# Patient Record
Sex: Female | Born: 1960 | Race: White | Hispanic: No | Marital: Single | Smoking: Current every day smoker
Health system: Southern US, Community
[De-identification: ages and names within clinical notes are randomized; demographics above are authoritative.]

## PROBLEM LIST (undated history)

## (undated) DIAGNOSIS — F319 Bipolar disorder, unspecified: Secondary | ICD-10-CM

## (undated) DIAGNOSIS — E119 Type 2 diabetes mellitus without complications: Secondary | ICD-10-CM

## (undated) DIAGNOSIS — F259 Schizoaffective disorder, unspecified: Secondary | ICD-10-CM

## (undated) DIAGNOSIS — F419 Anxiety disorder, unspecified: Secondary | ICD-10-CM

## (undated) DIAGNOSIS — J45909 Unspecified asthma, uncomplicated: Secondary | ICD-10-CM

## (undated) DIAGNOSIS — Z923 Personal history of irradiation: Secondary | ICD-10-CM

## (undated) DIAGNOSIS — N809 Endometriosis, unspecified: Secondary | ICD-10-CM

## (undated) DIAGNOSIS — E039 Hypothyroidism, unspecified: Secondary | ICD-10-CM

## (undated) DIAGNOSIS — K219 Gastro-esophageal reflux disease without esophagitis: Secondary | ICD-10-CM

## (undated) DIAGNOSIS — B159 Hepatitis A without hepatic coma: Secondary | ICD-10-CM

## (undated) DIAGNOSIS — J449 Chronic obstructive pulmonary disease, unspecified: Secondary | ICD-10-CM

## (undated) DIAGNOSIS — R7989 Other specified abnormal findings of blood chemistry: Secondary | ICD-10-CM

## (undated) DIAGNOSIS — F4310 Post-traumatic stress disorder, unspecified: Secondary | ICD-10-CM

## (undated) DIAGNOSIS — R7303 Prediabetes: Secondary | ICD-10-CM

## (undated) DIAGNOSIS — D649 Anemia, unspecified: Secondary | ICD-10-CM

## (undated) DIAGNOSIS — I10 Essential (primary) hypertension: Secondary | ICD-10-CM

## (undated) DIAGNOSIS — F909 Attention-deficit hyperactivity disorder, unspecified type: Secondary | ICD-10-CM

## (undated) HISTORY — DX: Type 2 diabetes mellitus without complications: E11.9

## (undated) HISTORY — DX: Anxiety disorder, unspecified: F41.9

## (undated) HISTORY — PX: ECTOPIC PREGNANCY SURGERY: SHX613

## (undated) HISTORY — PX: ABDOMINAL HYSTERECTOMY: SHX81

## (undated) HISTORY — DX: Unspecified asthma, uncomplicated: J45.909

## (undated) HISTORY — DX: Attention-deficit hyperactivity disorder, unspecified type: F90.9

## (undated) HISTORY — DX: Schizoaffective disorder, unspecified: F25.9

---

## 1988-04-06 HISTORY — PX: TOTAL ABDOMINAL HYSTERECTOMY W/ BILATERAL SALPINGOOPHORECTOMY: SHX83

## 1999-02-17 ENCOUNTER — Encounter: Admission: RE | Admit: 1999-02-17 | Discharge: 1999-02-17 | Payer: Self-pay | Admitting: Internal Medicine

## 1999-05-05 ENCOUNTER — Encounter: Admission: RE | Admit: 1999-05-05 | Discharge: 1999-05-05 | Payer: Self-pay | Admitting: Internal Medicine

## 1999-08-04 ENCOUNTER — Encounter: Admission: RE | Admit: 1999-08-04 | Discharge: 1999-08-04 | Payer: Self-pay | Admitting: Internal Medicine

## 1999-10-22 ENCOUNTER — Encounter: Admission: RE | Admit: 1999-10-22 | Discharge: 1999-10-22 | Payer: Self-pay | Admitting: Internal Medicine

## 1999-12-18 ENCOUNTER — Encounter: Admission: RE | Admit: 1999-12-18 | Discharge: 1999-12-18 | Payer: Self-pay | Admitting: Internal Medicine

## 1999-12-23 ENCOUNTER — Encounter: Admission: RE | Admit: 1999-12-23 | Discharge: 1999-12-23 | Payer: Self-pay | Admitting: Hematology and Oncology

## 2000-02-03 ENCOUNTER — Ambulatory Visit (HOSPITAL_COMMUNITY): Admission: RE | Admit: 2000-02-03 | Discharge: 2000-02-03 | Payer: Self-pay | Admitting: Hematology and Oncology

## 2000-03-03 ENCOUNTER — Encounter: Admission: RE | Admit: 2000-03-03 | Discharge: 2000-03-03 | Payer: Self-pay

## 2000-03-08 ENCOUNTER — Encounter: Admission: RE | Admit: 2000-03-08 | Discharge: 2000-03-08 | Payer: Self-pay | Admitting: Internal Medicine

## 2000-04-23 ENCOUNTER — Encounter: Admission: RE | Admit: 2000-04-23 | Discharge: 2000-04-23 | Payer: Self-pay

## 2000-05-12 ENCOUNTER — Encounter: Admission: RE | Admit: 2000-05-12 | Discharge: 2000-05-12 | Payer: Self-pay | Admitting: Hematology and Oncology

## 2000-12-07 ENCOUNTER — Encounter: Admission: RE | Admit: 2000-12-07 | Discharge: 2000-12-07 | Payer: Self-pay | Admitting: Internal Medicine

## 2001-01-20 ENCOUNTER — Encounter: Admission: RE | Admit: 2001-01-20 | Discharge: 2001-01-20 | Payer: Self-pay | Admitting: Internal Medicine

## 2001-10-15 ENCOUNTER — Encounter: Payer: Self-pay | Admitting: Emergency Medicine

## 2001-10-15 ENCOUNTER — Emergency Department (HOSPITAL_COMMUNITY): Admission: EM | Admit: 2001-10-15 | Discharge: 2001-10-15 | Payer: Self-pay | Admitting: Emergency Medicine

## 2001-11-25 ENCOUNTER — Encounter: Admission: RE | Admit: 2001-11-25 | Discharge: 2001-11-25 | Payer: Self-pay | Admitting: Internal Medicine

## 2001-12-02 ENCOUNTER — Encounter: Payer: Self-pay | Admitting: Internal Medicine

## 2001-12-02 ENCOUNTER — Encounter: Admission: RE | Admit: 2001-12-02 | Discharge: 2001-12-02 | Payer: Self-pay | Admitting: Internal Medicine

## 2002-01-03 ENCOUNTER — Encounter: Admission: RE | Admit: 2002-01-03 | Discharge: 2002-01-03 | Payer: Self-pay | Admitting: Internal Medicine

## 2002-03-13 ENCOUNTER — Encounter: Admission: RE | Admit: 2002-03-13 | Discharge: 2002-03-13 | Payer: Self-pay | Admitting: Internal Medicine

## 2002-04-27 ENCOUNTER — Encounter: Payer: Self-pay | Admitting: Internal Medicine

## 2002-04-27 ENCOUNTER — Encounter: Admission: RE | Admit: 2002-04-27 | Discharge: 2002-04-27 | Payer: Self-pay | Admitting: Internal Medicine

## 2002-04-27 ENCOUNTER — Ambulatory Visit (HOSPITAL_COMMUNITY): Admission: RE | Admit: 2002-04-27 | Discharge: 2002-04-27 | Payer: Self-pay | Admitting: Internal Medicine

## 2002-05-15 ENCOUNTER — Encounter: Admission: RE | Admit: 2002-05-15 | Discharge: 2002-05-15 | Payer: Self-pay | Admitting: Internal Medicine

## 2004-04-09 ENCOUNTER — Emergency Department: Payer: Self-pay | Admitting: Emergency Medicine

## 2004-09-11 ENCOUNTER — Emergency Department: Payer: Self-pay | Admitting: Emergency Medicine

## 2004-09-11 ENCOUNTER — Other Ambulatory Visit: Payer: Self-pay

## 2007-01-24 ENCOUNTER — Ambulatory Visit: Payer: Self-pay | Admitting: Cardiology

## 2007-02-08 ENCOUNTER — Ambulatory Visit: Payer: Self-pay | Admitting: Cardiology

## 2008-01-06 ENCOUNTER — Emergency Department: Payer: Self-pay | Admitting: Emergency Medicine

## 2009-07-14 ENCOUNTER — Emergency Department: Payer: Self-pay | Admitting: Internal Medicine

## 2009-12-31 ENCOUNTER — Inpatient Hospital Stay: Payer: Self-pay | Admitting: Psychiatry

## 2011-07-04 ENCOUNTER — Inpatient Hospital Stay: Payer: Self-pay | Admitting: Psychiatry

## 2011-07-04 LAB — COMPREHENSIVE METABOLIC PANEL
Albumin: 3.9 g/dL (ref 3.4–5.0)
Calcium, Total: 8.5 mg/dL (ref 8.5–10.1)
Co2: 27 mmol/L (ref 21–32)
Creatinine: 1 mg/dL (ref 0.60–1.30)
EGFR (African American): >60
EGFR (Non-African Amer.): >60
Glucose: 101 mg/dL — ABNORMAL HIGH (ref 65–99)
Osmolality: 279 (ref 275–301)
SGOT(AST): 28 U/L (ref 15–37)
Sodium: 139 mmol/L (ref 136–145)
Total Protein: 7.7 g/dL (ref 6.4–8.2)

## 2011-07-04 LAB — URINALYSIS, COMPLETE
Blood: NEGATIVE
Glucose,UR: NEGATIVE mg/dL (ref 0–75)
Ketone: NEGATIVE
Leukocyte Esterase: NEGATIVE
Nitrite: NEGATIVE
Ph: 7 (ref 4.5–8.0)
RBC,UR: 1 /HPF (ref 0–5)
Specific Gravity: 1.009 (ref 1.003–1.030)
WBC UR: NONE SEEN /HPF (ref 0–5)

## 2011-07-04 LAB — CBC
HCT: 40 % (ref 35.0–47.0)
MCH: 34.4 pg — ABNORMAL HIGH (ref 26.0–34.0)
MCHC: 33.1 g/dL (ref 32.0–36.0)
MCV: 104 fL — ABNORMAL HIGH (ref 80–100)
RBC: 3.85 10*6/uL (ref 3.80–5.20)
RDW: 13.1 % (ref 11.5–14.5)
WBC: 9 10*3/uL (ref 3.6–11.0)

## 2011-07-04 LAB — ACETAMINOPHEN LEVEL: Acetaminophen: <2 ug/mL

## 2011-07-04 LAB — DRUG SCREEN, URINE
Amphetamines, Ur Screen: NEGATIVE (ref ?–1000)
Cannabinoid 50 Ng, Ur ~~LOC~~: NEGATIVE (ref ?–50)
Cocaine Metabolite,Ur ~~LOC~~: NEGATIVE (ref ?–300)
Methadone, Ur Screen: NEGATIVE (ref ?–300)

## 2011-07-04 LAB — TSH: Thyroid Stimulating Horm: 4.64 u[IU]/mL — ABNORMAL HIGH

## 2011-09-03 ENCOUNTER — Ambulatory Visit: Payer: Self-pay | Admitting: Internal Medicine

## 2011-10-14 LAB — CBC
HCT: 33.5 % — ABNORMAL LOW (ref 35.0–47.0)
HGB: 11.2 g/dL — ABNORMAL LOW (ref 12.0–16.0)
MCH: 36.1 pg — ABNORMAL HIGH (ref 26.0–34.0)
MCHC: 33.4 g/dL (ref 32.0–36.0)
MCV: 108 fL — ABNORMAL HIGH (ref 80–100)
Platelet: 224 10*3/uL (ref 150–440)
RBC: 3.11 10*6/uL — ABNORMAL LOW (ref 3.80–5.20)
RDW: 13.5 % (ref 11.5–14.5)
WBC: 8 10*3/uL (ref 3.6–11.0)

## 2011-10-15 ENCOUNTER — Ambulatory Visit: Payer: Self-pay | Admitting: Obstetrics and Gynecology

## 2011-10-15 LAB — HEMOGLOBIN: HGB: 9.1 g/dL — ABNORMAL LOW (ref 12.0–16.0)

## 2011-10-15 LAB — HEMATOCRIT
HCT: 27.9 % — ABNORMAL LOW (ref 35.0–47.0)
HCT: 23.3 % — ABNORMAL LOW (ref 35.0–47.0)

## 2011-10-16 LAB — CBC WITH DIFFERENTIAL/PLATELET
Basophil #: 0 10*3/uL (ref 0.0–0.1)
Basophil %: 0.4 %
Eosinophil #: 1.2 10*3/uL — ABNORMAL HIGH (ref 0.0–0.7)
Eosinophil %: 13.6 %
HCT: 26.4 % — ABNORMAL LOW (ref 35.0–47.0)
HGB: 9.2 g/dL — ABNORMAL LOW (ref 12.0–16.0)
Lymphocyte #: 4.2 10*3/uL — ABNORMAL HIGH (ref 1.0–3.6)
Lymphocyte %: 46.8 %
MCH: 35.3 pg — ABNORMAL HIGH (ref 26.0–34.0)
MCHC: 34.9 g/dL (ref 32.0–36.0)
MCV: 102 fL — ABNORMAL HIGH (ref 80–100)
Monocyte #: 0.7 x10 3/mm (ref 0.2–0.9)
Monocyte %: 8.4 %
Neutrophil #: 2.7 10*3/uL (ref 1.4–6.5)
Neutrophil %: 30.8 %
Platelet: 134 10*3/uL — ABNORMAL LOW (ref 150–440)
RBC: 2.61 10*6/uL — ABNORMAL LOW (ref 3.80–5.20)
RDW: 16.8 % — ABNORMAL HIGH (ref 11.5–14.5)
WBC: 8.9 10*3/uL (ref 3.6–11.0)

## 2011-11-03 ENCOUNTER — Inpatient Hospital Stay: Payer: Self-pay | Admitting: Obstetrics and Gynecology

## 2011-11-03 LAB — URINALYSIS, COMPLETE
Bilirubin,UR: NEGATIVE
Glucose,UR: NEGATIVE mg/dL (ref 0–75)
Ketone: NEGATIVE
Leukocyte Esterase: NEGATIVE
Nitrite: NEGATIVE
Ph: 6 (ref 4.5–8.0)
RBC,UR: 8 /HPF (ref 0–5)
Squamous Epithelial: <1
WBC UR: 11 /HPF (ref 0–5)

## 2011-11-03 LAB — CBC WITH DIFFERENTIAL/PLATELET
Basophil #: 0.1 10*3/uL (ref 0.0–0.1)
Basophil %: 0.4 %
Eosinophil #: 0.2 10*3/uL (ref 0.0–0.7)
Eosinophil %: 1.3 %
HCT: 28.4 % — ABNORMAL LOW (ref 35.0–47.0)
HGB: 9.5 g/dL — ABNORMAL LOW (ref 12.0–16.0)
Lymphocyte #: 2.8 10*3/uL (ref 1.0–3.6)
Lymphocyte %: 19.2 %
MCH: 33.4 pg (ref 26.0–34.0)
MCHC: 33.3 g/dL (ref 32.0–36.0)
MCV: 100 fL (ref 80–100)
Monocyte #: 1.3 x10 3/mm — ABNORMAL HIGH (ref 0.2–0.9)
Monocyte %: 8.9 %
Neutrophil #: 10.2 10*3/uL — ABNORMAL HIGH (ref 1.4–6.5)
Neutrophil %: 70.2 %
Platelet: 368 10*3/uL (ref 150–440)
RBC: 2.83 10*6/uL — ABNORMAL LOW (ref 3.80–5.20)
RDW: 15.4 % — ABNORMAL HIGH (ref 11.5–14.5)
WBC: 14.6 10*3/uL — ABNORMAL HIGH (ref 3.6–11.0)

## 2011-11-03 LAB — COMPREHENSIVE METABOLIC PANEL
Albumin: 2.4 g/dL — ABNORMAL LOW (ref 3.4–5.0)
Alkaline Phosphatase: 270 U/L — ABNORMAL HIGH (ref 50–136)
Anion Gap: 6 — ABNORMAL LOW (ref 7–16)
BUN: 8 mg/dL (ref 7–18)
Bilirubin,Total: 0.3 mg/dL (ref 0.2–1.0)
Calcium, Total: 8.5 mg/dL (ref 8.5–10.1)
Chloride: 102 mmol/L (ref 98–107)
Co2: 30 mmol/L (ref 21–32)
Creatinine: 0.86 mg/dL (ref 0.60–1.30)
EGFR (African American): >60
EGFR (Non-African Amer.): >60
Glucose: 101 mg/dL — ABNORMAL HIGH (ref 65–99)
Osmolality: 274 (ref 275–301)
Potassium: 3.5 mmol/L (ref 3.5–5.1)
SGOT(AST): 49 U/L — ABNORMAL HIGH (ref 15–37)
SGPT (ALT): 83 U/L — ABNORMAL HIGH
Sodium: 138 mmol/L (ref 136–145)
Total Protein: 7 g/dL (ref 6.4–8.2)

## 2011-11-03 LAB — WET PREP, GENITAL

## 2011-11-03 LAB — TSH: Thyroid Stimulating Horm: 0.219 u[IU]/mL — ABNORMAL LOW

## 2011-11-03 LAB — PROTIME-INR: Prothrombin Time: 13.2 secs (ref 11.5–14.7)

## 2011-11-04 LAB — CBC WITH DIFFERENTIAL/PLATELET
Basophil #: 0.1 10*3/uL (ref 0.0–0.1)
Basophil %: 0.6 %
Eosinophil #: 0.4 10*3/uL (ref 0.0–0.7)
Eosinophil %: 4.2 %
HGB: 9.3 g/dL — ABNORMAL LOW (ref 12.0–16.0)
Lymphocyte %: 30.6 %
MCHC: 34.7 g/dL (ref 32.0–36.0)
Neutrophil %: 53.7 %
RBC: 2.66 10*6/uL — ABNORMAL LOW (ref 3.80–5.20)
RDW: 16 % — ABNORMAL HIGH (ref 11.5–14.5)
WBC: 9.7 10*3/uL (ref 3.6–11.0)

## 2011-11-05 LAB — COMPREHENSIVE METABOLIC PANEL
Alkaline Phosphatase: 222 U/L — ABNORMAL HIGH (ref 50–136)
Anion Gap: 8 (ref 7–16)
Bilirubin,Total: 0.2 mg/dL (ref 0.2–1.0)
Chloride: 103 mmol/L (ref 98–107)
Co2: 30 mmol/L (ref 21–32)
Creatinine: 0.96 mg/dL (ref 0.60–1.30)
EGFR (African American): >60
EGFR (Non-African Amer.): >60
Osmolality: 279 (ref 275–301)
Potassium: 3.8 mmol/L (ref 3.5–5.1)
Sodium: 141 mmol/L (ref 136–145)

## 2011-11-05 LAB — CBC WITH DIFFERENTIAL/PLATELET
Basophil #: 0 10*3/uL (ref 0.0–0.1)
Basophil %: 0.6 %
Eosinophil #: 0.5 10*3/uL (ref 0.0–0.7)
HCT: 25.6 % — ABNORMAL LOW (ref 35.0–47.0)
Lymphocyte #: 2.2 10*3/uL (ref 1.0–3.6)
Lymphocyte %: 28.2 %
MCHC: 34.8 g/dL (ref 32.0–36.0)
MCV: 101 fL — ABNORMAL HIGH (ref 80–100)
Monocyte #: 0.8 x10 3/mm (ref 0.2–0.9)
Monocyte %: 10.2 %
Neutrophil #: 4.3 10*3/uL (ref 1.4–6.5)
Platelet: 316 10*3/uL (ref 150–440)
RBC: 2.54 10*6/uL — ABNORMAL LOW (ref 3.80–5.20)
RDW: 15.7 % — ABNORMAL HIGH (ref 11.5–14.5)

## 2011-11-05 LAB — URINE CULTURE

## 2011-11-06 LAB — CBC WITH DIFFERENTIAL/PLATELET
Basophil #: 0 10*3/uL (ref 0.0–0.1)
Basophil %: 0.5 %
Eosinophil #: 0.7 10*3/uL (ref 0.0–0.7)
Lymphocyte #: 2.5 10*3/uL (ref 1.0–3.6)
MCH: 34 pg (ref 26.0–34.0)
MCHC: 33.7 g/dL (ref 32.0–36.0)
MCV: 101 fL — ABNORMAL HIGH (ref 80–100)
Monocyte %: 10.8 %
Neutrophil #: 3 10*3/uL (ref 1.4–6.5)
RBC: 2.64 10*6/uL — ABNORMAL LOW (ref 3.80–5.20)
RDW: 15.8 % — ABNORMAL HIGH (ref 11.5–14.5)
WBC: 6.9 10*3/uL (ref 3.6–11.0)

## 2011-11-07 LAB — CBC WITH DIFFERENTIAL/PLATELET
Basophil #: 0 10*3/uL (ref 0.0–0.1)
Basophil %: 0.6 %
Eosinophil #: 0.6 10*3/uL (ref 0.0–0.7)
HGB: 9 g/dL — ABNORMAL LOW (ref 12.0–16.0)
Lymphocyte #: 2.9 10*3/uL (ref 1.0–3.6)
Lymphocyte %: 39.4 %
MCHC: 34.1 g/dL (ref 32.0–36.0)
MCV: 101 fL — ABNORMAL HIGH (ref 80–100)
Neutrophil #: 2.9 10*3/uL (ref 1.4–6.5)
Neutrophil %: 39.7 %
Platelet: 353 10*3/uL (ref 150–440)
RDW: 15.7 % — ABNORMAL HIGH (ref 11.5–14.5)

## 2011-11-17 ENCOUNTER — Emergency Department: Payer: Self-pay | Admitting: Emergency Medicine

## 2013-02-06 ENCOUNTER — Encounter: Payer: Self-pay | Admitting: Family Medicine

## 2013-02-16 ENCOUNTER — Encounter: Payer: Self-pay | Admitting: Family Medicine

## 2013-03-06 ENCOUNTER — Encounter: Payer: Self-pay | Admitting: Family Medicine

## 2013-04-06 ENCOUNTER — Encounter: Payer: Self-pay | Admitting: Family Medicine

## 2013-05-30 ENCOUNTER — Encounter: Payer: Self-pay | Admitting: Family Medicine

## 2013-06-04 ENCOUNTER — Encounter: Payer: Self-pay | Admitting: Family Medicine

## 2013-07-05 ENCOUNTER — Encounter: Payer: Self-pay | Admitting: Family Medicine

## 2013-08-07 ENCOUNTER — Inpatient Hospital Stay: Payer: Self-pay | Admitting: Psychiatry

## 2013-08-07 LAB — DRUG SCREEN, URINE
AMPHETAMINES, UR SCREEN: NEGATIVE (ref ?–1000)
Barbiturates, Ur Screen: NEGATIVE (ref ?–200)
Benzodiazepine, Ur Scrn: NEGATIVE (ref ?–200)
COCAINE METABOLITE, UR ~~LOC~~: NEGATIVE (ref ?–300)
Cannabinoid 50 Ng, Ur ~~LOC~~: POSITIVE (ref ?–50)
MDMA (Ecstasy)Ur Screen: NEGATIVE (ref ?–500)
METHADONE, UR SCREEN: NEGATIVE (ref ?–300)
Opiate, Ur Screen: NEGATIVE (ref ?–300)
Phencyclidine (PCP) Ur S: NEGATIVE (ref ?–25)
TRICYCLIC, UR SCREEN: NEGATIVE (ref ?–1000)

## 2013-08-07 LAB — CBC
HCT: 33.8 % — ABNORMAL LOW (ref 35.0–47.0)
HGB: 11.5 g/dL — ABNORMAL LOW (ref 12.0–16.0)
MCH: 35.9 pg — ABNORMAL HIGH (ref 26.0–34.0)
MCHC: 34 g/dL (ref 32.0–36.0)
MCV: 106 fL — ABNORMAL HIGH (ref 80–100)
PLATELETS: 281 10*3/uL (ref 150–440)
RBC: 3.2 10*6/uL — AB (ref 3.80–5.20)
RDW: 13.1 % (ref 11.5–14.5)
WBC: 11.5 10*3/uL — AB (ref 3.6–11.0)

## 2013-08-07 LAB — URINALYSIS, COMPLETE
BILIRUBIN, UR: NEGATIVE
GLUCOSE, UR: NEGATIVE mg/dL (ref 0–75)
KETONE: NEGATIVE
NITRITE: POSITIVE
Ph: 5 (ref 4.5–8.0)
Protein: NEGATIVE
RBC,UR: 11 /HPF (ref 0–5)
SPECIFIC GRAVITY: 1.01 (ref 1.003–1.030)
Squamous Epithelial: 2
WBC UR: 101 /HPF (ref 0–5)

## 2013-08-07 LAB — COMPREHENSIVE METABOLIC PANEL
ALBUMIN: 2.8 g/dL — AB (ref 3.4–5.0)
AST: 13 U/L — AB (ref 15–37)
Alkaline Phosphatase: 97 U/L
Anion Gap: 4 — ABNORMAL LOW (ref 7–16)
BUN: 14 mg/dL (ref 7–18)
Bilirubin,Total: 0.5 mg/dL (ref 0.2–1.0)
CO2: 29 mmol/L (ref 21–32)
Calcium, Total: 8.6 mg/dL (ref 8.5–10.1)
Chloride: 102 mmol/L (ref 98–107)
Creatinine: 1.56 mg/dL — ABNORMAL HIGH (ref 0.60–1.30)
GFR CALC AF AMER: 43 — AB
GFR CALC NON AF AMER: 37 — AB
Glucose: 123 mg/dL — ABNORMAL HIGH (ref 65–99)
Osmolality: 272 (ref 275–301)
Potassium: 3.6 mmol/L (ref 3.5–5.1)
SGPT (ALT): 19 U/L (ref 12–78)
Sodium: 135 mmol/L — ABNORMAL LOW (ref 136–145)
Total Protein: 7 g/dL (ref 6.4–8.2)

## 2013-08-07 LAB — ETHANOL
Ethanol %: <0.003 % (ref 0.000–0.080)
Ethanol: <3 mg/dL

## 2013-08-07 LAB — BASIC METABOLIC PANEL
Anion Gap: 8 (ref 7–16)
BUN: 15 mg/dL (ref 7–18)
CREATININE: 1.52 mg/dL — AB (ref 0.60–1.30)
Calcium, Total: 8.2 mg/dL — ABNORMAL LOW (ref 8.5–10.1)
Chloride: 104 mmol/L (ref 98–107)
Co2: 27 mmol/L (ref 21–32)
EGFR (African American): 44 — ABNORMAL LOW
EGFR (Non-African Amer.): 38 — ABNORMAL LOW
GLUCOSE: 94 mg/dL (ref 65–99)
OSMOLALITY: 278 (ref 275–301)
Potassium: 3.8 mmol/L (ref 3.5–5.1)
Sodium: 139 mmol/L (ref 136–145)

## 2013-08-07 LAB — SALICYLATE LEVEL: SALICYLATES, SERUM: 2.7 mg/dL

## 2013-08-07 LAB — ACETAMINOPHEN LEVEL: Acetaminophen: <2 ug/mL

## 2013-08-10 LAB — URINE CULTURE

## 2013-08-12 LAB — URINALYSIS, COMPLETE
Bilirubin,UR: NEGATIVE
GLUCOSE, UR: NEGATIVE mg/dL (ref 0–75)
Ketone: NEGATIVE
Nitrite: NEGATIVE
PH: 6 (ref 4.5–8.0)
Protein: NEGATIVE
RBC,UR: 3 /HPF (ref 0–5)
SPECIFIC GRAVITY: 1.004 (ref 1.003–1.030)
WBC UR: 22 /HPF (ref 0–5)

## 2013-08-22 ENCOUNTER — Emergency Department: Payer: Self-pay | Admitting: Emergency Medicine

## 2013-09-06 DIAGNOSIS — F17200 Nicotine dependence, unspecified, uncomplicated: Secondary | ICD-10-CM | POA: Insufficient documentation

## 2013-09-06 DIAGNOSIS — I10 Essential (primary) hypertension: Secondary | ICD-10-CM | POA: Insufficient documentation

## 2013-09-06 DIAGNOSIS — I739 Peripheral vascular disease, unspecified: Secondary | ICD-10-CM | POA: Insufficient documentation

## 2013-09-06 DIAGNOSIS — F319 Bipolar disorder, unspecified: Secondary | ICD-10-CM | POA: Insufficient documentation

## 2013-11-28 ENCOUNTER — Observation Stay: Payer: Self-pay | Admitting: Internal Medicine

## 2013-11-28 LAB — COMPREHENSIVE METABOLIC PANEL
ALK PHOS: 131 U/L — AB
ALT: 32 U/L
AST: 34 U/L (ref 15–37)
Albumin: 3.2 g/dL — ABNORMAL LOW (ref 3.4–5.0)
Anion Gap: 7 (ref 7–16)
BUN: 16 mg/dL (ref 7–18)
Bilirubin,Total: 0.2 mg/dL (ref 0.2–1.0)
CREATININE: 1.39 mg/dL — AB (ref 0.60–1.30)
Calcium, Total: 8.2 mg/dL — ABNORMAL LOW (ref 8.5–10.1)
Chloride: 107 mmol/L (ref 98–107)
Co2: 26 mmol/L (ref 21–32)
EGFR (African American): 49 — ABNORMAL LOW
EGFR (Non-African Amer.): 43 — ABNORMAL LOW
GLUCOSE: 85 mg/dL (ref 65–99)
Osmolality: 280 (ref 275–301)
Potassium: 4 mmol/L (ref 3.5–5.1)
SODIUM: 140 mmol/L (ref 136–145)
TOTAL PROTEIN: 7.2 g/dL (ref 6.4–8.2)

## 2013-11-28 LAB — CBC WITH DIFFERENTIAL/PLATELET
Basophil #: 0.1 10*3/uL (ref 0.0–0.1)
Basophil %: 0.9 %
EOS ABS: 0.9 10*3/uL — AB (ref 0.0–0.7)
Eosinophil %: 10.9 %
HCT: 35.9 % (ref 35.0–47.0)
HGB: 12.1 g/dL (ref 12.0–16.0)
LYMPHS PCT: 50 %
Lymphocyte #: 4.3 10*3/uL — ABNORMAL HIGH (ref 1.0–3.6)
MCH: 36.4 pg — AB (ref 26.0–34.0)
MCHC: 33.8 g/dL (ref 32.0–36.0)
MCV: 108 fL — ABNORMAL HIGH (ref 80–100)
Monocyte #: 0.7 x10 3/mm (ref 0.2–0.9)
Monocyte %: 8.3 %
Neutrophil #: 2.6 10*3/uL (ref 1.4–6.5)
Neutrophil %: 29.9 %
PLATELETS: 239 10*3/uL (ref 150–440)
RBC: 3.34 10*6/uL — AB (ref 3.80–5.20)
RDW: 12.8 % (ref 11.5–14.5)
WBC: 8.6 10*3/uL (ref 3.6–11.0)

## 2013-11-29 LAB — BASIC METABOLIC PANEL
ANION GAP: 10 (ref 7–16)
BUN: 18 mg/dL (ref 7–18)
CHLORIDE: 105 mmol/L (ref 98–107)
CO2: 23 mmol/L (ref 21–32)
CREATININE: 1.46 mg/dL — AB (ref 0.60–1.30)
Calcium, Total: 8.6 mg/dL (ref 8.5–10.1)
GFR CALC AF AMER: 46 — AB
GFR CALC NON AF AMER: 40 — AB
Glucose: 165 mg/dL — ABNORMAL HIGH (ref 65–99)
OSMOLALITY: 281 (ref 275–301)
Potassium: 3.9 mmol/L (ref 3.5–5.1)
Sodium: 138 mmol/L (ref 136–145)

## 2013-11-29 LAB — CBC WITH DIFFERENTIAL/PLATELET
BASOS PCT: 0 %
Basophil #: 0 10*3/uL (ref 0.0–0.1)
EOS ABS: 0 10*3/uL (ref 0.0–0.7)
EOS PCT: 0 %
HCT: 37.5 % (ref 35.0–47.0)
HGB: 12.2 g/dL (ref 12.0–16.0)
Lymphocyte #: 1.4 10*3/uL (ref 1.0–3.6)
Lymphocyte %: 7.7 %
MCH: 35.7 pg — AB (ref 26.0–34.0)
MCHC: 32.7 g/dL (ref 32.0–36.0)
MCV: 109 fL — ABNORMAL HIGH (ref 80–100)
MONOS PCT: 1.6 %
Monocyte #: 0.3 x10 3/mm (ref 0.2–0.9)
NEUTROS ABS: 16.4 10*3/uL — AB (ref 1.4–6.5)
NEUTROS PCT: 90.7 %
PLATELETS: 253 10*3/uL (ref 150–440)
RBC: 3.43 10*6/uL — ABNORMAL LOW (ref 3.80–5.20)
RDW: 12.8 % (ref 11.5–14.5)
WBC: 18.1 10*3/uL — AB (ref 3.6–11.0)

## 2014-06-15 DIAGNOSIS — Z85820 Personal history of malignant melanoma of skin: Secondary | ICD-10-CM | POA: Insufficient documentation

## 2014-07-24 NOTE — Discharge Summary (Signed)
PATIENT NAME:  Patricia Good, Patricia Good MR#:  [id number] DATE OF BIRTH:  1961-03-09  DATE OF ADMISSION:  11/03/2011 DATE OF DISCHARGE:  11/07/2011  HOSPITAL COURSE: 54 year old gravida 1, para 1 patient admitted through the Emergency Department for an infected vaginal cuff abscess. The patient had a traumatic laceration encountered with intercourse two weeks previous with repair in the operating room. The patient presented to the Emergency Department with lower abdominal pain.  CT scan demonstrates a 7 x 6 cm vaginal cuff mass. The patient's white blood count was elevated and the patient had a fever to 102. The patient was admitted and started on Zosyn q. 6 hours. Hospital day #1 the patient was taken to the operating room and the mass was drained. At the time of operation it was felt that this mass was more consistent with hematoma with secondary infection. Foley bulb catheter was placed into the cavity and drained for the next two days. The patient defervesced after the procedure and the patient continued on IV Zosyn. The patient was afebrile for the next three days postoperative and white blood count normalized.  On the day of discharge, white blood count 7.3, hematocrit of 26.3, and platelets 353. The patient's pain dissipated. She was tolerating a regular diet on discharge. She will be discharged to home in good condition.   DISCHARGE MEDICATIONS:  1. Augmentin 875 mg 1 tablet twice a day for 10 days.  2. Septra DS 1 tablet twice a day for 10 days.   FOLLOWUP: The patient will follow up with Dr. Ouida Sills on August 13 or before if she develops fever or increasing abdominal pain.   ____________________________ Boykin Nearing, MD tjs:bjt D: 11/07/2011 07:57:37 ET T: 11/07/2011 14:40:49 ET JOB#: [id number]  cc: Boykin Nearing, MD, <Dictator> Boykin Nearing MD ELECTRONICALLY SIGNED 11/08/2011 12:31

## 2014-07-24 NOTE — Op Note (Signed)
PATIENT NAME:  Patricia Good, Patricia Good MR#:  [id number] DATE OF BIRTH:  June 14, 1960  DATE OF PROCEDURE:  11/04/2011  PREOPERATIVE DIAGNOSIS: Vaginal cuff abscess.   POSTOPERATIVE DIAGNOSIS: Infected vaginal cuff hematoma.   PROCEDURE: Incision and drainage of vaginal cuff hematoma.   SURGEON: Laverta Baltimore, MD  ANESTHESIA: General LMA.   INDICATIONS: This is a 54 year old gravida 1, para 0, a patient admitted from the Emergency Department with a vaginal cuff abscess status post traumatic laceration two weeks previously with repair. The patient was febrile and had elevated white blood count.   DESCRIPTION OF PROCEDURE: After adequate general LMA anesthesia, the patient was placed in the dorsal supine position with the legs in the candy-cane stirrups. Lower abdomen, perineum and vaginal prep was performed with Betadine. Straight catheter yielded 100 mL of clear urine. A bulging firm mass was noted at the tear repair site. An 18-gauge spinal needle was placed into the mass with 3 mL of non-clotted dark blood. This was submitted for aerobic and anaerobic cultures. Rectal examination was performed demonstrating the extent of the mass, an area just inferior to the repair site. The previous repair site was injected with 1% lidocaine with epinephrine. A stab incision was made with a 15 blade scalpel and Metzenbaum scissors advanced into the mass. A small amount of discharge resulted. Loculations were broken up as best as possible and a Foley catheter was advanced into the wall and balloon was inflated. This will be kept in for two days. There were no complications. Estimated blood loss 20 mL.           Intraoperative fluids 400 mL. The patient was taken to the recovery room in good condition. The patient did receive her Zosyn dose that  was previously scheduled at admission time.  ____________________________ Boykin Nearing, MD tjs:slb D: 11/04/2011 12:18:10 ET T: 11/04/2011 12:46:24  ET JOB#: [id number]  cc: Boykin Nearing, MD, <Dictator> Boykin Nearing MD ELECTRONICALLY SIGNED 11/06/2011 16:59

## 2014-07-28 NOTE — H&P (Signed)
PATIENT NAME:  Patricia Good, Patricia Good MR#:  [id number] DATE OF BIRTH:  29-Jul-1960  DATE OF ADMISSION:  11/28/2013  REFERRING PHYSICIAN: Marjean Donna, MD   PRIMARY CARE PHYSICIAN: Dr. Rutherford Guys at [facility]: Tongue swelling, shortness of breath, dysphagia.   HISTORY OF PRESENT ILLNESS: This is a 54 year old female with known history of COPD, hypothyroidism, tobacco abuse, depression and hypertension who presents with the above-mentioned complaint. The patient reports she was at her usual state of health and yesterday evening around 8:00 she started to develop tongue swelling, shortness of breath and difficulty swallowing as well. Reports that she did wait for a couple hours to see if it resolves by itself where it did not an she called EMS. By presentation, the patient was noticed to have significant tongue swelling where she was given IV Solu-Medrol and IV Pepcid in the ED. The patient has significant improvement in her tongue swelling but it still persisted and she still reports some mild shortness of breath with this. She is saturating well on room air at this point. The patient is on lisinopril. Reports she was started on it last year, she never had any problems with it. She denies any cough, any productive sputum, any fever, chest pain, shortness of breath. Denies starting any new medications recently. The patient does not have any lip swelling. It was only tongue swelling.   PAST MEDICAL HISTORY: Hypertension, depression, COPD, hypothyroidism.   PAST SURGICAL HISTORY: Hysterectomy surgery.   SOCIAL HISTORY: The patient smokes 1/2 pack per day. Lives at home. No alcohol. No illicit drug use.   FAMILY HISTORY: Significant for diabetes and hypertension in her father.   ALLERGIES: SULFA DRUGS AND WE WILL ADD ACE INHIBITOR to her allergies.   HOME MEDICATIONS: 1.  Naproxen 500 mg oral 2 times a day. 2.  Tramadol 50 mg every 6 hours as needed. 3.  Zestril 40 mg oral  daily. 4.  Prozac 60 mg oral daily. 5.  Trazodone 100 mg oral at bedtime.  6.  Abilify 20 mg oral daily. 7.  Valtrex 500 mg oral daily. 8.  Albuterol as needed.  9.  Spiriva 18 mcg inhalation daily. 10.  Q-Var 1 puff b.i.d. 11.  Norvasc 10 mg oral daily. 12.  Synthroid 137 mcg oral daily.   REVIEW OF SYSTEMS:  CONSTITUTIONAL: Denies fever, chills, fatigue, weakness.  EYES: Denies blurry vision, double vision or inflammation.  EARS, NOSE, THROAT: Denies tinnitus, ear pain, hearing loss, epistaxis. The patient reports tongue swelling. RESPIRATORY: Denies cough, wheezing, hemoptysis. Reports shortness of breath.  CARDIOVASCULAR: Denies any chest pain, orthopnea, edema, palpitations.  GASTROINTESTINAL: Denies nausea, vomiting, diarrhea, abdominal pain, hematemesis.  GENITOURINARY: Denies dysuria, hematuria, renal colic.  ENDOCRINE: Denies polyuria, polydipsia, heat or cold intolerance.  HEMATOLOGY: Denies anemia, easy bruising, bleeding diathesis.  INTEGUMENT: Denies acne, rash, or skin lesion.  MUSCULOSKELETAL: Denies any gout, cramps, arthritis. NEUROLOGIC: Denies CVA, TIA, vertigo, tremors, ataxia.  PSYCHIATRIC: Denies insomnia, substance abuse, alcohol abuse. Reports history of depression and bipolar disorder.   PHYSICAL EXAMINATION: VITAL SIGNS: Temperature 98.2, pulse 68, respiratory rate 18, blood pressure 112/51, saturating 97% on oxygen.  GENERAL: Well-nourished female who looks comfortable in bed, in no apparent distress.  HEENT: Head atraumatic, normocephalic. Pupils equal and reactive to light. Pink conjunctivae. Anicteric sclerae. No nasal discharge or bleed. Has moderate tongue swelling, but I cannot see the back of her throat. There is no pharyngeal erythema. No oral thrush.  NECK: Supple. No thyromegaly.  No JVD. Trachea is midline. No lymphadenopathy. No stridor. CHEST: No chest pain on palpation.  LUNGS: Good air entry bilaterally. No wheezing, rales, rhonchi. No use of  accessory muscles.  HEART: S1, S2 heard. No rubs, murmurs or gallops. PMI nondisplaced.  ABDOMEN: Soft, nontender, nondistended. Bowel sounds present. No rebound. No guarding.  EXTREMITIES: No edema. No clubbing. No cyanosis. Pedal pulses +2 bilaterally.  PSYCHIATRIC: Appropriate affect. Awake and alert x3. Intact judgment and insight.  NEUROLOGIC: Cranial nerves grossly intact. Motor 5/5. No focal deficits. Sensation is symmetrical and intact to light touch.  MUSCULOSKELETAL: No joint effusion or erythema.   PERTINENT DIAGNOSTIC DATA: Glucose 85, BUN 16, creatinine 1.39, sodium 140, potassium 4, chloride 107, CO2 26, ALT 32, AST 34, alk phos 131. White blood cell 8.6, hemoglobin 12.1, hematocrit 35.9, platelets 239,000.   ASSESSMENT AND PLAN: 1.  Acute angioedema. The patient will be started on IV Solu-Medrol and IV Benadryl. We will monitor on telemetry. Will keep on continuous pulse oximetry. She will be kept n.p.o., except medications, until her swelling improves. We will add ACE inhibitor to her allergies. The patient was instructed not to take any ACE inhibitors or any ARBs.  2.  Hypertension. Will resume her back on her home medication, but will hold her lisinopril. 3.  Tobacco abuse. The patient was counseled.  4.  Chronic obstructive pulmonary disease. Has no active wheezing. We will continue her back on her home medication.  5.  Depression. Resume back on home medications.  6.  Hypothyroidism. Continue on Synthroid. 7.  Deep vein thrombosis prophylaxis. Subcutaneous heparin.   CODE STATUS: FULL code.   TOTAL TIME SPENT ON ADMISSION AND PATIENT CARE: 55 minutes. ____________________________ Albertine Patricia, MD dse:sb D: 11/28/2013 04:16:02 ET T: 11/28/2013 07:34:25 ET JOB#: [id number]  cc: Albertine Patricia, MD, <Dictator> Asusena Sigley Graciela Husbands MD ELECTRONICALLY SIGNED 11/30/2013 0:46

## 2014-07-28 NOTE — H&P (Signed)
PATIENT NAME:  Patricia Good, CULLIFER MR#:  [id number] DATE OF BIRTH:  03/09/1961  DATE OF ADMISSION:  08/07/2013  REFERRING PHYSICIAN: Emergency Room MD   ATTENDING PHYSICIAN: Orson Slick, MD  IDENTIFYING DATA: The patient is a 54 year old female with history of mood instability and psychosis.   CHIEF COMPLAINT: "I have blades in my stomach."   HISTORY OF PRESENT ILLNESS:  The patient has a long history of mental illness. She has been hospitalized numerous times since the age of 31.  This is her [facility] admission.  She usually arrives in the hospital complaining of physical symptoms to later be realized that they are delusions. The patient has belief that something is wrong with her stomach.  It has a very thin lining and there are some blades that she swallowed many years ago accidentally hurting her even more. When well, she can function independently. When she becomes psychotic, she usually ends up in the hospital unable to take care of herself, frightened, and she frequently has feelings of [facility] overcoming her body. She hears hallucinations but usually is unable to make the words out and her decompensation is almost always under stress. She has been a patient of Dr. Timmothy Euler for numerous years now and has been compliant with medication. She thinks that medicines are working well. She denies an inconsistency or medication changes. She denies substance abuse. She believes that recent relocation to a rooming house created a stress that is unbearable, and not long ago, she used to live in a small independent apartment but was unable to afford it. There is some conflict at the rooming house with other residents. She thinks that they behave strangely.  She reportedly went to court on the day of admission to press charges against one of the residents. In the Emergency Room, she was rather disorganized and made very little sense talking about her neighbors as she believe that  there are spirits in the house and in general does not want to return there. She is thinking of being placed in an assisted living facility, but she requires that this is a place where she can come and go at will and will not be bothered.  She endorses some symptoms of depression with poor sleep, anhedonia, feelings of guilt, hopelessness, worthlessness, poor energy and concentration. At times, however, she feels hyper with racing thoughts, pressured speech, and religious preoccupations. She denies any recent substance use.   PAST PSYCHIATRIC HISTORY: First hospitalization at the age of 76. She was hospitalized numerous times at [facility] while younger. She usually is somatic when admitted to the hospital and oftentimes hyperreligious. There was a period of time when she abused drugs but does not do it anymore. She is uncertain of a suicide attempt, but per her chart, she overdosed on pills several times in the past. She is a patient of Dr. Timmothy Euler at [place]. She is very satisfied with the care she is getting there.   FAMILY PSYCHIATRIC HISTORY: Has an aunt with diagnosis of bipolar disorder.   PAST MEDICAL HISTORY: COPD, hypothyroidism, a UTI, hypertension, GERD, osteoporosis with bone pain.   ALLERGIES: SULFA DRUGS.   MEDICATIONS ON ADMISSION: Valtrex 500 mg daily, Protonix 40 mg daily, lisinopril 40 mg daily, Norvasc 10 mg daily, trazodone 100 mg at bedtime, Spiriva daily, Synthroid 0.125 mg daily, Prozac 60 mg daily, Cipro 250 q. 12 hours for 3 days, Abilify 20 mg at bedtime, albuterol inhaler as needed.   SOCIAL HISTORY:  She dropped out in 10th grade. She is originally from [place]. She got her GED. She has been married 5 or 6 times. She relocated to [facility] 3-4 years ago. 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 Her mother lives here, sometimes she stays with the mother, lives independently, or in a rooming house. She is disabled. She has  Medicare. She applied for Medicaid and is hoping to get it shortly.  She would like to be placed in an assisted living facility.   REVIEW OF SYSTEMS:  CONSTITUTIONAL: No fevers or chills. No weight changes.  EYES: No double or blurred vision.  EARS, NOSE, AND THROAT:  No hearing loss.  RESPIRATORY: No shortness of breath or cough.  CARDIOVASCULAR: No chest pain or orthopnea.  GASTROINTESTINAL: No abdominal pain, nausea, vomiting, or diarrhea.  GENITOURINARY: No incontinence or frequency.  ENDOCRINE: No heat or cold intolerance.  LYMPHATIC: No anemia or easy bruising.  INTEGUMENTARY: No acne or rash.  MUSCULOSKELETAL: No muscle or joint pain.  NEUROLOGIC: No tingling or weakness.   PSYCHIATRIC: See history of present illness for details.   PHYSICAL EXAMINATION:  VITAL SIGNS: Blood pressure 127/73, pulse 70, respirations 20, temperature 98.6.  GENERAL: This is a well-developed female in no acute distress.  HEENT: The pupils are equal, round, and reactive to light. Sclerae anicteric.  NECK: Supple. No thyromegaly.  LUNGS: Clear to auscultation. No dullness to percussion.  HEART: Regular rhythm and rate. No murmurs, rubs, or gallops.  ABDOMEN: Soft, nontender, nondistended. Positive bowel sounds.  MUSCULOSKELETAL: Normal muscle strength in all extremities.  SKIN: No rashes or bruises.  LYMPHATIC: No cervical adenopathy.  NEUROLOGIC: Cranial nerves II-XII are intact.   LABORATORY DATA: Chemistries are within normal limits with elevated creatinine of 1.56 and blood alcohol level zero. LFTs within normal limits. Urine toxicology screen positive for cannabinoids. CBC: White blood count 11.5, hemoglobin 11.5, hematocrit 33.8, platelets 281,000.  Urinalysis is suggestive of urinary tract infection with a positive urine culture. Serum acetaminophen and salicylates are low.   MENTAL STATUS EXAMINATION ON ADMISSION: The patient is alert and oriented to person, place, time, and somewhat to  situation. She came to the Emergency Room with physical complaints and feeling dehydrated, which she indeed was, and hurting from osteoporosis and blades in her stomach. She accepted transfer to psychiatric bay readily. She recognized me from previous admissions. She is pleasant, polite and cooperative. She maintains good eye contact. She is well groomed and casually dressed. Her speech is of normal rhythm, rate, and volume. Mood is depressed with labile affect. Thought process is illogical. She is paranoid and delusional. She is hallucinating. Her cognition is grossly intact but difficult to assess today. She is of normal intelligence and normal fund of knowledge.  She can register 3/3 and recall 2/3 objects after 5 minutes. She is a poor historian as her insight and judgment are questionable.   SUICIDE RISK ASSESSMENT ON ADMISSION: This is a patient with a long history of depression, psychosis, mood instability, and suicide attempts, who came to the hospital psychotic and in the context of new social stressors. She is at increased risk of suicide.   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  AXIS II: Deferred.  AXIS III: History of COPD, hypertension, osteoporosis.  AXIS IV: Mental and physical illness, financial, and conflict with neighbors.  AXIS V: Global assessment of functioning 25.   PLAN: The patient was  admitted to [facility] for safety, stabilization, and medication management. She was initially placed on suicide precautions and was closely monitored for any unsafe behaviors. She underwent full psychiatric assessment. She received pharmacotherapy, individual and group psychotherapy, substance abuse counseling, and support from therapeutic milieu.  1. Suicidal ideation: The patient denies.  2. Mood and psychosis: We will continue her current regimen. For now, I will contact Dr. Timmothy Euler.  3. Medical: We will  continue antihypertensives, Synthroid, and inhalers.   DISPOSITION: It is unclear if the patient can be placed. She has not enough resources. We will order [facility] consultation.     ____________________________ Wardell Honour Bary Leriche, MD jbp:dd D: 08/08/2013 17:44:07 ET T: 08/08/2013 18:15:52 ET JOB#: [id number]  cc: Leory Allinson B. Bary Leriche, MD, <Dictator> Clovis Fredrickson MD ELECTRONICALLY SIGNED 08/31/2013 7:18

## 2014-07-28 NOTE — Discharge Summary (Signed)
PATIENT NAME:  Patricia Good, Patricia Good MR#:  [id number] DATE OF BIRTH:  [birth year]  DATE OF ADMISSION:  11/28/2013 DATE OF DISCHARGE:  11/29/2013  PRIMARY CARE PHYSICIAN: Abby D. Bender MD  FINAL DIAGNOSES:  1.  Angioedema.  2.  Hypertension.  3.  Chronic obstructive pulmonary disease.  4.  Chronic kidney disease.  5.  Depression.  6.  Gastroesophageal reflux disease.   MEDICATIONS ON DISCHARGE: Include trazodone 100 mg at bedtime, Abilify 20 mg daily, Valtrex 500 mg daily, ProAir HFA 2 puffs every 4 hours as needed for shortness of breath, Spiriva 1 inhalation daily, Norvasc 10 mg daily, ferrous fumarate 65 mg daily, Prilosec 40 mg daily, Qvar 80 mcg/inhalation 1 puff twice a day, Prozac 60 mg daily, Synthroid 137 mcg daily, prednisone 10 mg start with 60 and taper by 10 daily until complete, Benadryl as needed for itching and allergic reaction. Stop taking Zestril. Stop taking tramadol. Stop taking Naprosyn.   DIET: Low sodium diet, regular consistency.  FOLLOWUP: One to 2 weeks with Dr. Rutherford Guys.   HOSPITAL COURSE: The patient was admitted as an observation 11/28/2013 and discharged 11/29/2013. Came in with swelling of the tongue, some shortness of breath and dysphagia, was admitted with angioedema, started on IV Solu-Medrol and IV Benadryl.   LABORATORY AND RADIOLOGICAL DATA DURING THE HOSPITAL COURSE: Included a glucose of 85, BUN 16, creatinine 1.39, sodium 140, potassium 4.0, chloride 107, CO2 of 26, calcium 8.2. Liver function tests: Alkaline phosphatase 131, ALT 32, AST 34. White blood cell count 8.6, H and H 12.1 and 35.9, platelet count of 239,000. On discharge, white count 18.1, hemoglobin 12.2, platelet count 253,000. Creatinine 1.46   HOSPITAL COURSE PER PROBLEM LIST:  1.  Angioedema: This had improved. Tongue swelling had improved. No problems breathing or swallowing on discharge. Prednisone taper. I believe this is probably secondary to the lisinopril, classic angioedema with the  ACE inhibitors. DO NOT TAKE ACE INHIBITORS, THIS WAS LISTED AS AN ALLERGY. It could also be secondary to the recently started tramadol and Naprosyn, I did stop both of these medications also. The patient was much improved upon discharge, stable for discharge.  2.  Hypertension: Kept on usual medications minus lisinopril. Blood pressure 146/86 upon discharge.  3.  Chronic obstructive pulmonary disease: The patient did have some wheezing. Tobacco abuse, smoking cessation counseling done 3 minutes by me. Prednisone should help out with the wheezing.  4.  Chronic kidney disease stage 3: No improvement with IV fluid hydration.  5.  Depression: Continue all psychiatric medications.  6.  Gastroesophageal reflux disease: On Prilosec.  7.  Hypothyroidism: On Synthroid.  TIME SPENT ON DISCHARGE: Thirty-five minutes.   ____________________________ Tana Conch. Leslye Peer, MD rjw:TT D: 11/29/2013 15:01:53 ET T: 11/29/2013 23:20:17 ET JOB#: [id number]  cc: Tana Conch. Leslye Peer, MD, <Dictator> Abby D. Rebeca Alert MD Marisue Brooklyn MD ELECTRONICALLY SIGNED 12/08/2013 15:08

## 2014-07-28 NOTE — Consult Note (Signed)
Brief Consult Note: Diagnosis: Schizoaffective disorder bipolar type.   Patient was seen by consultant.   Recommend further assessment or treatment.   Orders entered.   Comments: Will admit to psychiatry.  Electronic Signatures: Orson Slick (MD)  (Signed [phone] 20:54)  Authored: Brief Consult Note   Last Updated: 04-May-15 20:54 by Orson Slick (MD)

## 2014-07-28 NOTE — Discharge Summary (Signed)
PATIENT NAME:  Patricia Good, Patricia Good MR#:  [id number] DATE OF BIRTH:  October 28, 1960  DATE OF ADMISSION:  11/28/2013 DATE OF DISCHARGE:  11/29/2013  PRIMARY CARE PHYSICIAN:  Mliss Fritz MD   FINAL DIAGNOSES: 1.  Angioedema.  2.  Hypertension.  3.  Chronic obstructive pulmonary disease.  4.  Chronic kidney disease.  5.  Depression.  6.  Gastroesophageal reflux disease.  7.  Tobacco abuse.   MEDICATIONS ON DISCHARGE: Include trazodone 100 mg at bedtime for insomnia, Abilify 20 mg daily, Valtrex 500 mg once a day to prevent outbreak, ProAir HFA 2 puffs every 4 hours as needed for shortness of breath, Spiriva 1 inhalation daily, Norvasc 10 mg daily, ferrous fumarate, 65 mg once a day.    ____________________________ Tana Conch. Leslye Peer, MD rjw:nt D: 11/29/2013 14:25:10 ET T: 11/29/2013 23:21:22 ET JOB#: [id number]  cc: Tana Conch. Leslye Peer, MD, <Dictator> Abby D. Rebeca Alert MD Marisue Brooklyn MD ELECTRONICALLY SIGNED 12/08/2013 15:08

## 2014-07-29 NOTE — Discharge Summary (Signed)
PATIENT NAME:  Patricia Good, Patricia Good MR#:  [id number] DATE OF BIRTH:  03/07/1961  DATE OF ADMISSION:  07/04/2011 DATE OF DISCHARGE:  07/07/2011  HOSPITAL COURSE: See dictated history and physical for details of admission. This 54 year old woman with a past history of psychotic disorder was admitted through the Emergency Room where she presented with somatic complaints about her eyes. It did not appear that there was necessarily anything wrong with her eyes but because of her history of psychosis, she was referred to psychiatry and admitted. In the time that I saw her she was continued on her usual outpatient psychiatric medicine. She denied to me that she was having any auditory or visual hallucinations. Did not appear to be bizarre or disorganized. Affect and mood were stable. Denies any suicidal or homicidal ideation. Generally appeared to be at her baseline. Tolerating medications well. The patient was continued on medication and we made sure that she had follow-up arranged in the community. She was engaged in groups and activities and participated appropriately. Insight seemed adequate. She was discharged without any complication with an agreement that she follow up in the community for outpatient mental health. She resolved any anxiety that she had about the place that she was living. She agreed that she would go back home to stay with her mother temporarily. It appeared that perhaps the living situation she had been in might not have been very stable and this may have been a reason for her being a little bit disorganized in the Emergency Room.   LABORATORY RESULTS: Urinalysis unremarkable. Drug screen negative. TSH slightly elevated at 4.64. Salicylates elevated at 5.2, MCV elevated at 104 but red blood cell count, hemoglobin and hematocrit all normal as is the white blood cell count. Alcohol undetectable. Chemistry panel just showed a glucose of 101.   DISCHARGE MEDICATIONS:  1. Polytrim ophthalmic drops one  both eyes q.3h. for infection. 2. Prozac 60 mg a day.  3. Zestril 20 mg b.i.d.  4. Zantac 150 mg twice a day.  5. Nexium 40 mg per day.  6. Combivent inhaler 2 puffs every 12 hours with spacer. 7. Valtrex 500 mg every 12 hours.  8. Abilify 10 mg per day.  9. Synthroid 150 mcg per day.  10. Desyrel 100 mg at night.   MENTAL STATUS EXAM: Neatly dressed and groomed. Good eye contact. Normal psychomotor activity. Slightly blunted affect, but not bizarrely so. Mood stated as fine. Thoughts appear lucid and directed. No evidence of loosening of associations. No delusional thinking evident. Denies any suicidal or homicidal ideation and reports optimism about her discharge plan and living situation and agrees to follow-up in the community.   DISPOSITION: Follow-up in the community with outpatient psychiatric provider as previously.   DIAGNOSES PRINCIPLE AND PRIMARY:  AXIS I: Psychosis, not otherwise specified, rule out paranoid schizophrenia.   SECONDARY DIAGNOSES:  AXIS I: No further.   AXIS II: Deferred.   AXIS III: High blood pressure, hypothyroid, herpes, gastric reflux symptoms, conjunctivitis.   AXIS IV: Moderate to severe from needing a new living situation, some limitation to her support in the community.   AXIS V: Functioning at time of discharge 42.   ____________________________ Gonzella Lex, MD jtc:rbg D: 07/20/2011 13:09:36 ET T: 07/20/2011 15:23:44 ET JOB#: [id number]  cc: Gonzella Lex, MD, <Dictator> Gonzella Lex MD ELECTRONICALLY SIGNED 07/21/2011 11:45

## 2014-07-29 NOTE — H&P (Signed)
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 The patient is separated from her sixth marriage. She has been married since October 2012. The patient has been living at a rooming house for the past two weeks and prior to that she lived with her mother. The patient comes for inpatient hospitalization on psychiatry at [facility] with chief complaint "my eyes are itching, my right eye is bleeding, I have an infection, I need help and so I brought myself to the Emergency Room".  HISTORY OF PRESENT ILLNESS: According to information obtained from the chart, the patient has  redness of eyes from constant rubbing and likely with polyethylene and it is moderately cleared. It was thought in the Emergency Room that the patient was having hallucinations though patient denies hearing voices or seeing things and said she has not been hearing voices or seeing things now but she did so in the past.   When the patient was asked when she last felt well, she reported two weeks ago. She has been living with her mother. She has constant conflicts with her mother and she moved in at the rooming house. However, she believes that other people at the rooming house are using crack and she got scared and she realized that she needs help and so she came for help.   PAST PSYCHIATRIC HISTORY: History of inpatient hospitalization on psychiatry about four or five times so far. First inpatient hospitalization on psychiatry was at age 76 years at [facility]. She was inpatient for bizarre behavior of hearing voices and seeing things. She was inpatient for seven days. Longest period of inpatient hospitalization on psychiatry was for two to three months at [facility] for hearing voices and  seeing things. Last inpatient hospitalization on psychiatry was at [facility] from 12/31/2009 to 01/06/2010 when she was admitted for depression and psychosis. The patient reports that she tried to hurt herself by taking overdose on pills on a few occasions. Currently she is being followed by Dr. Lenn Cal at [place]. Last appointment was sometime ago; next appointment is 07/10/2011.   The patient has been discharged on the following medications on 01/06/2010 from [place]:  1. Synthroid 0.125 mg daily. 2. Effexor XR 150 mg daily. 3. Zantac 150 mg twice a day. 4. Navane 2 mg twice a day.  She admits that she's been compliant with these medications and takes medications as prescribed.   FAMILY HISTORY OF MENTAL ILLNESS: Has an aunt who was diagnosed with bipolar disorder. No known history of suicides in the family.  FAMILY HISTORY: Raised by parents. Father was a truck Geophysicist/field seismologist. Father died at age 29. The patient does not know the cause of death. Mother was a stay at home mother. Mother is living and 65 years old. Has two sisters and one brother. Close to one sister and one brother and talks to them three times a week.  PERSONAL HISTORY: Born in [place], [place]. Moved to [place] two years ago. Dropped out in the 10th grade because of personal problems. Got GED later. Went to [employer] for CNA course and she got her certificate.   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 This job lasted for 1-1/2 years. Quit because she had to move to help her brother and sister-in-law. 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 This was three years ago. Quit this job because of back problems and hurting too much with osteoarthritis.   MILITARY HISTORY: None.  MARRIAGES: Married six times. Longest marriage was 1-1/2 years. All the marriages ended because of infidelity on the part of  her husbands. No children.   ALCOHOL AND DRUGS: Denies drinking alcohol. Denies street or prescription drug abuse. Does admit smoking nicotine cigarettes at a rate of 10 to 15 per day for many years.   MEDICAL HISTORY: Has hypertension. No known history of diabetes mellitus. Status post hysterectomy. Status post oophorectomy. Status post surgery for melanoma on the skin of the face. No history of motor vehicle accident. Never been unconscious. Has hypothyroidism and is on medication for the same.   ALLERGIES: No known drug allergies.  PRIMARY CARE PHYSICIAN: She is being followed at [facility] and last appointment was a month ago. Next appointment is coming up in one week.   PHYSICAL EXAMINATION:  VITAL SIGNS: Temperature 96.2, pulse 72 per minute and regular, respiratory rate 18 per minute and regular, blood pressure 160/80 mmHg.  HEENT: Head is normocephalic and atraumatic. Eyes are red, superficial abrasions, injection. There is a corneal abrasion noted. Eyes appear injected. EOMS visualized. Tympanic membranes visualized. Pupils equal, round, and reactive to light and accommodation. Fundi bilaterally benign.   NECK: Supple without any organomegaly or thyromegaly.  CHEST: Normal expansion. Normal breath sounds heard.  HEART: Normal S1, S2 without any murmurs or gallops.  ABDOMEN: Soft. No organomegaly. Bowel sounds heard.   RECTAL AND PELVIC: Deferred.   NEUROLOGIC: Gait is normal. Romberg negative. Cranial nerves II through XII grossly intact. Deep tendon reflexes 2+. Plantars are normal response.   MENTAL STATUS EXAMINATION: The patient is dressed in hospital clothes. Alert and oriented to place, person, and time. Fully aware of the situation that brought her for inpatient at [facility]. Denies feeling depressed and sad, "not now". Denies feeling hopeless, helpless. Denies feeling worthless or useless. Does admit that sometimes she feels life is not worth living because of her living  condition which is not very helpful and she wishes she was in a different situation. No auditory or visual hallucinations but she said that in the past she did hear voices and saw things. Denies paranoid or suspicious ideas except that people that she lives with at the rooming house use crack and this really bothers her a great deal. Memory is intact for recent and remote events. General knowledge and information is fair for level of education. Could spell the word world forward and backward without any problems. She could count money. Recall is good and she could remember all the three objects asked for one and three minutes. Does admit to sleep and appetite disturbance because of the living situation. Insight and judgment guarded.  IMPRESSION: AXIS I: 1. 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 2. Nicotine dependence.  AXIS II: Personality disorder, not otherwise specified.   AXIS III: 1. Hypothyroidism. 2. Status post hysterectomy. 3. Status post oophorectomy. 4. Status post surgery on the face for melanoma. 5. Rule out conjunctivitis with corneal abrasion.  AXIS IV: Severe. Living situation not helpful, occupational, financial, conflicts with mother with whom she has been living, very poor family support, very  poor social support.  AXIS V: GAF 25.  PLAN: The patient is admitted to [facility] for close observation, evaluation, and help. She will be started back on the same medications. During the stay in the hospital, her medications will be adjusted so that she will not be depressed and will not be paranoid. She has already started on medication for her corneal abrasion and hopefully this will start healing. If not, then ophthalmic consult will be requested. During the stay in the hospital, she will be given milieu therapy and supportive counseling. She will take part in individual and group therapy when she is ready to do so at which time coping  skills will be addressed. At the time of discharge, Social Services will look into appropriate living arrangement so that she does not decompensate. Appropriate follow-up appointments will be made.   ____________________________ Wallace Cullens. Franchot Mimes, MD skc:drc D: 07/05/2011 19:22:21 ET T: 07/06/2011 06:06:46 ET JOB#: [id number]  cc: Arlyn Leak K. Franchot Mimes, MD, <Dictator> Dewain Penning MD ELECTRONICALLY SIGNED 07/11/2011 21:54

## 2014-07-29 NOTE — Discharge Summary (Signed)
PATIENT NAME:  Patricia Good, Patricia Good MR#:  [id number] DATE OF BIRTH:  10-05-1960  DATE OF ADMISSION:  10/15/2011 DATE OF DISCHARGE:  10/16/2011  PRINCIPLE  PROCEDURES:  1. Repair of traumatic vaginal laceration. 2. Two-unit blood transfusion.   HOSPITAL COURSE: The patient underwent the above procedure, which was uncomplicated. Given the amount of blood that she lost preceding, the patient was symptomatic on the day of surgery with hematocrit of 23.3%. The patient was given 2 units of blood transfusion. Postoperative transfusion hematocrit of 26.4 with platelets 134. The patient is discharged to home with improved symptoms without complaints, ambulating without difficulty. The patient will follow up with Dr. Ouida Sills in four weeks.  She will go home on Ferro-Sequel, 1 tablet twice a day.    ____________________________ Boykin Nearing, MD tjs:bjt D: 10/16/2011 10:31:50 ET T: 10/16/2011 11:49:17 ET JOB#: [id number]  cc: Boykin Nearing, MD, <Dictator> Boykin Nearing MD ELECTRONICALLY SIGNED 10/19/2011 8:45

## 2014-07-29 NOTE — Consult Note (Signed)
PATIENT NAME:  Patricia Good, RATHE MR#:  [id number] DATE OF BIRTH:  08/12/1960  DATE OF CONSULTATION:  10/15/2011  REFERRING PHYSICIAN:  Dr. Owens Shark, Emergency Department  CONSULTING PHYSICIAN:  Boykin Nearing, MD  HISTORY OF PRESENT ILLNESS: The patient is a 54 year old gravida 1 para 0. The patient was admitted after consensual intercourse tonight, sustained vaginal laceration and developed significant vaginal bleeding thereafter. The patient was brought to the ED, became hypotensive secondary to vasovagal a few times. The patient is status post abdominal hysterectomy 20 years ago and is also status post bilateral salpingectomy for ectopic pregnancy. The patient states intercourse was consensual. No foreign objects placed into the vagina and the patient denies any rectal intercourse as well.   PAST MEDICAL HISTORY:  1. Anemia. 2. Hypothyroidism. 3. Hypertension. 4. Depression. 5. Osteoarthritis. 6. Asthma. 7. Genital herpes.  8. Migraines.   PAST SURGICAL HISTORY:  1. Total abdominal hysterectomy. 2. Bilateral salpingectomy. 3. Left foot surgery.   FAMILY HISTORY: Unremarkable.   REVIEW OF SYSTEMS: Unremarkable.   MEDICATIONS: 1. Inderal. 2. Trazodone. 3. Prozac. 4. ACE inhibitor. 5. Valtrex. 6. Zantac. 7. Synthroid. 8. Eyedrops.   SOCIAL HISTORY: Rare alcohol use. Smokes seven cigarettes a day.   PHYSICAL EXAMINATION:   GENERAL: Well developed, well nourished white female.   VITAL SIGNS: Weight unknown, blood pressure 111/60, pulse 74.   LUNGS: Clear to auscultation without rales.  BREASTS: Not checked.   CARDIOVASCULAR: Regular rate and rhythm without murmur.   ABDOMEN: Soft. Slight lower suprapubic tenderness to palpation. Nondistended abdomen.   PELVIC: Vagina 30 mL of clot. Foley catheter was placed in the vagina for tamponading. Clot was suctioned. The patient appears to have a left lateral fornix laceration with active bleeding.   RECTAL: Not performed.    ASSESSMENT: Traumatic vaginal laceration.   PLAN: Will take the patient to the operating room for repair of vaginal laceration.   CBC shows white blood count of 7, hematocrit of 33.5, and platelet count of 224.   Estimated blood loss thus far approximately 200 mL.   The patient is aware of the need for the procedure. Consent signed. All questions answered.  ____________________________ Boykin Nearing, MD tjs:drc D: 10/15/2011 00:12:49 ET T: 10/15/2011 06:58:04 ET JOB#: [id number] Boykin Nearing MD ELECTRONICALLY SIGNED 10/16/2011 10:17

## 2014-07-29 NOTE — Op Note (Signed)
PATIENT NAME:  Patricia Good, Patricia Good MR#:  [id number] DATE OF BIRTH:  11-22-1960  DATE OF PROCEDURE:  10/15/2011  PREOPERATIVE DIAGNOSIS: Traumatic vaginal laceration.   POSTOPERATIVE DIAGNOSIS:  Traumatic vaginal laceration.   PROCEDURE: Repair of vaginal laceration.   SURGEON: Laverta Baltimore, M.D.   ANESTHESIA: LMA.   INDICATIONS:  54 year old female sustained a vaginal laceration during consensual intercourse several hours before the procedure.   PROCEDURE: After adequate laryngeal mask anesthesia, the patient was placed in the dorsal supine position with the legs in candy-cane stirrups. Lower abdominal, perineal, and vaginal prep was performed with Betadine. The patient received 2 grams IV Ancef during prep. A weighted speculum was placed in the posterior vaginal vault. The bladder was drained yielding 50 mL of clear urine. There was a 6-cm vaginal cuff left lateral fornix laceration noted, active bleeding. The laceration was repaired with a running 2-0 Vicryl suture, locking. Good hemostasis was noted. No active bleeding at the end of the case. No complications. The patient tolerated the procedure well and was taken to the recovery room in good condition.   ____________________________ Boykin Nearing, MD tjs:bjt D: 10/15/2011 02:04:53 ET T: 10/15/2011 09:38:03 ET JOB#: [id number]  cc: Boykin Nearing, MD, <Dictator> Boykin Nearing MD ELECTRONICALLY SIGNED 10/16/2011 10:17

## 2014-10-24 ENCOUNTER — Other Ambulatory Visit: Payer: Self-pay | Admitting: Family Medicine

## 2014-10-24 DIAGNOSIS — R600 Localized edema: Secondary | ICD-10-CM

## 2014-10-26 ENCOUNTER — Ambulatory Visit: Payer: Self-pay

## 2014-10-30 ENCOUNTER — Ambulatory Visit: Payer: Self-pay

## 2014-10-31 ENCOUNTER — Ambulatory Visit: Payer: Self-pay

## 2014-11-07 ENCOUNTER — Ambulatory Visit: Payer: Self-pay

## 2014-11-08 ENCOUNTER — Ambulatory Visit
Admission: RE | Admit: 2014-11-08 | Discharge: 2014-11-08 | Disposition: A | Discharge status: Home / Self Care | Payer: Medicare Other | Source: Ambulatory Visit | Attending: Family Medicine | Admitting: Family Medicine

## 2014-11-08 DIAGNOSIS — R635 Abnormal weight gain: Secondary | ICD-10-CM | POA: Insufficient documentation

## 2014-11-08 DIAGNOSIS — R609 Edema, unspecified: Secondary | ICD-10-CM | POA: Insufficient documentation

## 2014-11-08 DIAGNOSIS — I340 Nonrheumatic mitral (valve) insufficiency: Secondary | ICD-10-CM | POA: Diagnosis not present

## 2014-11-08 DIAGNOSIS — I071 Rheumatic tricuspid insufficiency: Secondary | ICD-10-CM | POA: Diagnosis not present

## 2014-11-08 DIAGNOSIS — Z6837 Body mass index (BMI) 37.0-37.9, adult: Secondary | ICD-10-CM | POA: Insufficient documentation

## 2014-11-08 DIAGNOSIS — R600 Localized edema: Secondary | ICD-10-CM

## 2014-11-08 NOTE — Progress Notes (Signed)
*  PRELIMINARY RESULTS* Echocardiogram 2D Echocardiogram has been performed.  Pt echo is unassigned-Dr. Humphrey Rolls to read.  Patricia Good 11/08/2014, 11:20 AM

## 2015-01-11 ENCOUNTER — Ambulatory Visit: Payer: Self-pay | Admitting: Licensed Clinical Social Worker

## 2015-01-18 ENCOUNTER — Ambulatory Visit (INDEPENDENT_AMBULATORY_CARE_PROVIDER_SITE_OTHER): Payer: 59 | Admitting: Licensed Clinical Social Worker

## 2015-01-18 DIAGNOSIS — F332 Major depressive disorder, recurrent severe without psychotic features: Secondary | ICD-10-CM | POA: Diagnosis not present
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
## 2015-01-23 ENCOUNTER — Ambulatory Visit (INDEPENDENT_AMBULATORY_CARE_PROVIDER_SITE_OTHER): Payer: 59 | Admitting: Psychiatry

## 2015-01-23 ENCOUNTER — Encounter: Payer: Self-pay | Admitting: Psychiatry

## 2015-01-23 VITALS — BP 122/88 | HR 72 | Temp 97.3°F | Ht 61.0 in | Wt 205.6 lb

## 2015-01-23 DIAGNOSIS — F332 Major depressive disorder, recurrent severe without psychotic features: Secondary | ICD-10-CM

## 2015-01-23 DIAGNOSIS — M1990 Unspecified osteoarthritis, unspecified site: Secondary | ICD-10-CM | POA: Insufficient documentation

## 2015-01-23 MED ORDER — FLUOXETINE HCL 20 MG PO TABS: 20.0000 mg | ORAL_TABLET | Freq: Every day | ORAL | Status: DC

## 2015-01-23 MED ORDER — TRAZODONE HCL 50 MG PO TABS: 50.0000 mg | ORAL_TABLET | Freq: Every day | ORAL | Status: DC

## 2015-01-23 MED ORDER — FLUOXETINE HCL 40 MG PO CAPS: 40.0000 mg | ORAL_CAPSULE | Freq: Every day | ORAL | Status: DC

## 2015-01-23 MED ORDER — ARIPIPRAZOLE 20 MG PO TABS: 20.0000 mg | ORAL_TABLET | Freq: Every day | ORAL | Status: DC
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
## 2015-02-01 ENCOUNTER — Ambulatory Visit (INDEPENDENT_AMBULATORY_CARE_PROVIDER_SITE_OTHER): Payer: 59 | Admitting: Licensed Clinical Social Worker

## 2015-02-01 DIAGNOSIS — F332 Major depressive disorder, recurrent severe without psychotic features: Secondary | ICD-10-CM

## 2015-02-01 DIAGNOSIS — F4310 Post-traumatic stress disorder, unspecified: Secondary | ICD-10-CM
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
## 2015-02-20 ENCOUNTER — Ambulatory Visit: Payer: Medicare Other | Admitting: Psychiatry

## 2015-02-22 ENCOUNTER — Ambulatory Visit: Payer: Medicare Other | Admitting: Licensed Clinical Social Worker

## 2015-03-13 ENCOUNTER — Encounter: Payer: Self-pay | Admitting: Psychiatry

## 2015-03-13 ENCOUNTER — Ambulatory Visit (INDEPENDENT_AMBULATORY_CARE_PROVIDER_SITE_OTHER): Payer: 59 | Admitting: Psychiatry

## 2015-03-13 VITALS — BP 122/78 | HR 84 | Temp 98.3°F | Ht 61.0 in | Wt 201.8 lb

## 2015-03-13 DIAGNOSIS — F332 Major depressive disorder, recurrent severe without psychotic features: Secondary | ICD-10-CM

## 2015-03-13 DIAGNOSIS — F4310 Post-traumatic stress disorder, unspecified: Secondary | ICD-10-CM | POA: Diagnosis not present

## 2015-03-13 MED ORDER — FLUOXETINE HCL 20 MG PO CAPS: 20.0000 mg | ORAL_CAPSULE | Freq: Every day | ORAL | Status: DC

## 2015-03-13 MED ORDER — ARIPIPRAZOLE 20 MG PO TABS: 20.0000 mg | ORAL_TABLET | Freq: Every day | ORAL | Status: DC

## 2015-03-13 MED ORDER — FLUOXETINE HCL 40 MG PO CAPS: 40.0000 mg | ORAL_CAPSULE | Freq: Every day | ORAL | Status: DC

## 2015-03-13 MED ORDER — TRAZODONE HCL 50 MG PO TABS: 50.0000 mg | ORAL_TABLET | Freq: Every day | ORAL | Status: DC

## 2015-03-13 NOTE — Progress Notes (Signed)
BH MD/PA/NP OP Progress Note  03/13/2015 12:08 PM Patricia Good  MRN:  [id number]  Subjective:  Patient returns for follow-up of her major depressive disorder. She had her initial visit with me back in October and was continued on her previous medications. She states she continues to feel stable on these medications and denies any side effects or problems. She states that she has moved to another living situation which she finds much more attractive and stable. She is moving there with a roommate and she states she and her roommate are getting along very well. Chief Complaint: None Chief Complaint    Follow-up; Medication Refill     Visit Diagnosis:     ICD-9-CM ICD-10-CM   1. Severe episode of recurrent major depressive disorder, without psychotic features ([place]) 296.33 F33.2 ARIPiprazole (ABILIFY) 20 MG tablet  2. PTSD (post-traumatic stress disorder) 309.81 F43.10     Past Medical History:  Past Medical History  Diagnosis Date  . ADHD (attention deficit hyperactivity disorder)   . Anxiety   . Asthma   . Schizoaffective disorder [facility])     Past Surgical History  Procedure Laterality Date  . Abdominal hysterectomy    . Tubal ligation     Family History:  Family History  Problem Relation Age of Onset  . Hypertension Father   . Diabetes Father   . Heart disease Father   . Blindness Father   . Obesity Brother   . Arthritis Brother   . Alcohol abuse Brother   . Drug abuse Brother   . Depression Brother   . Alcohol abuse Sister   . Drug abuse Sister   . Anxiety disorder Sister   . Depression Sister    Social History:  Social History   Social History  . Marital Status: Single    Spouse Name: N/A  . Number of Children: N/A  . Years of Education: N/A   Social History Main Topics  . Smoking status: Current Some Day Smoker    Types: Cigarettes    Start date: 01/22/1969  . Smokeless tobacco: Never Used  . Alcohol Use: 0.6 - 1.2 oz/week    0 Standard drinks or  equivalent, 0-1 Cans of beer, 1 Glasses of wine, 0 Shots of liquor per week  . Drug Use: No  . Sexual Activity: Yes    Birth Control/ Protection: None   Other Topics Concern  . None   Social History Narrative   Additional History:   Assessment:   Musculoskeletal: Strength & Muscle Tone: within normal limits Gait & Station: normal Patient leans: N/A  Psychiatric Specialty Exam: HPI  ROS  Blood pressure 122/78, pulse 84, temperature 98.3 F (36.8 C), temperature source Tympanic, height 5\' 1"  (1.549 m), weight 201 lb 12.8 oz (91.536 kg), SpO2 96 %.Body mass index is 38.15 kg/(m^2).  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  No. Has the patient been a risk to self in the past 6 months?  No. Has the patient been a risk to self within the distant past?  No. Is the patient a risk to others?  No. Has the patient been a risk to others in the past 6 months?  No. Has the patient been a risk to others within the distant past?  No.  Current Medications: Current Outpatient Prescriptions  Medication Sig Dispense Refill  . albuterol (PROAIR HFA) 108 (90 BASE) MCG/ACT inhaler Inhale into the lungs.    Marland Kitchen amLODipine (NORVASC) 10 MG tablet Take 10 mg by mouth.    . ARIPiprazole (ABILIFY) 20 MG tablet Take 1  tablet (20 mg total) by mouth daily. 30 tablet 4  . beclomethasone (QVAR) 80 MCG/ACT inhaler Inhale into the lungs.    . calcipotriene (DOVONOX) 0.005 % cream Apply topically 2 (two) times daily.    . clobetasol (TEMOVATE) 0.05 % external solution Twice daily as needed for rash and scaling in scalp    . FLUoxetine (PROZAC) 20 MG capsule Take 1 capsule (20 mg total) by mouth daily. Take with 40 mg capsule to equal 60 mg daily. 30 capsule 4  . FLUoxetine (PROZAC) 40 MG capsule Take 1 capsule (40 mg total) by mouth daily. Take with 20 mg tablet to equal 60 mg daily. 30 capsule 4  . furosemide (LASIX) 20 MG tablet Take 20 mg by mouth.    . levothyroxine (SYNTHROID, LEVOTHROID) 112 MCG tablet Take 112 mcg by mouth daily before breakfast.    . meloxicam (MOBIC) 15 MG tablet Take 15 mg by mouth daily.    Marland Kitchen omeprazole (PRILOSEC) 40 MG capsule Take 40 mg by mouth.    . tiotropium (SPIRIVA) 18 MCG inhalation capsule Place into inhaler and inhale.    . traZODone (DESYREL) 50 MG tablet Take 1 tablet (50 mg total) by mouth at bedtime. 30 tablet 4  . valACYclovir (VALTREX) 500 MG tablet Take 500 mg by mouth.     No current facility-administered medications for this visit.    Medical Decision Making:  Established Problem, Stable/Improving (1), Review of Medication Regimen & Side Effects (2) and Review of New Medication or Change in Dosage (2)  Treatment Plan Summary:Medication management and Plan    Major depressive disorder, recurrent, severe with psychotic features-patient has been stable on her current medication for years. She denies any side effects from the current regimen. Thus we will continue her Abilify 20 mg daily, trazodone 50 mg at bedtime and Prozac 60 mg daily. I again discussed patient the metabolic issues related to Abilify. She states she still has the lab slip that I provided her at the last visit and plans to go to her clinic to have them done.  Patient will follow up in 3 months. I've  explained to her my departure from the clinic and that she will have follow-up with a replacement doctor within this clinic.  Faith Rogue 03/13/2015, 12:08 PM

## 2015-03-27 ENCOUNTER — Telehealth: Payer: Self-pay | Admitting: Psychiatry

## 2015-03-27 NOTE — Telephone Encounter (Signed)
Spoke with patient today about her metabolic labs. Did discuss that her total cholesterol was elevated to 42 and triglycerides 171. I did tell her glucose was in normal range at 81. Her prolactin was low at 1.7. Her TSH was slightly low at 0.351 however patient is taking Synthroid. Patient stated she did have a poppy seed muffin in the morning and then had her labs done right around 2:00. This could also have affected her levels. We discussed that the plan would be that would send her the results and she should bring them to her primary care doctor for further assessment. She indicates she expects to have a physical sometime around April 2017. AW

## 2015-04-03 NOTE — Progress Notes (Signed)
Refilled

## 2015-04-17 ENCOUNTER — Encounter: Payer: Self-pay | Admitting: Psychiatry

## 2015-06-11 ENCOUNTER — Ambulatory Visit: Payer: 59 | Admitting: Psychiatry

## 2015-06-24 ENCOUNTER — Ambulatory Visit: Payer: 59 | Admitting: Psychiatry

## 2015-06-24 ENCOUNTER — Encounter: Payer: Self-pay | Admitting: Psychiatry

## 2015-07-02 ENCOUNTER — Encounter: Payer: Self-pay | Admitting: Psychiatry

## 2015-07-02 ENCOUNTER — Ambulatory Visit (INDEPENDENT_AMBULATORY_CARE_PROVIDER_SITE_OTHER): Payer: 59 | Admitting: Psychiatry

## 2015-07-02 VITALS — BP 122/82 | HR 87 | Temp 98.4°F | Ht 61.0 in | Wt 198.4 lb

## 2015-07-02 DIAGNOSIS — F332 Major depressive disorder, recurrent severe without psychotic features: Secondary | ICD-10-CM | POA: Diagnosis not present

## 2015-07-02 MED ORDER — FLUOXETINE HCL 40 MG PO CAPS: 80.0000 mg | ORAL_CAPSULE | Freq: Every day | ORAL | Status: DC

## 2015-07-02 MED ORDER — ARIPIPRAZOLE 20 MG PO TABS: 20.0000 mg | ORAL_TABLET | Freq: Every day | ORAL | Status: DC
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
## 2015-07-25 ENCOUNTER — Encounter: Payer: Self-pay | Admitting: Emergency Medicine

## 2015-07-25 ENCOUNTER — Emergency Department
Admission: EM | Admit: 2015-07-25 | Discharge: 2015-07-25 | Disposition: A | Discharge status: Home / Self Care | Payer: Medicare Other | Attending: Emergency Medicine | Admitting: Emergency Medicine

## 2015-07-25 DIAGNOSIS — F909 Attention-deficit hyperactivity disorder, unspecified type: Secondary | ICD-10-CM | POA: Diagnosis not present

## 2015-07-25 DIAGNOSIS — R45851 Suicidal ideations: Secondary | ICD-10-CM | POA: Diagnosis present

## 2015-07-25 DIAGNOSIS — F17210 Nicotine dependence, cigarettes, uncomplicated: Secondary | ICD-10-CM | POA: Diagnosis not present

## 2015-07-25 DIAGNOSIS — Z79899 Other long term (current) drug therapy: Secondary | ICD-10-CM | POA: Insufficient documentation

## 2015-07-25 DIAGNOSIS — F32A Depression, unspecified: Secondary | ICD-10-CM

## 2015-07-25 DIAGNOSIS — J45909 Unspecified asthma, uncomplicated: Secondary | ICD-10-CM | POA: Diagnosis not present

## 2015-07-25 DIAGNOSIS — F259 Schizoaffective disorder, unspecified: Secondary | ICD-10-CM | POA: Diagnosis not present

## 2015-07-25 DIAGNOSIS — F329 Major depressive disorder, single episode, unspecified: Secondary | ICD-10-CM | POA: Diagnosis not present

## 2015-07-25 LAB — URINE DRUG SCREEN, QUALITATIVE (ARMC ONLY)
Amphetamines, Ur Screen: NOT DETECTED
Barbiturates, Ur Screen: NOT DETECTED
Benzodiazepine, Ur Scrn: NOT DETECTED
CANNABINOID 50 NG, UR ~~LOC~~: NOT DETECTED
COCAINE METABOLITE, UR ~~LOC~~: NOT DETECTED
MDMA (ECSTASY) UR SCREEN: NOT DETECTED
Methadone Scn, Ur: NOT DETECTED
Opiate, Ur Screen: NOT DETECTED
Phencyclidine (PCP) Ur S: NOT DETECTED
TRICYCLIC, UR SCREEN: NOT DETECTED

## 2015-07-25 LAB — COMPREHENSIVE METABOLIC PANEL
ALBUMIN: 3.9 g/dL (ref 3.5–5.0)
ALT: 43 U/L (ref 14–54)
AST: 36 U/L (ref 15–41)
Alkaline Phosphatase: 150 U/L — ABNORMAL HIGH (ref 38–126)
Anion gap: 7 (ref 5–15)
BUN: 14 mg/dL (ref 6–20)
CHLORIDE: 101 mmol/L (ref 101–111)
CO2: 25 mmol/L (ref 22–32)
CREATININE: 1.49 mg/dL — AB (ref 0.44–1.00)
Calcium: 9.2 mg/dL (ref 8.9–10.3)
GFR calc Af Amer: 44 mL/min — ABNORMAL LOW (ref >60)
GFR calc non Af Amer: 38 mL/min — ABNORMAL LOW (ref >60)
Glucose, Bld: 110 mg/dL — ABNORMAL HIGH (ref 65–99)
Potassium: 3.8 mmol/L (ref 3.5–5.1)
SODIUM: 133 mmol/L — AB (ref 135–145)
Total Bilirubin: 0.5 mg/dL (ref 0.3–1.2)
Total Protein: 7.8 g/dL (ref 6.5–8.1)

## 2015-07-25 LAB — CBC
HCT: 36.5 % (ref 35.0–47.0)
HEMOGLOBIN: 12.8 g/dL (ref 12.0–16.0)
MCH: 36.7 pg — AB (ref 26.0–34.0)
MCHC: 35.2 g/dL (ref 32.0–36.0)
MCV: 104.2 fL — AB (ref 80.0–100.0)
Platelets: 256 10*3/uL (ref 150–440)
RBC: 3.5 MIL/uL — AB (ref 3.80–5.20)
RDW: 13.5 % (ref 11.5–14.5)
WBC: 10.6 10*3/uL (ref 3.6–11.0)

## 2015-07-25 LAB — ACETAMINOPHEN LEVEL: Acetaminophen (Tylenol), Serum: <10 ug/mL — ABNORMAL LOW (ref 10–30)

## 2015-07-25 LAB — SALICYLATE LEVEL

## 2015-07-25 LAB — ETHANOL: Alcohol, Ethyl (B): <5 mg/dL (ref <5)

## 2015-07-25 NOTE — ED Notes (Addendum)
Pt presents to ED to be evaluated for SI/HI and depression/anxiety with a plan of cutting her wrist. Pt states she just found out that her partner of 12 years cheated on her. Flat affect noted.

## 2015-07-25 NOTE — ED Provider Notes (Signed)
CSN: [id number]     Arrival date & time 07/25/15  1919 History   First MD Initiated Contact with Patient 07/25/15 2054     Chief Complaint  Patient presents with  . Suicidal  . Homicidal     (Consider location/radiation/quality/duration/timing/severity/associated sxs/prior Treatment) The history is provided by the patient.  Patricia Good is a 55 y.o. female hx of ADHD, anxiety, schizoaffective disorder, here with depression, suicidal ideation. Patient without recently that her boyfriend has been seen in the woman so got very upset. She decided to cut her relationship with him And felt very depressed about it. She was thinking of cutting her wrists and want to come here to get help. Denies homicidal ideations. Denies hallucinations.    Past Medical History  Diagnosis Date  . ADHD (attention deficit hyperactivity disorder)   . Anxiety   . Asthma   . Schizoaffective disorder [facility])    Past Surgical History  Procedure Laterality Date  . Abdominal hysterectomy    . Tubal ligation     Family History  Problem Relation Age of Onset  . Hypertension Father   . Diabetes Father   . Heart disease Father   . Blindness Father   . Obesity Brother   . Arthritis Brother   . Alcohol abuse Brother   . Drug abuse Brother   . Depression Brother   . Alcohol abuse Sister   . Drug abuse Sister   . Anxiety disorder Sister   . Depression Sister    Social History  Substance Use Topics  . Smoking status: Current Some Day Smoker    Types: Cigarettes    Start date: 01/22/1969  . Smokeless tobacco: Never Used  . Alcohol Use: 0.6 - 1.2 oz/week    0 Standard drinks or equivalent, 0-1 Cans of beer, 1 Glasses of wine, 0 Shots of liquor per week   OB History    No data available     Review of Systems  Psychiatric/Behavioral: Positive for dysphoric mood.  All other systems reviewed and are negative.     Allergies  Sulfa antibiotics and Lisinopril  Home Medications   Prior to Admission  medications   Medication Sig Start Date End Date Taking? Authorizing Provider  albuterol (PROAIR HFA) 108 (90 BASE) MCG/ACT inhaler Inhale into the lungs.    Historical Provider, MD  amLODipine (NORVASC) 10 MG tablet Take 10 mg by mouth daily.    Historical Provider, MD  ARIPiprazole (ABILIFY) 20 MG tablet Take 1 tablet (20 mg total) by mouth daily. 07/02/15   Himabindu Ravi, MD  B Complex-C (SUPER B COMPLEX/VITAMIN C PO) Take by mouth.    Historical Provider, MD  FLUoxetine (PROZAC) 40 MG capsule Take 2 capsules (80 mg total) by mouth daily. 07/02/15   Himabindu Ravi, MD  furosemide (LASIX) 20 MG tablet Take 20 mg by mouth.    Historical Provider, MD  levothyroxine (SYNTHROID, LEVOTHROID) 112 MCG tablet Take 112 mcg by mouth daily before breakfast.    Historical Provider, MD  meloxicam (MOBIC) 15 MG tablet Take 15 mg by mouth daily.    Historical Provider, MD  nicotine polacrilex (COMMIT) 2 MG lozenge Take 2 mg by mouth as needed for smoking cessation. Reported on 07/02/2015    Historical Provider, MD  omeprazole (PRILOSEC) 40 MG capsule Take 40 mg by mouth.    Historical Provider, MD  tiotropium (SPIRIVA) 18 MCG inhalation capsule Place into inhaler and inhale.    Historical Provider, MD  traZODone (DESYREL) 50  MG tablet Take 1 tablet (50 mg total) by mouth at bedtime. 03/13/15   Marjie Skiff, MD  valACYclovir (VALTREX) 500 MG tablet Take 500 mg by mouth.    Historical Provider, MD   BP 133/81 mmHg  Pulse 87  Temp(Src) 98.3 F (36.8 C) (Oral)  Resp 16  SpO2 95% Physical Exam  Constitutional: She is oriented to person, place, and time.  Tearful, anxious   HENT:  Head: Normocephalic.  Mouth/Throat: Oropharynx is clear and moist.  Eyes: Conjunctivae are normal. Pupils are equal, round, and reactive to light.  Neck: Normal range of motion. Neck supple.  Cardiovascular: Normal rate, regular rhythm and normal heart sounds.   Pulmonary/Chest: Effort normal and breath sounds normal. No  respiratory distress. She has no wheezes. She has no rales.  Abdominal: Soft. Bowel sounds are normal. She exhibits no distension. There is no tenderness. There is no rebound.  Musculoskeletal: Normal range of motion. She exhibits no edema or tenderness.  Neurological: She is alert and oriented to person, place, and time.  Skin: Skin is warm.  Psychiatric:  Depressed, poor judgment   Nursing note and vitals reviewed.   ED Course  Procedures (including critical care time) Labs Review Labs Reviewed  COMPREHENSIVE METABOLIC PANEL - Abnormal; Notable for the following:    Sodium 133 (*)    Glucose, Bld 110 (*)    Creatinine, Ser 1.49 (*)    Alkaline Phosphatase 150 (*)    GFR calc non Af Amer 38 (*)    GFR calc Af Amer 44 (*)    All other components within normal limits  ACETAMINOPHEN LEVEL - Abnormal; Notable for the following:    Acetaminophen (Tylenol), Serum <10 (*)    All other components within normal limits  CBC - Abnormal; Notable for the following:    RBC 3.50 (*)    MCV 104.2 (*)    MCH 36.7 (*)    All other components within normal limits  ETHANOL  SALICYLATE LEVEL  URINE DRUG SCREEN, QUALITATIVE ([facility] ONLY)    Imaging Review No results found. I have personally reviewed and evaluated these images and lab results as part of my medical decision-making.   EKG Interpretation None      MDM   Final diagnoses:  None    Patricia Good is a 55 y.o. female here with depression, some suicidal ideations. Will get labs, TTS consult.  10:00 PM TTS saw patient. She is able to contract for safety and doesn't really want to harm herself. Tox neg. Will dc home. Has psych follow up       Wandra Arthurs, MD 07/25/15 2201

## 2015-07-25 NOTE — BH Assessment (Signed)
Assessment Note  Patricia Good is an 55 y.o. female, with a hx of ADHD, anxiety, schizoaffective disorder, presents to the ED with concerns with depression and suicidal ideations. Patient reports she recently found out her partner of 12 years was cheating on her for 1 1/2 years.  She states that her boyfriend is also her landlord.  She states she felt like cutting her wrists but decided against it.  She states that it would not be wise for her to try to harm herself "over a man".  She states that she does not believe she needs to be hospitalized but says that she decided to come to hospital to get away from the situation at home.  She denies wanting to hurt herself at this time.  She also denies any HI or auditory/visual hallucinations.  She denies any drug/alcohol use.  Pt reports she will follow up with her psychiatrist and therapist.     Diagnosis: Depression  Past Medical History:  Past Medical History  Diagnosis Date  . ADHD (attention deficit hyperactivity disorder)   . Anxiety   . Asthma   . Schizoaffective disorder [facility])     Past Surgical History  Procedure Laterality Date  . Abdominal hysterectomy    . Tubal ligation      Family History:  Family History  Problem Relation Age of Onset  . Hypertension Father   . Diabetes Father   . Heart disease Father   . Blindness Father   . Obesity Brother   . Arthritis Brother   . Alcohol abuse Brother   . Drug abuse Brother   . Depression Brother   . Alcohol abuse Sister   . Drug abuse Sister   . Anxiety disorder Sister   . Depression Sister     Social History:  reports that she has been smoking Cigarettes.  She started smoking about 46 years ago. She has never used smokeless tobacco. She reports that she drinks about 0.6 - 1.2 oz of alcohol per week. She reports that she does not use illicit drugs.  Additional Social History:  Alcohol / Drug Use History of alcohol / drug use?: No history of alcohol / drug abuse (Pt  denies)  CIWA: CIWA-Ar BP: 133/75 mmHg Pulse Rate: 88 COWS:    Allergies:  Allergies  Allergen Reactions  . Sulfa Antibiotics Hives, Anaphylaxis and Other (See Comments)  . Lisinopril     Home Medications:  (Not in a hospital admission)  OB/GYN Status:  No LMP recorded. Patient has had a hysterectomy.  General Assessment Data Location of Assessment: [facility] ED TTS Assessment: In system Is this a Tele or Face-to-Face Assessment?: Face-to-Face Is this an Initial Assessment or a Re-assessment for this encounter?: Initial Assessment Marital status: Single Maiden name: N/A Is patient pregnant?: No Pregnancy Status: No Living Arrangements: Alone Can pt return to current living arrangement?: Yes Admission Status: Voluntary Is patient capable of signing voluntary admission?: Yes Referral Source: Self/Family/Friend Insurance type: [facility] Medicare     Crisis Care Plan Living Arrangements: Alone Legal Guardian: Other: (self) Name of Psychiatrist: Dr. Einar Grad Name of Therapist: Miguel Dibble  Education Status Is patient currently in school?: No Current Grade: N/A Highest grade of school patient has completed: 12th Name of school: N/A Contact person: N/A  Risk to self with the past 6 months Suicidal Ideation: Yes-Currently Present Has patient been a risk to self within the past 6 months prior to admission? : No Suicidal Intent: No Has patient had any suicidal  intent within the past 6 months prior to admission? : No Is patient at risk for suicide?: No Suicidal Plan?: No Has patient had any suicidal plan within the past 6 months prior to admission? : No Access to Means: Yes Specify Access to Suicidal Means: Pt has access to razor What has been your use of drugs/alcohol within the last 12 months?: None reported by patient Previous Attempts/Gestures: No How many times?: 0 Other Self Harm Risks: None reported Triggers for Past Attempts: None known Intentional Self Injurious  Behavior: None Family Suicide History: No Recent stressful life event(s): Conflict (Comment) (Relationship issues with partner) Persecutory voices/beliefs?: No Depression: Yes Depression Symptoms: Tearfulness, Loss of interest in usual pleasures, Feeling worthless/self pity Substance abuse history and/or treatment for substance abuse?: No Suicide prevention information given to non-admitted patients: Not applicable  Risk to Others within the past 6 months Homicidal Ideation: No Does patient have any lifetime risk of violence toward others beyond the six months prior to admission? : No Thoughts of Harm to Others: No Current Homicidal Intent: No Current Homicidal Plan: No Access to Homicidal Means: No Identified Victim: None identified History of harm to others?: No Assessment of Violence: None Noted Violent Behavior Description: None identified Does patient have access to weapons?: No Criminal Charges Pending?: No Does patient have a court date: No Is patient on probation?: No  Psychosis Hallucinations: None noted Delusions: None noted  Mental Status Report Appearance/Hygiene: In scrubs Eye Contact: Good Motor Activity: Freedom of movement Speech: Logical/coherent Level of Consciousness: Quiet/awake Mood: Depressed Affect: Depressed Anxiety Level: Minimal Thought Processes: Coherent, Relevant Judgement: Unimpaired Orientation: Person, Place, Time, Situation, Appropriate for developmental age Obsessive Compulsive Thoughts/Behaviors: None  Cognitive Functioning Concentration: Normal Memory: Recent Intact, Remote Intact IQ: Average Insight: Good Impulse Control: Good Appetite: Good Weight Loss: 0 Weight Gain: 0 Sleep: No Change Total Hours of Sleep: 6 Vegetative Symptoms: None  ADLScreening [facility] Assessment Services) Patient's cognitive ability adequate to safely complete daily activities?: Yes Patient able to express need for assistance with ADLs?:  Yes Independently performs ADLs?: Yes (appropriate for developmental age)  Prior Inpatient Therapy Prior Inpatient Therapy: No Prior Therapy Dates: N/A Prior Therapy Facilty/Provider(s): N/A Reason for Treatment: N/a  Prior Outpatient Therapy Prior Outpatient Therapy: Yes Prior Therapy Dates: current Prior Therapy Facilty/Provider(s): Dr. Einar Grad Reason for Treatment: depression Does patient have an ACCT team?: No Does patient have Intensive In-House Services?  : No Does patient have [facility] services? : No Does patient have P4CC services?: No  ADL Screening (condition at time of admission) Patient's cognitive ability adequate to safely complete daily activities?: Yes Patient able to express need for assistance with ADLs?: Yes Independently performs ADLs?: Yes (appropriate for developmental age)       Abuse/Neglect Assessment (Assessment to be complete while patient is alone) Physical Abuse: Denies Verbal Abuse: Denies Sexual Abuse: Denies Exploitation of patient/patient's resources: Denies Self-Neglect: Denies Values / Beliefs Cultural Requests During Hospitalization: None Spiritual Requests During Hospitalization: None Consults Spiritual Care Consult Needed: No Social Work Consult Needed: No Regulatory affairs officer (For Healthcare) Does patient have an advance directive?: No Would patient like information on creating an advanced directive?: No - patient declined information    Additional Information 1:1 In Past 12 Months?: No CIRT Risk: No Elopement Risk: No Does patient have medical clearance?: Yes     Disposition:  Disposition Initial Assessment Completed for this Encounter: Yes Disposition of Patient: Outpatient treatment Type of outpatient treatment: Adult (Patient reports she has an appt with  Dr. Einar Grad on May 1. )  On Site Evaluation by:   Reviewed with Physician:    Oneita Hurt 07/25/2015 10:37 PM

## 2015-07-25 NOTE — Discharge Instructions (Signed)
Please take your medicines as prescribed   See your psychiatrist   Return to ER if you have thoughts of harming yourself or others, hallucinations.

## 2015-08-05 ENCOUNTER — Encounter: Payer: Self-pay | Admitting: Psychiatry

## 2015-08-05 ENCOUNTER — Ambulatory Visit: Payer: 59 | Admitting: Psychiatry

## 2015-08-05 VITALS — BP 130/84 | HR 80 | Temp 97.4°F | Ht 61.0 in | Wt 198.8 lb

## 2015-08-05 DIAGNOSIS — F4310 Post-traumatic stress disorder, unspecified: Secondary | ICD-10-CM

## 2015-08-05 DIAGNOSIS — F332 Major depressive disorder, recurrent severe without psychotic features: Secondary | ICD-10-CM

## 2015-08-05 MED ORDER — TRAZODONE HCL 50 MG PO TABS: 50.0000 mg | ORAL_TABLET | Freq: Every day | ORAL | Status: DC

## 2015-08-05 NOTE — Progress Notes (Signed)
Patient ID: Patricia Good, female   DOB: May 20, 1960, 55 y.o.   MRN: [id number]  [facility] MD/PA/NP OP Progress Note  08/05/2015 2:47 PM RENONA MCNAB  MRN:  [id number]  Subjective:  Patient returns for follow-up of her major depressive disorder. States she has not started taking the Prozac at 80mg , will start this Wednesday when she gets a refill. States she is still depressed and it is because she is getting over a 12 year relationship. States she has no appetite , eats once daily. Had suicidal thoughts 2 weeks ago, wanted to cut her wrists and called 911. States she is over that and denies any suicidal thoughts today. States she has applied for [employer] housing. Hears voices all the time, states it is chronic. Denies any suicidal thoughts. Patient is not very communicative and states that she does not need hospitalization and is not at risk to hurt herself.  Chief Complaint: depressed Chief Complaint    Follow-up; Medication Refill     Visit Diagnosis:     ICD-9-CM ICD-10-CM   1. Severe episode of recurrent major depressive disorder, without psychotic features ([place]) 296.33 F33.2   2. PTSD (post-traumatic stress disorder) 309.81 F43.10     Past Medical History:  Past Medical History  Diagnosis Date  . ADHD (attention deficit hyperactivity disorder)   . Anxiety   . Asthma   . Schizoaffective disorder [facility])     Past Surgical History  Procedure Laterality Date  . Abdominal hysterectomy    . Tubal ligation     Family History:  Family History  Problem Relation Age of Onset  . Hypertension Father   . Diabetes Father   . Heart disease Father   . Blindness Father   . Obesity Brother   . Arthritis Brother   . Alcohol abuse Brother   . Drug abuse Brother   . Depression Brother   . Alcohol abuse Sister   . Drug abuse Sister   . Anxiety disorder Sister   . Depression Sister    Social History:  Social History   Social History  . Marital Status: Single    Spouse Name: N/A  . Number  of Children: N/A  . Years of Education: N/A   Social History Main Topics  . Smoking status: Current Some Day Smoker    Types: Cigarettes    Start date: 01/22/1969  . Smokeless tobacco: Never Used  . Alcohol Use: 0.6 - 1.2 oz/week    0 Standard drinks or equivalent, 0-1 Cans of beer, 1 Glasses of wine, 0 Shots of liquor per week  . Drug Use: No  . Sexual Activity: Yes    Birth Control/ Protection: None   Other Topics Concern  . None   Social History Narrative   Additional History:   Assessment:   Musculoskeletal: Strength & Muscle Tone: within normal limits Gait & Station: normal Patient leans: N/A  Psychiatric Specialty Exam: Anxiety      ROS  Blood pressure 130/84, pulse 80, temperature 97.4 F (36.3 C), temperature source Tympanic, height 5\' 1"  (1.549 m), weight 198 lb 12.8 oz (90.175 kg), SpO2 96 %.Body mass index is 37.58 kg/(m^2).  General Appearance: Casual   Eye Contact:  Good  Speech:  Slow   Volume:  Decreased   Mood:  depressed  Affect:  flat  Thought Process:  Linear  Orientation:  Full (Time, Place, and Person)  Thought Content:  Negative  Suicidal Thoughts:  No  Homicidal Thoughts:  No  Memory:  Immediate;   Good Recent;   Good Remote;   Good  Judgement:  Good  Insight:  Good  Psychomotor Activity:  Negative  Concentration:  Good  Recall:  Good  Fund of Knowledge: Good  Language: Good  Akathisia:  Negative  Handed:  Right  AIMS (if indicated):  Done on 07/02/2015, some cog wheeling of right arm  Assets:  Communication Skills Desire for Improvement Housing  ADL's:  Intact  Cognition: WNL  Sleep:  good   Is the patient at risk to self?  No. Has the patient been a risk to self in the past 6 months?  yes Has the patient been a risk to self within the distant past?  No. Is the patient a risk to others?  No. Has the patient been a risk to others in the past 6 months?  No. Has the patient been a risk to others within the distant past?   No.  Current Medications: Current Outpatient Prescriptions  Medication Sig Dispense Refill  . albuterol (PROAIR HFA) 108 (90 BASE) MCG/ACT inhaler Inhale into the lungs.    Marland Kitchen amLODipine (NORVASC) 10 MG tablet Take 10 mg by mouth daily.    . ARIPiprazole (ABILIFY) 20 MG tablet Take 1 tablet (20 mg total) by mouth daily. 30 tablet 4  . B Complex-C (SUPER B COMPLEX/VITAMIN C PO) Take by mouth.    Marland Kitchen FLUoxetine (PROZAC) 40 MG capsule Take 2 capsules (80 mg total) by mouth daily. 60 capsule 1  . furosemide (LASIX) 20 MG tablet Take 20 mg by mouth.    . levothyroxine (SYNTHROID, LEVOTHROID) 112 MCG tablet Take 112 mcg by mouth daily before breakfast.    . meloxicam (MOBIC) 15 MG tablet Take 15 mg by mouth daily.    . nicotine polacrilex (COMMIT) 2 MG lozenge Take 2 mg by mouth as needed for smoking cessation. Reported on 07/02/2015    . omeprazole (PRILOSEC) 40 MG capsule Take 40 mg by mouth.    . tiotropium (SPIRIVA) 18 MCG inhalation capsule Place into inhaler and inhale.    . traZODone (DESYREL) 50 MG tablet Take 1 tablet (50 mg total) by mouth at bedtime. 30 tablet 4  . valACYclovir (VALTREX) 500 MG tablet Take 500 mg by mouth.     No current facility-administered medications for this visit.    Medical Decision Making:  Established Problem, Stable/Improving (1), Review of Medication Regimen & Side Effects (2) and Review of New Medication or Change in Dosage (2)  Treatment Plan Summary:Medication management and Plan    Major depressive disorder, recurrent, severe with psychotic features  Continue her Abilify 20 mg daily Trazodone 50 mg at bedtime  Increase Prozac to 80 mg daily, patient urged to start taking this dosage as soon as possible. Discussed possible hospitalization given her level of depression and most recent suicidal thoughts. However patient states that she is feeling better compared to 2 weeks ago and does not have suicidal thoughts. Recommend therapy but patient states that  she does not like talking about her past trauma.  Patient will follow up in 1 months.   Cherise Fedder 08/05/2015, 2:47 PM

## 2015-09-05 ENCOUNTER — Ambulatory Visit: Payer: 59 | Admitting: Psychiatry

## 2015-09-05 ENCOUNTER — Ambulatory Visit (INDEPENDENT_AMBULATORY_CARE_PROVIDER_SITE_OTHER): Payer: 59 | Admitting: Psychiatry

## 2015-09-05 ENCOUNTER — Encounter: Payer: Self-pay | Admitting: Psychiatry

## 2015-09-05 VITALS — BP 122/78 | HR 72 | Temp 97.5°F | Wt 196.6 lb

## 2015-09-05 DIAGNOSIS — F4310 Post-traumatic stress disorder, unspecified: Secondary | ICD-10-CM

## 2015-09-05 DIAGNOSIS — F332 Major depressive disorder, recurrent severe without psychotic features: Secondary | ICD-10-CM | POA: Diagnosis not present

## 2015-09-05 MED ORDER — FLUOXETINE HCL 40 MG PO CAPS: 40.0000 mg | ORAL_CAPSULE | Freq: Every day | ORAL | Status: DC

## 2015-09-05 NOTE — Progress Notes (Signed)
Patient ID: Patricia Good, female   DOB: [birth year], 55 y.o.   MRN: [id number]   [facility] MD/PA/NP OP Progress Note  09/05/2015 2:59 PM Patricia Good  MRN:  [id number]  Subjective:  Patient returns for follow-up of her major depressive disorder. She has started the Prozac at 80mg  daily, states she is more irritable and losing her temper easily. States she is sleeping too much about 15-20 hours daily. She eats okay, but runs out of food by the end of the month. States she needs to get out more, she is sitting at home too much. Denies any suicidal thoughts today. She is currently living at a rooming house. She continues to hear voices and see things.  Chief Complaint: depressed Chief Complaint    Medication Refill; Follow-up; sleeping too much     Visit Diagnosis:     ICD-9-CM ICD-10-CM   1. Severe episode of recurrent major depressive disorder, without psychotic features ([place]) 296.33 F33.2   2. PTSD (post-traumatic stress disorder) 309.81 F43.10     Past Medical History:  Past Medical History  Diagnosis Date  . ADHD (attention deficit hyperactivity disorder)   . Anxiety   . Asthma   . Schizoaffective disorder [facility])     Past Surgical History  Procedure Laterality Date  . Abdominal hysterectomy    . Tubal ligation     Family History:  Family History  Problem Relation Age of Onset  . Hypertension Father   . Diabetes Father   . Heart disease Father   . Blindness Father   . Obesity Brother   . Arthritis Brother   . Alcohol abuse Brother   . Drug abuse Brother   . Depression Brother   . Alcohol abuse Sister   . Drug abuse Sister   . Anxiety disorder Sister   . Depression Sister    Social History:  Social History   Social History  . Marital Status: Single    Spouse Name: N/A  . Number of Children: N/A  . Years of Education: N/A   Social History Main Topics  . Smoking status: Current Some Day Smoker    Types: Cigarettes    Start date: 01/22/1969  . Smokeless tobacco:  Never Used  . Alcohol Use: 0.6 - 1.2 oz/week    0 Standard drinks or equivalent, 0-1 Cans of beer, 1 Glasses of wine, 0 Shots of liquor per week  . Drug Use: No  . Sexual Activity: Yes    Birth Control/ Protection: None   Other Topics Concern  . None   Social History Narrative   Additional History:   Assessment:   Musculoskeletal: Strength & Muscle Tone: within normal limits Gait & Station: normal Patient leans: N/A  Psychiatric Specialty Exam: Anxiety      ROS  Blood pressure 122/78, pulse 72, temperature 97.5 F (36.4 C), temperature source Tympanic, weight 196 lb 9.6 oz (89.177 kg), SpO2 95 %.Body mass index is 37.17 kg/(m^2).  General Appearance: Casual   Eye Contact:  Good  Speech:  Slow   Volume:  Decreased   Mood:  depressed  Affect:  flat  Thought Process:  Linear  Orientation:  Full (Time, Place, and Person)  Thought Content:  Negative  Suicidal Thoughts:  No  Homicidal Thoughts:  No  Memory:  Immediate;   Good Recent;   Good Remote;   Good  Judgement:  Good  Insight:  Good  Psychomotor Activity:  Negative  Concentration:  Good  Recall:  Good  Fund of Knowledge: Good  Language: Good  Akathisia:  Negative  Handed:  Right  AIMS (if indicated):  Done on 07/02/2015, some cog wheeling of right arm  Assets:  Communication Skills Desire for Improvement Housing  ADL's:  Intact  Cognition: WNL  Sleep:  good   Is the patient at risk to self?  No. Has the patient been a risk to self in the past 6 months?  yes Has the patient been a risk to self within the distant past?  No. Is the patient a risk to others?  No. Has the patient been a risk to others in the past 6 months?  No. Has the patient been a risk to others within the distant past?  No.  Current Medications: Current Outpatient Prescriptions  Medication Sig Dispense Refill  . albuterol (PROAIR HFA) 108 (90 BASE) MCG/ACT inhaler Inhale into the lungs.    Marland Kitchen amLODipine (NORVASC) 10 MG tablet Take  10 mg by mouth daily.    . ARIPiprazole (ABILIFY) 20 MG tablet Take 1 tablet (20 mg total) by mouth daily. 30 tablet 4  . B Complex-C (SUPER B COMPLEX/VITAMIN C PO) Take by mouth.    Marland Kitchen FLUoxetine (PROZAC) 40 MG capsule Take 2 capsules (80 mg total) by mouth daily. 60 capsule 1  . furosemide (LASIX) 20 MG tablet Take 20 mg by mouth.    . levothyroxine (SYNTHROID, LEVOTHROID) 112 MCG tablet Take 112 mcg by mouth daily before breakfast.    . meloxicam (MOBIC) 15 MG tablet Take 15 mg by mouth daily.    . nicotine polacrilex (COMMIT) 2 MG lozenge Take 2 mg by mouth as needed for smoking cessation. Reported on 07/02/2015    . omeprazole (PRILOSEC) 40 MG capsule Take 40 mg by mouth.    . tiotropium (SPIRIVA) 18 MCG inhalation capsule Place into inhaler and inhale.    . traZODone (DESYREL) 50 MG tablet Take 1 tablet (50 mg total) by mouth at bedtime. 30 tablet 2  . valACYclovir (VALTREX) 500 MG tablet Take 500 mg by mouth.     No current facility-administered medications for this visit.    Medical Decision Making:  Established Problem, Stable/Improving (1), Review of Medication Regimen & Side Effects (2) and Review of New Medication or Change in Dosage (2)  Treatment Plan Summary:Medication management and Plan    Major depressive disorder, recurrent, severe with psychotic features  Continue her Abilify 20 mg daily Trazodone 50 mg at bedtime  Decrease  Prozac at 40 mg daily.  Patient will follow up in 1 months.   Bryanda Mikel 09/05/2015, 2:59 PM

## 2015-09-13 ENCOUNTER — Ambulatory Visit: Payer: 59 | Admitting: Licensed Clinical Social Worker

## 2015-09-22 ENCOUNTER — Encounter: Payer: Self-pay | Admitting: Emergency Medicine

## 2015-09-22 ENCOUNTER — Emergency Department
Admission: EM | Admit: 2015-09-22 | Discharge: 2015-09-23 | Disposition: A | Discharge status: Home / Self Care | Payer: Medicare Other | Attending: Emergency Medicine | Admitting: Emergency Medicine

## 2015-09-22 DIAGNOSIS — M1990 Unspecified osteoarthritis, unspecified site: Secondary | ICD-10-CM | POA: Insufficient documentation

## 2015-09-22 DIAGNOSIS — Z86718 Personal history of other venous thrombosis and embolism: Secondary | ICD-10-CM | POA: Insufficient documentation

## 2015-09-22 DIAGNOSIS — Z79899 Other long term (current) drug therapy: Secondary | ICD-10-CM | POA: Insufficient documentation

## 2015-09-22 DIAGNOSIS — F4312 Post-traumatic stress disorder, chronic: Secondary | ICD-10-CM

## 2015-09-22 DIAGNOSIS — J45909 Unspecified asthma, uncomplicated: Secondary | ICD-10-CM | POA: Diagnosis not present

## 2015-09-22 DIAGNOSIS — F909 Attention-deficit hyperactivity disorder, unspecified type: Secondary | ICD-10-CM | POA: Insufficient documentation

## 2015-09-22 DIAGNOSIS — F17210 Nicotine dependence, cigarettes, uncomplicated: Secondary | ICD-10-CM | POA: Diagnosis not present

## 2015-09-22 DIAGNOSIS — F4310 Post-traumatic stress disorder, unspecified: Secondary | ICD-10-CM

## 2015-09-22 DIAGNOSIS — Z791 Long term (current) use of non-steroidal anti-inflammatories (NSAID): Secondary | ICD-10-CM | POA: Diagnosis not present

## 2015-09-22 DIAGNOSIS — Z7982 Long term (current) use of aspirin: Secondary | ICD-10-CM | POA: Diagnosis not present

## 2015-09-22 DIAGNOSIS — F259 Schizoaffective disorder, unspecified: Secondary | ICD-10-CM | POA: Diagnosis not present

## 2015-09-22 DIAGNOSIS — Z046 Encounter for general psychiatric examination, requested by authority: Secondary | ICD-10-CM | POA: Diagnosis present

## 2015-09-22 DIAGNOSIS — F319 Bipolar disorder, unspecified: Secondary | ICD-10-CM | POA: Diagnosis present

## 2015-09-22 HISTORY — DX: Post-traumatic stress disorder, unspecified: F43.10

## 2015-09-22 LAB — COMPREHENSIVE METABOLIC PANEL
ALBUMIN: 3.9 g/dL (ref 3.5–5.0)
ALK PHOS: 106 U/L (ref 38–126)
ALT: 46 U/L (ref 14–54)
ANION GAP: 9 (ref 5–15)
AST: 44 U/L — ABNORMAL HIGH (ref 15–41)
BUN: 15 mg/dL (ref 6–20)
CALCIUM: 8.7 mg/dL — AB (ref 8.9–10.3)
CO2: 25 mmol/L (ref 22–32)
Chloride: 98 mmol/L — ABNORMAL LOW (ref 101–111)
Creatinine, Ser: 1.53 mg/dL — ABNORMAL HIGH (ref 0.44–1.00)
GFR calc Af Amer: 43 mL/min — ABNORMAL LOW (ref >60)
GFR, EST NON AFRICAN AMERICAN: 37 mL/min — AB (ref >60)
GLUCOSE: 104 mg/dL — AB (ref 65–99)
Potassium: 3.9 mmol/L (ref 3.5–5.1)
Sodium: 132 mmol/L — ABNORMAL LOW (ref 135–145)
TOTAL PROTEIN: 7.5 g/dL (ref 6.5–8.1)
Total Bilirubin: 0.5 mg/dL (ref 0.3–1.2)

## 2015-09-22 LAB — CBC
HEMATOCRIT: 36 % (ref 35.0–47.0)
Hemoglobin: 12.6 g/dL (ref 12.0–16.0)
MCH: 36.2 pg — ABNORMAL HIGH (ref 26.0–34.0)
MCHC: 35 g/dL (ref 32.0–36.0)
MCV: 103.7 fL — AB (ref 80.0–100.0)
Platelets: 265 10*3/uL (ref 150–440)
RBC: 3.47 MIL/uL — ABNORMAL LOW (ref 3.80–5.20)
RDW: 13 % (ref 11.5–14.5)
WBC: 9.3 10*3/uL (ref 3.6–11.0)

## 2015-09-22 LAB — URINE DRUG SCREEN, QUALITATIVE (ARMC ONLY)
Amphetamines, Ur Screen: NOT DETECTED
BARBITURATES, UR SCREEN: NOT DETECTED
BENZODIAZEPINE, UR SCRN: NOT DETECTED
Cannabinoid 50 Ng, Ur ~~LOC~~: NOT DETECTED
Cocaine Metabolite,Ur ~~LOC~~: NOT DETECTED
MDMA (Ecstasy)Ur Screen: NOT DETECTED
METHADONE SCREEN, URINE: NOT DETECTED
OPIATE, UR SCREEN: NOT DETECTED
PHENCYCLIDINE (PCP) UR S: NOT DETECTED
Tricyclic, Ur Screen: NOT DETECTED

## 2015-09-22 LAB — SALICYLATE LEVEL: Salicylate Lvl: <4 mg/dL (ref 2.8–30.0)

## 2015-09-22 LAB — ETHANOL

## 2015-09-22 LAB — ACETAMINOPHEN LEVEL

## 2015-09-22 MED ORDER — DIAZEPAM 5 MG PO TABS
5.0000 mg | ORAL_TABLET | Freq: Once | ORAL | Status: AC
  Administered 2015-09-22: 5 mg via ORAL
  Filled 2015-09-22: qty 1

## 2015-09-22 NOTE — ED Provider Notes (Signed)
[facility] Emergency Department Provider Note        Time seen: ----------------------------------------- 10:26 PM on 09/22/2015 -----------------------------------------    I have reviewed the triage vital signs and the nursing notes.   HISTORY  Chief Complaint Psychiatric Evaluation    HPI Patricia Good is a 55 y.o. female who presents to ER for bad memories and flashbacks from her PTSD. Patient states she has a problem with men and that memories from PTSD. She has had thoughts of hurting herself but has no active plan. She feels like she needs to take medication and she needs to be hospitalized for anxiety.   Past Medical History  Diagnosis Date  . ADHD (attention deficit hyperactivity disorder)   . Anxiety   . Asthma   . Schizoaffective disorder ([place])   . PTSD (post-traumatic stress disorder)     Patient Active Problem List   Diagnosis Date Noted  . Arthritis 01/23/2015  . H/O Malignant melanoma 06/15/2014  . Bipolar I disorder ([place]) 09/06/2013  . BP (high blood pressure) 09/06/2013  . Peripheral vascular disease ([place]) 09/06/2013  . Current tobacco use 09/06/2013    Past Surgical History  Procedure Laterality Date  . Abdominal hysterectomy    . Tubal ligation      Allergies Sulfa antibiotics and Lisinopril  Social History Social History  Substance Use Topics  . Smoking status: Current Some Day Smoker    Types: Cigarettes    Start date: 01/22/1969  . Smokeless tobacco: Never Used  . Alcohol Use: 0.6 - 1.2 oz/week    1 Glasses of wine, 0-1 Cans of beer, 0 Shots of liquor, 0 Standard drinks or equivalent per week    Review of Systems Constitutional: Negative for fever. Cardiovascular: Negative for chest pain. Respiratory: Negative for shortness of breath. Gastrointestinal: Negative for abdominal pain, vomiting and diarrhea. Genitourinary: Negative for dysuria. Musculoskeletal: Negative for back pain. Skin: Negative for  rash. Neurological: Negative for headaches, focal weakness or numbness. Psychiatric: Positive for anxiety, PTSD  10-point ROS otherwise negative.  ____________________________________________   PHYSICAL EXAM:  VITAL SIGNS: ED Triage Vitals  Enc Vitals Group     BP 09/22/15 2129 124/73 mmHg     Pulse Rate 09/22/15 2129 88     Resp 09/22/15 2129 18     Temp 09/22/15 2129 98.4 F (36.9 C)     Temp Source 09/22/15 2129 Oral     SpO2 09/22/15 2129 97 %     Weight 09/22/15 2129 200 lb (90.719 kg)     Height 09/22/15 2129 5\' 1"  (1.549 m)     Head Cir --      Peak Flow --      Pain Score --      Pain Loc --      Pain Edu? --      Excl. in [place]? --     Constitutional: Alert and oriented. Well appearing and in no distress. Eyes: Conjunctivae are normal. PERRL. Normal extraocular movements. ENT   Head: Normocephalic and atraumatic.   Nose: No congestion/rhinnorhea.   Mouth/Throat: Mucous membranes are moist.   Neck: No stridor. Cardiovascular: Normal rate, regular rhythm. No murmurs, rubs, or gallops. Respiratory: Normal respiratory effort without tachypnea nor retractions. Breath sounds are clear and equal bilaterally. No wheezes/rales/rhonchi. Gastrointestinal: Soft and nontender. Normal bowel sounds Musculoskeletal: Nontender with normal range of motion in all extremities. No lower extremity tenderness nor edema. Neurologic:  Normal speech and language. No gross focal neurologic deficits are  appreciated.  Skin:  Skin is warm, dry and intact. No rash noted. Psychiatric: Depressed mood and affect ____________________________________________  ED COURSE:  Pertinent labs & imaging results that were available during my care of the patient were reviewed by me and considered in my medical decision making (see chart for details). Patient presents with PTSD and anxiety. She'll be given oral Valium. We will reassess and obtain basic  labs. ____________________________________________    LABS (pertinent positives/negatives)  Labs Reviewed  COMPREHENSIVE METABOLIC PANEL - Abnormal; Notable for the following:    Sodium 132 (*)    Chloride 98 (*)    Glucose, Bld 104 (*)    Creatinine, Ser 1.53 (*)    Calcium 8.7 (*)    AST 44 (*)    GFR calc non Af Amer 37 (*)    GFR calc Af Amer 43 (*)    All other components within normal limits  CBC - Abnormal; Notable for the following:    RBC 3.47 (*)    MCV 103.7 (*)    MCH 36.2 (*)    All other components within normal limits  ETHANOL  SALICYLATE LEVEL  ACETAMINOPHEN LEVEL  URINE DRUG SCREEN, QUALITATIVE ([facility] ONLY)  ____________________________________________  FINAL ASSESSMENT AND PLAN  PTSD, anxiety  Plan: Patient with labs as dictated above. Patient will with anxiety and PTSD. She was given oral Valium. I will consult psychiatry for further evaluation.   Earleen Newport, MD   Note: This dictation was prepared with Dragon dictation. Any transcriptional errors that result from this process are unintentional   Earleen Newport, MD 09/22/15 2228

## 2015-09-22 NOTE — BH Assessment (Signed)
Assessment Note  Patricia Good is an 55 y.o. female presenting to the ED with suicidal ideations without intent or plan and for anxiety and PTSD. Patient reports having problems with men which stems from memories from PTSD.  She reports breaking up with her partner, who was also her landlord, and being kicked out of the home they shared.  She states that she currently lives in a boarding house which is causing some of her anxiety and depression.  She states that she lives with two men and says that she is "tired of being in this situation."  Pt reports lost of appetite, sleeping 10-15 hours a day, feelings of helplessness and hopelessness, and lost of interest in participating/socializing with others.  Pt reports she currently receives medication management for treatment of her PTSD and anxiety.  She states that she was seeing a therapist but felt worse after talking to her counselor because the sessions only focused on the past.  Diagnosis: Major Depression  Past Medical History:  Past Medical History  Diagnosis Date  . ADHD (attention deficit hyperactivity disorder)   . Anxiety   . Asthma   . Schizoaffective disorder ([place])   . PTSD (post-traumatic stress disorder)     Past Surgical History  Procedure Laterality Date  . Abdominal hysterectomy    . Tubal ligation      Family History:  Family History  Problem Relation Age of Onset  . Hypertension Father   . Diabetes Father   . Heart disease Father   . Blindness Father   . Obesity Brother   . Arthritis Brother   . Alcohol abuse Brother   . Drug abuse Brother   . Depression Brother   . Alcohol abuse Sister   . Drug abuse Sister   . Anxiety disorder Sister   . Depression Sister     Social History:  reports that she has been smoking Cigarettes.  She started smoking about 46 years ago. She has never used smokeless tobacco. She reports that she drinks about 0.6 - 1.2 oz of alcohol per week. She reports that she does not use  illicit drugs.  Additional Social History:  Alcohol / Drug Use History of alcohol / drug use?: No history of alcohol / drug abuse  CIWA: CIWA-Ar BP: 124/73 mmHg Pulse Rate: 88 COWS:    Allergies:  Allergies  Allergen Reactions  . Sulfa Antibiotics Hives, Anaphylaxis and Other (See Comments)  . Lisinopril     Home Medications:  (Not in a hospital admission)  OB/GYN Status:  No LMP recorded. Patient has had a hysterectomy.  General Assessment Data Location of Assessment: [facility] ED TTS Assessment: In system Is this a Tele or Face-to-Face Assessment?: Face-to-Face Is this an Initial Assessment or a Re-assessment for this encounter?: Initial Assessment Marital status: Single Maiden name: n/a Is patient pregnant?: No Pregnancy Status: No Living Arrangements: Alone Can pt return to current living arrangement?: Yes Admission Status: Voluntary Is patient capable of signing voluntary admission?: Yes Referral Source: Self/Family/Friend Insurance type: [facility] Medicare  Medical Screening Exam ([place]) Medical Exam completed: Yes  Crisis Care Plan Living Arrangements: Alone Legal Guardian: Other: (Self) Name of Psychiatrist: Dr. Einar Grad Name of Therapist: Miguel Dibble  Education Status Is patient currently in school?: No Current Grade: n/a Highest grade of school patient has completed: 12th Name of school: N/A Contact person: N/A  Risk to self with the past 6 months Suicidal Ideation: Yes-Currently Present Has patient been a risk to self  within the past 6 months prior to admission? : No Suicidal Intent: No Has patient had any suicidal intent within the past 6 months prior to admission? : No Is patient at risk for suicide?: No Suicidal Plan?: No Has patient had any suicidal plan within the past 6 months prior to admission? : No Access to Means: Yes Specify Access to Suicidal Means: Pt has access to razor What has been your use of drugs/alcohol within the last 12  months?: None reported by patient Previous Attempts/Gestures: No How many times?: 0 Other Self Harm Risks: None identified Triggers for Past Attempts: None known Intentional Self Injurious Behavior: None Family Suicide History: No Recent stressful life event(s): Loss (Comment) (Pt lost her housing and now lives in a boarding house) Persecutory voices/beliefs?: No Depression: Yes Depression Symptoms: Tearfulness, Loss of interest in usual pleasures, Feeling worthless/self pity Substance abuse history and/or treatment for substance abuse?: No Suicide prevention information given to non-admitted patients: Not applicable  Risk to Others within the past 6 months Homicidal Ideation: No Does patient have any lifetime risk of violence toward others beyond the six months prior to admission? : No Thoughts of Harm to Others: No Current Homicidal Intent: No Current Homicidal Plan: No Access to Homicidal Means: No Identified Victim: None identified History of harm to others?: No Assessment of Violence: None Noted Violent Behavior Description: None identified Does patient have access to weapons?: No Criminal Charges Pending?: No Does patient have a court date: No Is patient on probation?: No  Psychosis Hallucinations: None noted Delusions: None noted  Mental Status Report Appearance/Hygiene: In scrubs Eye Contact: Good Motor Activity: Freedom of movement Speech: Logical/coherent Level of Consciousness: Crying, Alert Mood: Depressed, Sad, Despair Affect: Depressed, Sad Anxiety Level: None Thought Processes: Coherent, Relevant Judgement: Unimpaired Orientation: Person, Place, Time, Situation, Appropriate for developmental age Obsessive Compulsive Thoughts/Behaviors: None  Cognitive Functioning Concentration: Normal Memory: Recent Intact, Remote Intact IQ: Average Insight: Good Impulse Control: Good Appetite: Fair Weight Loss: 0 Weight Gain: 0 Sleep: Increased Total Hours of  Sleep: 10 Vegetative Symptoms: None  ADLScreening [facility] Assessment Services) Patient's cognitive ability adequate to safely complete daily activities?: Yes Patient able to express need for assistance with ADLs?: Yes Independently performs ADLs?: Yes (appropriate for developmental age)  Prior Inpatient Therapy Prior Inpatient Therapy: No Prior Therapy Dates: N/A Prior Therapy Facilty/Provider(s): N/A Reason for Treatment: N/a  Prior Outpatient Therapy Prior Outpatient Therapy: Yes Prior Therapy Dates: current Prior Therapy Facilty/Provider(s): Dr. Einar Grad Reason for Treatment: depression Does patient have an ACCT team?: No Does patient have Intensive In-House Services?  : No Does patient have [facility] services? : No Does patient have P4CC services?: No  ADL Screening (condition at time of admission) Patient's cognitive ability adequate to safely complete daily activities?: Yes Patient able to express need for assistance with ADLs?: Yes Independently performs ADLs?: Yes (appropriate for developmental age)       Abuse/Neglect Assessment (Assessment to be complete while patient is alone) Physical Abuse: Denies Verbal Abuse: Denies Sexual Abuse: Denies Exploitation of patient/patient's resources: Denies Self-Neglect: Denies Values / Beliefs Cultural Requests During Hospitalization: None Consults Spiritual Care Consult Needed: No Social Work Consult Needed: No Regulatory affairs officer (For Healthcare) Does patient have an advance directive?: No    Additional Information 1:1 In Past 12 Months?: No CIRT Risk: No Elopement Risk: No Does patient have medical clearance?: Yes     Disposition:  Disposition Initial Assessment Completed for this Encounter: Yes Disposition of Patient: Other dispositions Other disposition(s): Other (  Comment) (Pending Psych MD consult)  On Site Evaluation by:   Reviewed with Physician:    Oneita Hurt 09/22/2015 11:27 PM

## 2015-09-22 NOTE — ED Notes (Signed)
Patient states that she started trying to quit smoking and today stared having bad memories and flash backs from her PTSD.

## 2015-09-23 DIAGNOSIS — F4312 Post-traumatic stress disorder, chronic: Secondary | ICD-10-CM

## 2015-09-23 DIAGNOSIS — F4310 Post-traumatic stress disorder, unspecified: Secondary | ICD-10-CM

## 2015-09-23 MED ORDER — LEVOTHYROXINE SODIUM 112 MCG PO TABS
112.0000 ug | ORAL_TABLET | Freq: Once | ORAL | Status: AC
  Administered 2015-09-23: 112 ug via ORAL
  Filled 2015-09-23: qty 1

## 2015-09-23 NOTE — ED Notes (Signed)
Pt's ride will be here in an hour. Pt taking a shower prior to leaving. No distress noted. Maintained on 15 minute checks and observation by security camera for safety.

## 2015-09-23 NOTE — ED Notes (Signed)
Pt depressed, flat affect. When asked if pt had thoughts of harming herself she stated, "I just don't care anymore."  Pt had poor eye contact. Did not offer much information to this Probation officer. No distress noted. Maintained on 15 minute checks and observation by security camera for safety.

## 2015-09-23 NOTE — ED Notes (Signed)
Pt not willing to speak with a female doctor. Requesting her female psychiatrist come to see her in the ER.  Pt informed by psychiatrist and RN that her doctor was not seeing patients in the ER. No distress noted. Maintained on 15 minute checks and observation by security camera for safety.

## 2015-09-23 NOTE — ED Provider Notes (Signed)
-----------------------------------------   8:15 AM on 09/23/2015 -----------------------------------------   Blood pressure 122/58, pulse 58, temperature 97.8 F (36.6 C), temperature source Oral, resp. rate 16, height 5\' 1"  (1.549 m), weight 200 lb (90.719 kg), SpO2 98 %.  The patient had no acute events since last update.  Calm and cooperative at this time.  Disposition is pending per Psychiatry/Behavioral Medicine team recommendations.     Paulette Blanch, MD 09/23/15 [phone]

## 2015-09-23 NOTE — ED Provider Notes (Signed)
-----------------------------------------   1:31 PM on 09/23/2015 -----------------------------------------   Blood pressure 122/58, pulse 58, temperature 97.8 F (36.6 C), temperature source Oral, resp. rate 16, height 5\' 1"  (1.549 m), weight 200 lb (90.719 kg), SpO2 98 %.  The patient had no acute events since last update.  Calm and cooperative at this time.  Patient not expressing any suicidal or homicidal ideation at this time. Seen by Dr. Weber Cooks and deemed appropriate for follow-up as an outpatient with her psychiatrist, Dr. Einar Grad.      Orbie Pyo, MD 09/23/15 1331

## 2015-09-23 NOTE — ED Notes (Signed)
Patient asleep in room. No noted distress or abnormal behavior. Will continue 15 minute checks and observation by security cameras for safety. 

## 2015-09-23 NOTE — ED Notes (Signed)
Pt getting dressed to wait for her ride in the lobby. All belongings / medications  will be returned to pt upon discharge. No distress noted. Maintained on 15 minute checks and observation by security camera for safety.

## 2015-09-23 NOTE — Discharge Instructions (Signed)
Posttraumatic Stress Disorder Posttraumatic stress disorder (PTSD) is a mental disorder. It occurs after a traumatic event in your life. The traumatic events that cause PTSD are outside the range of normal human experience. Examples of these events include war, automobile accidents, natural disasters, rape, domestic violence, and violent crimes. Most people who experience these types of events are able to heal on their own. Those who do not heal develop PTSD. PTSD can happen to anyone at any age. However, people with a history of childhood abuse are at increased risk for developing PTSD.  SYMPTOMS  The traumatic event that causes PTSD must be a threat to life, cause serious injury, or involve sexual violence. The traumatic event is usually experienced directly by the person who develops PTSD. Sometimes PTSD occurs in people who witness traumas that occur to others or who hear about a trauma that occurs to a close family member or friend. The following behaviors are characteristic of people with PTSD:  People with PTSD re-experience the traumatic event in one or more of the following ways (intrusion symptoms):  Recurrent, unwanted distressing memories while awake.  Recurrent distressing dreams.  Sensations similar to those felt when the event originally occurred (flashbacks).   Intense or prolonged emotional distress, triggered by reminders of the trauma. This may include fear, horror, intense sadness, or anger.  Marked physical reactions, triggered by reminders of the trauma. This may include racing heart, shortness of breath, sweating, and shaking.  People with PTSD avoid thoughts, conversations, people, or activities that remind them of the traumatic event (avoidance symptoms).  People with PTSD have negative changes in their thinking and mood after the traumatic event. These changes include:  Inability to remember one or more significant aspects of the traumatic event (memory  gaps).  Exaggerated negative perceptions about themselves or others, such as believing that they are bad people or that no one can be trusted.  Unrealistic assignment of blame to themselves or others for the traumatic event.  Persistent negative emotional state, such as fear, horror, anger, sadness, guilt, or shame.  Markedly decreased interest or participation in significant activities.  A loss of connection with other people.  Inability to experience positive emotions, such as happiness or love.  People with PTSD are more sensitive to their environment and react more easily than others (hyperarousal-overreactivity symptoms). These symptoms include:  Irritability, with angry outbursts toward other people or objects. The outbursts are easily triggered and may be verbal or physical.  Careless or self-destructive behavior. This may include reckless driving or drug use.  A feeling of being on edge, with increased alertness (hypervigilance).  Exaggerated reactions to stimuli, such as being easily startled.   Difficulty concentrating.  Difficulty sleeping. PTSD symptoms may start soon after a frightening event or months or years later. They last at least 1 month or longer and can affect one or more areas of functioning, such as social or occupational functioning.  DIAGNOSIS  PTSD is diagnosed through an assessment by a mental health professional. You will be asked questions about the traumatic events in your life. You will also be asked about how these events have changed your thoughts, mood, behavior, and ability to function on a daily basis. You may be asked about your use of alcohol or drugs, which can make PTSD symptoms worse. TREATMENT  Unlike many mental disorders, which require lifelong management, PTSD is a curable condition. The goal of PTSD treatment is to neutralize the negative effects of the traumatic event on daily   functioning, not erase the memory of the event. The  following treatments may be prescribed to reach this goal:  Medicines. Certain medicines can reduce some PTSD symptoms. Intrusion symptoms and hyperarousal-overactivity symptoms respond best to medicines.  Counseling (talk therapy). Talk therapy with a mental health professional who is experienced in treating PTSD can help. Talk therapy can provide education, emotional support, and coping skills. Certain types of talk therapy that specifically target the traumatic events are the most effective treatment for PTSD:  Prolonged exposure therapy, which involves remembering and processing the traumatic event with a therapist in a safe environment until it no longer creates a negative emotional response.  Eye movement desensitization and reprocessing therapy, which involves the use of repetitive physical stimulation of the senses that alternates between the right and left sides of the body. It is believed that this therapy facilitates communication between the two sides of the brain. This communication helps the mind to integrate the fragmented memories of the traumatic event into a whole story that makes sense and no longer creates a negative emotional response. Most people with PTSD benefit from a combination of these treatments.    This information is not intended to replace advice given to you by your health care provider. Make sure you discuss any questions you have with your health care provider.   Document Released: 12/16/2000 Document Revised: 04/13/2014 Document Reviewed: 06/09/2012 Elsevier Interactive Patient Education 2016 [employer]  

## 2015-09-23 NOTE — ED Notes (Signed)
Pt discharged to lobby

## 2015-09-23 NOTE — Consult Note (Signed)
[facility] Face-to-Face Psychiatry Consult   Reason for Consult:  Consult for 55 year old woman not under involuntary commitment came into the emergency room voluntarily. Referring Physician:  Clearnce Hasten Patient Identification: Patricia Good MRN:  [id number] Principal Diagnosis: PTSD (post-traumatic stress disorder) Diagnosis:   Patient Active Problem List   Diagnosis Date Noted  . PTSD (post-traumatic stress disorder) [F43.10] 09/23/2015  . Arthritis [M19.90] 01/23/2015  . H/O Malignant melanoma [Z85.820] 06/15/2014  . Bipolar I disorder ([place]) [F31.9] 09/06/2013  . BP (high blood pressure) [I10] 09/06/2013  . Peripheral vascular disease ([place]) [I73.9] 09/06/2013  . Current tobacco use [Z72.0] 09/06/2013    Total Time spent with patient: 45 minutes  Subjective:   Patricia Good is a 55 y.o. female patient admitted with "I don't want to talk with you".  HPI:  Attempted to interview patient. Chart reviewed. Labs and vitals reviewed. Old chart reviewed. 55 year old woman with a history of chronic mental health problems variously diagnosed as posttraumatic stress disorder bipolar disorder or psychotic disorder who presented to the emergency room saying that her mood was worse. She had reported to the original intake provider having some suicidal thoughts but without any intent plan or action on it. She has subsequently denied any acute suicidal ideation. Patient has been cooperative with treatment and not aggressive or dangerous or apparently psychotic. When I attempted to interview the patient she inform me that she would only talk with her regular outpatient psychiatrist Dr Einar Grad. I informed her that I did not believe that that Dr. was available at the moment and we needed to do an evaluation in the emergency room. Patient said that she declined to discuss the problems with me any further.  Social history: Information from the intake [facility] [place]. Recently has had some kind of breakup with her  partner. Patient told me she was not clear whether she still had a place to live.  Medical history: History of high blood pressure and a past history of malignant melanoma.  Substance abuse history: Current labs did not indicate exception to. Patient had denied any acute substance abuse problems. Not in the past chart as being an issue.  Past Psychiatric History: Patient has had at least 1 prior psychiatric hospitalization a few years ago. At that time she was having suicidal ideation. Does have a history of some overdoses in the past. She says that she is compliant with and goes to her therapist and current psychiatrist. He had recently been some change in her antidepressant evidently.  Risk to Self: Suicidal Ideation: Yes-Currently Present Suicidal Intent: No Is patient at risk for suicide?: No Suicidal Plan?: No Access to Means: Yes Specify Access to Suicidal Means: Pt has access to razor What has been your use of drugs/alcohol within the last 12 months?: None reported by patient How many times?: 0 Other Self Harm Risks: None identified Triggers for Past Attempts: None known Intentional Self Injurious Behavior: None Risk to Others: Homicidal Ideation: No Thoughts of Harm to Others: No Current Homicidal Intent: No Current Homicidal Plan: No Access to Homicidal Means: No Identified Victim: None identified History of harm to others?: No Assessment of Violence: None Noted Violent Behavior Description: None identified Does patient have access to weapons?: No Criminal Charges Pending?: No Does patient have a court date: No Prior Inpatient Therapy: Prior Inpatient Therapy: No Prior Therapy Dates: N/A Prior Therapy Facilty/Provider(s): N/A Reason for Treatment: N/a Prior Outpatient Therapy: Prior Outpatient Therapy: Yes Prior Therapy Dates: current Prior Therapy Facilty/Provider(s): Dr. Einar Grad  Reason for Treatment: depression Does patient have an ACCT team?: No Does patient have  Intensive In-House Services?  : No Does patient have [facility] services? : No Does patient have P4CC services?: No  Past Medical History:  Past Medical History  Diagnosis Date  . ADHD (attention deficit hyperactivity disorder)   . Anxiety   . Asthma   . Schizoaffective disorder ([place])   . PTSD (post-traumatic stress disorder)     Past Surgical History  Procedure Laterality Date  . Abdominal hysterectomy    . Tubal ligation     Family History:  Family History  Problem Relation Age of Onset  . Hypertension Father   . Diabetes Father   . Heart disease Father   . Blindness Father   . Obesity Brother   . Arthritis Brother   . Alcohol abuse Brother   . Drug abuse Brother   . Depression Brother   . Alcohol abuse Sister   . Drug abuse Sister   . Anxiety disorder Sister   . Depression Sister    Family Psychiatric  History: Patient would not answer any questions about family history Social History:  History  Alcohol Use  . 0.6 - 1.2 oz/week  . 1 Glasses of wine, 0-1 Cans of beer, 0 Shots of liquor, 0 Standard drinks or equivalent per week     History  Drug Use No    Social History   Social History  . Marital Status: Single    Spouse Name: N/A  . Number of Children: N/A  . Years of Education: N/A   Social History Main Topics  . Smoking status: Current Some Day Smoker    Types: Cigarettes    Start date: 01/22/1969  . Smokeless tobacco: Never Used  . Alcohol Use: 0.6 - 1.2 oz/week    1 Glasses of wine, 0-1 Cans of beer, 0 Shots of liquor, 0 Standard drinks or equivalent per week  . Drug Use: No  . Sexual Activity: Not Asked   Other Topics Concern  . None   Social History Narrative   Additional Social History:    Allergies:   Allergies  Allergen Reactions  . Sulfa Antibiotics Hives, Anaphylaxis and Other (See Comments)  . Lisinopril     Labs:  Results for orders placed or performed during the hospital encounter of 09/22/15 (from the past 48 hour(s))   Comprehensive metabolic panel     Status: Abnormal   Collection Time: 09/22/15  9:35 PM  Result Value Ref Range   Sodium 132 (L) 135 - 145 mmol/L   Potassium 3.9 3.5 - 5.1 mmol/L   Chloride 98 (L) 101 - 111 mmol/L   CO2 25 22 - 32 mmol/L   Glucose, Bld 104 (H) 65 - 99 mg/dL   BUN 15 6 - 20 mg/dL   Creatinine, Ser 1.53 (H) 0.44 - 1.00 mg/dL   Calcium 8.7 (L) 8.9 - 10.3 mg/dL   Total Protein 7.5 6.5 - 8.1 g/dL   Albumin 3.9 3.5 - 5.0 g/dL   AST 44 (H) 15 - 41 U/L   ALT 46 14 - 54 U/L   Alkaline Phosphatase 106 38 - 126 U/L   Total Bilirubin 0.5 0.3 - 1.2 mg/dL   GFR calc non Af Amer 37 (L) >60 mL/min   GFR calc Af Amer 43 (L) >60 mL/min    Comment: (NOTE) The eGFR has been calculated using the CKD EPI equation. This calculation has not been validated in all clinical situations. eGFR's  persistently <60 mL/min signify possible Chronic Kidney Disease.    Anion gap 9 5 - 15  Ethanol     Status: None   Collection Time: 09/22/15  9:35 PM  Result Value Ref Range   Alcohol, Ethyl (B) <5 <5 mg/dL    Comment:        LOWEST DETECTABLE LIMIT FOR SERUM ALCOHOL IS 5 mg/dL FOR MEDICAL PURPOSES ONLY   Salicylate level     Status: None   Collection Time: 09/22/15  9:35 PM  Result Value Ref Range   Salicylate Lvl <2.7 2.8 - 30.0 mg/dL  Acetaminophen level     Status: Abnormal   Collection Time: 09/22/15  9:35 PM  Result Value Ref Range   Acetaminophen (Tylenol), Serum <10 (L) 10 - 30 ug/mL    Comment:        THERAPEUTIC CONCENTRATIONS VARY SIGNIFICANTLY. A RANGE OF 10-30 ug/mL MAY BE AN EFFECTIVE CONCENTRATION FOR MANY PATIENTS. HOWEVER, SOME ARE BEST TREATED AT CONCENTRATIONS OUTSIDE THIS RANGE. ACETAMINOPHEN CONCENTRATIONS >150 ug/mL AT 4 HOURS AFTER INGESTION AND >50 ug/mL AT 12 HOURS AFTER INGESTION ARE OFTEN ASSOCIATED WITH TOXIC REACTIONS.   cbc     Status: Abnormal   Collection Time: 09/22/15  9:35 PM  Result Value Ref Range   WBC 9.3 3.6 - 11.0 K/uL   RBC 3.47 (L)  3.80 - 5.20 MIL/uL   Hemoglobin 12.6 12.0 - 16.0 g/dL   HCT 36.0 35.0 - 47.0 %   MCV 103.7 (H) 80.0 - 100.0 fL   MCH 36.2 (H) 26.0 - 34.0 pg   MCHC 35.0 32.0 - 36.0 g/dL   RDW 13.0 11.5 - 14.5 %   Platelets 265 150 - 440 K/uL  Urine Drug Screen, Qualitative     Status: None   Collection Time: 09/22/15  9:40 PM  Result Value Ref Range   Tricyclic, Ur Screen NONE DETECTED NONE DETECTED   Amphetamines, Ur Screen NONE DETECTED NONE DETECTED   MDMA (Ecstasy)Ur Screen NONE DETECTED NONE DETECTED   Cocaine Metabolite,Ur [place] NONE DETECTED NONE DETECTED   Opiate, Ur Screen NONE DETECTED NONE DETECTED   Phencyclidine (PCP) Ur S NONE DETECTED NONE DETECTED   Cannabinoid 50 Ng, Ur [place] NONE DETECTED NONE DETECTED   Barbiturates, Ur Screen NONE DETECTED NONE DETECTED   Benzodiazepine, Ur Scrn NONE DETECTED NONE DETECTED   Methadone Scn, Ur NONE DETECTED NONE DETECTED    Comment: (NOTE) 035  Tricyclics, urine               Cutoff 1000 ng/mL 200  Amphetamines, urine             Cutoff 1000 ng/mL 300  MDMA (Ecstasy), urine           Cutoff 500 ng/mL 400  Cocaine Metabolite, urine       Cutoff 300 ng/mL 500  Opiate, urine                   Cutoff 300 ng/mL 600  Phencyclidine (PCP), urine      Cutoff 25 ng/mL 700  Cannabinoid, urine              Cutoff 50 ng/mL 800  Barbiturates, urine             Cutoff 200 ng/mL 900  Benzodiazepine, urine           Cutoff 200 ng/mL 1000 Methadone, urine  Cutoff 300 ng/mL 1100 1200 The urine drug screen provides only a preliminary, unconfirmed 1300 analytical test result and should not be used for non-medical 1400 purposes. Clinical consideration and professional judgment should 1500 be applied to any positive drug screen result due to possible 1600 interfering substances. A more specific alternate chemical method 1700 must be used in order to obtain a confirmed analytical result.  1800 Gas chromato graphy / mass spectrometry (GC/MS) is the  preferred 1900 confirmatory method.     No current facility-administered medications for this encounter.   Current Outpatient Prescriptions  Medication Sig Dispense Refill  . albuterol (PROAIR HFA) 108 (90 BASE) MCG/ACT inhaler Inhale 1-2 puffs into the lungs every 4 (four) hours as needed.     Marland Kitchen amLODipine (NORVASC) 10 MG tablet Take 10 mg by mouth daily.    . ARIPiprazole (ABILIFY) 20 MG tablet Take 1 tablet (20 mg total) by mouth daily. 30 tablet 4  . aspirin EC 81 MG tablet Take 81 mg by mouth daily.    . B Complex-C (B-COMPLEX WITH VITAMIN C) tablet Take 1 tablet by mouth daily.    . beclomethasone (QVAR) 80 MCG/ACT inhaler Inhale 2 puffs into the lungs 2 (two) times daily.    . calcium-vitamin D (OSCAL WITH D) 500-200 MG-UNIT tablet Take 1 tablet by mouth daily with breakfast.    . FLUoxetine (PROZAC) 40 MG capsule Take 1 capsule (40 mg total) by mouth daily. 30 capsule 1  . furosemide (LASIX) 20 MG tablet Take 20 mg by mouth daily.     Marland Kitchen levothyroxine (SYNTHROID, LEVOTHROID) 112 MCG tablet Take 112 mcg by mouth daily before breakfast.    . meloxicam (MOBIC) 15 MG tablet Take 15 mg by mouth daily.    Marland Kitchen omeprazole (PRILOSEC) 40 MG capsule Take 40 mg by mouth daily.     Marland Kitchen tiotropium (SPIRIVA) 18 MCG inhalation capsule Place 18 mcg into inhaler and inhale daily.     . traZODone (DESYREL) 50 MG tablet Take 1 tablet (50 mg total) by mouth at bedtime. 30 tablet 2  . valACYclovir (VALTREX) 500 MG tablet Take 500 mg by mouth daily.       Musculoskeletal: Strength & Muscle Tone: within normal limits Gait & Station: normal Patient leans: N/A  Psychiatric Specialty Exam: Physical Exam  Nursing note and vitals reviewed. Constitutional: She appears well-developed and well-nourished.  HENT:  Head: Normocephalic and atraumatic.  Eyes: Conjunctivae are normal. Pupils are equal, round, and reactive to light.  Neck: Normal range of motion.  Cardiovascular: Regular rhythm and normal heart  sounds.   Respiratory: Effort normal. No respiratory distress.  GI: Soft.  Musculoskeletal: Normal range of motion.  Neurological: She is alert.  Skin: Skin is warm and dry.  Psychiatric: Her behavior is normal. Her affect is blunt. She expresses impulsivity. She is noncommunicative.    Review of Systems  Unable to perform ROS: psychiatric disorder    Blood pressure 122/58, pulse 58, temperature 97.8 F (36.6 C), temperature source Oral, resp. rate 16, height _0  (1.549 m), weight 90.719 kg (200 lb), SpO2 98 %.Body mass index is 37.81 kg/(m^2).  General Appearance: Casual  Eye Contact:  None  Speech:  Normal Rate  Volume:  Decreased  Mood:  Dysphoric  Affect:  Constricted  Thought Process:  Linear  Orientation:  Full (Time, Place, and Person)  Thought Content:  Negative  Suicidal Thoughts:  Yes.  without intent/plan  Homicidal Thoughts:  No  Memory:  Immediate;  Poor Recent;   Poor Remote;   Fair  Judgement:  Impaired  Insight:  Shallow  Psychomotor Activity:  Normal  Concentration:  Concentration: Fair  Recall:  Poor  Fund of Knowledge:  Fair  Language:  Fair  Akathisia:  No  Handed:  Right  AIMS (if indicated):     Assets:  Desire for Improvement Physical Health Resilience  ADL's:  Intact  Cognition:  WNL  Sleep:        Treatment Plan Summary: Plan 55 year old woman with a history of PTSD and recurrent depression. Presents with suicidal thoughts without any intent or plan. Has been cooperative with treatment since being here. Indicates to me that she has positive plans for the future without intention to harm herself. She refuses to engage in any further evaluation or discussion of her problem. I informed her that if she was not able to engage in an interview I doubted that we would be able to help her in the hospital. She advised me at that point that she would just as soon be discharged anyway. Patient says she will follow-up with her outpatient psychiatrist and  knows how to get in touch with her. No indication for IVC. She can be released to home and will follow-up with her outpatient psychiatrist in the community.  Disposition: Patient does not meet criteria for psychiatric inpatient admission. Supportive therapy provided about ongoing stressors.  Alethia Berthold, MD 09/23/2015 2:34 PM

## 2015-09-23 NOTE — BH Assessment (Signed)
Writer spoke with the patient to assess their current mental and emotional state. Patient reports of having AV/H and it's ongoing for her. She also reports of dealing with depression and it has worsened over time. She denies SI/HI.

## 2015-09-23 NOTE — ED Notes (Signed)
Pt's home medications picked up from pharmacy. Discharge paperwork reviewed with pt. No distress noted. Maintained on 15 minute checks and observation by security camera for safety.

## 2015-09-23 NOTE — ED Notes (Signed)
Pt requested to use the phone. Pt remained calm and returned phone to nurses station. Pt asking about her home medications. RN informed pt it will be up to the psychiatrist to determine which medications will be restarted. Pt accepting. No distress noted. Maintained on 15 minute checks and observation by security camera for safety.

## 2015-09-23 NOTE — ED Notes (Signed)
Pt feels safe to leave hospital. All belongings returned to pt.

## 2015-10-02 ENCOUNTER — Ambulatory Visit: Payer: 59 | Admitting: Psychiatry

## 2015-10-11 ENCOUNTER — Ambulatory Visit: Payer: 59 | Admitting: Psychiatry

## 2015-11-13 ENCOUNTER — Ambulatory Visit: Payer: 59 | Admitting: Psychiatry

## 2015-11-18 ENCOUNTER — Other Ambulatory Visit: Payer: Self-pay | Admitting: Family Medicine

## 2015-11-18 DIAGNOSIS — Z1231 Encounter for screening mammogram for malignant neoplasm of breast: Secondary | ICD-10-CM

## 2015-11-28 ENCOUNTER — Encounter: Payer: Self-pay | Admitting: Psychiatry

## 2015-11-28 ENCOUNTER — Ambulatory Visit: Payer: 59 | Admitting: Psychiatry

## 2015-11-28 VITALS — BP 130/73 | HR 75 | Temp 98.4°F | Ht 61.0 in | Wt 195.2 lb

## 2015-11-28 DIAGNOSIS — F4310 Post-traumatic stress disorder, unspecified: Secondary | ICD-10-CM

## 2015-11-28 DIAGNOSIS — F332 Major depressive disorder, recurrent severe without psychotic features: Secondary | ICD-10-CM

## 2015-11-28 MED ORDER — FLUOXETINE HCL 40 MG PO CAPS: 40.0000 mg | ORAL_CAPSULE | Freq: Every day | ORAL | 1 refills | Status: DC

## 2015-11-28 MED ORDER — ARIPIPRAZOLE 10 MG PO TABS: 10.0000 mg | ORAL_TABLET | Freq: Every day | ORAL | 1 refills | Status: DC

## 2015-11-28 MED ORDER — TRAZODONE HCL 50 MG PO TABS: 50.0000 mg | ORAL_TABLET | Freq: Every day | ORAL | 2 refills | Status: DC

## 2015-11-28 NOTE — Progress Notes (Signed)
Patient ID: Patricia Good, female   DOB: Jul 03, 1960, 55 y.o.   MRN: [id number]   [facility] MD/PA/NP OP Progress Note  11/28/2015 9:09 AM Patricia Good  MRN:  [id number]  Subjective:  Patient returns for follow-up of her major depressive disorder. Patient reports her irritability has improved on the reduced dose of Prozac at 40mg . She reports continued depression. She reports she is sleeping all the time. Denies any suicidal thoughts. States she is living at a good rooming house. She will be starting group therapy next week [facility] Says she is excited about this. Continues to hear voices and states this baseline and does not bother her. Patient denies suicidal thoughts.   Chief Complaint: depressed Chief Complaint    Medication Problem; Medication Refill; Follow-up     Visit Diagnosis:   No diagnosis found.  Past Medical History:  Past Medical History:  Diagnosis Date  . ADHD (attention deficit hyperactivity disorder)   . Anxiety   . Asthma   . PTSD (post-traumatic stress disorder)   . Schizoaffective disorder [facility])     Past Surgical History:  Procedure Laterality Date  . ABDOMINAL HYSTERECTOMY    . TUBAL LIGATION     Family History:  Family History  Problem Relation Age of Onset  . Hypertension Father   . Diabetes Father   . Heart disease Father   . Blindness Father   . Obesity Brother   . Arthritis Brother   . Alcohol abuse Brother   . Drug abuse Brother   . Depression Brother   . Alcohol abuse Sister   . Drug abuse Sister   . Anxiety disorder Sister   . Depression Sister    Social History:  Social History   Social History  . Marital status: Single    Spouse name: N/A  . Number of children: N/A  . Years of education: N/A   Social History Main Topics  . Smoking status: Current Some Day Smoker    Types: Cigarettes    Start date: 01/22/1969  . Smokeless tobacco: Never Used  . Alcohol use 0.6 - 1.2 oz/week    1 Glasses of wine per week  . Drug use:  No  . Sexual activity: Not Currently   Other Topics Concern  . None   Social History Narrative  . None   Additional History:   Assessment:   Musculoskeletal: Strength & Muscle Tone: within normal limits Gait & Station: normal Patient leans: N/A  Psychiatric Specialty Exam: Anxiety     Medication Refill     ROS  Blood pressure 130/73, pulse 75, temperature 98.4 F (36.9 C), temperature source Oral, height 5\' 1"  (1.549 m), weight 195 lb 3.2 oz (88.5 kg).Body mass index is 36.88 kg/m.  General Appearance: Casual   Eye Contact:  Good  Speech:  Slow   Volume:  Decreased   Mood:  depressed  Affect:  flat  Thought Process:  Linear  Orientation:  Full (Time, Place, and Person)  Thought Content:  Negative  Suicidal Thoughts:  No  Homicidal Thoughts:  No  Memory:  Immediate;   Good Recent;   Good Remote;   Good  Judgement:  Good  Insight:  Good  Psychomotor Activity:  Negative  Concentration:  Good  Recall:  Good  Fund of Knowledge: Good  Language: Good  Akathisia:  Negative  Handed:  Right  AIMS (if indicated):  Done on 07/02/2015, some cog wheeling of right arm  Assets:  Communication Skills Desire  for Improvement Housing  ADL's:  Intact  Cognition: WNL  Sleep:  good   Is the patient at risk to self?  No. Has the patient been a risk to self in the past 6 months?  yes Has the patient been a risk to self within the distant past?  No. Is the patient a risk to others?  No. Has the patient been a risk to others in the past 6 months?  No. Has the patient been a risk to others within the distant past?  No.  Current Medications: Current Outpatient Prescriptions  Medication Sig Dispense Refill  . albuterol (PROAIR HFA) 108 (90 BASE) MCG/ACT inhaler Inhale 1-2 puffs into the lungs every 4 (four) hours as needed.     Marland Kitchen amLODipine (NORVASC) 10 MG tablet Take 10 mg by mouth daily.    . ARIPiprazole (ABILIFY) 20 MG tablet Take 1 tablet (20 mg total) by mouth daily.  30 tablet 4  . aspirin EC 81 MG tablet Take 81 mg by mouth daily.    . B Complex-C (B-COMPLEX WITH VITAMIN C) tablet Take 1 tablet by mouth daily.    . beclomethasone (QVAR) 80 MCG/ACT inhaler Inhale 2 puffs into the lungs 2 (two) times daily.    . betamethasone dipropionate (DIPROLENE) 0.05 % ointment Apply topically.    . calcium-vitamin D (OSCAL WITH D) 500-200 MG-UNIT tablet Take 1 tablet by mouth daily with breakfast.    . clobetasol (TEMOVATE) 0.05 % external solution Twice daily as needed for rash and scaling in scalp    . FLUoxetine (PROZAC) 40 MG capsule Take 1 capsule (40 mg total) by mouth daily. 30 capsule 1  . furosemide (LASIX) 20 MG tablet Take 20 mg by mouth daily.     Marland Kitchen levothyroxine (SYNTHROID, LEVOTHROID) 112 MCG tablet Take 112 mcg by mouth daily before breakfast.    . meloxicam (MOBIC) 15 MG tablet Take 15 mg by mouth daily.    Marland Kitchen omeprazole (PRILOSEC) 40 MG capsule Take 40 mg by mouth daily.     Marland Kitchen tiotropium (SPIRIVA) 18 MCG inhalation capsule Place 18 mcg into inhaler and inhale daily.     . traZODone (DESYREL) 50 MG tablet Take 1 tablet (50 mg total) by mouth at bedtime. 30 tablet 2  . valACYclovir (VALTREX) 500 MG tablet Take 500 mg by mouth daily.      No current facility-administered medications for this visit.     Medical Decision Making:  Established Problem, Stable/Improving (1), Review of Medication Regimen & Side Effects (2) and Review of New Medication or Change in Dosage (2)  Treatment Plan Summary:Medication management and Plan    Major depressive disorder, recurrent, severe with psychotic features  Decrease her Abilify to 10 mg daily Trazodone 50 mg at bedtime  Continue  Prozac at 40 mg daily.  Patient recommended to monitor her mood symptoms and call before if necessary  Patient will follow up in 1 months.   Cordel Drewes 11/28/2015, 9:09 AM

## 2015-12-04 ENCOUNTER — Other Ambulatory Visit: Payer: Self-pay | Admitting: Family Medicine

## 2015-12-04 ENCOUNTER — Ambulatory Visit
Admission: RE | Admit: 2015-12-04 | Discharge: 2015-12-04 | Disposition: A | Discharge status: Home / Self Care | Payer: Medicare Other | Source: Ambulatory Visit | Attending: Family Medicine | Admitting: Family Medicine

## 2015-12-04 DIAGNOSIS — Z1231 Encounter for screening mammogram for malignant neoplasm of breast: Secondary | ICD-10-CM | POA: Insufficient documentation

## 2015-12-25 ENCOUNTER — Encounter: Payer: Self-pay | Admitting: Psychiatry

## 2015-12-25 ENCOUNTER — Ambulatory Visit: Payer: 59 | Admitting: Psychiatry

## 2015-12-25 VITALS — BP 122/74 | HR 77 | Temp 98.4°F | Ht 61.0 in | Wt 206.8 lb

## 2015-12-25 DIAGNOSIS — F332 Major depressive disorder, recurrent severe without psychotic features: Secondary | ICD-10-CM

## 2015-12-25 DIAGNOSIS — F4310 Post-traumatic stress disorder, unspecified: Secondary | ICD-10-CM

## 2015-12-25 MED ORDER — FLUOXETINE HCL 40 MG PO CAPS: 40.0000 mg | ORAL_CAPSULE | Freq: Every day | ORAL | 1 refills | Status: DC

## 2015-12-25 MED ORDER — ARIPIPRAZOLE 10 MG PO TABS: 10.0000 mg | ORAL_TABLET | Freq: Every day | ORAL | 1 refills | Status: DC

## 2015-12-25 NOTE — Progress Notes (Signed)
Patient ID: Patricia Good, female   DOB: 1960/04/28, 55 y.o.   MRN: [id number]   [facility] MD/PA/NP OP Progress Note  12/25/2015 9:48 AM Patricia Good  MRN:  [id number]  Subjective:  Patient returns for follow-up of her major depressive disorder. Reports tolerating the decrease in abilify well. Not sleeping as much. She is continuing with therapy and states it is going well. Fair sleep and appetite. Continues to hear voices in the background but states they do not bother her. Patient denies suicidal thoughts.   Chief Complaint: doing better  Chief Complaint    Follow-up; Medication Refill     Visit Diagnosis:     ICD-9-CM ICD-10-CM   1. Severe episode of recurrent major depressive disorder, without psychotic features ([place]) 296.33 F33.2 ARIPiprazole (ABILIFY) 10 MG tablet  2. PTSD (post-traumatic stress disorder) 309.81 F43.10     Past Medical History:  Past Medical History:  Diagnosis Date  . ADHD (attention deficit hyperactivity disorder)   . Anxiety   . Asthma   . PTSD (post-traumatic stress disorder)   . Schizoaffective disorder [facility])     Past Surgical History:  Procedure Laterality Date  . ABDOMINAL HYSTERECTOMY    . TUBAL LIGATION     Family History:  Family History  Problem Relation Age of Onset  . Hypertension Father   . Diabetes Father   . Heart disease Father   . Blindness Father   . Obesity Brother   . Arthritis Brother   . Alcohol abuse Brother   . Drug abuse Brother   . Depression Brother   . Alcohol abuse Sister   . Drug abuse Sister   . Anxiety disorder Sister   . Depression Sister   . Breast cancer Neg Hx    Social History:  Social History   Social History  . Marital status: Single    Spouse name: N/A  . Number of children: N/A  . Years of education: N/A   Social History Main Topics  . Smoking status: Current Some Day Smoker    Types: Cigarettes    Start date: 01/22/1969  . Smokeless tobacco: Never Used  . Alcohol use 0.6 - 1.2 oz/week    1  Glasses of wine per week  . Drug use: No  . Sexual activity: Not Currently   Other Topics Concern  . None   Social History Narrative  . None   Additional History:   Assessment:   Musculoskeletal: Strength & Muscle Tone: within normal limits Gait & Station: normal Patient leans: N/A  Psychiatric Specialty Exam: Medication Refill   Anxiety       ROS  Blood pressure 122/74, pulse 77, temperature 98.4 F (36.9 C), temperature source Oral, height 5\' 1"  (1.549 m), weight 206 lb 12.8 oz (93.8 kg).Body mass index is 39.07 kg/m.  General Appearance: Casual   Eye Contact:  Good  Speech:  Slow   Volume:  Decreased   Mood:  improved  Affect:  Better, smiling  Thought Process:  Linear  Orientation:  Full (Time, Place, and Person)  Thought Content:  Negative  Suicidal Thoughts:  No  Homicidal Thoughts:  No  Memory:  Immediate;   Good Recent;   Good Remote;   Good  Judgement:  Good  Insight:  Good  Psychomotor Activity:  Negative  Concentration:  Good  Recall:  Good  Fund of Knowledge: Good  Language: Good  Akathisia:  Negative  Handed:  Right  AIMS (if indicated):  Done on 07/02/2015,  some cog wheeling of right arm  Assets:  Communication Skills Desire for Improvement Housing  ADL's:  Intact  Cognition: WNL  Sleep:  good   Is the patient at risk to self?  No. Has the patient been a risk to self in the past 6 months?  yes Has the patient been a risk to self within the distant past?  No. Is the patient a risk to others?  No. Has the patient been a risk to others in the past 6 months?  No. Has the patient been a risk to others within the distant past?  No.  Current Medications: Current Outpatient Prescriptions  Medication Sig Dispense Refill  . albuterol (PROAIR HFA) 108 (90 BASE) MCG/ACT inhaler Inhale 1-2 puffs into the lungs every 4 (four) hours as needed.     Marland Kitchen amLODipine (NORVASC) 10 MG tablet Take 10 mg by mouth daily.    . ARIPiprazole (ABILIFY) 10 MG  tablet Take 1 tablet (10 mg total) by mouth daily. 30 tablet 1  . aspirin EC 81 MG tablet Take 81 mg by mouth daily.    . B Complex-C (B-COMPLEX WITH VITAMIN C) tablet Take 1 tablet by mouth daily.    . beclomethasone (QVAR) 80 MCG/ACT inhaler Inhale 2 puffs into the lungs 2 (two) times daily.    . betamethasone dipropionate (DIPROLENE) 0.05 % ointment Apply topically.    . calcium-vitamin D (OSCAL WITH D) 500-200 MG-UNIT tablet Take 1 tablet by mouth daily with breakfast.    . clobetasol (TEMOVATE) 0.05 % external solution Twice daily as needed for rash and scaling in scalp    . furosemide (LASIX) 20 MG tablet Take 20 mg by mouth daily.     Marland Kitchen levothyroxine (SYNTHROID, LEVOTHROID) 112 MCG tablet Take 112 mcg by mouth daily before breakfast.    . meloxicam (MOBIC) 15 MG tablet Take 15 mg by mouth daily.    Marland Kitchen omeprazole (PRILOSEC) 40 MG capsule Take 40 mg by mouth daily.     Marland Kitchen tiotropium (SPIRIVA) 18 MCG inhalation capsule Place 18 mcg into inhaler and inhale daily.     . traZODone (DESYREL) 50 MG tablet Take 1 tablet (50 mg total) by mouth at bedtime. 30 tablet 2  . valACYclovir (VALTREX) 500 MG tablet Take 500 mg by mouth daily.     Marland Kitchen FLUoxetine (PROZAC) 40 MG capsule Take 1 capsule (40 mg total) by mouth daily. 30 capsule 1   No current facility-administered medications for this visit.     Medical Decision Making:  Established Problem, Stable/Improving (1), Review of Medication Regimen & Side Effects (2) and Review of New Medication or Change in Dosage (2)  Treatment Plan Summary:Medication management and Plan    Major depressive disorder, recurrent, severe with psychotic features  Continue her Abilify at 10 mg daily Trazodone 50 mg at bedtime  Continue  Prozac at 40 mg daily.  Patient recommended to monitor her mood symptoms and call before if necessary  Patient will follow up in 2 months.   Alexandera Kuntzman 12/25/2015, 9:48 AM

## 2016-01-06 ENCOUNTER — Encounter: Payer: Self-pay | Admitting: Emergency Medicine

## 2016-01-06 ENCOUNTER — Emergency Department
Admission: EM | Admit: 2016-01-06 | Discharge: 2016-01-06 | Disposition: A | Discharge status: Home / Self Care | Payer: Medicare Other | Attending: Emergency Medicine | Admitting: Emergency Medicine

## 2016-01-06 DIAGNOSIS — Z5321 Procedure and treatment not carried out due to patient leaving prior to being seen by health care provider: Secondary | ICD-10-CM | POA: Insufficient documentation

## 2016-01-06 DIAGNOSIS — M79602 Pain in left arm: Secondary | ICD-10-CM | POA: Diagnosis not present

## 2016-01-06 DIAGNOSIS — Z791 Long term (current) use of non-steroidal anti-inflammatories (NSAID): Secondary | ICD-10-CM | POA: Insufficient documentation

## 2016-01-06 DIAGNOSIS — F909 Attention-deficit hyperactivity disorder, unspecified type: Secondary | ICD-10-CM | POA: Insufficient documentation

## 2016-01-06 DIAGNOSIS — F17210 Nicotine dependence, cigarettes, uncomplicated: Secondary | ICD-10-CM | POA: Insufficient documentation

## 2016-01-06 DIAGNOSIS — Z79899 Other long term (current) drug therapy: Secondary | ICD-10-CM | POA: Insufficient documentation

## 2016-01-06 DIAGNOSIS — J45909 Unspecified asthma, uncomplicated: Secondary | ICD-10-CM | POA: Insufficient documentation

## 2016-01-06 DIAGNOSIS — Z7982 Long term (current) use of aspirin: Secondary | ICD-10-CM | POA: Diagnosis not present

## 2016-01-06 NOTE — ED Notes (Signed)
Pt not answering when called for room 

## 2016-01-06 NOTE — ED Triage Notes (Addendum)
Pt presents to ED with c/o left arm pain for a week. Denies recent injury. No deformity noted.

## 2016-02-12 ENCOUNTER — Telehealth: Payer: Self-pay

## 2016-02-12 NOTE — Telephone Encounter (Signed)
Medication management - Patient left a message requesting permission from Dr. Einar Grad to stop Trazodone medication.  Patient stated she does not like it and does not use often as only fills once every 4-5 months.  Requests permission to stop.Medication management - Patient left a message requesting permission from Dr. Einar Grad to stop Trazodone medication.  Patient stated she does not like it and does not use often as only fills once every 4-5 months.  Requests permission to stop. Patient returns for next evaluation 02/24/16.

## 2016-02-12 NOTE — Telephone Encounter (Signed)
Sure, thank you.

## 2016-02-12 NOTE — Telephone Encounter (Signed)
Medication management - Telephone call back with patient to inform Dr. Einar Grad approved her stopping of Trazodone medication.  Patient to call back if any problems and to keep appointment set for 02/24/16.  Medication discontinued as approved by Dr. Einar Grad this date and informed to patient as she requested.

## 2016-02-12 NOTE — Addendum Note (Signed)
Addended by: Watt Climes on: 02/12/2016 01:41 PM   Modules accepted: Orders

## 2016-02-24 ENCOUNTER — Ambulatory Visit: Payer: 59 | Admitting: Psychiatry

## 2016-03-10 ENCOUNTER — Ambulatory Visit (INDEPENDENT_AMBULATORY_CARE_PROVIDER_SITE_OTHER): Payer: 59 | Admitting: Psychiatry

## 2016-03-10 ENCOUNTER — Encounter: Payer: Self-pay | Admitting: Psychiatry

## 2016-03-10 VITALS — BP 116/74 | HR 74 | Temp 98.5°F | Wt 206.6 lb

## 2016-03-10 DIAGNOSIS — F332 Major depressive disorder, recurrent severe without psychotic features: Secondary | ICD-10-CM | POA: Diagnosis not present

## 2016-03-10 DIAGNOSIS — F4310 Post-traumatic stress disorder, unspecified: Secondary | ICD-10-CM | POA: Diagnosis not present

## 2016-03-10 MED ORDER — ARIPIPRAZOLE 10 MG PO TABS: 10.0000 mg | ORAL_TABLET | Freq: Every day | ORAL | 2 refills | Status: DC

## 2016-03-10 MED ORDER — FLUOXETINE HCL 40 MG PO CAPS: 40.0000 mg | ORAL_CAPSULE | Freq: Every day | ORAL | 2 refills | Status: DC

## 2016-03-10 NOTE — Progress Notes (Signed)
Patient ID: Patricia Good, female   DOB: 1960-05-28, 55 y.o.   MRN: [id number]   [facility] MD/PA/NP OP Progress Note  03/10/2016 10:29 AM Patricia Good  MRN:  [id number]  Subjective:  Patient returns for follow-up of her major depressive disorder. She reports doing quite well on the regimen off the Abilify at 10 mg and Prozac at 40 mg. we had reduced her Abilify in the past to 10 and she states that at this dose she is doing much better. Patient had also called in between and stated she was not taking her trazodone anymore since it made her too groggy. Patient presents very well today with a normal tone of voice and states she is feeling the best she has. Denies any suicidal thoughts. Denies hearing voices. She is looking forward to Christmas with her family. Reports having good energy and enjoying her life in general.   Chief Complaint: doing well.  Chief Complaint    Follow-up; Medication Refill     Visit Diagnosis:     ICD-9-CM ICD-10-CM   1. Severe episode of recurrent major depressive disorder, without psychotic features ([place]) 296.33 F33.2   2. PTSD (post-traumatic stress disorder) 309.81 F43.10     Past Medical History:  Past Medical History:  Diagnosis Date  . ADHD (attention deficit hyperactivity disorder)   . Anxiety   . Asthma   . PTSD (post-traumatic stress disorder)   . Schizoaffective disorder [facility])     Past Surgical History:  Procedure Laterality Date  . ABDOMINAL HYSTERECTOMY    . TUBAL LIGATION     Family History:  Family History  Problem Relation Age of Onset  . Hypertension Father   . Diabetes Father   . Heart disease Father   . Blindness Father   . Obesity Brother   . Arthritis Brother   . Alcohol abuse Brother   . Drug abuse Brother   . Depression Brother   . Alcohol abuse Sister   . Drug abuse Sister   . Anxiety disorder Sister   . Depression Sister   . Breast cancer Neg Hx    Social History:  Social History   Social History  . Marital status:  Single    Spouse name: N/A  . Number of children: N/A  . Years of education: N/A   Social History Main Topics  . Smoking status: Current Some Day Smoker    Types: Cigarettes    Start date: 01/22/1969  . Smokeless tobacco: Never Used  . Alcohol use 0.6 - 1.2 oz/week    1 Glasses of wine per week  . Drug use: No  . Sexual activity: Not Currently   Other Topics Concern  . None   Social History Narrative  . None   Additional History:   Assessment:   Musculoskeletal: Strength & Muscle Tone: within normal limits Gait & Station: normal Patient leans: N/A  Psychiatric Specialty Exam: Medication Refill   Anxiety       ROS  Blood pressure 116/74, pulse 74, temperature 98.5 F (36.9 C), temperature source Oral, weight 206 lb 9.6 oz (93.7 kg).Body mass index is 39.04 kg/m.  General Appearance: Casual   Eye Contact:  Good  Speech:  normal  Volume:  normal  Mood:  improved  Affect:  Better, smiling  Thought Process:  Linear  Orientation:  Full (Time, Place, and Person)  Thought Content:  Negative  Suicidal Thoughts:  No  Homicidal Thoughts:  No  Memory:  Immediate;   Good  Recent;   Good Remote;   Good  Judgement:  Good  Insight:  Good  Psychomotor Activity:  Negative  Concentration:  Good  Recall:  Good  Fund of Knowledge: Good  Language: Good  Akathisia:  Negative  Handed:  Right  AIMS (if indicated):  Done on 07/02/2015, some cog wheeling of right arm  Assets:  Communication Skills Desire for Improvement Housing  ADL's:  Intact  Cognition: WNL  Sleep:  good   Is the patient at risk to self?  No. Has the patient been a risk to self in the past 6 months?  yes Has the patient been a risk to self within the distant past?  No. Is the patient a risk to others?  No. Has the patient been a risk to others in the past 6 months?  No. Has the patient been a risk to others within the distant past?  No.  Current Medications: Current Outpatient Prescriptions   Medication Sig Dispense Refill  . albuterol (PROAIR HFA) 108 (90 BASE) MCG/ACT inhaler Inhale 1-2 puffs into the lungs every 4 (four) hours as needed.     Marland Kitchen amLODipine (NORVASC) 10 MG tablet Take 10 mg by mouth daily.    . ARIPiprazole (ABILIFY) 10 MG tablet Take 1 tablet (10 mg total) by mouth daily. 30 tablet 1  . aspirin EC 81 MG tablet Take 81 mg by mouth daily.    . B Complex-C (B-COMPLEX WITH VITAMIN C) tablet Take 1 tablet by mouth daily.    . beclomethasone (QVAR) 80 MCG/ACT inhaler Inhale 2 puffs into the lungs 2 (two) times daily.    . calcium-vitamin D (OSCAL WITH D) 500-200 MG-UNIT tablet Take 1 tablet by mouth daily with breakfast.    . clobetasol (TEMOVATE) 0.05 % external solution Twice daily as needed for rash and scaling in scalp    . FLUoxetine (PROZAC) 40 MG capsule Take 1 capsule (40 mg total) by mouth daily. 30 capsule 1  . furosemide (LASIX) 20 MG tablet Take 20 mg by mouth daily.     Marland Kitchen levothyroxine (SYNTHROID, LEVOTHROID) 112 MCG tablet Take 112 mcg by mouth daily before breakfast.    . meloxicam (MOBIC) 15 MG tablet Take 15 mg by mouth daily.    Marland Kitchen omeprazole (PRILOSEC) 40 MG capsule Take 40 mg by mouth daily.     Marland Kitchen tiotropium (SPIRIVA) 18 MCG inhalation capsule Place 18 mcg into inhaler and inhale daily.     . valACYclovir (VALTREX) 500 MG tablet Take 500 mg by mouth daily.      No current facility-administered medications for this visit.     Medical Decision Making:  Established Problem, Stable/Improving (1), Review of Medication Regimen & Side Effects (2) and Review of New Medication or Change in Dosage (2)  Treatment Plan Summary:Medication management and Plan    Major depressive disorder, recurrent, severe with psychotic features  Continue her Abilify at 10 mg daily Discontinue trazodone. Continue  Prozac at 40 mg daily.  Patient recommended to monitor her mood symptoms and call before if necessary  Patient will follow up in 3 months.   Muaaz Brau,  Lachelle Rissler 03/10/2016, 10:29 AM

## 2016-05-06 ENCOUNTER — Ambulatory Visit: Payer: 59 | Admitting: Psychiatry

## 2016-05-26 ENCOUNTER — Ambulatory Visit: Payer: 59 | Admitting: Psychiatry

## 2016-06-09 ENCOUNTER — Ambulatory Visit: Payer: 59 | Admitting: Psychiatry

## 2016-12-01 ENCOUNTER — Ambulatory Visit: Payer: Medicare Other | Admitting: Family

## 2016-12-30 IMAGING — MG MM DIGITAL SCREENING BILAT W/ TOMO W/ CAD
8 of 12 series · 8 of 28 positions shown · non-contrast
Comparison: None.

CLINICAL DATA: Screening.

EXAM:
2D DIGITAL SCREENING BILATERAL MAMMOGRAM WITH CAD AND ADJUNCT TOMO

[R CC]
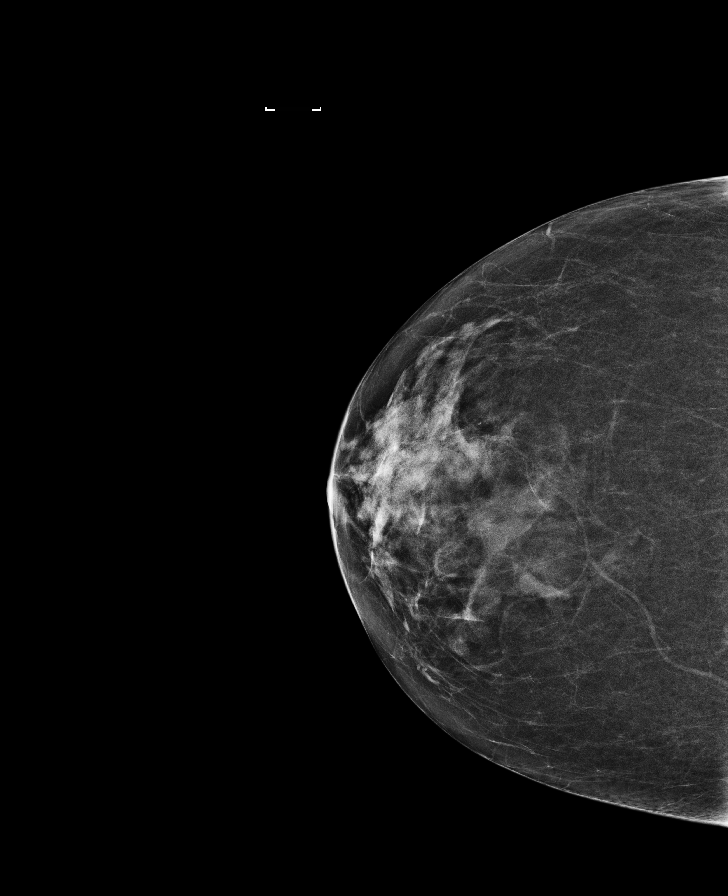

[L MLO synth-2D]
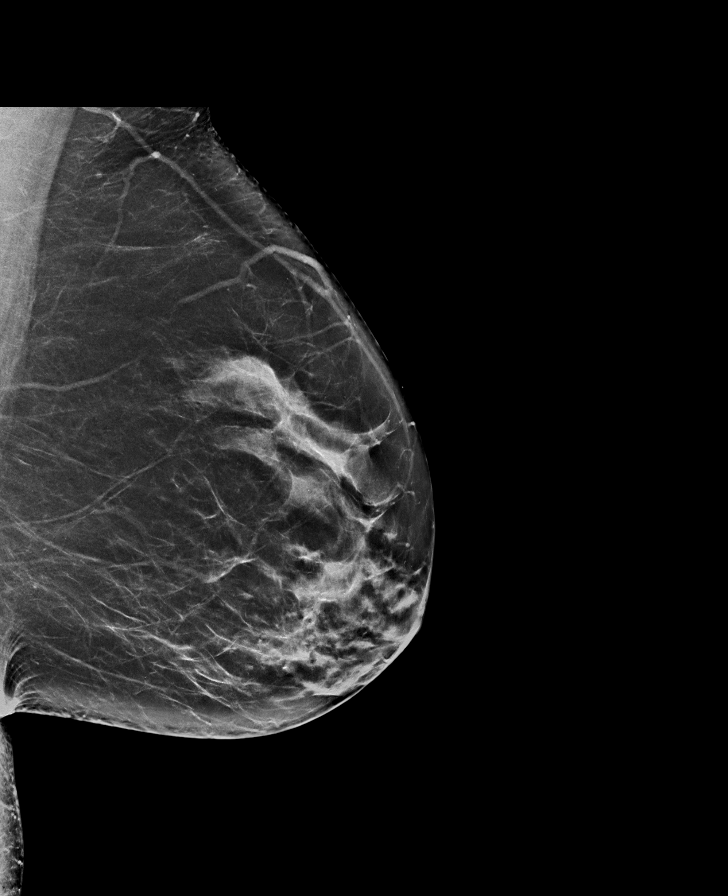

[L CC synth-2D]
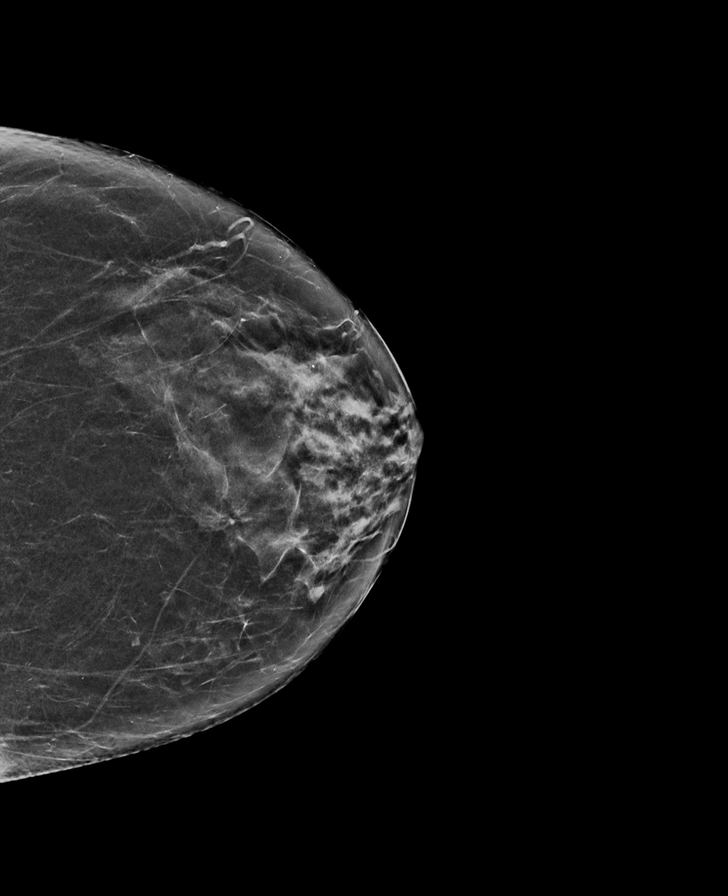

[L CC]
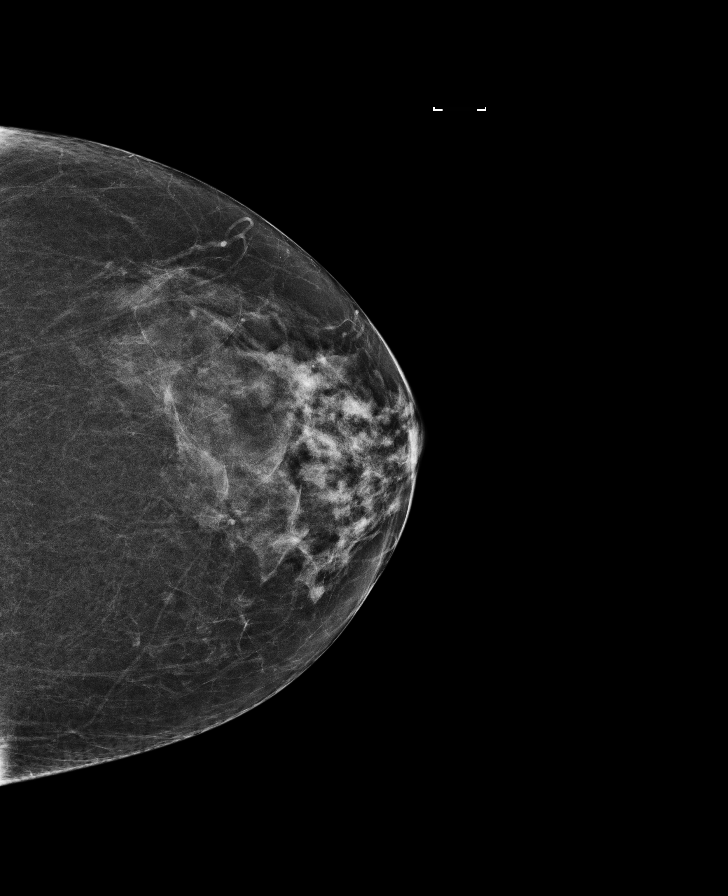

[R MLO synth-2D]
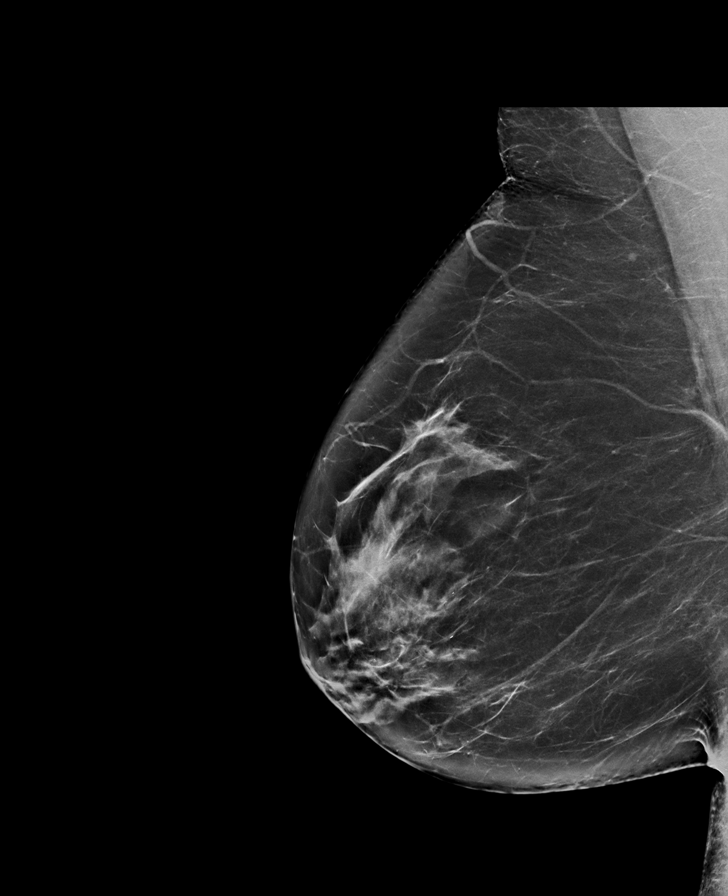

[L MLO]
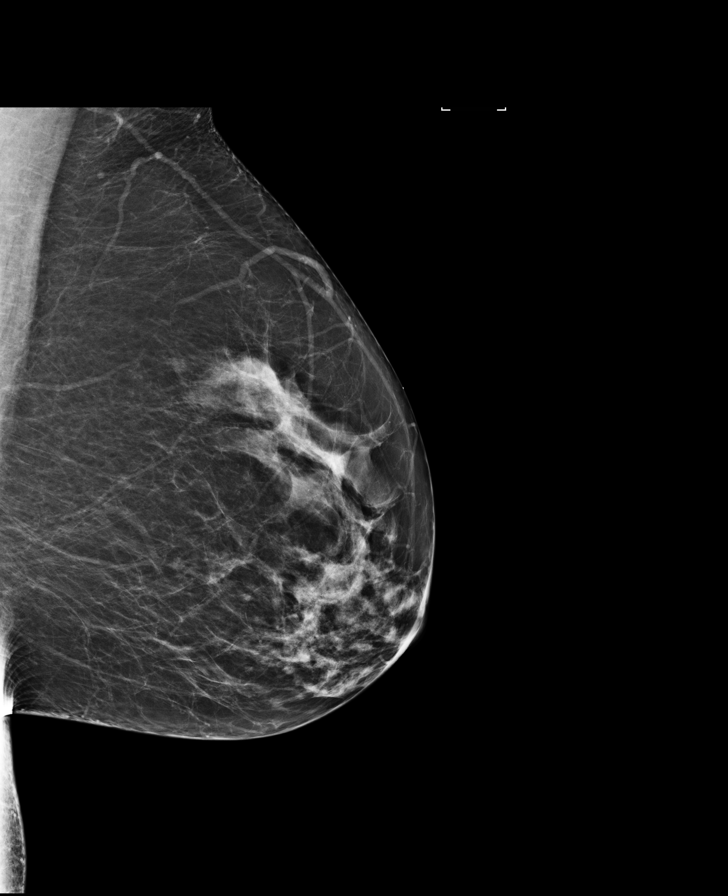

[R MLO]
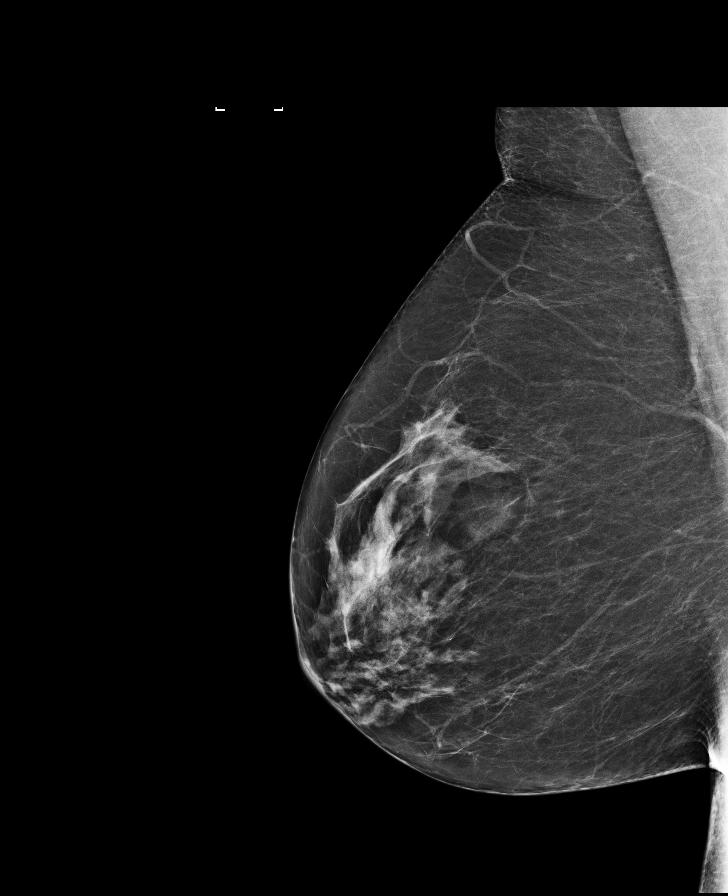

[R CC synth-2D]
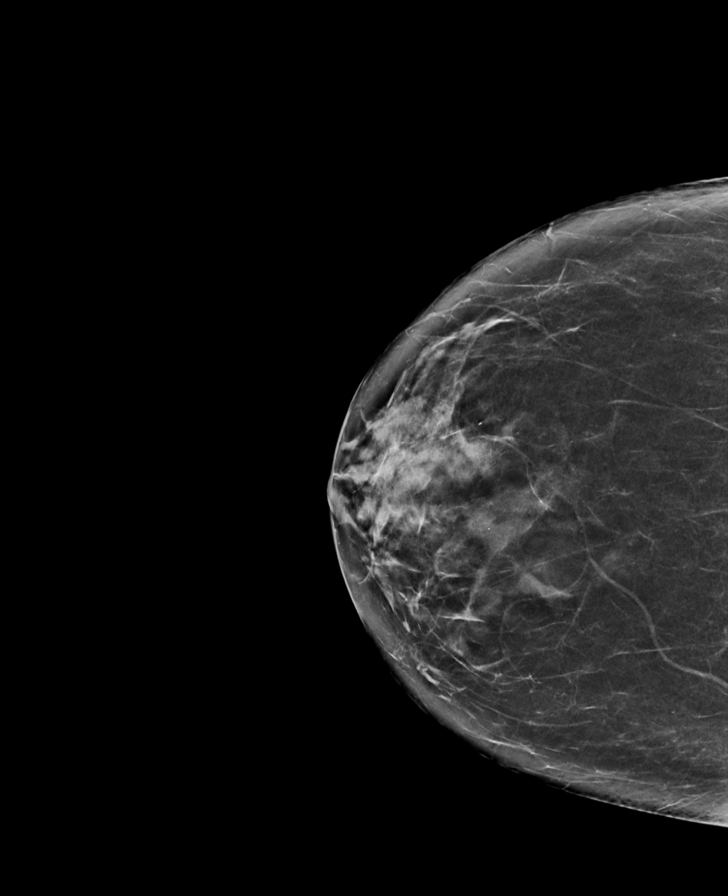

[8 of 28 positions shown; findings below may reference images not displayed]

ACR Breast Density Category b: There are scattered areas of
fibroglandular density.
FINDINGS: There are no findings suspicious for malignancy. Images were
processed with CAD.
IMPRESSION: No mammographic evidence of malignancy. A result letter of this
screening mammogram will be mailed directly to the patient.

RECOMMENDATION:
Screening mammogram in one year. (Code:[id number])

BI-RADS CATEGORY  1: Negative.

## 2017-01-05 ENCOUNTER — Encounter: Payer: Self-pay | Admitting: Emergency Medicine

## 2017-01-05 DIAGNOSIS — R0789 Other chest pain: Secondary | ICD-10-CM | POA: Insufficient documentation

## 2017-01-05 DIAGNOSIS — R252 Cramp and spasm: Secondary | ICD-10-CM | POA: Diagnosis present

## 2017-01-05 DIAGNOSIS — Z5321 Procedure and treatment not carried out due to patient leaving prior to being seen by health care provider: Secondary | ICD-10-CM | POA: Insufficient documentation

## 2017-01-05 LAB — COMPREHENSIVE METABOLIC PANEL
ALBUMIN: 3.8 g/dL (ref 3.5–5.0)
ALK PHOS: 128 U/L — AB (ref 38–126)
ALT: 39 U/L (ref 14–54)
ANION GAP: 9 (ref 5–15)
AST: 32 U/L (ref 15–41)
BILIRUBIN TOTAL: 0.4 mg/dL (ref 0.3–1.2)
BUN: 19 mg/dL (ref 6–20)
CALCIUM: 9.3 mg/dL (ref 8.9–10.3)
CO2: 28 mmol/L (ref 22–32)
Chloride: 96 mmol/L — ABNORMAL LOW (ref 101–111)
Creatinine, Ser: 1.48 mg/dL — ABNORMAL HIGH (ref 0.44–1.00)
GFR, EST AFRICAN AMERICAN: 44 mL/min — AB (ref >60)
GFR, EST NON AFRICAN AMERICAN: 38 mL/min — AB (ref >60)
GLUCOSE: 114 mg/dL — AB (ref 65–99)
Potassium: 4.4 mmol/L (ref 3.5–5.1)
Sodium: 133 mmol/L — ABNORMAL LOW (ref 135–145)
TOTAL PROTEIN: 8.4 g/dL — AB (ref 6.5–8.1)

## 2017-01-05 LAB — URINALYSIS, COMPLETE (UACMP) WITH MICROSCOPIC
BILIRUBIN URINE: NEGATIVE
Glucose, UA: NEGATIVE mg/dL
KETONES UR: NEGATIVE mg/dL
Nitrite: NEGATIVE
PH: 6 (ref 5.0–8.0)
Protein, ur: 30 mg/dL — AB
Specific Gravity, Urine: 1.005 (ref 1.005–1.030)

## 2017-01-05 LAB — CBC
HCT: 38.9 % (ref 35.0–47.0)
HEMOGLOBIN: 13.8 g/dL (ref 12.0–16.0)
MCH: 37.4 pg — AB (ref 26.0–34.0)
MCHC: 35.4 g/dL (ref 32.0–36.0)
MCV: 105.8 fL — AB (ref 80.0–100.0)
Platelets: 240 10*3/uL (ref 150–440)
RBC: 3.68 MIL/uL — AB (ref 3.80–5.20)
RDW: 13.2 % (ref 11.5–14.5)
WBC: 10.2 10*3/uL (ref 3.6–11.0)

## 2017-01-05 NOTE — ED Triage Notes (Signed)
Patient to ER for c/o muscle cramping to extremities and muscles at rib cage bilaterally intermittently. States problem has been happening for approx 1 year, but blood work has been normal.

## 2017-01-06 ENCOUNTER — Emergency Department
Admission: EM | Admit: 2017-01-06 | Discharge: 2017-01-06 | Disposition: A | Discharge status: Home / Self Care | Payer: Medicare Other | Attending: Emergency Medicine | Admitting: Emergency Medicine

## 2017-01-06 HISTORY — DX: Other specified abnormal findings of blood chemistry: R79.89

## 2017-01-06 NOTE — ED Notes (Addendum)
Patient states she no longer wants to wait, that she is feeling much better.

## 2017-01-07 ENCOUNTER — Telehealth: Payer: Self-pay | Admitting: Emergency Medicine

## 2017-01-07 NOTE — Telephone Encounter (Signed)
Called patient due to lwot to inquire about condition and follow up plans. Number is not accepting calls right now.   [employer] and had message sent to Abby Bender--pcp--to inform of labs done here.

## 2017-02-11 ENCOUNTER — Other Ambulatory Visit: Payer: Self-pay

## 2017-02-11 ENCOUNTER — Emergency Department
Admission: EM | Admit: 2017-02-11 | Discharge: 2017-02-13 | Disposition: A | Discharge status: Other Acute Inpatient Hospital | Payer: Medicare Other | Attending: Student in an Organized Health Care Education/Training Program | Admitting: Student in an Organized Health Care Education/Training Program

## 2017-02-11 ENCOUNTER — Encounter: Payer: Self-pay | Admitting: Emergency Medicine

## 2017-02-11 DIAGNOSIS — F259 Schizoaffective disorder, unspecified: Secondary | ICD-10-CM | POA: Diagnosis not present

## 2017-02-11 DIAGNOSIS — J45909 Unspecified asthma, uncomplicated: Secondary | ICD-10-CM | POA: Insufficient documentation

## 2017-02-11 DIAGNOSIS — F4310 Post-traumatic stress disorder, unspecified: Secondary | ICD-10-CM | POA: Diagnosis present

## 2017-02-11 DIAGNOSIS — F4312 Post-traumatic stress disorder, chronic: Secondary | ICD-10-CM | POA: Diagnosis present

## 2017-02-11 DIAGNOSIS — F315 Bipolar disorder, current episode depressed, severe, with psychotic features: Secondary | ICD-10-CM | POA: Diagnosis not present

## 2017-02-11 DIAGNOSIS — Z79899 Other long term (current) drug therapy: Secondary | ICD-10-CM | POA: Diagnosis not present

## 2017-02-11 DIAGNOSIS — R45851 Suicidal ideations: Secondary | ICD-10-CM | POA: Insufficient documentation

## 2017-02-11 DIAGNOSIS — R45850 Homicidal ideations: Secondary | ICD-10-CM | POA: Diagnosis not present

## 2017-02-11 DIAGNOSIS — E079 Disorder of thyroid, unspecified: Secondary | ICD-10-CM | POA: Insufficient documentation

## 2017-02-11 DIAGNOSIS — I10 Essential (primary) hypertension: Secondary | ICD-10-CM | POA: Diagnosis present

## 2017-02-11 DIAGNOSIS — F17210 Nicotine dependence, cigarettes, uncomplicated: Secondary | ICD-10-CM | POA: Diagnosis not present

## 2017-02-11 LAB — URINALYSIS, COMPLETE (UACMP) WITH MICROSCOPIC
Bacteria, UA: NONE SEEN
Bilirubin Urine: NEGATIVE
Glucose, UA: NEGATIVE mg/dL
Ketones, ur: NEGATIVE mg/dL
Nitrite: NEGATIVE
PROTEIN: 30 mg/dL — AB
SPECIFIC GRAVITY, URINE: 1.009 (ref 1.005–1.030)
pH: 6 (ref 5.0–8.0)

## 2017-02-11 LAB — CBC
HCT: 35.6 % (ref 35.0–47.0)
Hemoglobin: 12.5 g/dL (ref 12.0–16.0)
MCH: 37.2 pg — AB (ref 26.0–34.0)
MCHC: 35.1 g/dL (ref 32.0–36.0)
MCV: 106.2 fL — AB (ref 80.0–100.0)
PLATELETS: 224 10*3/uL (ref 150–440)
RBC: 3.36 MIL/uL — ABNORMAL LOW (ref 3.80–5.20)
RDW: 13.1 % (ref 11.5–14.5)
WBC: 7.8 10*3/uL (ref 3.6–11.0)

## 2017-02-11 LAB — URINE DRUG SCREEN, QUALITATIVE (ARMC ONLY)
AMPHETAMINES, UR SCREEN: NOT DETECTED
BARBITURATES, UR SCREEN: NOT DETECTED
BENZODIAZEPINE, UR SCRN: NOT DETECTED
CANNABINOID 50 NG, UR ~~LOC~~: NOT DETECTED
Cocaine Metabolite,Ur ~~LOC~~: NOT DETECTED
MDMA (Ecstasy)Ur Screen: NOT DETECTED
Methadone Scn, Ur: NOT DETECTED
Opiate, Ur Screen: NOT DETECTED
Phencyclidine (PCP) Ur S: NOT DETECTED
Tricyclic, Ur Screen: NOT DETECTED

## 2017-02-11 LAB — ETHANOL

## 2017-02-11 LAB — COMPREHENSIVE METABOLIC PANEL
ALBUMIN: 3.6 g/dL (ref 3.5–5.0)
ALK PHOS: 129 U/L — AB (ref 38–126)
ALT: 40 U/L (ref 14–54)
AST: 29 U/L (ref 15–41)
Anion gap: 10 (ref 5–15)
BUN: 17 mg/dL (ref 6–20)
CO2: 25 mmol/L (ref 22–32)
Calcium: 8.8 mg/dL — ABNORMAL LOW (ref 8.9–10.3)
Chloride: 98 mmol/L — ABNORMAL LOW (ref 101–111)
Creatinine, Ser: 1.43 mg/dL — ABNORMAL HIGH (ref 0.44–1.00)
GFR calc non Af Amer: 40 mL/min — ABNORMAL LOW (ref >60)
GFR, EST AFRICAN AMERICAN: 46 mL/min — AB (ref >60)
Glucose, Bld: 105 mg/dL — ABNORMAL HIGH (ref 65–99)
Potassium: 3.9 mmol/L (ref 3.5–5.1)
SODIUM: 133 mmol/L — AB (ref 135–145)
TOTAL PROTEIN: 7.3 g/dL (ref 6.5–8.1)
Total Bilirubin: 0.5 mg/dL (ref 0.3–1.2)

## 2017-02-11 LAB — SALICYLATE LEVEL: Salicylate Lvl: <7 mg/dL (ref 2.8–30.0)

## 2017-02-11 LAB — ACETAMINOPHEN LEVEL: Acetaminophen (Tylenol), Serum: <10 ug/mL — ABNORMAL LOW (ref 10–30)
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
## 2017-02-11 NOTE — ED Notes (Signed)
Report received from [facility]. Orders placed for telepsych and move orders. Patient to be moved to room 3.
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
## 2017-02-12 DIAGNOSIS — F315 Bipolar disorder, current episode depressed, severe, with psychotic features: Secondary | ICD-10-CM

## 2017-02-12 MED ORDER — PANTOPRAZOLE SODIUM 40 MG PO TBEC
40.0000 mg | DELAYED_RELEASE_TABLET | Freq: Every day | ORAL | Status: DC
  Administered 2017-02-12 – 2017-02-13 (×2): 40 mg via ORAL
  Filled 2017-02-12 (×2): qty 1

## 2017-02-12 MED ORDER — LEVOTHYROXINE SODIUM 75 MCG PO TABS
125.0000 ug | ORAL_TABLET | Freq: Every day | ORAL | Status: DC
  Administered 2017-02-12 – 2017-02-13 (×2): 125 ug via ORAL
  Filled 2017-02-12 (×2): qty 2

## 2017-02-12 MED ORDER — AMLODIPINE BESYLATE 5 MG PO TABS
10.0000 mg | ORAL_TABLET | Freq: Every day | ORAL | Status: DC
  Administered 2017-02-13: 10 mg via ORAL
  Filled 2017-02-12: qty 2

## 2017-02-12 MED ORDER — ASPIRIN EC 81 MG PO TBEC
81.0000 mg | DELAYED_RELEASE_TABLET | Freq: Every day | ORAL | Status: DC
  Administered 2017-02-12 – 2017-02-13 (×2): 81 mg via ORAL
  Filled 2017-02-12 (×2): qty 1

## 2017-02-12 MED ORDER — ARIPIPRAZOLE 10 MG PO TABS
10.0000 mg | ORAL_TABLET | Freq: Every day | ORAL | Status: DC
  Administered 2017-02-12 – 2017-02-13 (×2): 10 mg via ORAL
  Filled 2017-02-12 (×2): qty 1

## 2017-02-12 MED ORDER — FUROSEMIDE 20 MG PO TABS
20.0000 mg | ORAL_TABLET | Freq: Every day | ORAL | Status: DC
  Administered 2017-02-12 – 2017-02-13 (×2): 20 mg via ORAL
  Filled 2017-02-12 (×2): qty 1

## 2017-02-12 MED ORDER — ALBUTEROL SULFATE HFA 108 (90 BASE) MCG/ACT IN AERS
2.0000 | INHALATION_SPRAY | RESPIRATORY_TRACT | Status: DC | PRN
  Administered 2017-02-12 – 2017-02-13 (×2): 2 via RESPIRATORY_TRACT
  Filled 2017-02-12: qty 6.7

## 2017-02-12 MED ORDER — TIOTROPIUM BROMIDE MONOHYDRATE 18 MCG IN CAPS
18.0000 ug | ORAL_CAPSULE | Freq: Every day | RESPIRATORY_TRACT | Status: DC
  Administered 2017-02-12 – 2017-02-13 (×2): 18 ug via RESPIRATORY_TRACT
  Filled 2017-02-12: qty 5

## 2017-02-12 MED ORDER — FLUOXETINE HCL 20 MG PO CAPS
40.0000 mg | ORAL_CAPSULE | Freq: Every day | ORAL | Status: DC
  Administered 2017-02-12 – 2017-02-13 (×2): 40 mg via ORAL
  Filled 2017-02-12 (×2): qty 2

## 2017-02-12 NOTE — ED Notes (Signed)
Patient awake, ask for drink, she alert and oriented, no signs of distress. q 15 minute checks and camera surveillance in progress for safety.

## 2017-02-12 NOTE — ED Notes (Signed)
Report was received from Laymond Purser., RN; Pt. Verbalizes  complaints of having Depression; and distress of not feeling safe; verbalizes having S.I./Hi. Continue to monitor with 15 min. Monitoring.

## 2017-02-12 NOTE — ED Provider Notes (Signed)
-----------------------------------------   4:22 AM on 02/12/2017 -----------------------------------------  Spoke with [facility] psychiatrist Dr. Hinton Lovely who evaluate patient and recommends inpatient hospitalization.  No new medication recommendations.   Paulette Blanch, MD 02/12/17 [phone]
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
## 2017-02-12 NOTE — ED Notes (Signed)
Patient is asleep, and is safe, respirations even and unlabored, will give medications that were ordered as soon as pharmacy verifies, q 15 minute checks and camera surveillance in progress for safety.

## 2017-02-12 NOTE — ED Notes (Signed)
Pt is alert and oriented on admission to [facility]. Pt mood is depressed and her affect is sad. She endorses ongoing SI but does contract for safety and reports feeling safe at this time. Writer discussed tx plan and provided food and drink. 15 minute checks are ongoing for safety.

## 2017-02-12 NOTE — ED Notes (Signed)
Patient did eat 50% of breakfast and had beverage.
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
## 2017-02-12 NOTE — ED Notes (Signed)
Patient ate 100% of supper and beverage.  

## 2017-02-12 NOTE — ED Notes (Signed)
Dr. Weber Cooks is talking with patient.
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
## 2017-02-13 NOTE — BH Assessment (Signed)
Patient has been accepted at [facility] (per Kennyth Lose, Admissions Rep). Accepting MD:  Dr. Mina Marble Call Report:  [phone] Pt can report after 10 am to:  [facility] [address]  [place], [place] [address]

## 2017-02-13 NOTE — ED Notes (Signed)
Pt has been resting in bed. Pt showered without incident. Pt denies si/ah/vh/hi/and pain at this time. Pt was told she was going to [place]. Pt verbalized understanding. No s/sx of distress. Pt dressed in paper scrubs upon d/c. All personal belonging transported with patient.

## 2017-02-13 NOTE — ED Provider Notes (Signed)
-----------------------------------------   3:56 PM on 02/13/2017 -----------------------------------------   Blood pressure 136/82, pulse 72, temperature 98.3 F (36.8 C), resp. rate 18, weight 95.3 kg (210 lb), SpO2 94 %.  The patient had no acute events since last update.  Calm and cooperative at this time.  Disposition is pending Psychiatry/Behavioral Medicine team recommendations.     Nena Polio, MD 02/13/17 1556

## 2017-02-13 NOTE — ED Notes (Signed)
emtala reviewed by this RN 
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
## 2017-02-13 NOTE — BH Assessment (Signed)
Referral information for Placement have been faxed to: ? [facility] ([phone])  ? [facility] [phone])  ? [facility] [phone])  ? [facility] [phone])  ? [facility] [phone]
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
## 2017-05-24 ENCOUNTER — Emergency Department
Admission: EM | Admit: 2017-05-24 | Discharge: 2017-05-24 | Disposition: A | Discharge status: Home / Self Care | Payer: Medicare Other | Attending: Emergency Medicine | Admitting: Emergency Medicine

## 2017-05-24 ENCOUNTER — Encounter: Payer: Self-pay | Admitting: Emergency Medicine

## 2017-05-24 ENCOUNTER — Other Ambulatory Visit: Payer: Self-pay

## 2017-05-24 ENCOUNTER — Ambulatory Visit
Admission: EM | Admit: 2017-05-24 | Discharge: 2017-05-24 | Disposition: A | Discharge status: Home / Self Care | Payer: No Typology Code available for payment source | Attending: Emergency Medicine | Admitting: Emergency Medicine

## 2017-05-24 DIAGNOSIS — Z7982 Long term (current) use of aspirin: Secondary | ICD-10-CM | POA: Insufficient documentation

## 2017-05-24 DIAGNOSIS — Z79899 Other long term (current) drug therapy: Secondary | ICD-10-CM | POA: Insufficient documentation

## 2017-05-24 DIAGNOSIS — T7621XA Adult sexual abuse, suspected, initial encounter: Secondary | ICD-10-CM | POA: Insufficient documentation

## 2017-05-24 DIAGNOSIS — Z0441 Encounter for examination and observation following alleged adult rape: Secondary | ICD-10-CM | POA: Diagnosis not present

## 2017-05-24 DIAGNOSIS — J45909 Unspecified asthma, uncomplicated: Secondary | ICD-10-CM | POA: Diagnosis not present

## 2017-05-24 DIAGNOSIS — T7421XA Adult sexual abuse, confirmed, initial encounter: Secondary | ICD-10-CM

## 2017-05-24 DIAGNOSIS — F17210 Nicotine dependence, cigarettes, uncomplicated: Secondary | ICD-10-CM | POA: Diagnosis not present
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
## 2017-05-24 NOTE — SANE Note (Signed)
Follow-up Phone Call  Patient gives verbal consent for a FNE/SANE follow-up phone call in 48-72 hours: YES Patient's telephone number: [phone] or [phone] Patient gives verbal consent to leave voicemail at the phone number listed above: YES DO NOT CALL between the hours of: call anytime
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
## 2017-06-07 ENCOUNTER — Telehealth: Payer: Self-pay

## 2017-06-07 NOTE — Telephone Encounter (Signed)
Medication management - Telephone call with patient after she left a message requesting refills. Informed pt Dr. Einar Grad had not seen her since 03/10/16 and doubtful she would be willing to do this. Pt has a new appt. 06/14/17 with Dr. Shea Evans.  Patient admitted she had been seeing another provider after Dr. Shea Evans and requested she contact them for a refill until she begins services with Dr. Shea Evans and patient agreed with plan.

## 2017-06-07 NOTE — Telephone Encounter (Signed)
OK, thanks

## 2017-06-14 ENCOUNTER — Ambulatory Visit: Payer: 59 | Admitting: Psychiatry

## 2017-06-15 ENCOUNTER — Encounter: Payer: Self-pay | Admitting: Psychiatry

## 2017-06-15 ENCOUNTER — Other Ambulatory Visit: Payer: Self-pay

## 2017-06-15 ENCOUNTER — Ambulatory Visit (INDEPENDENT_AMBULATORY_CARE_PROVIDER_SITE_OTHER): Payer: Medicare Other | Admitting: Psychiatry

## 2017-06-15 VITALS — BP 113/75 | HR 83 | Temp 98.5°F | Wt 208.6 lb

## 2017-06-15 DIAGNOSIS — F4310 Post-traumatic stress disorder, unspecified: Secondary | ICD-10-CM | POA: Diagnosis not present

## 2017-06-15 DIAGNOSIS — F250 Schizoaffective disorder, bipolar type: Secondary | ICD-10-CM | POA: Diagnosis not present

## 2017-06-15 DIAGNOSIS — F17200 Nicotine dependence, unspecified, uncomplicated: Secondary | ICD-10-CM | POA: Diagnosis not present

## 2017-06-15 DIAGNOSIS — F4010 Social phobia, unspecified: Secondary | ICD-10-CM | POA: Diagnosis not present

## 2017-06-15 MED ORDER — BUPROPION HCL ER (XL) 150 MG PO TB24: 150.0000 mg | ORAL_TABLET | Freq: Every day | ORAL | 1 refills | Status: DC

## 2017-06-15 MED ORDER — QUETIAPINE FUMARATE 25 MG PO TABS: 12.5000 mg | ORAL_TABLET | ORAL | 1 refills | Status: DC | PRN

## 2017-06-15 MED ORDER — ARIPIPRAZOLE 20 MG PO TABS: 20.0000 mg | ORAL_TABLET | Freq: Every day | ORAL | 1 refills | Status: DC
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
## 2017-06-30 ENCOUNTER — Other Ambulatory Visit: Payer: Self-pay | Admitting: Family Medicine

## 2017-06-30 DIAGNOSIS — Z1231 Encounter for screening mammogram for malignant neoplasm of breast: Secondary | ICD-10-CM

## 2017-07-12 ENCOUNTER — Other Ambulatory Visit: Payer: Self-pay | Admitting: Psychiatry

## 2017-07-13 LAB — CBC WITH DIFFERENTIAL/PLATELET
BASOS ABS: 0 10*3/uL (ref 0.0–0.2)
Basos: 1 %
EOS (ABSOLUTE): 0.9 10*3/uL — AB (ref 0.0–0.4)
Eos: 11 %
Hematocrit: 36.5 % (ref 34.0–46.6)
Hemoglobin: 12.8 g/dL (ref 11.1–15.9)
Immature Grans (Abs): 0 10*3/uL (ref 0.0–0.1)
Immature Granulocytes: 0 %
LYMPHS ABS: 4 10*3/uL — AB (ref 0.7–3.1)
Lymphs: 48 %
MCH: 35.6 pg — AB (ref 26.6–33.0)
MCHC: 35.1 g/dL (ref 31.5–35.7)
MCV: 101 fL — ABNORMAL HIGH (ref 79–97)
MONOS ABS: 0.6 10*3/uL (ref 0.1–0.9)
Monocytes: 7 %
NEUTROS ABS: 2.8 10*3/uL (ref 1.4–7.0)
Neutrophils: 33 %
PLATELETS: 258 10*3/uL (ref 150–379)
RBC: 3.6 x10E6/uL — ABNORMAL LOW (ref 3.77–5.28)
RDW: 13.3 % (ref 12.3–15.4)
WBC: 8.3 10*3/uL (ref 3.4–10.8)

## 2017-07-13 LAB — COMPREHENSIVE METABOLIC PANEL
ALBUMIN: 4.3 g/dL (ref 3.5–5.5)
ALK PHOS: 138 IU/L — AB (ref 39–117)
ALT: 36 IU/L — AB (ref 0–32)
AST: 31 IU/L (ref 0–40)
Albumin/Globulin Ratio: 1.4 (ref 1.2–2.2)
BILIRUBIN TOTAL: 0.2 mg/dL (ref 0.0–1.2)
BUN/Creatinine Ratio: 9 (ref 9–23)
BUN: 13 mg/dL (ref 6–24)
CHLORIDE: 102 mmol/L (ref 96–106)
CO2: 23 mmol/L (ref 20–29)
Calcium: 8.6 mg/dL — ABNORMAL LOW (ref 8.7–10.2)
Creatinine, Ser: 1.37 mg/dL — ABNORMAL HIGH (ref 0.57–1.00)
GFR calc Af Amer: 49 mL/min/{1.73_m2} — ABNORMAL LOW (ref >59)
GFR calc non Af Amer: 42 mL/min/{1.73_m2} — ABNORMAL LOW (ref >59)
GLUCOSE: 106 mg/dL — AB (ref 65–99)
Globulin, Total: 3.1 g/dL (ref 1.5–4.5)
Potassium: 4 mmol/L (ref 3.5–5.2)
Sodium: 139 mmol/L (ref 134–144)
Total Protein: 7.4 g/dL (ref 6.0–8.5)

## 2017-07-13 LAB — VITAMIN D 25 HYDROXY (VIT D DEFICIENCY, FRACTURES): VIT D 25 HYDROXY: 38.4 ng/mL (ref 30.0–100.0)

## 2017-07-13 LAB — B12 AND FOLATE PANEL
Folate: >20 ng/mL (ref >3.0)
Vitamin B-12: 472 pg/mL (ref 232–1245)

## 2017-07-13 LAB — LIPID PANEL W/O CHOL/HDL RATIO
CHOLESTEROL TOTAL: 246 mg/dL — AB (ref 100–199)
HDL: 53 mg/dL (ref >39)
LDL Calculated: 174 mg/dL — ABNORMAL HIGH (ref 0–99)
Triglycerides: 95 mg/dL (ref 0–149)
VLDL CHOLESTEROL CAL: 19 mg/dL (ref 5–40)

## 2017-07-13 LAB — HGB A1C W/O EAG: HEMOGLOBIN A1C: 5.6 % (ref 4.8–5.6)

## 2017-07-13 LAB — TSH: TSH: 0.465 u[IU]/mL (ref 0.450–4.500)

## 2017-07-13 LAB — PROLACTIN: PROLACTIN: 1.2 ng/mL — AB (ref 4.8–23.3)

## 2017-07-16 ENCOUNTER — Other Ambulatory Visit: Payer: Self-pay

## 2017-07-16 ENCOUNTER — Encounter: Payer: Self-pay | Admitting: Psychiatry

## 2017-07-16 ENCOUNTER — Ambulatory Visit (INDEPENDENT_AMBULATORY_CARE_PROVIDER_SITE_OTHER): Payer: Medicaid Other | Admitting: Psychiatry

## 2017-07-16 VITALS — BP 121/77 | HR 82 | Temp 97.7°F | Wt 211.8 lb

## 2017-07-16 DIAGNOSIS — F5105 Insomnia due to other mental disorder: Secondary | ICD-10-CM

## 2017-07-16 DIAGNOSIS — F250 Schizoaffective disorder, bipolar type: Secondary | ICD-10-CM | POA: Diagnosis not present

## 2017-07-16 DIAGNOSIS — F17200 Nicotine dependence, unspecified, uncomplicated: Secondary | ICD-10-CM | POA: Diagnosis not present

## 2017-07-16 DIAGNOSIS — F4310 Post-traumatic stress disorder, unspecified: Secondary | ICD-10-CM | POA: Diagnosis not present

## 2017-07-16 DIAGNOSIS — F4010 Social phobia, unspecified: Secondary | ICD-10-CM

## 2017-07-16 MED ORDER — ARIPIPRAZOLE 20 MG PO TABS: 20.0000 mg | ORAL_TABLET | Freq: Every day | ORAL | 0 refills | Status: DC

## 2017-07-16 MED ORDER — BUPROPION HCL ER (XL) 150 MG PO TB24: 150.0000 mg | ORAL_TABLET | Freq: Every day | ORAL | 0 refills | Status: DC

## 2017-07-16 MED ORDER — QUETIAPINE FUMARATE 25 MG PO TABS: 12.5000 mg | ORAL_TABLET | ORAL | 0 refills | Status: DC | PRN

## 2017-07-16 MED ORDER — DOXEPIN HCL 10 MG PO CAPS: 10.0000 mg | ORAL_CAPSULE | Freq: Every day | ORAL | 1 refills | Status: DC
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
## 2017-07-16 NOTE — Patient Instructions (Signed)
Doxepin capsules What is this medicine? DOXEPIN (DOX e pin) is used to treat depression and anxiety. This medicine may be used for other purposes; ask your health care provider or pharmacist if you have questions. COMMON BRAND NAME(S): Sinequan What should I tell my health care provider before I take this medicine? They need to know if you have any of these conditions: -bipolar disorder -difficulty passing urine -glaucoma -heart disease -if you frequently drink alcohol containing drinks -liver disease -lung or breathing disease, like asthma or sleep apnea -prostate trouble -schizophrenia -seizures -suicidal thoughts, plans, or attempt; a previous suicide attempt by you or a family member -an unusual or allergic reaction to doxepin, other medicines, foods, dyes, or preservatives -pregnant or trying to get pregnant -breast-feeding How should I use this medicine? Take this medicine by mouth with a glass of water. Follow the directions on the prescription label. Take your doses at regular intervals. Do not take your medicine more often than directed. Do not stop taking this medicine suddenly except upon the advice of your doctor. Stopping this medicine too quickly may cause serious side effects or your condition may worsen. A special MedGuide will be given to you by the pharmacist with each prescription and refill. Be sure to read this information carefully each time. Talk to your pediatrician regarding the use of this medicine in children. While this drug may be prescribed for children as young as 12 years for selected conditions, precautions do apply. Overdosage: If you think you have taken too much of this medicine contact a poison control center or emergency room at once. NOTE: This medicine is only for you. Do not share this medicine with others. What if I miss a dose? If you miss a dose, take it as soon as you can. If it is almost time for your next dose, take only that dose. Do not  take double or extra doses. What may interact with this medicine? Do not take this medicine with any of the following medications: -arsenic trioxide -certain medicines used to regulate abnormal heartbeat or to treat other heart conditions -cisapride -halofantrine -levomethadyl -linezolid -MAOIs like Carbex, Eldepryl, Marplan, Nardil, and Parnate -methylene blue -other medicines for mental depression -phenothiazines like perphenazine, thioridazine and chlorpromazine -pimozide -procarbazine -sparfloxacin -St. John's Wort -ziprasidone This medicine may also interact with the following medications: -cimetidine -tolazamide This list may not describe all possible interactions. Give your health care provider a list of all the medicines, herbs, non-prescription drugs, or dietary supplements you use. Also tell them if you smoke, drink alcohol, or use illegal drugs. Some items may interact with your medicine. What should I watch for while using this medicine? Visit your doctor or health care professional for regular checks on your progress. It can take several days before you feel the full effect of this medicine. If you have been taking this medicine regularly for some time, do not suddenly stop taking it. You must gradually reduce the dose or you may get severe side effects. Ask your doctor or health care professional for advice. Even after you stop taking this medicine it can still affect your body for several days. Patients and their families should watch out for new or worsening thoughts of suicide or depression. Also watch out for sudden changes in feelings such as feeling anxious, agitated, panicky, irritable, hostile, aggressive, impulsive, severely restless, overly excited and hyperactive, or not being able to sleep. If this happens, especially at the beginning of treatment or after a change in dose,   call your health care professional. You may get drowsy or dizzy. Do not drive, use machinery,  or do anything that needs mental alertness until you know how this medicine affects you. Do not stand or sit up quickly, especially if you are an older patient. This reduces the risk of dizzy or fainting spells. Alcohol may increase dizziness and drowsiness. Avoid alcoholic drinks. Do not treat yourself for coughs, colds, or allergies without asking your doctor or health care professional for advice. Some ingredients can increase possible side effects. Your mouth may get dry. Chewing sugarless gum or sucking hard candy, and drinking plenty of water may help. Contact your doctor if the problem does not go away or is severe. This medicine may cause dry eyes and blurred vision. If you wear contact lenses you may feel some discomfort. Lubricating drops may help. See your eye doctor if the problem does not go away or is severe. This medicine can make you more sensitive to the sun. Keep out of the sun. If you cannot avoid being in the sun, wear protective clothing and use sunscreen. Do not use sun lamps or tanning beds/booths. What side effects may I notice from receiving this medicine? Side effects that you should report to your doctor or health care professional as soon as possible: -allergic reactions like skin rash, itching or hives, swelling of the face, lips, or tongue -anxious -breathing problems -changes in vision -confusion -elevated mood, decreased need for sleep, racing thoughts, impulsive behavior -eye pain -fast, irregular heartbeat -feeling faint or lightheaded, falls -feeling agitated, angry, or irritable -fever with increased sweating -hallucination, loss of contact with reality -seizures -stiff muscles -suicidal thoughts or other mood changes -tingling, pain, or numbness in the feet or hands -trouble passing urine or change in the amount of urine -trouble sleeping -unusually weak or tired -vomiting -yellowing of the eyes or skin Side effects that usually do not require medical  attention (report to your doctor or health care professional if they continue or are bothersome): -change in sex drive or performance -change in appetite or weight -constipation -dizziness -dry mouth -nausea -tired -tremors -upset stomach This list may not describe all possible side effects. Call your doctor for medical advice about side effects. You may report side effects to FDA at 1-800-FDA-1088. Where should I keep my medicine? Keep out of the reach of children. Store at room temperature between 15 and 30 degrees C (59 and 86 degrees F). Throw away any unused medicine after the expiration date. NOTE: This sheet is a summary. It may not cover all possible information. If you have questions about this medicine, talk to your doctor, pharmacist, or health care provider.  2018 Elsevier/Gold Standard (2015-08-23 12:35:05)  

## 2017-08-19 ENCOUNTER — Encounter: Payer: Self-pay | Admitting: Psychiatry

## 2017-08-19 ENCOUNTER — Other Ambulatory Visit: Payer: Self-pay

## 2017-08-19 ENCOUNTER — Ambulatory Visit: Payer: Self-pay | Admitting: Psychiatry

## 2017-08-19 VITALS — BP 134/82 | HR 91 | Temp 98.5°F | Ht 61.0 in | Wt 216.4 lb

## 2017-08-19 DIAGNOSIS — F250 Schizoaffective disorder, bipolar type: Secondary | ICD-10-CM | POA: Diagnosis not present

## 2017-08-19 DIAGNOSIS — F17200 Nicotine dependence, unspecified, uncomplicated: Secondary | ICD-10-CM | POA: Diagnosis not present

## 2017-08-19 DIAGNOSIS — F4010 Social phobia, unspecified: Secondary | ICD-10-CM

## 2017-08-19 DIAGNOSIS — F4310 Post-traumatic stress disorder, unspecified: Secondary | ICD-10-CM

## 2017-08-19 MED ORDER — HYDROXYZINE HCL 10 MG PO TABS: 10.0000 mg | ORAL_TABLET | Freq: Three times a day (TID) | ORAL | 2 refills | Status: DC | PRN

## 2017-08-19 NOTE — Patient Instructions (Signed)
Hydroxyzine capsules or tablets What is this medicine? HYDROXYZINE (hye [place] i zeen) is an antihistamine. This medicine is used to treat allergy symptoms. It is also used to treat anxiety and tension. This medicine can be used with other medicines to induce sleep before surgery. This medicine may be used for other purposes; ask your health care provider or pharmacist if you have questions. COMMON BRAND NAME(S): ANX, Atarax, Rezine, Vistaril What should I tell my health care provider before I take this medicine? They need to know if you have any of these conditions: -any chronic illness -difficulty passing urine -glaucoma -heart disease -kidney disease -liver disease -lung disease -an unusual or allergic reaction to hydroxyzine, cetirizine, other medicines, foods, dyes, or preservatives -pregnant or trying to get pregnant -breast-feeding How should I use this medicine? Take this medicine by mouth with a full glass of water. Follow the directions on the prescription label. You may take this medicine with food or on an empty stomach. Take your medicine at regular intervals. Do not take your medicine more often than directed. Talk to your pediatrician regarding the use of this medicine in children. Special care may be needed. While this drug may be prescribed for children as young as 75 years of age for selected conditions, precautions do apply. Patients over 62 years old may have a stronger reaction and need a smaller dose. Overdosage: If you think you have taken too much of this medicine contact a poison control center or emergency room at once. NOTE: This medicine is only for you. Do not share this medicine with others. What if I miss a dose? If you miss a dose, take it as soon as you can. If it is almost time for your next dose, take only that dose. Do not take double or extra doses. What may interact with this medicine? -alcohol -barbiturate medicines for sleep or seizures -medicines for  colds, allergies -medicines for depression, anxiety, or emotional disturbances -medicines for pain -medicines for sleep -muscle relaxants This list may not describe all possible interactions. Give your health care provider a list of all the medicines, herbs, non-prescription drugs, or dietary supplements you use. Also tell them if you smoke, drink alcohol, or use illegal drugs. Some items may interact with your medicine. What should I watch for while using this medicine? Tell your doctor or health care professional if your symptoms do not improve. You may get drowsy or dizzy. Do not drive, use machinery, or do anything that needs mental alertness until you know how this medicine affects you. Do not stand or sit up quickly, especially if you are an older patient. This reduces the risk of dizzy or fainting spells. Alcohol may interfere with the effect of this medicine. Avoid alcoholic drinks. Your mouth may get dry. Chewing sugarless gum or sucking hard candy, and drinking plenty of water may help. Contact your doctor if the problem does not go away or is severe. This medicine may cause dry eyes and blurred vision. If you wear contact lenses you may feel some discomfort. Lubricating drops may help. See your eye doctor if the problem does not go away or is severe. If you are receiving skin tests for allergies, tell your doctor you are using this medicine. What side effects may I notice from receiving this medicine? Side effects that you should report to your doctor or health care professional as soon as possible: -fast or irregular heartbeat -difficulty passing urine -seizures -slurred speech or confusion -tremor Side effects that  usually do not require medical attention (report to your doctor or health care professional if they continue or are bothersome): -constipation -drowsiness -fatigue -headache -stomach upset This list may not describe all possible side effects. Call your doctor for  medical advice about side effects. You may report side effects to FDA at 1-800-FDA-1088. Where should I keep my medicine? Keep out of the reach of children. Store at room temperature between 15 and 30 degrees C (59 and 86 degrees F). Keep container tightly closed. Throw away any unused medicine after the expiration date. NOTE: This sheet is a summary. It may not cover all possible information. If you have questions about this medicine, talk to your doctor, pharmacist, or health care provider.  2018 Elsevier/Gold Standard (2007-08-05 14:50:59)  

## 2017-08-19 NOTE — Progress Notes (Signed)
Patricia Good OP Progress Note  08/19/2017 11:48 AM Patricia Good  MRN:  [id number]  Chief Complaint: ' I have some PTSD sx." Chief Complaint    Follow-up; Medication Refill     HPI: Patricia Good is a 57 year old Caucasian female, divorced, on SSD, lives in [place], has a history of schizoaffective disorder, PTSD, anxiety disorder, tobacco use disorder, multiple medical problems including COPD, psoriasis, vitamin B12 deficiency, history of malignant melanoma, hypothyroidism, hyperlipidemia and osteoarthritis, presented to the clinic today for a follow-up visit.    Patient today reports she has noticed some PTSD symptoms recently.  She reports some intrusive memories and racing thoughts.  Patient was referred for psychotherapy visit however she reports she had to reschedule because of her dental appointment.  She has another appointment scheduled in a few weeks.  Discussed with her the importance of therapy.  Also discussed adding a small dose of hydroxyzine as needed for severe anxiety.  She agrees with plan.  She is already on multiple medications including doxepin, Abilify, Wellbutrin and so on.  Patient reports she stopped taking the Seroquel after starting the doxepin for sleep.  She reports sleep is good on the doxepin 10 mg.  Patient denies any suicidality or homicidality.  Patient denies any perceptual disturbances.  Patient continues to smoke cigarettes.  She reports she is not ready to quit.       Visit Diagnosis:    ICD-10-CM   1. Schizoaffective disorder, bipolar type ([place]) F25.0   2. PTSD (post-traumatic stress disorder) F43.10   3. Social anxiety disorder F40.10   4. Tobacco use disorder F17.200     Past Psychiatric History: History of inpatient mental health admission 4-5 times in the past.  I have reviewed past psychiatric history from my progress note on 07/16/2017.  Past trials of trazodone, Prozac.  Past Medical History:  Past Medical History:  Diagnosis Date  . Abnormal  thyroid stimulating hormone (TSH) level   . ADHD (attention deficit hyperactivity disorder)   . Anxiety   . Asthma   . PTSD (post-traumatic stress disorder)   . Schizoaffective disorder [facility])     Past Surgical History:  Procedure Laterality Date  . ABDOMINAL HYSTERECTOMY    . ECTOPIC PREGNANCY SURGERY      Family Psychiatric History: Reviewed family psychiatric history from my progress note on 07/16/2017  Family History:  Family History  Problem Relation Age of Onset  . Hypertension Father   . Diabetes Father   . Heart disease Father   . Blindness Father   . Obesity Brother   . Arthritis Brother   . Alcohol abuse Brother   . Drug abuse Brother   . Depression Brother   . Alcohol abuse Sister   . Drug abuse Sister   . Anxiety disorder Sister   . Depression Sister   . Breast cancer Neg Hx     Social History: She lives in [place].  She reports she was married 6 times, divorced 6 times.  She denies having any children.  She is on disability.  I have reviewed social history from my progress note on 07/16/2017 Social History   Socioeconomic History  . Marital status: Single    Spouse name: Not on file  . Number of children: 0  . Years of education: Not on file  . Highest education level: Associate degree: occupational, Hotel manager, or vocational program  Occupational History  . Not on file  Social Needs  . Financial resource strain: Somewhat hard  .  Food insecurity:    Worry: Sometimes true    Inability: Sometimes true  . Transportation needs:    Medical: No    Non-medical: No  Tobacco Use  . Smoking status: Current Some Day Smoker    Packs/day: 0.50    Types: Cigarettes    Start date: 01/22/1969  . Smokeless tobacco: Never Used  Substance and Sexual Activity  . Alcohol use: Yes    Alcohol/week: 0.6 - 1.2 oz    Types: 1 Glasses of wine per week  . Drug use: No  . Sexual activity: Not Currently  Lifestyle  . Physical activity:    Days per week: 0 days     Minutes per session: 0 min  . Stress: Very much  Relationships  . Social connections:    Talks on phone: Never    Gets together: Never    Attends religious service: Never    Active member of club or organization: No    Attends meetings of clubs or organizations: Never    Relationship status: Divorced  Other Topics Concern  . Not on file  Social History Narrative  . Not on file    Allergies:  Allergies  Allergen Reactions  . Shellfish Allergy Anaphylaxis    Only to crab meat (non-imitation), not shrimp  . Sulfa Antibiotics Hives, Anaphylaxis and Other (See Comments)  . Sulfasalazine Anaphylaxis and Hives    Other reaction(s): Other (See Comments)  . Lisinopril     Metabolic Disorder Labs: Lab Results  Component Value Date   HGBA1C 5.6 07/12/2017   Lab Results  Component Value Date   PROLACTIN 1.2 (L) 07/12/2017   Lab Results  Component Value Date   CHOL 246 (H) 07/12/2017   TRIG 95 07/12/2017   HDL 53 07/12/2017   LDLCALC 174 (H) 07/12/2017   Lab Results  Component Value Date   TSH 0.465 07/12/2017   TSH 0.219 (L) 11/03/2011    Therapeutic Level Labs: No results found for: LITHIUM No results found for: VALPROATE No components found for:  CBMZ  Current Medications: Current Outpatient Medications  Medication Sig Dispense Refill  . albuterol (PROAIR HFA) 108 (90 BASE) MCG/ACT inhaler Inhale 1-2 puffs into the lungs every 4 (four) hours as needed.     Marland Kitchen amLODipine (NORVASC) 10 MG tablet Take 10 mg by mouth daily.    . ARIPiprazole (ABILIFY) 20 MG tablet Take 1 tablet (20 mg total) by mouth daily. 90 tablet 0  . B Complex-C (B-COMPLEX WITH VITAMIN C) tablet Take 1 tablet by mouth daily.    . beclomethasone (QVAR) 80 MCG/ACT inhaler Inhale 2 puffs into the lungs 2 (two) times daily.    Marland Kitchen buPROPion (WELLBUTRIN XL) 150 MG 24 hr tablet Take 1 tablet (150 mg total) by mouth daily. 90 tablet 0  . calcium-vitamin D (OSCAL WITH D) 500-200 MG-UNIT tablet Take 1 tablet  by mouth daily with breakfast.    . clobetasol (TEMOVATE) 0.05 % external solution Twice daily as needed for rash and scaling in scalp    . doxepin (SINEQUAN) 10 MG capsule Take 1-2 capsules (10-20 mg total) by mouth at bedtime. 60 capsule 1  . ferrous sulfate 325 (65 FE) MG tablet Take 325 mg by mouth.    . fluticasone (FLOVENT HFA) 220 MCG/ACT inhaler Inhale 2 puffs 2 (two) times daily into the lungs.    . furosemide (LASIX) 20 MG tablet Take 20 mg by mouth daily.     Marland Kitchen levothyroxine (SYNTHROID, LEVOTHROID) 125 MCG  tablet Take 125 mcg daily before breakfast by mouth.     Marland Kitchen lisinopril (PRINIVIL,ZESTRIL) 40 MG tablet Take 40 mg by mouth.    . lubriskin (NUTRADERM) LOTN Apply topically.    . meloxicam (MOBIC) 15 MG tablet Take 15 mg by mouth daily.    Marland Kitchen omeprazole (PRILOSEC) 40 MG capsule Take 40 mg by mouth daily.     Marland Kitchen tiotropium (SPIRIVA) 18 MCG inhalation capsule Place 18 mcg into inhaler and inhale daily.     . valACYclovir (VALTREX) 500 MG tablet Take 500 mg by mouth daily.     . hydrOXYzine (ATARAX/VISTARIL) 10 MG tablet Take 1 tablet (10 mg total) by mouth 3 (three) times daily as needed for anxiety. 90 tablet 2   No current facility-administered medications for this visit.      Musculoskeletal: Strength & Muscle Tone: within normal limits Gait & Station: normal Patient leans: N/A  Psychiatric Specialty Exam: Review of Systems  Psychiatric/Behavioral: The patient is nervous/anxious.   All other systems reviewed and are negative.   Blood pressure 134/82, pulse 91, temperature 98.5 F (36.9 C), temperature source Oral, height 5\' 1"  (1.549 m), weight 216 lb 6.4 oz (98.2 kg).Body mass index is 40.89 kg/m.  General Appearance: Casual  Eye Contact:  Fair  Speech:  Normal Rate  Volume:  Normal  Mood:  Anxious  Affect:  Appropriate  Thought Process:  Goal Directed and Descriptions of Associations: Intact  Orientation:  Full (Time, Place, and Person)  Thought Content: Logical    Suicidal Thoughts:  No  Homicidal Thoughts:  No  Memory:  Immediate;   Fair Recent;   Fair Remote;   Fair  Judgement:  Fair  Insight:  Fair  Psychomotor Activity:  Normal  Concentration:  Concentration: Fair and Attention Span: Fair  Recall:  [employer] of Knowledge: Fair  Language: Fair  Akathisia:  No  Handed:  Right  AIMS (if indicated): 0  Assets:  Communication Skills Desire for Improvement Social Support  ADL's:  Intact  Cognition: WNL  Sleep:  Fair   Screenings:   Assessment and Plan:Draven is a 57 year old Caucasian female, divorced, on SSD, lives in [place], has a history of schizoaffective disorder, depression, anxiety disorder, PTSD, multiple medical problems including COPD, hyperlipidemia, vitamin B12 deficiency, psoriasis, hypothyroidism, osteoarthritis, presented to the clinic today for a follow-up visit.  Patient used to follow up with [place] in the past.  Patient reports some recent PTSD symptoms like intrusive memories.  She has been referred for psychotherapy.  Plan as noted below.  Plan Schizoaffective disorder Continue Abilify 20 mg p.o. daily Discontinue Seroquel for lack of efficacy. Continue doxepin 10 mg p.o. nightly for sleep. Wellbutrin XL 150 mg p.o. Daily.  PTSD Patient has been referred to Ms. Royal Piedra for CBT. Start hydroxyzine 10 mg p.o. 3 times daily as needed for severe anxiety symptoms  For social anxiety disorder CBT  For insomnia Continue doxepin 10-20 mg p.o. Nightly.  Tobacco use disorder Provided smoking cessation counseling.  Patient is not ready to quit.  Follow-up in clinic in 1 month or sooner if needed.  More than 50 % of the time was spent for psychoeducation and supportive psychotherapy and care coordination.  This note was generated in part or whole with voice recognition software. Voice recognition is usually quite accurate but there are transcription errors that can and very often do  occur. I apologize for any typographical errors that were not detected and corrected.  Ursula Alert, Good 08/19/2017, 11:48 AM

## 2017-08-24 ENCOUNTER — Ambulatory Visit: Payer: Medicare Other | Admitting: Licensed Clinical Social Worker

## 2017-09-15 ENCOUNTER — Ambulatory Visit: Payer: Medicare Other | Admitting: Psychiatry

## 2017-09-28 ENCOUNTER — Ambulatory Visit: Payer: Self-pay | Admitting: Psychiatry

## 2017-09-28 ENCOUNTER — Ambulatory Visit: Payer: Medicare Other | Admitting: Licensed Clinical Social Worker

## 2017-10-04 ENCOUNTER — Ambulatory Visit: Payer: Medicare Other | Admitting: Psychiatry

## 2017-11-04 ENCOUNTER — Ambulatory Visit: Payer: Medicare Other | Admitting: Licensed Clinical Social Worker

## 2017-12-02 ENCOUNTER — Ambulatory Visit: Payer: Medicare Other | Admitting: Psychiatry

## 2017-12-02 ENCOUNTER — Ambulatory Visit: Payer: Medicare Other | Admitting: Licensed Clinical Social Worker

## 2017-12-07 ENCOUNTER — Other Ambulatory Visit: Payer: Self-pay | Admitting: Psychiatry

## 2017-12-07 DIAGNOSIS — F4010 Social phobia, unspecified: Secondary | ICD-10-CM

## 2017-12-07 DIAGNOSIS — F250 Schizoaffective disorder, bipolar type: Secondary | ICD-10-CM

## 2017-12-07 DIAGNOSIS — F4310 Post-traumatic stress disorder, unspecified: Secondary | ICD-10-CM

## 2017-12-08 ENCOUNTER — Ambulatory Visit: Payer: Medicare Other | Admitting: Psychiatry

## 2017-12-08 ENCOUNTER — Other Ambulatory Visit: Payer: Self-pay

## 2017-12-08 ENCOUNTER — Encounter: Payer: Self-pay | Admitting: Psychiatry

## 2017-12-08 VITALS — BP 125/81 | HR 94 | Temp 99.7°F | Wt 197.0 lb

## 2017-12-08 DIAGNOSIS — F4010 Social phobia, unspecified: Secondary | ICD-10-CM

## 2017-12-08 DIAGNOSIS — F250 Schizoaffective disorder, bipolar type: Secondary | ICD-10-CM

## 2017-12-08 DIAGNOSIS — F4310 Post-traumatic stress disorder, unspecified: Secondary | ICD-10-CM

## 2017-12-08 DIAGNOSIS — F5105 Insomnia due to other mental disorder: Secondary | ICD-10-CM

## 2017-12-08 DIAGNOSIS — F17200 Nicotine dependence, unspecified, uncomplicated: Secondary | ICD-10-CM | POA: Diagnosis not present

## 2017-12-08 MED ORDER — ARIPIPRAZOLE 20 MG PO TABS: 20.0000 mg | ORAL_TABLET | Freq: Every day | ORAL | 0 refills | Status: DC

## 2017-12-08 MED ORDER — HYDROXYZINE HCL 10 MG PO TABS: 10.0000 mg | ORAL_TABLET | Freq: Three times a day (TID) | ORAL | 0 refills | Status: DC | PRN

## 2017-12-08 MED ORDER — DOXEPIN HCL 10 MG PO CAPS: 10.0000 mg | ORAL_CAPSULE | Freq: Every day | ORAL | 0 refills | Status: DC

## 2017-12-08 MED ORDER — BUPROPION HCL ER (XL) 150 MG PO TB24: 150.0000 mg | ORAL_TABLET | Freq: Every day | ORAL | 0 refills | Status: DC

## 2017-12-08 NOTE — Progress Notes (Signed)
Cherry Log MD OP Progress Note  12/08/2017 9:02 AM Patricia Good  MRN:  [id number]  Chief Complaint: ' I am here for follow up." Chief Complaint    Follow-up; Medication Refill     HPI: Makell is a 57 year old Caucasian female, divorced, on SSD, lives in [place], has a history of schizoaffective disorder, PTSD, anxiety disorder, tobacco use disorder, multiple medical problems including COPD, psoriasis, vitamin B12 deficiency, history of malignant melanoma, hypothyroidism, hyperlipidemia, osteoarthritis, presented to the clinic today for a follow-up visit.  Patient today reports she stopped taking all her medications a month ago.  Patient reports she was feeling good with regards to her mood and decided to stay away from medications and hence stopped it.  Patient reports she is currently not doing well and wants to be back on her medications.  She reports sadness, lack of motivation, low energy and so on.  Patient also reports sleep problems.  Patient reports auditory hallucinations however does not elaborate on them.  Patient reports they are very minimal.  Patient denies any paranoia.  Patient denies any visual hallucinations.  Patient denies any suicidality or homicidality.  Patient continues to be anxious and does have social anxiety disorder.  Patient did not follow through with recommendations to start psychotherapy as discussed during her previous visits.  Patient reports she has had psychotherapy for 30 years of her life and does not want to go back again.  Discussed with patient about the risk of noncompliance with medications as well as recommendations.  Discussed with her that she can be terminated from the clinic if she does not follow up as instructed as well as make medication changes without guidance from her provider.  Patient voiced understanding. Visit Diagnosis:    ICD-10-CM   1. Schizoaffective disorder, bipolar type ([place]) F25.0 ARIPiprazole (ABILIFY) 20 MG tablet    buPROPion  (WELLBUTRIN XL) 150 MG 24 hr tablet    doxepin (SINEQUAN) 10 MG capsule    hydrOXYzine (ATARAX/VISTARIL) 10 MG tablet  2. PTSD (post-traumatic stress disorder) F43.10 buPROPion (WELLBUTRIN XL) 150 MG 24 hr tablet    hydrOXYzine (ATARAX/VISTARIL) 10 MG tablet  3. Social anxiety disorder F40.10   4. Tobacco use disorder F17.200   5. Insomnia due to mental condition F51.05     Past Psychiatric History: I have reviewed past psychiatric history from my progress note on 07/16/2017.  Past trials of trazodone, Prozac.  Past Medical History:  Past Medical History:  Diagnosis Date  . Abnormal thyroid stimulating hormone (TSH) level   . ADHD (attention deficit hyperactivity disorder)   . Anxiety   . Asthma   . PTSD (post-traumatic stress disorder)   . Schizoaffective disorder [facility])     Past Surgical History:  Procedure Laterality Date  . ABDOMINAL HYSTERECTOMY    . ECTOPIC PREGNANCY SURGERY      Family Psychiatric History: Have reviewed family psychiatric history from my progress note on 07/16/2017.  Family History:  Family History  Problem Relation Age of Onset  . Hypertension Father   . Diabetes Father   . Heart disease Father   . Blindness Father   . Obesity Brother   . Arthritis Brother   . Alcohol abuse Brother   . Drug abuse Brother   . Depression Brother   . Alcohol abuse Sister   . Drug abuse Sister   . Anxiety disorder Sister   . Depression Sister   . Breast cancer Neg Hx     Social History: I have reviewed  social history from my progress note on 07/16/2017. Social History   Socioeconomic History  . Marital status: Single    Spouse name: Not on file  . Number of children: 0  . Years of education: Not on file  . Highest education level: Associate degree: occupational, Hotel manager, or vocational program  Occupational History  . Not on file  Social Needs  . Financial resource strain: Somewhat hard  . Food insecurity:    Worry: Sometimes true    Inability:  Sometimes true  . Transportation needs:    Medical: No    Non-medical: No  Tobacco Use  . Smoking status: Current Some Day Smoker    Packs/day: 0.50    Types: Cigarettes    Start date: 01/22/1969  . Smokeless tobacco: Never Used  Substance and Sexual Activity  . Alcohol use: Yes    Alcohol/week: 1.0 - 2.0 standard drinks    Types: 1 Glasses of wine per week  . Drug use: No  . Sexual activity: Not Currently  Lifestyle  . Physical activity:    Days per week: 0 days    Minutes per session: 0 min  . Stress: Very much  Relationships  . Social connections:    Talks on phone: Never    Gets together: Never    Attends religious service: Never    Active member of club or organization: No    Attends meetings of clubs or organizations: Never    Relationship status: Divorced  Other Topics Concern  . Not on file  Social History Narrative  . Not on file    Allergies:  Allergies  Allergen Reactions  . Shellfish Allergy Anaphylaxis    Only to crab meat (non-imitation), not shrimp  . Sulfa Antibiotics Hives, Anaphylaxis and Other (See Comments)  . Sulfasalazine Anaphylaxis and Hives    Other reaction(s): Other (See Comments)  . Lisinopril     Metabolic Disorder Labs: Lab Results  Component Value Date   HGBA1C 5.6 07/12/2017   Lab Results  Component Value Date   PROLACTIN 1.2 (L) 07/12/2017   Lab Results  Component Value Date   CHOL 246 (H) 07/12/2017   TRIG 95 07/12/2017   HDL 53 07/12/2017   LDLCALC 174 (H) 07/12/2017   Lab Results  Component Value Date   TSH 0.465 07/12/2017   TSH 0.219 (L) 11/03/2011    Therapeutic Level Labs: No results found for: LITHIUM No results found for: VALPROATE No components found for:  CBMZ  Current Medications: Current Outpatient Medications  Medication Sig Dispense Refill  . albuterol (PROAIR HFA) 108 (90 BASE) MCG/ACT inhaler Inhale 1-2 puffs into the lungs every 4 (four) hours as needed.     Marland Kitchen amLODipine (NORVASC) 10 MG  tablet Take 10 mg by mouth daily.    . ARIPiprazole (ABILIFY) 20 MG tablet Take 1 tablet (20 mg total) by mouth daily. 90 tablet 0  . B Complex-C (B-COMPLEX WITH VITAMIN C) tablet Take 1 tablet by mouth daily.    . beclomethasone (QVAR) 80 MCG/ACT inhaler Inhale 2 puffs into the lungs 2 (two) times daily.    Marland Kitchen buPROPion (WELLBUTRIN XL) 150 MG 24 hr tablet Take 1 tablet (150 mg total) by mouth daily. 90 tablet 0  . calcium-vitamin D (OSCAL WITH D) 500-200 MG-UNIT tablet Take 1 tablet by mouth daily with breakfast.    . clobetasol (TEMOVATE) 0.05 % external solution Twice daily as needed for rash and scaling in scalp    . doxepin (SINEQUAN) 10  MG capsule Take 1-2 capsules (10-20 mg total) by mouth at bedtime. For sleep 180 capsule 0  . ferrous sulfate 325 (65 FE) MG tablet Take 325 mg by mouth.    . fluticasone (FLOVENT HFA) 220 MCG/ACT inhaler Inhale 2 puffs 2 (two) times daily into the lungs.    . furosemide (LASIX) 20 MG tablet Take 20 mg by mouth daily.     . hydrOXYzine (ATARAX/VISTARIL) 10 MG tablet Take 1 tablet (10 mg total) by mouth 3 (three) times daily as needed for anxiety. 270 tablet 0  . levothyroxine (SYNTHROID, LEVOTHROID) 125 MCG tablet Take 125 mcg daily before breakfast by mouth.     Marland Kitchen lisinopril (PRINIVIL,ZESTRIL) 40 MG tablet Take 40 mg by mouth.    . lubriskin (NUTRADERM) LOTN Apply topically.    . meloxicam (MOBIC) 15 MG tablet Take 15 mg by mouth daily.    Marland Kitchen omeprazole (PRILOSEC) 40 MG capsule Take 40 mg by mouth daily.     Marland Kitchen tiotropium (SPIRIVA) 18 MCG inhalation capsule Place 18 mcg into inhaler and inhale daily.     . valACYclovir (VALTREX) 500 MG tablet Take 500 mg by mouth daily.      No current facility-administered medications for this visit.      Musculoskeletal: Strength & Muscle Tone: within normal limits Gait & Station: normal Patient leans: N/A  Psychiatric Specialty Exam: Review of Systems  Psychiatric/Behavioral: Positive for depression and  hallucinations. Negative for substance abuse and suicidal ideas. The patient is nervous/anxious and has insomnia.   All other systems reviewed and are negative.   Blood pressure 125/81, pulse 94, temperature 99.7 F (37.6 C), temperature source Oral, weight 197 lb (89.4 kg).Body mass index is 37.22 kg/m.  General Appearance: Casual  Eye Contact:  Fair  Speech:  Normal Rate  Volume:  Normal  Mood:  Anxious, Depressed and Dysphoric  Affect:  Constricted  Thought Process:  Goal Directed and Descriptions of Associations: Intact  Orientation:  Full (Time, Place, and Person)  Thought Content: Logical   Suicidal Thoughts:  No  Homicidal Thoughts:  No  Memory:  Immediate;   Fair Recent;   Fair Remote;   Fair  Judgement:  Fair  Insight:  Fair  Psychomotor Activity:  Normal  Concentration:  Concentration: Fair and Attention Span: Fair  Recall:  [employer] of Knowledge: Fair  Language: Fair  Akathisia:  No  Handed:  Right  AIMS (if indicated): denies tremors, rigidity, stiffness  Assets:  Communication Skills Desire for Improvement Social Support  ADL's:  Intact  Cognition: WNL  Sleep:  Poor   Screenings:   Assessment and Plan: Patricia Good is a 57 year old Caucasian female, divorced, on SSD, lives in [place], has a history of schizoaffective disorder, depression, anxiety disorder, PTSD, multiple medical problems including COPD, hyperlipidemia, vitamin B12 deficiency, psoriasis, hypothyroidism, osteoarthritis, presented to the clinic today for a follow-up visit.  Patient used to follow up with [facility] in the past.  Patient has been unable to keep up with follow-up visits at this clinic as recommended.  Patient was last seen in May 2019 and at that time was advised to return in a month.  Patient however returns today stating she does not remember why she did not come for the appointments and also reports she stopped taking all her medications a month ago since she was  feeling better.  Patient denies any suicidality today but does have some auditory hallucinations which she states she is able to cope with.  Patient reports she would like to be back on all her medications.  She also declined psychotherapy.  Plan as noted below.  Plan Schizoaffective disorder Restart Abilify 20 mg p.o. daily Restart doxepin 10 mg p.o. nightly Restart Wellbutrin XL 150 mg p.o. Daily.  PTSD Patient has been referred for psychotherapy however she declined. Restart hydroxyzine 10 mg p.o. 3 times daily as needed for severe anxiety symptoms  For social anxiety disorder Patient declines psychotherapy.  Tobacco use disorder Patient is not ready to quit.  Discussed with patient that if she is not following recommendations as well as is making medication readjustments on her own she will be dismissed from the clinic.  Patient voiced understanding.  Follow up in clinic in 1 month or sooner if needed.  More than 50 % of the time was spent for psychoeducation and supportive psychotherapy and care coordination.  This note was generated in part or whole with voice recognition software. Voice recognition is usually quite accurate but there are transcription errors that can and very often do occur. I apologize for any typographical errors that were not detected and corrected.         Ursula Alert, MD 12/08/2017, 9:02 AM

## 2017-12-13 ENCOUNTER — Other Ambulatory Visit: Payer: Self-pay

## 2017-12-13 ENCOUNTER — Encounter: Payer: Self-pay | Admitting: Emergency Medicine

## 2017-12-13 DIAGNOSIS — E079 Disorder of thyroid, unspecified: Secondary | ICD-10-CM | POA: Insufficient documentation

## 2017-12-13 DIAGNOSIS — Z79899 Other long term (current) drug therapy: Secondary | ICD-10-CM | POA: Insufficient documentation

## 2017-12-13 DIAGNOSIS — J45909 Unspecified asthma, uncomplicated: Secondary | ICD-10-CM | POA: Diagnosis not present

## 2017-12-13 DIAGNOSIS — H5713 Ocular pain, bilateral: Secondary | ICD-10-CM | POA: Diagnosis not present

## 2017-12-13 DIAGNOSIS — F17210 Nicotine dependence, cigarettes, uncomplicated: Secondary | ICD-10-CM | POA: Insufficient documentation

## 2017-12-13 NOTE — ED Triage Notes (Signed)
Patient states she had been having eye pain for a couple weeks and was seen by opthamologist a few weeks ago and "pulled something out of them" according to patient. Patient states the pain has gotten worse tonight.

## 2017-12-14 ENCOUNTER — Emergency Department
Admission: EM | Admit: 2017-12-14 | Discharge: 2017-12-14 | Disposition: A | Discharge status: Home / Self Care | Payer: Medicare Other | Attending: Emergency Medicine | Admitting: Emergency Medicine

## 2017-12-14 DIAGNOSIS — H5713 Ocular pain, bilateral: Secondary | ICD-10-CM | POA: Diagnosis not present

## 2017-12-14 MED ORDER — TETRACAINE HCL 0.5 % OP SOLN
1.0000 [drp] | Freq: Once | OPHTHALMIC | Status: DC
  Filled 2017-12-14: qty 4

## 2017-12-14 MED ORDER — FLUORESCEIN SODIUM 1 MG OP STRP
ORAL_STRIP | OPHTHALMIC | Status: AC
  Filled 2017-12-14: qty 1

## 2017-12-14 MED ORDER — ERYTHROMYCIN 5 MG/GM OP OINT
TOPICAL_OINTMENT | Freq: Once | OPHTHALMIC | Status: AC
  Administered 2017-12-14: 1 via OPHTHALMIC
  Filled 2017-12-14: qty 1

## 2017-12-14 NOTE — Discharge Instructions (Addendum)
Your workup in the Emergency Department today was reassuring.  We did not find any specific abnormalities, but we strongly encourage you to use the provided antibiotic ointment tonight before bed and go to the [facility] first thing in the morning (they open at 8am) . Dr. Wallace Going said that Dr. George Ina or one of their colleagues will be able to see you.  We recommend you drink plenty of fluids, take your regular medications and/or any new ones prescribed today, and follow up with the doctor(s) listed in these documents as recommended.  Return to the Emergency Department if you develop new or worsening symptoms that concern you.

## 2017-12-14 NOTE — ED Notes (Signed)
Pt in with co bilat eye pain that started over a month ago. Went to [facility] for the same and states "they pulled filaments out of my eyes". Pt states pain had improved but now has worsened. States she is light sensitive, no pain at this time.

## 2017-12-14 NOTE — ED Provider Notes (Signed)
[facility] Emergency Department Provider Note  ____________________________________________   First MD Initiated Contact with Patient 12/14/17 0126     (approximate)  I have reviewed the triage vital signs and the nursing notes.   HISTORY  Chief Complaint Eye Pain  The patient is a vague historian and can provide few details of her history.  HPI Patricia Good is a 57 y.o. female with medical and psychiatric history as listed below who presents for evaluation of gradually worsening bilateral eye pain, worse on the left side, that has been gradually worsening over the last month and a half.  She states that she goes to see Dr. George Ina at the [facility].  She last saw him a couple of weeks ago.  She says that he treated her eyes and she was on drops for a month or more and he "removed filaments from my eyes" but the pain has not gone away.  She says it felt worse tonight so she thought she could come into the emergency department.  She describes the pain is severe and nothing particular makes it better.  Bright lights occasionally make it worse.  She denies nausea, vomiting, headache, neck pain, neck stiffness.  She feels like her visual acuity is less than usual.  She is having no numbness or weakness in her extremities, no difficulties with balance, and denies chest pain, shortness of breath, nausea, vomiting, and abdominal pain.  She reports that she did not call Dr. George Ina earlier today.  Past Medical History:  Diagnosis Date  . Abnormal thyroid stimulating hormone (TSH) level   . ADHD (attention deficit hyperactivity disorder)   . Anxiety   . Asthma   . PTSD (post-traumatic stress disorder)   . Schizoaffective disorder [facility])     Patient Active Problem List   Diagnosis Date Noted  . Bipolar affective disorder, depressed, severe, with psychotic behavior ([place]) 02/12/2017  . PTSD (post-traumatic stress disorder) 09/23/2015  . Arthritis 01/23/2015   . H/O Malignant melanoma 06/15/2014  . Bipolar I disorder ([place]) 09/06/2013  . BP (high blood pressure) 09/06/2013  . Peripheral vascular disease ([place]) 09/06/2013  . Current tobacco use 09/06/2013    Past Surgical History:  Procedure Laterality Date  . ABDOMINAL HYSTERECTOMY    . ECTOPIC PREGNANCY SURGERY      Prior to Admission medications   Medication Sig Start Date End Date Taking? Authorizing Provider  albuterol (PROAIR HFA) 108 (90 BASE) MCG/ACT inhaler Inhale 1-2 puffs into the lungs every 4 (four) hours as needed.     [provider]  amLODipine (NORVASC) 10 MG tablet Take 10 mg by mouth daily.    [provider]  ARIPiprazole (ABILIFY) 20 MG tablet Take 1 tablet (20 mg total) by mouth daily. 12/08/17   Ursula Alert, MD  B Complex-C (B-COMPLEX WITH VITAMIN C) tablet Take 1 tablet by mouth daily.    [provider]  beclomethasone (QVAR) 80 MCG/ACT inhaler Inhale 2 puffs into the lungs 2 (two) times daily.    [provider]  buPROPion (WELLBUTRIN XL) 150 MG 24 hr tablet Take 1 tablet (150 mg total) by mouth daily. 12/08/17   Ursula Alert, MD  calcium-vitamin D (OSCAL WITH D) 500-200 MG-UNIT tablet Take 1 tablet by mouth daily with breakfast.    [provider]  clobetasol (TEMOVATE) 0.05 % external solution Twice daily as needed for rash and scaling in scalp 07/13/14   [provider]  doxepin (SINEQUAN) 10 MG capsule  Take 1-2 capsules (10-20 mg total) by mouth at bedtime. For sleep 12/08/17   Ursula Alert, MD  ferrous sulfate 325 (65 FE) MG tablet Take 325 mg by mouth.    [provider]  fluticasone (FLOVENT HFA) 220 MCG/ACT inhaler Inhale 2 puffs 2 (two) times daily into the lungs.    [provider]  furosemide (LASIX) 20 MG tablet Take 20 mg by mouth daily.     [provider]  hydrOXYzine (ATARAX/VISTARIL) 10 MG tablet Take 1 tablet (10 mg total) by mouth 3 (three) times daily as needed for  anxiety. 12/08/17   Ursula Alert, MD  levothyroxine (SYNTHROID, LEVOTHROID) 125 MCG tablet Take 125 mcg daily before breakfast by mouth.     [provider]  lisinopril (PRINIVIL,ZESTRIL) 40 MG tablet Take 40 mg by mouth.    [provider]  lubriskin Tilda Burrow) LOTN Apply topically.    [provider]  meloxicam (MOBIC) 15 MG tablet Take 15 mg by mouth daily.    [provider]  omeprazole (PRILOSEC) 40 MG capsule Take 40 mg by mouth daily.     [provider]  tiotropium (SPIRIVA) 18 MCG inhalation capsule Place 18 mcg into inhaler and inhale daily.     [provider]  valACYclovir (VALTREX) 500 MG tablet Take 500 mg by mouth daily.     [provider]    Allergies Shellfish allergy; Sulfa antibiotics; Sulfasalazine; and Lisinopril  Family History  Problem Relation Age of Onset  . Hypertension Father   . Diabetes Father   . Heart disease Father   . Blindness Father   . Obesity Brother   . Arthritis Brother   . Alcohol abuse Brother   . Drug abuse Brother   . Depression Brother   . Alcohol abuse Sister   . Drug abuse Sister   . Anxiety disorder Sister   . Depression Sister   . Breast cancer Neg Hx     Social History Social History   Tobacco Use  . Smoking status: Current Some Day Smoker    Packs/day: 0.50    Types: Cigarettes    Start date: 01/22/1969  . Smokeless tobacco: Never Used  Substance Use Topics  . Alcohol use: Yes    Alcohol/week: 1.0 - 2.0 standard drinks    Types: 1 Glasses of wine per week  . Drug use: No    Review of Systems Constitutional: No fever/chills Eyes: Eye pain and subjective decreased visual acuity as described above, gradually worsening over a month and a half in spite of treatment by the ophthalmologist ENT: No sore throat. Cardiovascular: Denies chest pain. Respiratory: Denies shortness of breath. Gastrointestinal: No abdominal pain.  No nausea, no vomiting.  No  diarrhea.  No constipation. Genitourinary: Negative for dysuria. Musculoskeletal: Negative for neck pain.  Negative for back pain. Integumentary: Negative for rash. Neurological: Negative for headaches, focal weakness or numbness.   ____________________________________________   PHYSICAL EXAM:  VITAL SIGNS: ED Triage Vitals  Enc Vitals Group     BP 12/13/17 2212 (!) 147/83     Pulse Rate 12/13/17 2212 93     Resp 12/13/17 2212 19     Temp 12/13/17 2212 98.9 F (37.2 C)     Temp Source 12/13/17 2212 Oral     SpO2 12/13/17 2212 97 %     Weight 12/13/17 2213 89.4 kg (197 lb)     Height 12/13/17 2213 1.549 m (5\' 1" )     Head Circumference --  Peak Flow --      Pain Score 12/13/17 2213 3     Pain Loc --      Pain Edu? --      Excl. in [place]? --     Constitutional: Alert and oriented but with odd, flat affect. Eyes: Bilateral conjunctival injection.  No chemosis, no periorbital edema.  After tetracaine and fluroscein, I examined with Woods lamp.  The patient has no abnormalities in the right eye.  The left eye has no corneal abrasions, dendritic patterns, nor ulcers, but she does have several vertical lines above the cornea which may suggest she could have had a foreign body under the upper lid that scratch the eye above the cornea.  I checked pressures with Tono-Pen and the left eye was 14, right eye was 12. Head: Atraumatic. Nose: No congestion/rhinnorhea. Cardiovascular: Normal rate, regular rhythm. Good peripheral circulation.  Respiratory: Normal respiratory effort.  No retractions.  Musculoskeletal: No lower extremity tenderness nor edema. No gross deformities of extremities. Neurologic:  Normal speech and language. No gross focal neurologic deficits are appreciated.  Skin:  Skin is warm, dry and intact. No rash noted. Psychiatric: Mood and affect are flat and blunted.  ____________________________________________   LABS (all labs ordered are listed, but only abnormal  results are displayed)  Labs Reviewed - No data to display ____________________________________________  EKG  None - EKG not ordered by ED physician ____________________________________________  RADIOLOGY   ED MD interpretation:  No indication for imaging  Official radiology report(s): No results found.  ____________________________________________   PROCEDURES  Critical Care performed: No   Procedure(s) performed:   Procedures   ____________________________________________   INITIAL IMPRESSION / ASSESSMENT AND PLAN / ED COURSE  As part of my medical decision making, I reviewed the following data within the [place] notes reviewed and incorporated and Old chart reviewed    Differential diagnosis includes, but is not limited to, ocular foreign body, corneal abrasion, viral infection (herpetic lesion), acute on chronic glaucoma, uveitis/iritis.  The patient is only minimally communicative and is not able to provide much in the way of history.  She does not know what treatment she is received previously, and cannot explain further what she means by reporting that Dr. George Ina "removed filaments from my eye".  Unfortunately [facility] does not use epic and I cannot see anything in her past medical record that indicates an ophthalmological issue.  Initially the patient refused all examination and says she would just go to see Dr. George Ina in the morning.  I encouraged her to wait so that I can get tetracaine, Woods light, and floor seen for further evaluation.  She says that she is willing to do so.  Her vital signs are stable and within normal limits and she does not appear to be in distress although she does appear somewhat uncomfortable and is wearing sunglasses.  I let her leave on her sunglasses until I get the equipment for a more thorough exam.  Clinical Course as of Dec 15 543  Tue Dec 14, 2017  0239 I spoke by phone with Dr.  Wallace Going and explained the clinical scenario and physical exam findings.  He agreed with my plan for erythromycin ophthalmic ointment and close follow-up in a few hours when the ophthalmology clinic opens.  I updated the patient and we gave her the ointment to take home because she drove herself tonight.  I gave my usual and customary return precautions.   [CF]  Clinical Course User Index [CF] Hinda Kehr, MD    ____________________________________________  FINAL CLINICAL IMPRESSION(S) / ED DIAGNOSES  Final diagnoses:  Eye pain, bilateral     MEDICATIONS GIVEN DURING THIS VISIT:  Medications  tetracaine (PONTOCAINE) 0.5 % ophthalmic solution 1 drop (has no administration in time range)  fluorescein 1 MG ophthalmic strip (has no administration in time range)  erythromycin ophthalmic ointment (1 application Both Eyes Given 12/14/17 0247)     ED Discharge Orders    None       Note:  This document was prepared using Dragon voice recognition software and may include unintentional dictation errors.    Hinda Kehr, MD 12/14/17 [phone]

## 2017-12-14 NOTE — ED Notes (Signed)
Patient discharged to home per MD order. Patient in stable condition, and deemed medically cleared by ED provider for discharge. Discharge instructions reviewed with patient/family using "Teach Back"; verbalized understanding of medication education and administration, and information about follow-up care. Denies further concerns. ° °

## 2017-12-21 ENCOUNTER — Emergency Department (EMERGENCY_DEPARTMENT_HOSPITAL)
Admission: EM | Admit: 2017-12-21 | Discharge: 2017-12-22 | Disposition: A | Discharge status: Other Acute Inpatient Hospital | Payer: Medicare Other | Source: Home / Self Care | Attending: Emergency Medicine | Admitting: Emergency Medicine

## 2017-12-21 ENCOUNTER — Other Ambulatory Visit: Payer: Self-pay

## 2017-12-21 DIAGNOSIS — F315 Bipolar disorder, current episode depressed, severe, with psychotic features: Secondary | ICD-10-CM | POA: Diagnosis not present

## 2017-12-21 DIAGNOSIS — F319 Bipolar disorder, unspecified: Secondary | ICD-10-CM | POA: Insufficient documentation

## 2017-12-21 DIAGNOSIS — F4310 Post-traumatic stress disorder, unspecified: Secondary | ICD-10-CM | POA: Diagnosis present

## 2017-12-21 DIAGNOSIS — F4312 Post-traumatic stress disorder, chronic: Secondary | ICD-10-CM | POA: Diagnosis present

## 2017-12-21 DIAGNOSIS — N390 Urinary tract infection, site not specified: Secondary | ICD-10-CM

## 2017-12-21 DIAGNOSIS — Z79899 Other long term (current) drug therapy: Secondary | ICD-10-CM | POA: Insufficient documentation

## 2017-12-21 DIAGNOSIS — Z23 Encounter for immunization: Secondary | ICD-10-CM | POA: Insufficient documentation

## 2017-12-21 DIAGNOSIS — F250 Schizoaffective disorder, bipolar type: Secondary | ICD-10-CM | POA: Diagnosis not present

## 2017-12-21 DIAGNOSIS — F17210 Nicotine dependence, cigarettes, uncomplicated: Secondary | ICD-10-CM | POA: Insufficient documentation

## 2017-12-21 DIAGNOSIS — F200 Paranoid schizophrenia: Secondary | ICD-10-CM

## 2017-12-21 DIAGNOSIS — Z85828 Personal history of other malignant neoplasm of skin: Secondary | ICD-10-CM | POA: Insufficient documentation

## 2017-12-21 DIAGNOSIS — F419 Anxiety disorder, unspecified: Secondary | ICD-10-CM | POA: Insufficient documentation

## 2017-12-21 DIAGNOSIS — F909 Attention-deficit hyperactivity disorder, unspecified type: Secondary | ICD-10-CM | POA: Insufficient documentation

## 2017-12-21 DIAGNOSIS — I10 Essential (primary) hypertension: Secondary | ICD-10-CM | POA: Diagnosis present

## 2017-12-21 DIAGNOSIS — J45909 Unspecified asthma, uncomplicated: Secondary | ICD-10-CM | POA: Insufficient documentation

## 2017-12-21 LAB — URINALYSIS, COMPLETE (UACMP) WITH MICROSCOPIC
Bilirubin Urine: NEGATIVE
Glucose, UA: NEGATIVE mg/dL
Ketones, ur: NEGATIVE mg/dL
Nitrite: NEGATIVE
Protein, ur: NEGATIVE mg/dL
SPECIFIC GRAVITY, URINE: 1.002 — AB (ref 1.005–1.030)
pH: 7 (ref 5.0–8.0)

## 2017-12-21 LAB — COMPREHENSIVE METABOLIC PANEL
ALK PHOS: 125 U/L (ref 38–126)
ALT: 28 U/L (ref 0–44)
AST: 28 U/L (ref 15–41)
Albumin: 3.9 g/dL (ref 3.5–5.0)
Anion gap: 8 (ref 5–15)
BUN: 13 mg/dL (ref 6–20)
CALCIUM: 8.9 mg/dL (ref 8.9–10.3)
CO2: 28 mmol/L (ref 22–32)
CREATININE: 1.27 mg/dL — AB (ref 0.44–1.00)
Chloride: 101 mmol/L (ref 98–111)
GFR calc non Af Amer: 45 mL/min — ABNORMAL LOW (ref >60)
GFR, EST AFRICAN AMERICAN: 52 mL/min — AB (ref >60)
GLUCOSE: 112 mg/dL — AB (ref 70–99)
Potassium: 3.3 mmol/L — ABNORMAL LOW (ref 3.5–5.1)
SODIUM: 137 mmol/L (ref 135–145)
Total Bilirubin: 0.5 mg/dL (ref 0.3–1.2)
Total Protein: 7.6 g/dL (ref 6.5–8.1)

## 2017-12-21 LAB — URINE DRUG SCREEN, QUALITATIVE (ARMC ONLY)
Amphetamines, Ur Screen: NOT DETECTED
Barbiturates, Ur Screen: NOT DETECTED
Benzodiazepine, Ur Scrn: NOT DETECTED
Cannabinoid 50 Ng, Ur ~~LOC~~: NOT DETECTED
Cocaine Metabolite,Ur ~~LOC~~: NOT DETECTED
MDMA (ECSTASY) UR SCREEN: NOT DETECTED
METHADONE SCREEN, URINE: NOT DETECTED
Opiate, Ur Screen: NOT DETECTED
Phencyclidine (PCP) Ur S: NOT DETECTED
TRICYCLIC, UR SCREEN: NOT DETECTED

## 2017-12-21 LAB — CBC
HCT: 38.8 % (ref 35.0–47.0)
HEMOGLOBIN: 13.8 g/dL (ref 12.0–16.0)
MCH: 37 pg — AB (ref 26.0–34.0)
MCHC: 35.6 g/dL (ref 32.0–36.0)
MCV: 104.2 fL — ABNORMAL HIGH (ref 80.0–100.0)
Platelets: 243 10*3/uL (ref 150–440)
RBC: 3.73 MIL/uL — AB (ref 3.80–5.20)
RDW: 13.6 % (ref 11.5–14.5)
WBC: 10.7 10*3/uL (ref 3.6–11.0)

## 2017-12-21 LAB — ETHANOL: Alcohol, Ethyl (B): <10 mg/dL (ref <10)

## 2017-12-21 MED ORDER — BUDESONIDE 0.25 MG/2ML IN SUSP
0.2500 mg | Freq: Two times a day (BID) | RESPIRATORY_TRACT | Status: DC
  Administered 2017-12-22 (×3): 0.25 mg via RESPIRATORY_TRACT
  Filled 2017-12-21 (×3): qty 2

## 2017-12-21 MED ORDER — FUROSEMIDE 40 MG PO TABS
20.0000 mg | ORAL_TABLET | Freq: Every day | ORAL | Status: DC
  Administered 2017-12-22: 20 mg via ORAL
  Filled 2017-12-21: qty 1

## 2017-12-21 MED ORDER — B COMPLEX-C PO TABS
1.0000 | ORAL_TABLET | Freq: Every day | ORAL | Status: DC
  Administered 2017-12-22: 1 via ORAL
  Filled 2017-12-21: qty 1

## 2017-12-21 MED ORDER — DOXEPIN HCL 10 MG PO CAPS
10.0000 mg | ORAL_CAPSULE | Freq: Every day | ORAL | Status: DC
  Administered 2017-12-22: 20 mg via ORAL
  Administered 2017-12-22: 10 mg via ORAL
  Filled 2017-12-21 (×2): qty 2

## 2017-12-21 MED ORDER — PANTOPRAZOLE SODIUM 40 MG PO TBEC
40.0000 mg | DELAYED_RELEASE_TABLET | Freq: Every day | ORAL | Status: DC
  Administered 2017-12-22: 40 mg via ORAL
  Filled 2017-12-21: qty 1

## 2017-12-21 MED ORDER — BUPROPION HCL ER (XL) 150 MG PO TB24
150.0000 mg | ORAL_TABLET | Freq: Every day | ORAL | Status: DC
  Administered 2017-12-22: 150 mg via ORAL
  Filled 2017-12-21: qty 1

## 2017-12-21 MED ORDER — BECLOMETHASONE DIPROPIONATE 80 MCG/ACT IN AERS: 2.0000 | INHALATION_SPRAY | Freq: Two times a day (BID) | RESPIRATORY_TRACT | Status: DC

## 2017-12-21 MED ORDER — AMLODIPINE BESYLATE 5 MG PO TABS
10.0000 mg | ORAL_TABLET | Freq: Every day | ORAL | Status: DC
  Administered 2017-12-22: 10 mg via ORAL
  Filled 2017-12-21: qty 2

## 2017-12-21 MED ORDER — ALBUTEROL SULFATE HFA 108 (90 BASE) MCG/ACT IN AERS
1.0000 | INHALATION_SPRAY | RESPIRATORY_TRACT | Status: DC | PRN
  Filled 2017-12-21: qty 6.7

## 2017-12-21 MED ORDER — TIOTROPIUM BROMIDE MONOHYDRATE 18 MCG IN CAPS
18.0000 ug | ORAL_CAPSULE | Freq: Every day | RESPIRATORY_TRACT | Status: DC
  Administered 2017-12-22: 18 ug via RESPIRATORY_TRACT
  Filled 2017-12-21: qty 5

## 2017-12-21 MED ORDER — CALCIUM CARBONATE-VITAMIN D 500-200 MG-UNIT PO TABS
1.0000 | ORAL_TABLET | Freq: Every day | ORAL | Status: DC
  Administered 2017-12-22: 1 via ORAL
  Filled 2017-12-21 (×2): qty 1

## 2017-12-21 MED ORDER — LEVOTHYROXINE SODIUM 50 MCG PO TABS
125.0000 ug | ORAL_TABLET | Freq: Every day | ORAL | Status: DC
  Administered 2017-12-22: 125 ug via ORAL
  Filled 2017-12-21: qty 3

## 2017-12-21 MED ORDER — FERROUS SULFATE 325 (65 FE) MG PO TABS
325.0000 mg | ORAL_TABLET | Freq: Every day | ORAL | Status: DC
  Administered 2017-12-22: 325 mg via ORAL
  Filled 2017-12-21 (×2): qty 1

## 2017-12-21 MED ORDER — ARIPIPRAZOLE 10 MG PO TABS: 20.0000 mg | ORAL_TABLET | Freq: Every day | ORAL | Status: DC

## 2017-12-21 MED ORDER — CEPHALEXIN 500 MG PO CAPS
500.0000 mg | ORAL_CAPSULE | Freq: Three times a day (TID) | ORAL | Status: DC
  Administered 2017-12-22 (×4): 500 mg via ORAL
  Filled 2017-12-21 (×4): qty 1

## 2017-12-21 MED ORDER — FLUTICASONE PROPIONATE HFA 220 MCG/ACT IN AERO: 2.0000 | INHALATION_SPRAY | Freq: Two times a day (BID) | RESPIRATORY_TRACT | Status: DC

## 2017-12-21 NOTE — ED Notes (Signed)
Pt changed into behavioral scrubs per policy. Grey tshirt, pink bra, red pants, flips flops, and pink underwear placed in labeled bag and locked in unit. Purse also placed in bag, labeled, and locked in unit.
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
## 2017-12-21 NOTE — ED Notes (Signed)
Report received from [place], EDT. Q15 minute rounding sheet taken over. Patient is currently sleeping at this time.

## 2017-12-21 NOTE — ED Notes (Signed)
Pt refused PM snack and received beverage.
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
## 2017-12-21 NOTE — ED Notes (Signed)
Pt. Transferred from Triage to room after dressing out and screening for contraband. Report to include Situation, Background, Assessment and Recommendations from RN Raquel. Pt. Oriented to Quad including Q15 minute rounds as well as Engineer, drilling for their protection. Patient is alert and oriented, warm and dry in no acute distress. Patient denies SI, HI, and AVH at the moment. Pt. Encouraged to let me know if needs arise.

## 2017-12-22 ENCOUNTER — Other Ambulatory Visit: Payer: Self-pay

## 2017-12-22 ENCOUNTER — Encounter: Payer: Self-pay | Admitting: Psychiatry

## 2017-12-22 ENCOUNTER — Inpatient Hospital Stay
Admission: AD | Admit: 2017-12-22 | Discharge: 2017-12-24 | DRG: 885 | Disposition: A | Discharge status: Home / Self Care | Payer: Medicare Other | Source: Intra-hospital | Attending: Psychiatry | Admitting: Psychiatry

## 2017-12-22 DIAGNOSIS — Z791 Long term (current) use of non-steroidal anti-inflammatories (NSAID): Secondary | ICD-10-CM

## 2017-12-22 DIAGNOSIS — Z79899 Other long term (current) drug therapy: Secondary | ICD-10-CM | POA: Diagnosis not present

## 2017-12-22 DIAGNOSIS — Z85820 Personal history of malignant melanoma of skin: Secondary | ICD-10-CM

## 2017-12-22 DIAGNOSIS — F315 Bipolar disorder, current episode depressed, severe, with psychotic features: Secondary | ICD-10-CM

## 2017-12-22 DIAGNOSIS — Z79890 Hormone replacement therapy (postmenopausal): Secondary | ICD-10-CM | POA: Diagnosis not present

## 2017-12-22 DIAGNOSIS — F250 Schizoaffective disorder, bipolar type: Secondary | ICD-10-CM | POA: Diagnosis present

## 2017-12-22 DIAGNOSIS — Z91013 Allergy to seafood: Secondary | ICD-10-CM

## 2017-12-22 DIAGNOSIS — Z7952 Long term (current) use of systemic steroids: Secondary | ICD-10-CM | POA: Diagnosis not present

## 2017-12-22 DIAGNOSIS — Z818 Family history of other mental and behavioral disorders: Secondary | ICD-10-CM

## 2017-12-22 DIAGNOSIS — F909 Attention-deficit hyperactivity disorder, unspecified type: Secondary | ICD-10-CM | POA: Diagnosis present

## 2017-12-22 DIAGNOSIS — Z23 Encounter for immunization: Secondary | ICD-10-CM

## 2017-12-22 DIAGNOSIS — E039 Hypothyroidism, unspecified: Secondary | ICD-10-CM | POA: Diagnosis present

## 2017-12-22 DIAGNOSIS — I10 Essential (primary) hypertension: Secondary | ICD-10-CM | POA: Diagnosis present

## 2017-12-22 DIAGNOSIS — F17200 Nicotine dependence, unspecified, uncomplicated: Secondary | ICD-10-CM | POA: Diagnosis present

## 2017-12-22 DIAGNOSIS — Z90710 Acquired absence of both cervix and uterus: Secondary | ICD-10-CM | POA: Diagnosis not present

## 2017-12-22 DIAGNOSIS — I739 Peripheral vascular disease, unspecified: Secondary | ICD-10-CM | POA: Diagnosis present

## 2017-12-22 DIAGNOSIS — F4312 Post-traumatic stress disorder, chronic: Secondary | ICD-10-CM | POA: Diagnosis present

## 2017-12-22 DIAGNOSIS — N390 Urinary tract infection, site not specified: Secondary | ICD-10-CM | POA: Diagnosis present

## 2017-12-22 DIAGNOSIS — J449 Chronic obstructive pulmonary disease, unspecified: Secondary | ICD-10-CM | POA: Diagnosis present

## 2017-12-22 DIAGNOSIS — Z7951 Long term (current) use of inhaled steroids: Secondary | ICD-10-CM | POA: Diagnosis not present

## 2017-12-22 DIAGNOSIS — Z811 Family history of alcohol abuse and dependence: Secondary | ICD-10-CM | POA: Diagnosis not present

## 2017-12-22 DIAGNOSIS — K219 Gastro-esophageal reflux disease without esophagitis: Secondary | ICD-10-CM | POA: Diagnosis present

## 2017-12-22 DIAGNOSIS — F4310 Post-traumatic stress disorder, unspecified: Secondary | ICD-10-CM | POA: Diagnosis present

## 2017-12-22 DIAGNOSIS — F17210 Nicotine dependence, cigarettes, uncomplicated: Secondary | ICD-10-CM | POA: Diagnosis present

## 2017-12-22 LAB — SALICYLATE LEVEL: Salicylate Lvl: <7 mg/dL (ref 2.8–30.0)

## 2017-12-22 LAB — ACETAMINOPHEN LEVEL: Acetaminophen (Tylenol), Serum: <10 ug/mL — ABNORMAL LOW (ref 10–30)

## 2017-12-22 MED ORDER — DOXEPIN HCL 10 MG PO CAPS
10.0000 mg | ORAL_CAPSULE | Freq: Every day | ORAL | Status: DC
  Administered 2017-12-23: 20 mg via ORAL
  Filled 2017-12-22 (×2): qty 2

## 2017-12-22 MED ORDER — INFLUENZA VAC SPLIT QUAD 0.5 ML IM SUSY
0.5000 mL | PREFILLED_SYRINGE | INTRAMUSCULAR | Status: AC
  Administered 2017-12-23: 0.5 mL via INTRAMUSCULAR
  Filled 2017-12-22: qty 0.5

## 2017-12-22 MED ORDER — TIOTROPIUM BROMIDE MONOHYDRATE 18 MCG IN CAPS
18.0000 ug | ORAL_CAPSULE | Freq: Every day | RESPIRATORY_TRACT | Status: DC
  Administered 2017-12-23 – 2017-12-24 (×2): 18 ug via RESPIRATORY_TRACT
  Filled 2017-12-22: qty 5

## 2017-12-22 MED ORDER — B COMPLEX-C PO TABS
1.0000 | ORAL_TABLET | Freq: Every day | ORAL | Status: DC
  Administered 2017-12-23 – 2017-12-24 (×2): 1 via ORAL
  Filled 2017-12-22 (×2): qty 1

## 2017-12-22 MED ORDER — ALBUTEROL SULFATE HFA 108 (90 BASE) MCG/ACT IN AERS
1.0000 | INHALATION_SPRAY | RESPIRATORY_TRACT | Status: DC | PRN
  Filled 2017-12-22: qty 6.7

## 2017-12-22 MED ORDER — ARIPIPRAZOLE 10 MG PO TABS
20.0000 mg | ORAL_TABLET | Freq: Every day | ORAL | Status: DC
  Administered 2017-12-22: 20 mg via ORAL
  Filled 2017-12-22: qty 2

## 2017-12-22 MED ORDER — CEPHALEXIN 500 MG PO CAPS
500.0000 mg | ORAL_CAPSULE | Freq: Three times a day (TID) | ORAL | Status: AC
  Administered 2017-12-23 – 2017-12-24 (×4): 500 mg via ORAL
  Filled 2017-12-22 (×4): qty 1

## 2017-12-22 MED ORDER — ALUM & MAG HYDROXIDE-SIMETH 200-200-20 MG/5ML PO SUSP: 30.0000 mL | ORAL | Status: DC | PRN

## 2017-12-22 MED ORDER — LORAZEPAM 1 MG PO TABS: 1.0000 mg | ORAL_TABLET | ORAL | Status: DC | PRN

## 2017-12-22 MED ORDER — PANTOPRAZOLE SODIUM 40 MG PO TBEC
40.0000 mg | DELAYED_RELEASE_TABLET | Freq: Every day | ORAL | Status: DC
  Administered 2017-12-23 – 2017-12-24 (×2): 40 mg via ORAL
  Filled 2017-12-22 (×2): qty 1

## 2017-12-22 MED ORDER — ARIPIPRAZOLE 10 MG PO TABS: 10.0000 mg | ORAL_TABLET | Freq: Every day | ORAL | Status: DC

## 2017-12-22 MED ORDER — FUROSEMIDE 20 MG PO TABS
20.0000 mg | ORAL_TABLET | Freq: Every day | ORAL | Status: DC
  Administered 2017-12-23 – 2017-12-24 (×2): 20 mg via ORAL
  Filled 2017-12-22 (×2): qty 1

## 2017-12-22 MED ORDER — AMLODIPINE BESYLATE 5 MG PO TABS
10.0000 mg | ORAL_TABLET | Freq: Every day | ORAL | Status: DC
  Administered 2017-12-23 – 2017-12-24 (×2): 10 mg via ORAL
  Filled 2017-12-22 (×2): qty 2

## 2017-12-22 MED ORDER — ACETAMINOPHEN 325 MG PO TABS: 650.0000 mg | ORAL_TABLET | Freq: Four times a day (QID) | ORAL | Status: DC | PRN

## 2017-12-22 MED ORDER — TRAZODONE HCL 100 MG PO TABS
100.0000 mg | ORAL_TABLET | Freq: Every evening | ORAL | Status: DC | PRN
  Administered 2017-12-23: 100 mg via ORAL
  Filled 2017-12-22: qty 1

## 2017-12-22 MED ORDER — CALCIUM CARBONATE-VITAMIN D 500-200 MG-UNIT PO TABS
1.0000 | ORAL_TABLET | Freq: Every day | ORAL | Status: DC
  Administered 2017-12-23 – 2017-12-24 (×2): 1 via ORAL
  Filled 2017-12-22 (×2): qty 1

## 2017-12-22 MED ORDER — ARIPIPRAZOLE 10 MG PO TABS
20.0000 mg | ORAL_TABLET | Freq: Every day | ORAL | Status: DC
  Administered 2017-12-23: 20 mg via ORAL
  Filled 2017-12-22: qty 2

## 2017-12-22 MED ORDER — FERROUS SULFATE 325 (65 FE) MG PO TABS
325.0000 mg | ORAL_TABLET | Freq: Every day | ORAL | Status: DC
  Administered 2017-12-23 – 2017-12-24 (×2): 325 mg via ORAL
  Filled 2017-12-22 (×2): qty 1

## 2017-12-22 MED ORDER — LEVOTHYROXINE SODIUM 75 MCG PO TABS
125.0000 ug | ORAL_TABLET | Freq: Every day | ORAL | Status: DC
  Administered 2017-12-23 – 2017-12-24 (×2): 125 ug via ORAL
  Filled 2017-12-22 (×2): qty 1

## 2017-12-22 MED ORDER — NICOTINE 21 MG/24HR TD PT24: 21.0000 mg | MEDICATED_PATCH | Freq: Once | TRANSDERMAL | Status: DC

## 2017-12-22 MED ORDER — MAGNESIUM HYDROXIDE 400 MG/5ML PO SUSP: 30.0000 mL | Freq: Every day | ORAL | Status: DC | PRN

## 2017-12-22 MED ORDER — HYDROXYZINE HCL 50 MG PO TABS: 50.0000 mg | ORAL_TABLET | Freq: Three times a day (TID) | ORAL | Status: DC | PRN

## 2017-12-22 MED ORDER — BUDESONIDE 0.25 MG/2ML IN SUSP
0.2500 mg | Freq: Two times a day (BID) | RESPIRATORY_TRACT | Status: DC
  Filled 2017-12-22: qty 2

## 2017-12-22 MED ORDER — BUPROPION HCL ER (XL) 150 MG PO TB24
150.0000 mg | ORAL_TABLET | Freq: Every day | ORAL | Status: DC
  Administered 2017-12-23 – 2017-12-24 (×2): 150 mg via ORAL
  Filled 2017-12-22 (×4): qty 1

## 2017-12-22 NOTE — ED Notes (Signed)
Called pharmacy to send Spiriva

## 2017-12-22 NOTE — ED Notes (Signed)
Hourly rounding reveals patient in room. No complaints, stable, in no acute distress. Q15 minute rounds and monitoring via Rover and Officer to continue.   

## 2017-12-22 NOTE — ED Notes (Signed)
Hourly rounding reveals patient in room sleeping. No complaints, stable, in no acute distress. Q15 minute rounds and monitoring via Engineer, drilling to continue.

## 2017-12-22 NOTE — ED Notes (Signed)
Pt given meal tray.

## 2017-12-22 NOTE — ED Notes (Signed)
Pt states she does not eat anything on her breakfast tray - pt given graham crackers, peanut butter and milk

## 2017-12-22 NOTE — ED Notes (Signed)
Pt. Calm and cooperative   Pt. To be admitted downstairs.  Pt. Requested her dinner be re-heated, dinner re-heated.
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
## 2017-12-22 NOTE — ED Notes (Signed)
Hourly rounding reveals patient in room sleeping. No complaints, stable, in no acute distress. Q15 minute rounds and monitoring via [employer] to continue.

## 2017-12-22 NOTE — ED Notes (Signed)
Hourly rounding reveals patient in room. Pt asked why she has to stay overnight and she was told that she need to be evaluated by the hospital psychiatrist in the morning. She verbalized understanding. She is stable, in no acute distress. Q15 minute rounds and monitoring via [employer], drilling to continue.

## 2017-12-22 NOTE — ED Notes (Signed)
Diet order placed for patient - kitchen notified
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
## 2017-12-22 NOTE — BH Assessment (Signed)
Patient is to be admitted to [facility] by Dr. Weber Cooks.  Attending Physician will be Dr. Bary Leriche.   Patient has been assigned to room 301, by [place] Nurse [place] staff is aware of the admission:  Dr. Corky Downs, ER MD   Helene Kelp, Patient's Nurse   Gust Rung., Patient Access.

## 2017-12-22 NOTE — ED Notes (Signed)
Calvin from TTS called and stated that the pt will be going to Behavioral Admission after shift change - he stated to have evening nurse call report and transport pt after protected time - pt room number will be [id number]

## 2017-12-22 NOTE — ED Notes (Signed)
Pt asleep, breakfast tray placed in rm.  
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
## 2017-12-22 NOTE — ED Notes (Signed)
Behavior Department requesting pt. Transferred to unit around 10 pm tonight.

## 2017-12-22 NOTE — ED Notes (Signed)
Hourly rounding reveals patient in room talking to [facility]. No complaints, stable, in no acute distress. Q15 minute rounds and monitoring via Engineer, drilling to continue.

## 2017-12-23 DIAGNOSIS — F250 Schizoaffective disorder, bipolar type: Principal | ICD-10-CM

## 2017-12-23 LAB — LIPID PANEL
CHOLESTEROL: 241 mg/dL — AB (ref 0–200)
HDL: 55 mg/dL (ref >40)
LDL Cholesterol: 163 mg/dL — ABNORMAL HIGH (ref 0–99)
Total CHOL/HDL Ratio: 4.4 RATIO
Triglycerides: 115 mg/dL (ref <150)
VLDL: 23 mg/dL (ref 0–40)

## 2017-12-23 LAB — TSH: TSH: 0.199 u[IU]/mL — ABNORMAL LOW (ref 0.350–4.500)

## 2017-12-23 MED ORDER — MOXIFLOXACIN HCL 0.5 % OP SOLN: 1.0000 [drp] | Freq: Three times a day (TID) | OPHTHALMIC | 0 refills | Status: DC

## 2017-12-23 MED ORDER — PREDNISOLONE ACETATE 1 % OP SUSP
1.0000 [drp] | Freq: Two times a day (BID) | OPHTHALMIC | Status: DC
  Administered 2017-12-23 – 2017-12-24 (×2): 1 [drp] via OPHTHALMIC
  Filled 2017-12-23: qty 1

## 2017-12-23 MED ORDER — NAPHAZOLINE-PHENIRAMINE 0.025-0.3 % OP SOLN
1.0000 [drp] | Freq: Four times a day (QID) | OPHTHALMIC | Status: DC | PRN
  Filled 2017-12-23: qty 5

## 2017-12-23 MED ORDER — NAPHAZOLINE-PHENIRAMINE 0.025-0.3 % OP SOLN: 1.0000 [drp] | Freq: Four times a day (QID) | OPHTHALMIC | 0 refills | Status: DC | PRN

## 2017-12-23 MED ORDER — MOXIFLOXACIN HCL 0.5 % OP SOLN
1.0000 [drp] | Freq: Three times a day (TID) | OPHTHALMIC | Status: DC
  Administered 2017-12-23 – 2017-12-24 (×2): 1 [drp] via OPHTHALMIC
  Filled 2017-12-23: qty 3

## 2017-12-23 MED ORDER — CEPHALEXIN 500 MG PO CAPS: 500.0000 mg | ORAL_CAPSULE | Freq: Three times a day (TID) | ORAL | 0 refills | Status: DC

## 2017-12-23 NOTE — BHH Suicide Risk Assessment (Signed)
[place] INPATIENT:  Family/Significant Other Suicide Prevention Education  Suicide Prevention Education:  Patient Refusal for Family/Significant Other Suicide Prevention Education: The patient Patricia Good has refused to provide written consent for family/significant other to be provided Family/Significant Other Suicide Prevention Education during admission and/or prior to discharge.  Physician notified.  Darin Engels 12/23/2017, 10:25 AM

## 2017-12-23 NOTE — Progress Notes (Signed)
Recreation Therapy Notes  Date: 12/23/2017  Time: 9:30 am  Location: Craft Room  Behavioral response: Appropriate  Intervention Topic: Self-esteem  Discussion/Intervention:  Group content today was focused on self-esteem. Patient defined self-esteem and where it comes form. The group described reasons self-esteem is important. Individuals stated things that impact self-esteem and positive ways to improve self-esteem. The group participated in the intervention "Improving Me" where patients were able to create a collage of positive things that makes them who they are. Clinical Observations/Feedback:  Patient came to group but left group crying due to unknown reasons.   Keshona Kartes LRT/CTRS         Damika Harmon 12/23/2017 11:47 AM
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
## 2017-12-23 NOTE — Progress Notes (Signed)
Recreation Therapy Notes  INPATIENT RECREATION THERAPY ASSESSMENT  Patient Details Name: XCARET MORAD MRN: [id number] DOB: 07/03/1960 Today's Date: 12/23/2017       Information Obtained From: Patient  Able to Participate in Assessment/Interview: Yes  Patient Presentation: Responsive  Reason for Admission (Per Patient): Active Symptoms, Other (Comments)(UTI)  Patient Stressors:    Coping Skills:   Other (Comment), Talk(Sit outside, house work)  Leisure Interests (2+):  Individual - TV, Music - Listen  Frequency of Recreation/Participation: Monthly  Awareness of Community Resources:     [employer]:     Current Use:    If no, Barriers?:    Expressed Interest in [place] of Residence:  Insurance underwriter  Patient Main Form of Transportation: Musician  Patient Strengths:  Kind, easy going  Patient Identified Areas of Improvement:  Pay more attention to myself  Patient Goal for Hospitalization:  To go home  Current SI (including self-harm):  No  Current HI:  No  Current AVH: No  Staff Intervention Plan: Group Attendance, Collaborate with Interdisciplinary Treatment Team  Consent to Intern Participation: N/A  Serafin Decatur 12/23/2017, 3:26 PM
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
## 2017-12-23 NOTE — Progress Notes (Signed)
Received Patricia Good this AM after breakfast, she was compliant with her medications. She described her mood as feeling exhausted and needing a good cry. Later she endorsed feeling depressed without suicidal ideations. On her Self Inventory form she rated depresion 8/10 and anxiety 0/10. Pt received her eye gtts this evening.

## 2017-12-23 NOTE — BHH Suicide Risk Assessment (Signed)
[facility] Discharge Suicide Risk Assessment   Principal Problem: Schizoaffective disorder, bipolar type [facility]) Discharge Diagnoses:  Patient Active Problem List   Diagnosis Date Noted  . Schizoaffective disorder, bipolar type ([place]) [F25.0] 12/22/2017    Priority: High  . Urinary tract infection [N39.0] 12/22/2017  . Bipolar affective disorder, depressed, severe, with psychotic behavior ([place]) [F31.5] 02/12/2017  . PTSD (post-traumatic stress disorder) [F43.10] 09/23/2015  . Arthritis [M19.90] 01/23/2015  . H/O Malignant melanoma [Z85.820] 06/15/2014  . Bipolar I disorder ([place]) [F31.9] 09/06/2013  . HTN (hypertension) [I10] 09/06/2013  . Peripheral vascular disease ([place]) [I73.9] 09/06/2013  . Tobacco use disorder [F17.200] 09/06/2013    Total Time spent with patient: 20 minutes  Musculoskeletal: Strength & Muscle Tone: within normal limits Gait & Station: normal Patient leans: N/A  Psychiatric Specialty Exam: Review of Systems  Neurological: Negative.   Psychiatric/Behavioral: Negative.   All other systems reviewed and are negative.   Blood pressure 121/73, pulse 78, temperature 98.9 F (37.2 C), temperature source Oral, resp. rate 16, height 5\' 1"  (1.549 m), weight 88.5 kg, SpO2 96 %.Body mass index is 36.84 kg/m.  General Appearance: Casual  Eye Contact::  Good  Speech:  Clear and Coherent409  Volume:  Normal  Mood:  Euthymic  Affect:  Appropriate  Thought Process:  Goal Directed and Descriptions of Associations: Intact  Orientation:  Full (Time, Place, and Person)  Thought Content:  Paranoid Ideation  Suicidal Thoughts:  No  Homicidal Thoughts:  No  Memory:  Immediate;   Fair Recent;   Fair Remote;   Fair  Judgement:  Impaired  Insight:  Shallow  Psychomotor Activity:  Normal  Concentration:  Fair  Recall:  [employer] of Knowledge:Fair  Language: Fair  Akathisia:  No  Handed:  Right  AIMS (if indicated):     Assets:  Communication Skills Desire for  Improvement Financial Resources/Insurance Housing Physical Health Resilience Social Support  Sleep:  Number of Hours: 7.5  Cognition: WNL  ADL's:  Intact   Mental Status Per Nursing Assessment::   On Admission:  NA  Demographic Factors:  Divorced or widowed, Caucasian and Living alone  Loss Factors: NA  Historical Factors: Impulsivity  Risk Reduction Factors:   Sense of responsibility to family, Positive social support and Positive therapeutic relationship  Continued Clinical Symptoms:  Bipolar Disorder:   Mixed State  Cognitive Features That Contribute To Risk:  None    Suicide Risk:  Minimal: No identifiable suicidal ideation.  Patients presenting with no risk factors but with morbid ruminations; may be classified as minimal risk based on the severity of the depressive symptoms  Follow-up Information    [facility]. Go on 12/28/2017.   Why:  Please follow up with your proivder Tuesday December 28, 2017 at 10am. Thank you.          Plan Of Care/Follow-up recommendations:  Activity:  as tolerated Diet:  low sodium heart healthy Other:  keep follow up appointmnts  Orson Slick, MD 12/24/2017, 9:01 AM

## 2017-12-23 NOTE — BHH Suicide Risk Assessment (Signed)
[facility] Admission Suicide Risk Assessment   Nursing information obtained from:  Patient Demographic factors:  Living alone, Caucasian Current Mental Status:  NA Loss Factors:  NA Historical Factors:  NA Risk Reduction Factors:  Positive social support, Positive coping skills or problem solving skills, Positive therapeutic relationship  Total Time spent with patient: 1 hour Principal Problem: Schizoaffective disorder, bipolar type ([place]) Diagnosis:   Patient Active Problem List   Diagnosis Date Noted  . Schizoaffective disorder, bipolar type ([place]) [F25.0] 12/22/2017    Priority: High  . Urinary tract infection [N39.0] 12/22/2017  . Bipolar affective disorder, depressed, severe, with psychotic behavior ([place]) [F31.5] 02/12/2017  . PTSD (post-traumatic stress disorder) [F43.10] 09/23/2015  . Arthritis [M19.90] 01/23/2015  . H/O Malignant melanoma [Z85.820] 06/15/2014  . Bipolar I disorder ([place]) [F31.9] 09/06/2013  . HTN (hypertension) [I10] 09/06/2013  . Peripheral vascular disease ([place]) [I73.9] 09/06/2013  . Tobacco use disorder [F17.200] 09/06/2013   Subjective Data: psychotic break.  Continued Clinical Symptoms:  Alcohol Use Disorder Identification Test Final Score (AUDIT): 1 The "Alcohol Use Disorders Identification Test", Guidelines for Use in Primary Care, Second Edition.  World Pharmacologist [facility]). Score between 0-7:  no or low risk or alcohol related problems. Score between 8-15:  moderate risk of alcohol related problems. Score between 16-19:  high risk of alcohol related problems. Score 20 or above:  warrants further diagnostic evaluation for alcohol dependence and treatment.   CLINICAL FACTORS:   Depression:   Impulsivity Currently Psychotic   Musculoskeletal: Strength & Muscle Tone: within normal limits Gait & Station: normal Patient leans: N/A  Psychiatric Specialty Exam: Physical Exam  Nursing note and vitals reviewed. Psychiatric: Her speech is normal.  Her affect is blunt. She is withdrawn. Thought content is paranoid and delusional. Cognition and memory are normal. She expresses impulsivity.    Review of Systems  Neurological: Negative.   Psychiatric/Behavioral: Positive for hallucinations.  All other systems reviewed and are negative.   Blood pressure 121/73, pulse 78, temperature 98.9 F (37.2 C), temperature source Oral, resp. rate 16, height 5\' 1"  (1.549 m), weight 88.5 kg, SpO2 96 %.Body mass index is 36.84 kg/m.  General Appearance: Casual  Eye Contact:  Good  Speech:  Clear and Coherent  Volume:  Normal  Mood:  Depressed and Irritable  Affect:  Flat  Thought Process:  Goal Directed and Descriptions of Associations: Intact  Orientation:  Full (Time, Place, and Person)  Thought Content:  Illogical, Delusions and Paranoid Ideation  Suicidal Thoughts:  No  Homicidal Thoughts:  No  Memory:  Immediate;   Fair Recent;   Fair Remote;   Fair  Judgement:  Poor  Insight:  Lacking  Psychomotor Activity:  Psychomotor Retardation  Concentration:  Concentration: Fair and Attention Span: Fair  Recall:  [employer] of Knowledge:  Fair  Language:  Fair  Akathisia:  No  Handed:  Right  AIMS (if indicated):     Assets:  Communication Skills Desire for Improvement Financial Resources/Insurance Housing Physical Health Resilience Social Support  ADL's:  Intact  Cognition:  WNL  Sleep:  Number of Hours: 5.15      COGNITIVE FEATURES THAT CONTRIBUTE TO RISK:  None    SUICIDE RISK:   Mild:  Suicidal ideation of limited frequency, intensity, duration, and specificity.  There are no identifiable plans, no associated intent, mild dysphoria and related symptoms, good self-control (both objective and subjective assessment), few other risk factors, and identifiable protective factors, including available and accessible social support.  PLAN OF CARE: hospital admission, medication management, discharge planning.  Patricia Good is a  57 year old female with a history of bipolar disorder admitted for psychotic break.  #Mood and psychosis -continue Abilify 20 mg daily -continue Wellbutrin 150 mg daily -continue Sinequan 20 mg nightly  #UTI -Keflex 500 mg TID  #HTN -Norvasc 10 mg daily -Furosemide 20 mg daily  -GERD -Protonix 40 mg daily  #Hypothyroidism -Synthroid 125 ucg daily  #COPD -continue inhalers  #Eye infection -continue eye drops  #Labs -lipid panel, TSH, A1C -EKG  #Disposition -discharge to her apartment -follow up with [facility]  I certify that inpatient services furnished can reasonably be expected to improve the patient's condition.   Orson Slick, MD 12/23/2017, 11:15 AM

## 2017-12-23 NOTE — Plan of Care (Addendum)
Patient found in common area upon my arrival. Patient is visible and somewhat social this evening. Patient reports she is angry regarding her admission. "The way they brought me here was not right." Patient remained polite and cooperative despite anger. Affect is congruent to stated mood. Speech is minimal but logical. Voiced no delusions. Denies SI/HI/AVH. Reports eating and voiding adequately. Compliant with HS medication including Trazodone for sleep with positive results. Compliant with staff direction and unit routine. Q 15 minute checks maintained. Will continue to monitor throughout the shift. Patient slept 7.5 hours. No apparent distress. Reports restlessness. Compliant with am medications. VSS. Will endorse care to oncoming shift.  Problem: Education: Goal: Emotional status will improve 12/23/2017 2358 by Derek Mound, RN Outcome: Progressing 12/23/2017 2350 by Derek Mound, RN Outcome: Progressing Goal: Mental status will improve 12/23/2017 2358 by Derek Mound, RN Outcome: Progressing 12/23/2017 2350 by Derek Mound, RN Outcome: Progressing Goal: Verbalization of understanding the information provided will improve Outcome: Progressing   Problem: Coping: Goal: Coping ability will improve Outcome: Progressing Goal: Will verbalize feelings Outcome: Progressing   Problem: Health Behavior/Discharge Planning: Goal: Ability to make decisions will improve Outcome: Progressing   Problem: Self-Concept: Goal: Level of anxiety will decrease 12/23/2017 2358 by Derek Mound, RN Outcome: Progressing 12/23/2017 2350 by Derek Mound, RN Outcome: Progressing

## 2017-12-23 NOTE — Plan of Care (Signed)
Pleasant, alert and oriented. Denying thoughts of self harm

## 2017-12-23 NOTE — BHH Group Notes (Signed)
[place] Group Notes:  (Nursing/MHT/Case Management/Adjunct)  Date:  12/23/2017  Time:  10:40 PM  Type of Therapy:  Group Therapy  Participation Level:  Active  Participation Quality:  Attentive  Affect:  Appropriate  Cognitive:  Appropriate  Insight:  Appropriate  Engagement in Group:  Engaged  Modes of Intervention:  Discussion  Summary of Progress/Problems: Kaleb got up from sleeping to attend group. [place] stated she did not make it to groups on this day. Contrina stated her goal was to make it back home. MHT reviewed the rules and expectations of the unit. 1. Visitation hours (2 visitors at a time, no one under 12 allowed on unit) 2. Phone hours (one long distance call per day) 3. Routine checks throughout night, encouraged to cover appropriately 4. Clean up after self, no food or drinks allowed in room 5. No touching, hugging, doing hair 6. No walking down other hallways, only room hall and main hall, or going into other's patient rooms 7. Vital times and self-inventory sheets to be completed 8. No use of last names or sharing of personal information MHT processed with patients about attending groups and availability of social workers and doctors. MHT encouraged patients to learn new coping skills while attending groups MHT encouraged communication between prescribing doctors and patients MHT encouraged patients to inform prescribing doctors of the effectiveness of the their medications Barnie Mort 12/23/2017, 10:40 PM

## 2017-12-23 NOTE — Tx Team (Signed)
Initial Treatment Plan 12/23/2017 12:01 AM Patricia Good [id number]    PATIENT STRESSORS: Loss of brother Other: Disturbed thought process   PATIENT STRENGTHS: Average or above average intelligence Capable of independent living General fund of knowledge   PATIENT IDENTIFIED PROBLEMS: hallucinations  paranoia  Delusions                 DISCHARGE CRITERIA:  Ability to meet basic life and health needs Improved stabilization in mood, thinking, and/or behavior Motivation to continue treatment in a less acute level of care  PRELIMINARY DISCHARGE PLAN: Outpatient therapy Return to previous living arrangement  PATIENT/FAMILY INVOLVEMENT: This treatment plan has been presented to and reviewed with the patient, Patricia Good.  The patient has been  given the opportunity to ask questions and make suggestions.  Ronelle Nigh, RN 12/23/2017, 12:01 AM
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
## 2017-12-23 NOTE — [facility] Group Notes (Signed)
LCSW Group Therapy Note  12/23/2017 1:15pm  Type of Therapy/Topic:  Group Therapy:  Balance in Life  Participation Level:  None  Description of Group:    This group will address the concept of balance and how it feels and looks when one is unbalanced. Patients will be encouraged to process areas in their lives that are out of balance and identify reasons for remaining unbalanced. Facilitators will guide patients in utilizing problem-solving interventions to address and correct the stressor making their life unbalanced. Understanding and applying boundaries will be explored and addressed for obtaining and maintaining a balanced life. Patients will be encouraged to explore ways to assertively make their unbalanced needs known to significant others in their lives, using other group members and facilitator for support and feedback.  Therapeutic Goals: 1. Patient will identify two or more emotions or situations they have that consume much of in their lives. 2. Patient will identify signs/triggers that life has become out of balance:  3. Patient will identify two ways to set boundaries in order to achieve balance in their lives:  4. Patient will demonstrate ability to communicate their needs through discussion and/or role plays  Summary of Patient Progress:  Nikolina was encouraged by CSW to participate in today's group discussion on balance in life, but she did not chose to actively participate.  Burnice presented appropriately and remained in the group until it adjourned.    Therapeutic Modalities:   Cognitive Behavioral Therapy Solution-Focused Therapy Assertiveness Training  Devona Konig, [place] 12/23/2017 3:34 PM

## 2017-12-24 LAB — HEMOGLOBIN A1C
Hgb A1c MFr Bld: 5.7 % — ABNORMAL HIGH (ref 4.8–5.6)
Mean Plasma Glucose: 117 mg/dL

## 2017-12-24 NOTE — Progress Notes (Signed)
Recreation Therapy Notes  INPATIENT RECREATION TR PLAN  Patient Details Name: Patricia Good MRN: [id number] DOB: 10-13-1960 Today's Date: 12/24/2017  Rec Therapy Plan Is patient appropriate for Therapeutic Recreation?: Yes Treatment times per week: at least 3 Estimated Length of Stay: 5-7 days TR Treatment/Interventions: Group participation (Comment)  Discharge Criteria Pt will be discharged from therapy if:: Discharged Treatment plan/goals/alternatives discussed and agreed upon by:: Patient/family  Discharge Summary Short term goals set: Patient will engage in groups without prompting or encouragement from LRT x3 group sessions within 5 recreation therapy group sessions Short term goals met: Adequate for discharge Progress toward goals comments: Groups attended Which groups?: Coping skills, Self-esteem Reason goals not met: N/A Therapeutic equipment acquired: N/A Reason patient discharged from therapy: Discharge from hospital Pt/family agrees with progress & goals achieved: Yes Date patient discharged from therapy: 12/24/17   Kaidyn Hernandes 12/24/2017, 1:39 PM

## 2017-12-24 NOTE — Progress Notes (Signed)
Recreation Therapy Notes   Date: 12/24/2017  Time: 9:30 am  Location: Craft Room  Behavioral response: Appropriate   Intervention Topic: Coping skills  Discussion/Intervention:  Group content on today was focused on coping skills. The group defined what coping skills are and when they can be used. Individuals described how they normally cope with thing and the coping skills they normally use. Patients expressed why it is important to cope with things and how not coping with things can affect you. The group participated in the intervention "My coping box" and made coping boxes while adding coping skills they could use in the future to the box. Clinical Observations/Feedback:  Patient attended group and identified gardening as a coping skill she uses. Individual participated in the intervention and was social with peers and staff.  Kenyetta Wimbish LRT/CTRS        Dodge Ator 12/24/2017 1:34 PM

## 2017-12-24 NOTE — Tx Team (Addendum)
Interdisciplinary Treatment and Diagnostic Plan Update  12/24/2017 Time of Session: 10:30am Patricia Good MRN: [id number]  Principal Diagnosis: Schizoaffective disorder, bipolar type [facility])  Secondary Diagnoses: Principal Problem:   Schizoaffective disorder, bipolar type ([place]) Active Problems:   HTN (hypertension)   Tobacco use disorder   PTSD (post-traumatic stress disorder)   Urinary tract infection   Current Medications:  Current Facility-Administered Medications  Medication Dose Route Frequency Provider Last Rate Last Dose  . acetaminophen (TYLENOL) tablet 650 mg  650 mg Oral Q6H PRN Clapacs, John T, MD      . albuterol (PROVENTIL HFA;VENTOLIN HFA) 108 (90 Base) MCG/ACT inhaler 1-2 puff  1-2 puff Inhalation Q4H PRN Clapacs, John T, MD      . alum & mag hydroxide-simeth (MAALOX/MYLANTA) 200-200-20 MG/5ML suspension 30 mL  30 mL Oral Q4H PRN Clapacs, John T, MD      . amLODipine (NORVASC) tablet 10 mg  10 mg Oral Daily Clapacs, Madie Reno, MD   10 mg at 12/24/17 0916  . ARIPiprazole (ABILIFY) tablet 20 mg  20 mg Oral QHS Clapacs, Madie Reno, MD   20 mg at 12/23/17 2115  . B-complex with vitamin C tablet 1 tablet  1 tablet Oral Daily Clapacs, Madie Reno, MD   1 tablet at 12/24/17 [phone]  . budesonide (PULMICORT) nebulizer solution 0.25 mg  0.25 mg Nebulization BID Clapacs, John T, MD      . buPROPion (WELLBUTRIN XL) 24 hr tablet 150 mg  150 mg Oral Daily Clapacs, Madie Reno, MD   150 mg at 12/24/17 0917  . calcium-vitamin D (OSCAL WITH D) 500-200 MG-UNIT per tablet 1 tablet  1 tablet Oral Q breakfast Clapacs, Madie Reno, MD   1 tablet at 12/24/17 0919  . doxepin (SINEQUAN) capsule 10-20 mg  10-20 mg Oral QHS Clapacs, Madie Reno, MD   20 mg at 12/23/17 2115  . ferrous sulfate tablet 325 mg  325 mg Oral Q breakfast Clapacs, Madie Reno, MD   325 mg at 12/24/17 [id number]  . furosemide (LASIX) tablet 20 mg  20 mg Oral Daily Clapacs, John T, MD   20 mg at 12/24/17 1017  . hydrOXYzine (ATARAX/VISTARIL) tablet 50 mg  50 mg Oral  TID PRN Clapacs, Madie Reno, MD      . levothyroxine (SYNTHROID, LEVOTHROID) tablet 125 mcg  125 mcg Oral QAC breakfast Clapacs, Madie Reno, MD   125 mcg at 12/24/17 5102  . LORazepam (ATIVAN) tablet 1 mg  1 mg Oral Q4H PRN Clapacs, John T, MD      . magnesium hydroxide (MILK OF MAGNESIA) suspension 30 mL  30 mL Oral Daily PRN Clapacs, John T, MD      . moxifloxacin (VIGAMOX) 0.5 % ophthalmic solution 1 drop  1 drop Both Eyes TID Pucilowska, Jolanta B, MD   1 drop at 12/24/17 0929  . naphazoline-pheniramine (NAPHCON-A) 0.025-0.3 % ophthalmic solution 1 drop  1 drop Both Eyes QID PRN Pucilowska, Jolanta B, MD      . pantoprazole (PROTONIX) EC tablet 40 mg  40 mg Oral Daily Clapacs, Madie Reno, MD   40 mg at 12/24/17 0916  . prednisoLONE acetate (PRED FORTE) 1 % ophthalmic suspension 1 drop  1 drop Both Eyes BID Pucilowska, Jolanta B, MD   1 drop at 12/24/17 0930  . tiotropium (SPIRIVA) inhalation capsule 18 mcg  18 mcg Inhalation Daily Clapacs, Madie Reno, MD   18 mcg at 12/24/17 [phone]  . traZODone (DESYREL) tablet 100 mg  100 mg  Oral QHS PRN Clapacs, Madie Reno, MD   100 mg at 12/23/17 2115   PTA Medications: Medications Prior to Admission  Medication Sig Dispense Refill Last Dose  . albuterol (PROAIR HFA) 108 (90 Base) MCG/ACT inhaler Inhale 1-2 puffs into the lungs every 4 (four) hours as needed for wheezing or shortness of breath.    Past Month at PRN  . amLODipine (NORVASC) 10 MG tablet Take 10 mg by mouth daily.   Taking  . ARIPiprazole (ABILIFY) 20 MG tablet Take 1 tablet (20 mg total) by mouth daily. 90 tablet 0   . B Complex-C (B-COMPLEX WITH VITAMIN C) tablet Take 1 tablet by mouth daily.   Taking  . buPROPion (WELLBUTRIN XL) 150 MG 24 hr tablet Take 1 tablet (150 mg total) by mouth daily. 90 tablet 0   . calcium-vitamin D (OSCAL WITH D) 500-200 MG-UNIT tablet Take 1 tablet by mouth daily with breakfast.   Taking  . doxepin (SINEQUAN) 10 MG capsule Take 1-2 capsules (10-20 mg total) by mouth at bedtime. For  sleep 180 capsule 0   . ferrous sulfate 325 (65 FE) MG tablet Take 325 mg by mouth.   Taking  . fluticasone (FLOVENT HFA) 220 MCG/ACT inhaler Inhale 2 puffs 2 (two) times daily into the lungs.   Taking  . furosemide (LASIX) 20 MG tablet Take 20 mg by mouth daily.    Taking  . hydrOXYzine (ATARAX/VISTARIL) 10 MG tablet Take 1 tablet (10 mg total) by mouth 3 (three) times daily as needed for anxiety. 270 tablet 0   . levothyroxine (SYNTHROID, LEVOTHROID) 125 MCG tablet Take 125 mcg daily before breakfast by mouth.    Taking  . omeprazole (PRILOSEC) 40 MG capsule Take 40 mg by mouth daily.    Taking  . prednisoLONE acetate (PRED FORTE) 1 % ophthalmic suspension Place 1 drop into both eyes daily.     . valACYclovir (VALTREX) 500 MG tablet Take 500 mg by mouth daily.    Taking    Patient Stressors: Loss of brother Other: Disturbed thought process  Patient Strengths: Average or above average intelligence Capable of independent living General fund of knowledge  Treatment Modalities: Medication Management, Group therapy, Case management,  1 to 1 session with clinician, Psychoeducation, Recreational therapy.   Physician Treatment Plan for Primary Diagnosis: Schizoaffective disorder, bipolar type ([place]) Long Term Goal(s): Improvement in symptoms so as ready for discharge Improvement in symptoms so as ready for discharge   Short Term Goals: Ability to identify changes in lifestyle to reduce recurrence of condition will improve Ability to verbalize feelings will improve Ability to disclose and discuss suicidal ideas Ability to demonstrate self-control will improve Ability to identify and develop effective coping behaviors will improve Ability to maintain clinical measurements within normal limits will improve Ability to identify triggers associated with substance abuse/mental health issues will improve NA  Medication Management: Evaluate patient's response, side effects, and tolerance of  medication regimen.  Therapeutic Interventions: 1 to 1 sessions, Unit Group sessions and Medication administration.  Evaluation of Outcomes: Adequate for Discharge  Physician Treatment Plan for Secondary Diagnosis: Principal Problem:   Schizoaffective disorder, bipolar type ([place]) Active Problems:   HTN (hypertension)   Tobacco use disorder   PTSD (post-traumatic stress disorder)   Urinary tract infection  Long Term Goal(s): Improvement in symptoms so as ready for discharge Improvement in symptoms so as ready for discharge   Short Term Goals: Ability to identify changes in lifestyle to reduce recurrence of condition will  improve Ability to verbalize feelings will improve Ability to disclose and discuss suicidal ideas Ability to demonstrate self-control will improve Ability to identify and develop effective coping behaviors will improve Ability to maintain clinical measurements within normal limits will improve Ability to identify triggers associated with substance abuse/mental health issues will improve NA     Medication Management: Evaluate patient's response, side effects, and tolerance of medication regimen.  Therapeutic Interventions: 1 to 1 sessions, Unit Group sessions and Medication administration.  Evaluation of Outcomes: Adequate for Discharge   RN Treatment Plan for Primary Diagnosis: Schizoaffective disorder, bipolar type ([place]) Long Term Goal(s): Knowledge of disease and therapeutic regimen to maintain health will improve  Short Term Goals: Ability to verbalize feelings will improve, Ability to identify and develop effective coping behaviors will improve and Compliance with prescribed medications will improve  Medication Management: RN will administer medications as ordered by provider, will assess and evaluate patient's response and provide education to patient for prescribed medication. RN will report any adverse and/or side effects to prescribing  provider.  Therapeutic Interventions: 1 on 1 counseling sessions, Psychoeducation, Medication administration, Evaluate responses to treatment, Monitor vital signs and CBGs as ordered, Perform/monitor CIWA, COWS, AIMS and Fall Risk screenings as ordered, Perform wound care treatments as ordered.  Evaluation of Outcomes: Adequate for Discharge   LCSW Treatment Plan for Primary Diagnosis: Schizoaffective disorder, bipolar type ([place]) Long Term Goal(s): Safe transition to appropriate next level of care at discharge, Engage patient in therapeutic group addressing interpersonal concerns.  Short Term Goals: Engage patient in aftercare planning with referrals and resources, Increase social support, Increase emotional regulation, Identify triggers associated with mental health/substance abuse issues and Increase skills for wellness and recovery  Therapeutic Interventions: Assess for all discharge needs, 1 to 1 time with Social worker, Explore available resources and support systems, Assess for adequacy in community support network, Educate family and significant other(s) on suicide prevention, Complete Psychosocial Assessment, Interpersonal group therapy.  Evaluation of Outcomes: Adequate for Discharge   Progress in Treatment: Attending groups: Yes. Participating in groups: Yes. Taking medication as prescribed: Yes. Toleration medication: Yes. Family/Significant other contact made: No, will contact:  Patient refused Patient understands diagnosis: Yes. Discussing patient identified problems/goals with staff: Yes. Medical problems stabilized or resolved: Yes. Denies suicidal/homicidal ideation: Yes. Issues/concerns per patient self-inventory: No. Other:   New problem(s) identified: No, Describe:  None  New Short Term/Long Term Goal(s): "To get better and go home."  Patient Goals:  "To get better and go home."  Discharge Plan or Barriers:  To discharge home and follow up with your outpatient  provider.  Reason for Continuation of Hospitalization: Medication stabilization  Estimated Length of Stay: Discharging today   Recreational Therapy: Patient Stressors: N/A Patient Goal: Patient will engage in groups without prompting or encouragement from LRT x3 group sessions within 5 recreation therapy group sessions  Attendees: Patient: Patricia Good 12/24/2017 10:05 AM  Physician: Dr. Bary Leriche, MD 12/24/2017 10:05 AM  Nursing:  12/24/2017 10:05 AM  RN Care Manager: 12/24/2017 10:05 AM  Social Worker: Darin Engels, [place] 12/24/2017 10:05 AM  Recreational Therapist: Isaias Sakai. Breianna Delfino CTRS, LRT 12/24/2017 10:05 AM  Other: Derrek Gu, LCSW 12/24/2017 10:05 AM  Other:  12/24/2017 10:05 AM  Other: 12/24/2017 10:05 AM    Scribe for Treatment Team: Darin Engels, LCSW 12/24/2017 10:05 AM

## 2017-12-24 NOTE — Progress Notes (Signed)
  [facility] Adult Case Management Discharge Plan :  Will you be returning to the same living situation after discharge:  Yes,  Home At discharge, do you have transportation home?: Yes,  Taxi to car Do you have the ability to pay for your medications: Yes,  Insurance  Release of information consent forms completed and in the chart;  Patient's signature needed at discharge.  Patient to Follow up at: Follow-up Information    [facility]. Go on 12/28/2017.   Why:  Please follow up with your proivder Tuesday December 28, 2017 at 10am. Thank you.          Next level of care provider has access to [place] and Suicide Prevention discussed: Yes,  Completed with patient.  Have you used any form of tobacco in the last 30 days? (Cigarettes, Smokeless Tobacco, Cigars, and/or Pipes): No  Has patient been referred to the Quitline?: N/A patient is not a smoker  Patient has been referred for addiction treatment: N/A  Darin Engels, LCSW 12/24/2017, 8:59 AM

## 2017-12-24 NOTE — Progress Notes (Signed)
Received Patricia Good this AM after breakfast, she was compliant wih her medications. She denied feeling suicidal and feels safe to be discharge home today. She received her discharge order, the AVS was reviewed and her questions answered. She received her prescriptions. Her personal belongs returned and she was discharge via taxi at [employer].

## 2017-12-24 NOTE — Progress Notes (Signed)
   12/24/17 1035  Clinical Encounter Type  Visited With Patient  Visit Type Initial;Spiritual support;Behavioral Health  Referral From Social work  Consult/Referral To Chaplain  Spiritual Encounters  Spiritual Needs Emotional;Other (Comment)   Patricia Good attended the patient's treatment team meeting. Patricia Good will be discharging today. MD suggested that her hospitalization may have been caused by a UTI and not psychological.

## 2017-12-28 ENCOUNTER — Ambulatory Visit: Payer: Medicare Other | Admitting: Psychiatry

## 2018-01-05 ENCOUNTER — Ambulatory Visit: Payer: Medicare Other | Admitting: Psychiatry

## 2018-01-05 ENCOUNTER — Other Ambulatory Visit: Payer: Self-pay

## 2018-01-05 ENCOUNTER — Encounter: Payer: Self-pay | Admitting: Psychiatry

## 2018-01-05 VITALS — BP 122/79 | HR 87 | Temp 98.3°F | Wt 199.8 lb

## 2018-01-05 DIAGNOSIS — F17200 Nicotine dependence, unspecified, uncomplicated: Secondary | ICD-10-CM | POA: Diagnosis not present

## 2018-01-05 DIAGNOSIS — F250 Schizoaffective disorder, bipolar type: Secondary | ICD-10-CM

## 2018-01-05 DIAGNOSIS — F4010 Social phobia, unspecified: Secondary | ICD-10-CM

## 2018-01-05 DIAGNOSIS — F4310 Post-traumatic stress disorder, unspecified: Secondary | ICD-10-CM

## 2018-01-05 DIAGNOSIS — F5105 Insomnia due to other mental disorder: Secondary | ICD-10-CM
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
## 2018-01-20 ENCOUNTER — Ambulatory Visit: Payer: Medicare Other | Admitting: Licensed Clinical Social Worker

## 2018-01-24 ENCOUNTER — Ambulatory Visit (INDEPENDENT_AMBULATORY_CARE_PROVIDER_SITE_OTHER): Payer: Medicare Other | Admitting: Licensed Clinical Social Worker

## 2018-01-24 DIAGNOSIS — F250 Schizoaffective disorder, bipolar type: Secondary | ICD-10-CM | POA: Diagnosis not present
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
## 2018-01-26 ENCOUNTER — Ambulatory Visit: Payer: Medicare Other | Admitting: Psychiatry

## 2018-02-02 ENCOUNTER — Ambulatory Visit: Payer: Medicare Other | Admitting: Psychiatry

## 2018-02-07 ENCOUNTER — Ambulatory Visit: Payer: Medicare Other | Admitting: Licensed Clinical Social Worker

## 2018-02-07 ENCOUNTER — Other Ambulatory Visit: Payer: Self-pay

## 2018-02-07 ENCOUNTER — Encounter: Payer: Self-pay | Admitting: Psychiatry

## 2018-02-07 ENCOUNTER — Ambulatory Visit (INDEPENDENT_AMBULATORY_CARE_PROVIDER_SITE_OTHER): Payer: Medicare Other | Admitting: Psychiatry

## 2018-02-07 VITALS — BP 123/79 | HR 86 | Temp 97.7°F | Wt 208.2 lb

## 2018-02-07 DIAGNOSIS — F5105 Insomnia due to other mental disorder: Secondary | ICD-10-CM | POA: Diagnosis not present

## 2018-02-07 DIAGNOSIS — F4010 Social phobia, unspecified: Secondary | ICD-10-CM | POA: Diagnosis not present

## 2018-02-07 DIAGNOSIS — F17200 Nicotine dependence, unspecified, uncomplicated: Secondary | ICD-10-CM

## 2018-02-07 DIAGNOSIS — F4310 Post-traumatic stress disorder, unspecified: Secondary | ICD-10-CM | POA: Diagnosis not present

## 2018-02-07 DIAGNOSIS — F250 Schizoaffective disorder, bipolar type: Secondary | ICD-10-CM | POA: Diagnosis not present

## 2018-02-07 MED ORDER — MIRTAZAPINE 7.5 MG PO TABS: 7.5000 mg | ORAL_TABLET | Freq: Every day | ORAL | 1 refills | Status: DC

## 2018-02-07 NOTE — Progress Notes (Signed)
[facility] MD OP Progress Note  02/07/2018 11:59 AM ELECIA SERAFIN  MRN:  [id number]  Chief Complaint: ' I am here for follow up." Chief Complaint    Follow-up; Medication Refill     HPI: Patricia Good is a 57 yr old female, divorced, on SSD, lives in [place], has a history of schizoaffective disorder, PTSD, anxiety disorder, vitamin B12 deficiency, tobacco use disorder, COPD, psoriasis, history of malignant melanoma, hypothyroidism, hyperlipidemia, osteoarthritis, presented to the clinic today for a follow-up visit.  Patient today reports she continues to struggle with sleep on and off.  She reports whenever she takes the doxepin she has the opposite effect to it.  She reports it keeps her awake at night.  Discussed adding mirtazapine instead of doxepin.  She agrees with plan.  She has been compliant on her other medications as prescribed.  She reports she does have some anxiety on and off.  She reports its more so due to certain situational stressors.  She reports her friend who is also her next-door neighbor recently got diagnosed with lung cancer.  She reports she has been helping her out by taking her to her appointments and things like that.  She also reports she has filed for divorce and that has been stressful.  She reports however that she and her ex-husband has been separated since the past 7 years or so.  He however does not want to pay for the divorce and hence she has to do it all by herself.  Patient otherwise denies any other concerns.  Denies any perceptual disturbances.  Patient denies any suicidality or homicidality.  Patient continues to smoke cigarettes.  She reports she is going to attend classes at the [facility] for smoking cessation. Visit Diagnosis:    ICD-10-CM   1. Schizoaffective disorder, bipolar type ([place]) F25.0   2. PTSD (post-traumatic stress disorder) F43.10   3. Social anxiety disorder F40.10   4. Insomnia due to mental condition F51.05   5. Tobacco use disorder F17.200      Past Psychiatric History: Have reviewed past psychiatric history from my progress note on 07/16/2017.  Past trials of trazodone, Prozac  Past Medical History:  Past Medical History:  Diagnosis Date  . Abnormal thyroid stimulating hormone (TSH) level   . ADHD (attention deficit hyperactivity disorder)   . Anxiety   . Asthma   . PTSD (post-traumatic stress disorder)   . Schizoaffective disorder [facility]     Past Surgical History:  Procedure Laterality Date  . ABDOMINAL HYSTERECTOMY    . ECTOPIC PREGNANCY SURGERY      Family Psychiatric History: I have reviewed family psychiatric history from my progress note on 07/16/2017  Family History:  Family History  Problem Relation Age of Onset  . Hypertension Father   . Diabetes Father   . Heart disease Father   . Blindness Father   . Obesity Brother   . Arthritis Brother   . Alcohol abuse Brother   . Drug abuse Brother   . Depression Brother   . Alcohol abuse Sister   . Drug abuse Sister   . Anxiety disorder Sister   . Depression Sister   . Breast cancer Neg Hx     Social History: I have reviewed social history from my progress note on 07/16/2017. Social History   Socioeconomic History  . Marital status: Single    Spouse name: Not on file  . Number of children: 0  . Years of education: Not on file  .  Highest education level: Associate degree: occupational, Hotel manager, or vocational program  Occupational History  . Not on file  Social Needs  . Financial resource strain: Somewhat hard  . Food insecurity:    Worry: Sometimes true    Inability: Sometimes true  . Transportation needs:    Medical: No    Non-medical: No  Tobacco Use  . Smoking status: Current Some Day Smoker    Packs/day: 0.50    Types: Cigarettes    Start date: 01/22/1969  . Smokeless tobacco: Never Used  Substance and Sexual Activity  . Alcohol use: Yes    Alcohol/week: 1.0 - 2.0 standard drinks    Types: 1 Glasses of wine per week  . Drug use: No   . Sexual activity: Not Currently  Lifestyle  . Physical activity:    Days per week: 0 days    Minutes per session: 0 min  . Stress: Very much  Relationships  . Social connections:    Talks on phone: Never    Gets together: Never    Attends religious service: Never    Active member of club or organization: No    Attends meetings of clubs or organizations: Never    Relationship status: Divorced  Other Topics Concern  . Not on file  Social History Narrative  . Not on file    Allergies:  Allergies  Allergen Reactions  . Shellfish Allergy Anaphylaxis    Only to crab meat (non-imitation), not shrimp  . Sulfa Antibiotics Hives, Anaphylaxis and Other (See Comments)  . Sulfasalazine Anaphylaxis and Hives    Other reaction(s): Other (See Comments)  . Lisinopril     Metabolic Disorder Labs: Lab Results  Component Value Date   HGBA1C 5.7 (H) 12/21/2017   MPG 117 12/21/2017   Lab Results  Component Value Date   PROLACTIN 1.2 (L) 07/12/2017   Lab Results  Component Value Date   CHOL 241 (H) 12/21/2017   TRIG 115 12/21/2017   HDL 55 12/21/2017   CHOLHDL 4.4 12/21/2017   VLDL 23 12/21/2017   LDLCALC 163 (H) 12/21/2017   LDLCALC 174 (H) 07/12/2017   Lab Results  Component Value Date   TSH 0.199 (L) 12/21/2017   TSH 0.465 07/12/2017    Therapeutic Level Labs: No results found for: LITHIUM No results found for: VALPROATE No components found for:  CBMZ  Current Medications: Current Outpatient Medications  Medication Sig Dispense Refill  . albuterol (PROAIR HFA) 108 (90 Base) MCG/ACT inhaler Inhale 1-2 puffs into the lungs every 4 (four) hours as needed for wheezing or shortness of breath.     Marland Kitchen amLODipine (NORVASC) 10 MG tablet Take 10 mg by mouth daily.    . ARIPiprazole (ABILIFY) 20 MG tablet Take 1 tablet (20 mg total) by mouth daily. 90 tablet 0  . B Complex-C (B-COMPLEX WITH VITAMIN C) tablet Take 1 tablet by mouth daily.    Marland Kitchen buPROPion (WELLBUTRIN XL) 150 MG  24 hr tablet Take 1 tablet (150 mg total) by mouth daily. 90 tablet 0  . calcium-vitamin D (OSCAL WITH D) 500-200 MG-UNIT tablet Take 1 tablet by mouth daily with breakfast.    . cephALEXin (KEFLEX) 500 MG capsule Take 1 capsule (500 mg total) by mouth every 8 (eight) hours. 30 capsule 0  . ferrous sulfate 325 (65 FE) MG tablet Take 325 mg by mouth.    . fluticasone (FLOVENT HFA) 220 MCG/ACT inhaler Inhale 2 puffs 2 (two) times daily into the lungs.    Marland Kitchen  furosemide (LASIX) 20 MG tablet Take 20 mg by mouth daily.     . hydrOXYzine (ATARAX/VISTARIL) 10 MG tablet Take 1 tablet (10 mg total) by mouth 3 (three) times daily as needed for anxiety. 270 tablet 0  . moxifloxacin (VIGAMOX) 0.5 % ophthalmic solution Place 1 drop into both eyes 3 (three) times daily. 3 mL 0  . naphazoline-pheniramine (NAPHCON-A) 0.025-0.3 % ophthalmic solution Place 1 drop into both eyes 4 (four) times daily as needed for eye irritation. 15 mL 0  . omeprazole (PRILOSEC) 40 MG capsule Take 40 mg by mouth daily.     . prednisoLONE acetate (PRED FORTE) 1 % ophthalmic suspension Place 1 drop into both eyes daily.    . valACYclovir (VALTREX) 500 MG tablet Take 500 mg by mouth daily.     Marland Kitchen levothyroxine (SYNTHROID, LEVOTHROID) 100 MCG tablet     . mirtazapine (REMERON) 7.5 MG tablet Take 1 tablet (7.5 mg total) by mouth at bedtime. For sleep 30 tablet 1   No current facility-administered medications for this visit.      Musculoskeletal: Strength & Muscle Tone: within normal limits Gait & Station: normal Patient leans: N/A  Psychiatric Specialty Exam: Review of Systems  Psychiatric/Behavioral: The patient is nervous/anxious and has insomnia.   All other systems reviewed and are negative.   Blood pressure 123/79, pulse 86, temperature 97.7 F (36.5 C), temperature source Oral, weight 208 lb 3.2 oz (94.4 kg).Body mass index is 39.34 kg/m.  General Appearance: Casual  Eye Contact:  Fair  Speech:  Clear and Coherent   Volume:  Normal  Mood:  Anxious  Affect:  Appropriate  Thought Process:  Goal Directed and Descriptions of Associations: Intact  Orientation:  Full (Time, Place, and Person)  Thought Content: WDL   Suicidal Thoughts:  No  Homicidal Thoughts:  No  Memory:  Immediate;   Fair Recent;   Fair Remote;   Fair  Judgement:  Fair  Insight:  Fair  Psychomotor Activity:  Normal  Concentration:  Concentration: Fair and Attention Span: Fair  Recall:  [employer] of Knowledge: Fair  Language: Fair  Akathisia:  No  Handed:  Right  AIMS (if indicated):0  Assets:  Communication Skills Desire for Improvement Social Support  ADL's:  Intact  Cognition: WNL  Sleep:  Poor   Screenings: AUDIT     Admission (Discharged) from 12/22/2017 in [facility]  Alcohol Use Disorder Identification Test Final Score (AUDIT)  1       Assessment and Plan: Marleah is a 57 year old Caucasian female, divorced, on SSD, lives in [place], has a history of schizoaffective disorder, anxiety disorder, PTSD, multiple medical problems including COPD, hyperlipidemia, vitamin B12 deficiency, psoriasis, hypothyroidism and osteoarthritis, presented to the clinic today for a follow-up visit.  Patient continues to struggle with sleep problems.  She also has several psychosocial stressors and is currently in psychotherapy which is helpful.  Discussed plan as noted below.   Plan Schizoaffective disorder Continue Abilify 20 mg p.o. daily Discontinue doxepin for side effects and lack of efficacy. Start mirtazapine 7.5 mg p.o. nightly. Continue Wellbutrin XL 150 mg p.o. daily.  For PTSD Continue medications as prescribed Patient has started psychotherapy sessions. Continue hydroxyzine 10 mg 3 times daily as needed  For social anxiety disorder Continue psychotherapy sessions.    For insomnia Start Remeron/Mirtazapine 7.5 mg PO qhs.  Tobacco use disorder She is trying to quit.  She will start  attending classes [facility] the [facility] for smoking  cessation. Provided smoking cessation counseling.  Follow-up in clinic in 5 weeks or sooner if needed.  More than 50 % of the time was spent for psychoeducation and supportive psychotherapy and care coordination.  This note was generated in part or whole with voice recognition software. Voice recognition is usually quite accurate but there are transcription errors that can and very often do occur. I apologize for any typographical errors that were not detected and corrected.      Ursula Alert, MD 02/07/2018, 11:59 AM

## 2018-02-07 NOTE — Patient Instructions (Signed)
Mirtazapine tablets What is this medicine? MIRTAZAPINE (mir TAZ a peen) is used to treat depression. This medicine may be used for other purposes; ask your health care provider or pharmacist if you have questions. COMMON BRAND NAME(S): Remeron What should I tell my health care provider before I take this medicine? They need to know if you have any of these conditions: -bipolar disorder -glaucoma -kidney disease -liver disease -suicidal thoughts -an unusual or allergic reaction to mirtazapine, other medicines, foods, dyes, or preservatives -pregnant or trying to get pregnant -breast-feeding How should I use this medicine? Take this medicine by mouth with a glass of water. Follow the directions on the prescription label. Take your medicine at regular intervals. Do not take your medicine more often than directed. Do not stop taking this medicine suddenly except upon the advice of your doctor. Stopping this medicine too quickly may cause serious side effects or your condition may worsen. A special MedGuide will be given to you by the pharmacist with each prescription and refill. Be sure to read this information carefully each time. Talk to your pediatrician regarding the use of this medicine in children. Special care may be needed. Overdosage: If you think you have taken too much of this medicine contact a poison control center or emergency room at once. NOTE: This medicine is only for you. Do not share this medicine with others. What if I miss a dose? If you miss a dose, take it as soon as you can. If it is almost time for your next dose, take only that dose. Do not take double or extra doses. What may interact with this medicine? Do not take this medicine with any of the following medications: -linezolid -MAOIs like Carbex, Eldepryl, Marplan, Nardil, and Parnate -methylene blue (injected into a vein) This medicine may also interact with the following medications: -alcohol -antiviral  medicines for HIV or AIDS -certain medicines that treat or prevent blood clots like warfarin -certain medicines for depression, anxiety, or psychotic disturbances -certain medicines for fungal infections like ketoconazole and itraconazole -certain medicines for migraine headache like almotriptan, eletriptan, frovatriptan, naratriptan, rizatriptan, sumatriptan, zolmitriptan -certain medicines for seizures like carbamazepine or phenytoin -certain medicines for sleep -cimetidine -erythromycin -fentanyl -lithium -medicines for blood pressure -nefazodone -rasagiline -rifampin -supplements like St. John's wort, kava kava, valerian -tramadol -tryptophan This list may not describe all possible interactions. Give your health care provider a list of all the medicines, herbs, non-prescription drugs, or dietary supplements you use. Also tell them if you smoke, drink alcohol, or use illegal drugs. Some items may interact with your medicine. What should I watch for while using this medicine? Tell your doctor if your symptoms do not get better or if they get worse. Visit your doctor or health care professional for regular checks on your progress. Because it may take several weeks to see the full effects of this medicine, it is important to continue your treatment as prescribed by your doctor. Patients and their families should watch out for new or worsening thoughts of suicide or depression. Also watch out for sudden changes in feelings such as feeling anxious, agitated, panicky, irritable, hostile, aggressive, impulsive, severely restless, overly excited and hyperactive, or not being able to sleep. If this happens, especially at the beginning of treatment or after a change in dose, call your health care professional. You may get drowsy or dizzy. Do not drive, use machinery, or do anything that needs mental alertness until you know how this medicine affects you. Do not   stand or sit up quickly, especially if  you are an older patient. This reduces the risk of dizzy or fainting spells. Alcohol may interfere with the effect of this medicine. Avoid alcoholic drinks. This medicine may cause dry eyes and blurred vision. If you wear contact lenses you may feel some discomfort. Lubricating drops may help. See your eye doctor if the problem does not go away or is severe. Your mouth may get dry. Chewing sugarless gum or sucking hard candy, and drinking plenty of water may help. Contact your doctor if the problem does not go away or is severe. What side effects may I notice from receiving this medicine? Side effects that you should report to your doctor or health care professional as soon as possible: -allergic reactions like skin rash, itching or hives, swelling of the face, lips, or tongue -anxious -changes in vision -chest pain -confusion -elevated mood, decreased need for sleep, racing thoughts, impulsive behavior -eye pain -fast, irregular heartbeat -feeling faint or lightheaded, falls -feeling agitated, angry, or irritable -fever or chills, sore throat -hallucination, loss of contact with reality -loss of balance or coordination -mouth sores -redness, blistering, peeling or loosening of the skin, including inside the mouth -restlessness, pacing, inability to keep still -seizures -stiff muscles -suicidal thoughts or other mood changes -trouble passing urine or change in the amount of urine -trouble sleeping -unusual bleeding or bruising -unusually weak or tired -vomiting Side effects that usually do not require medical attention (report to your doctor or health care professional if they continue or are bothersome): -change in appetite -constipation -dizziness -dry mouth -muscle aches or pains -nausea -tired -weight gain This list may not describe all possible side effects. Call your doctor for medical advice about side effects. You may report side effects to FDA at 1-800-FDA-1088. Where  should I keep my medicine? Keep out of the reach of children. Store at room temperature between 15 and 30 degrees C (59 and 86 degrees F) Protect from light and moisture. Throw away any unused medicine after the expiration date. NOTE: This sheet is a summary. It may not cover all possible information. If you have questions about this medicine, talk to your doctor, pharmacist, or health care provider.  2018 Elsevier/Gold Standard (2015-08-22 17:30:45)  

## 2018-02-11 NOTE — Progress Notes (Signed)
   THERAPIST PROGRESS NOTE  Session Time: 31mn  Participation Level: Active  Behavioral Response: CasualAlertEuthymic  Type of Therapy: Individual Therapy  Treatment Goals addressed: Coping  Interventions: CBT and Motivational Interviewing  Summary: Patricia ACHENBACHis a 57y.o. female who presents with continued symptoms of her diagnosis.  Therapist met with Patient in an initial therapy session to assess current mood and to build rapport. Therapist engaged Patient in discussion about her life and what is going well for her. Therapist provided support for Patient as she shared details about her life, her current stressors, mood, coping skills, her past, and her children. Therapist prompted Patient to discuss her support system and ways that she manages her daily stress, anger, and frustrations.  LCSW discussed what psychotherapy is and is not and the importance of the therapeutic relationship to include open and honest communication between client and therapist and building trust.  Reviewed advantages and disadvantages of the therapeutic process and limitations to the therapeutic relationship including LCSW's role in maintaining the safety of the client, others and those in client's care.    Suicidal/Homicidal: No  Plan: Return again in 2 weeks.  Diagnosis: Axis I: Schizoaffective Disorder    Axis II: No diagnosis    NLubertha South LCSW 02/07/2018

## 2018-02-15 ENCOUNTER — Other Ambulatory Visit: Payer: Self-pay | Admitting: Psychiatry

## 2018-02-15 DIAGNOSIS — F250 Schizoaffective disorder, bipolar type: Secondary | ICD-10-CM

## 2018-02-15 DIAGNOSIS — F4310 Post-traumatic stress disorder, unspecified: Secondary | ICD-10-CM

## 2018-03-09 ENCOUNTER — Ambulatory Visit: Payer: Medicare Other | Admitting: Licensed Clinical Social Worker

## 2018-03-09 ENCOUNTER — Encounter: Payer: Self-pay | Admitting: Psychiatry

## 2018-03-09 ENCOUNTER — Ambulatory Visit (INDEPENDENT_AMBULATORY_CARE_PROVIDER_SITE_OTHER): Payer: Medicare Other | Admitting: Psychiatry

## 2018-03-09 VITALS — BP 141/84 | HR 94 | Ht 61.0 in | Wt 216.6 lb

## 2018-03-09 DIAGNOSIS — F4310 Post-traumatic stress disorder, unspecified: Secondary | ICD-10-CM | POA: Diagnosis not present

## 2018-03-09 DIAGNOSIS — F17200 Nicotine dependence, unspecified, uncomplicated: Secondary | ICD-10-CM

## 2018-03-09 DIAGNOSIS — F250 Schizoaffective disorder, bipolar type: Secondary | ICD-10-CM

## 2018-03-09 DIAGNOSIS — F4010 Social phobia, unspecified: Secondary | ICD-10-CM

## 2018-03-09 DIAGNOSIS — F5105 Insomnia due to other mental disorder: Secondary | ICD-10-CM | POA: Diagnosis not present

## 2018-03-09 MED ORDER — ARIPIPRAZOLE 20 MG PO TABS: 20.0000 mg | ORAL_TABLET | Freq: Every day | ORAL | 0 refills | Status: DC

## 2018-03-09 MED ORDER — BUPROPION HCL ER (XL) 150 MG PO TB24: 150.0000 mg | ORAL_TABLET | Freq: Every day | ORAL | 0 refills | Status: DC

## 2018-03-09 MED ORDER — MIRTAZAPINE 7.5 MG PO TABS: 7.5000 mg | ORAL_TABLET | Freq: Every day | ORAL | 0 refills | Status: DC

## 2018-03-09 NOTE — Progress Notes (Signed)
[name]  03/09/2018 5:38 PM Patricia Good  MRN:  [id number]  Chief Complaint: ' I am here for follow up.' Chief Complaint    Follow-up     HPI: [name] is a 57 yr old female, divorced, on SSD, lives in [place], has a history of schizoaffective disorder, PTSD, anxiety disorder, vitamin B12 deficiency, tobacco use disorder, COPD, psoriasis, history of malignant melanoma, hypothyroidism, hyperlipidemia, osteoarthritis, presented to the clinic today for a follow-up visit.  Patient today reports she is currently making progress on the current medication regimen.  She reports her mood symptoms as stable.  She is sleeping well on the mirtazapine and hydroxyzine combination.  Patient denies any side effects to the medications.  She reports she did not do much for Thanksgiving holiday this year since it is the first Thanksgiving without her brother.  She reports she however cooked for herself.  Patient reports she has social support from her neighbor.  She does not have a great relationship with any of her family.  Patient denies any suicidality.  Patient denies any perceptual disturbances.  Patient continues to smoke cigarettes and has been trying to cut down.  She is interested in attending classes at the cancer center for smoking cessation. Visit Diagnosis:    ICD-10-CM   1. Schizoaffective disorder, bipolar type ([place]) F25.0 ARIPiprazole (ABILIFY) 20 MG tablet    buPROPion (WELLBUTRIN XL) 150 MG 24 hr tablet   improving  2. PTSD (post-traumatic stress disorder) F43.10 buPROPion (WELLBUTRIN XL) 150 MG 24 hr tablet    mirtazapine (REMERON) 7.5 MG tablet  3. Social anxiety disorder F40.10   4. Insomnia due to mental condition F51.05 mirtazapine (REMERON) 7.5 MG tablet   improving  5. Tobacco use disorder F17.200     Past Psychiatric History: Reviewed past psychiatric history from my progress note on 07/16/2017.  Past trials of trazodone, Prozac  Past Medical History:  Past  Medical History:  Diagnosis Date  . Abnormal thyroid stimulating hormone (TSH) level   . ADHD (attention deficit hyperactivity disorder)   . Anxiety   . Asthma   . PTSD (post-traumatic stress disorder)   . Schizoaffective disorder [facility])     Past Surgical History:  Procedure Laterality Date  . ABDOMINAL HYSTERECTOMY    . ECTOPIC PREGNANCY SURGERY      Family Psychiatric History: Reviewed family psychiatric history from my progress note on 07/16/2017. Family History:  Family History  Problem Relation Age of Onset  . Hypertension Father   . Diabetes Father   . Heart disease Father   . Blindness Father   . Obesity Brother   . Arthritis Brother   . Alcohol abuse Brother   . Drug abuse Brother   . Depression Brother   . Alcohol abuse Sister   . Drug abuse Sister   . Anxiety disorder Sister   . Depression Sister   . Breast cancer Neg Hx     Social History: Reviewed social history from my progress note on 07/16/2017. Social History   Socioeconomic History  . Marital status: Single    Spouse name: Not on file  . Number of children: 0  . Years of education: Not on file  . Highest education level: Associate degree: occupational, Hotel manager, or vocational program  Occupational History  . Not on file  Social Needs  . Financial resource strain: Somewhat hard  . Food insecurity:    Worry: Sometimes true    Inability: Sometimes true  . Transportation needs:  Medical: No    Non-medical: No  Tobacco Use  . Smoking status: Current Some Day Smoker    Packs/day: 0.50    Types: Cigarettes    Start date: 01/22/1969  . Smokeless tobacco: Never Used  Substance and Sexual Activity  . Alcohol use: Yes    Alcohol/week: 1.0 - 2.0 standard drinks    Types: 1 Glasses of wine per week  . Drug use: No  . Sexual activity: Not Currently  Lifestyle  . Physical activity:    Days per week: 0 days    Minutes per session: 0 min  . Stress: Very much  Relationships  . Social connections:     Talks on phone: Never    Gets together: Never    Attends religious service: Never    Active member of club or organization: No    Attends meetings of clubs or organizations: Never    Relationship status: Divorced  Other Topics Concern  . Not on file  Social History Narrative  . Not on file    Allergies:  Allergies  Allergen Reactions  . Shellfish Allergy Anaphylaxis    Only to crab meat (non-imitation), not shrimp  . Sulfa Antibiotics Hives, Anaphylaxis and Other (See Comments)  . Sulfasalazine Anaphylaxis and Hives    Other reaction(s): Other (See Comments)  . Lisinopril     Metabolic Disorder Labs: Lab Results  Component Value Date   HGBA1C 5.7 (H) 12/21/2017   MPG 117 12/21/2017   Lab Results  Component Value Date   PROLACTIN 1.2 (L) 07/12/2017   Lab Results  Component Value Date   CHOL 241 (H) 12/21/2017   TRIG 115 12/21/2017   HDL 55 12/21/2017   CHOLHDL 4.4 12/21/2017   VLDL 23 12/21/2017   LDLCALC 163 (H) 12/21/2017   LDLCALC 174 (H) 07/12/2017   Lab Results  Component Value Date   TSH 0.199 (L) 12/21/2017   TSH 0.465 07/12/2017    Therapeutic Level Labs: No results found for: LITHIUM No results found for: VALPROATE No components found for:  CBMZ  Current Medications: Current Outpatient Medications  Medication Sig Dispense Refill  . albuterol (PROAIR HFA) 108 (90 Base) MCG/ACT inhaler Inhale 1-2 puffs into the lungs every 4 (four) hours as needed for wheezing or shortness of breath.     Marland Kitchen amLODipine (NORVASC) 10 MG tablet Take 10 mg by mouth daily.    . ARIPiprazole (ABILIFY) 20 MG tablet Take 1 tablet (20 mg total) by mouth daily. 90 tablet 0  . B Complex-C (B-COMPLEX WITH VITAMIN C) tablet Take 1 tablet by mouth daily.    Marland Kitchen buPROPion (WELLBUTRIN XL) 150 MG 24 hr tablet Take 1 tablet (150 mg total) by mouth daily. 90 tablet 0  . calcium-vitamin D (OSCAL WITH D) 500-200 MG-UNIT tablet Take 1 tablet by mouth daily with breakfast.    .  cephALEXin (KEFLEX) 500 MG capsule Take 1 capsule (500 mg total) by mouth every 8 (eight) hours. 30 capsule 0  . ferrous sulfate 325 (65 FE) MG tablet Take 325 mg by mouth.    . fluticasone (FLOVENT HFA) 220 MCG/ACT inhaler Inhale 2 puffs 2 (two) times daily into the lungs.    . furosemide (LASIX) 20 MG tablet Take 20 mg by mouth daily.     . hydrOXYzine (ATARAX/VISTARIL) 10 MG tablet TAKE ONE TABLET BY MOUTH THREE TIMES A DAY. AS NEEDED FOR ANXIETY 270 tablet 0  . levothyroxine (SYNTHROID, LEVOTHROID) 100 MCG tablet     .  mirtazapine (REMERON) 7.5 MG tablet Take 1 tablet (7.5 mg total) by mouth at bedtime. For sleep 90 tablet 0  . moxifloxacin (VIGAMOX) 0.5 % ophthalmic solution Place 1 drop into both eyes 3 (three) times daily. 3 mL 0  . naphazoline-pheniramine (NAPHCON-A) 0.025-0.3 % ophthalmic solution Place 1 drop into both eyes 4 (four) times daily as needed for eye irritation. 15 mL 0  . omeprazole (PRILOSEC) 40 MG capsule Take 40 mg by mouth daily.     . prednisoLONE acetate (PRED FORTE) 1 % ophthalmic suspension Place 1 drop into both eyes daily.    . valACYclovir (VALTREX) 500 MG tablet Take 500 mg by mouth daily.      No current facility-administered medications for this visit.      Musculoskeletal: Strength & Muscle Tone: within normal limits Gait & Station: normal Patient leans: N/A  Psychiatric Specialty Exam: Review of Systems  Psychiatric/Behavioral: Negative for depression. The patient is not nervous/anxious.   All other systems reviewed and are negative.   Blood pressure (!) 141/84, pulse 94, height 5\' 1"  (1.549 m), weight 216 lb 9.6 oz (98.2 kg), SpO2 96 %.Body mass index is 40.93 kg/m.  General Appearance: Casual  Eye Contact:  Fair  Speech:  Normal Rate  Volume:  Normal  Mood:  Euthymic  Affect:  Congruent  Thought Process:  Goal Directed and Descriptions of Associations: Intact  Orientation:  Full (Time, Place, and Person)  Thought Content: WDL   Suicidal  Thoughts:  No  Homicidal Thoughts:  No  Memory:  Immediate;   Fair Recent;   Fair Remote;   Fair  Judgement:  Fair  Insight:  Fair  Psychomotor Activity:  Normal  Concentration:  Concentration: Fair and Attention Span: Fair  Recall:  [employer] of Knowledge: Fair  Language: Fair  Akathisia:  No  Handed:  Right  AIMS (if indicated):denies tremors, rigidity,stiffness  Assets:  Communication Skills Desire for Improvement  ADL's:  Intact  Cognition: WNL  Sleep:  Fair   Screenings: AUDIT     Admission (Discharged) from 12/22/2017 in [place]  Alcohol Use Disorder Identification Test Final Score (AUDIT)  1       Assessment and Plan: Sharena is a 57 year old Caucasian female, divorced, on SSD, lives in [place], has a history of schizoaffective disorder, anxiety disorder, PTSD, multiple medical problems including COPD, hyperlipidemia, vitamin B12 deficiency, psoriasis, hypothyroidism, osteoarthritis, presented to the clinic today for a follow-up visit.  Patient reports she is compliant with her medications and her mood symptoms are stable.  She will continue psychotherapy sessions which are beneficial.  Plan Schizoaffective disorder Abilify 20 mg p.o. daily Mirtazapine 7.5 mg p.o. nightly Wellbutrin XL 150 mg p.o. Daily.  PTSD Hydroxyzine 10 mg p.o. 3 times daily as needed  For social anxiety disorder Continue psychotherapy sessions.  Insomnia Mirtazapine 7.5 mg p.o. nightly  Tobacco use disorder She is trying to quit.  She is interested in attending classes for smoking cessation and reports she will call them today to know the location and the time.Its at the cancer center at [facility].  Follow-up in clinic in 2 months or sooner if needed.  More than 50 % of the time was spent for psychoeducation and supportive psychotherapy and care coordination.  This note was generated in part or whole with voice recognition software. Voice recognition is usually  quite accurate but there are transcription errors that can and very often do occur. I apologize for any typographical errors that  were not detected and corrected.         Ursula Alert, MD 03/09/2018, 5:38 PM

## 2018-05-10 ENCOUNTER — Encounter: Payer: Self-pay | Admitting: Psychiatry

## 2018-05-10 ENCOUNTER — Ambulatory Visit (INDEPENDENT_AMBULATORY_CARE_PROVIDER_SITE_OTHER): Payer: Medicare Other | Admitting: Psychiatry

## 2018-05-10 ENCOUNTER — Other Ambulatory Visit: Payer: Self-pay

## 2018-05-10 ENCOUNTER — Ambulatory Visit (INDEPENDENT_AMBULATORY_CARE_PROVIDER_SITE_OTHER): Payer: Medicare Other | Admitting: Licensed Clinical Social Worker

## 2018-05-10 VITALS — BP 119/80 | HR 96 | Temp 99.3°F | Wt 212.4 lb

## 2018-05-10 DIAGNOSIS — F4310 Post-traumatic stress disorder, unspecified: Secondary | ICD-10-CM

## 2018-05-10 DIAGNOSIS — F250 Schizoaffective disorder, bipolar type: Secondary | ICD-10-CM | POA: Diagnosis not present

## 2018-05-10 DIAGNOSIS — F4010 Social phobia, unspecified: Secondary | ICD-10-CM | POA: Diagnosis not present

## 2018-05-10 DIAGNOSIS — F17200 Nicotine dependence, unspecified, uncomplicated: Secondary | ICD-10-CM

## 2018-05-10 DIAGNOSIS — F5105 Insomnia due to other mental disorder: Secondary | ICD-10-CM | POA: Diagnosis not present

## 2018-05-10 MED ORDER — HYDROXYZINE HCL 10 MG PO TABS: 10.0000 mg | ORAL_TABLET | Freq: Three times a day (TID) | ORAL | 0 refills | Status: DC | PRN

## 2018-05-10 NOTE — Progress Notes (Signed)
[facility] MD OP Progress Note  05/10/2018 8:59 AM Patricia Good  MRN:  [id number]  Chief Complaint: ' I am here for follow up.' Chief Complaint    Follow-up     HPI: Patricia Good is a 58 year old female, divorced, on SSD, lives in [place] with her mother, has a history of schizoaffective disorder, PTSD, anxiety disorder, vitamin B12 deficiency, tobacco use disorder, COPD, psoriasis, history of malignant melanoma, hypothyroidism, hyperlipidemia, osteoarthritis, presented to clinic today for a follow-up visit.  Patient today reports she currently lives with her mom in [place].  She reports she is on the waiting list for his senior living community in [place].  Patient reports her mood symptoms as fair.  She denies any significant mood lability.  She denies any psychosis.  She reports sleep is good.  Patient denies any suicidality or homicidality.  She is compliant on her medications.  She denies any side effects.  Patient reports she continues to smoke cigarettes however is trying to cut down.  She denies any other concerns today. Visit Diagnosis:    ICD-10-CM   1. Schizoaffective disorder, bipolar type ([place]) F25.0 hydrOXYzine (ATARAX/VISTARIL) 10 MG tablet  2. PTSD (post-traumatic stress disorder) F43.10 hydrOXYzine (ATARAX/VISTARIL) 10 MG tablet  3. Social anxiety disorder F40.10   4. Insomnia due to mental condition F51.05   5. Tobacco use disorder F17.200     Past Psychiatric History: I have reviewed past psychiatric history from my progress note on 07/16/2017.  Past trials of trazodone, Prozac.  Past Medical History:  Past Medical History:  Diagnosis Date  . Abnormal thyroid stimulating hormone (TSH) level   . ADHD (attention deficit hyperactivity disorder)   . Anxiety   . Asthma   . PTSD (post-traumatic stress disorder)   . Schizoaffective disorder [facility])     Past Surgical History:  Procedure Laterality Date  . ABDOMINAL HYSTERECTOMY    . ECTOPIC PREGNANCY SURGERY      Family  Psychiatric History: Reviewed family psychiatric history from my progress note on 07/16/2017.  Family History:  Family History  Problem Relation Age of Onset  . Hypertension Father   . Diabetes Father   . Heart disease Father   . Blindness Father   . Obesity Brother   . Arthritis Brother   . Alcohol abuse Brother   . Drug abuse Brother   . Depression Brother   . Alcohol abuse Sister   . Drug abuse Sister   . Anxiety disorder Sister   . Depression Sister   . Breast cancer Neg Hx     Social History: Reviewed social history from my progress note on 07/16/2017. Social History   Socioeconomic History  . Marital status: Single    Spouse name: Not on file  . Number of children: 0  . Years of education: Not on file  . Highest education level: Associate degree: occupational, Hotel manager, or vocational program  Occupational History  . Not on file  Social Needs  . Financial resource strain: Somewhat hard  . Food insecurity:    Worry: Sometimes true    Inability: Sometimes true  . Transportation needs:    Medical: No    Non-medical: No  Tobacco Use  . Smoking status: Current Some Day Smoker    Packs/day: 0.50    Types: Cigarettes    Start date: 01/22/1969  . Smokeless tobacco: Never Used  Substance and Sexual Activity  . Alcohol use: Yes    Alcohol/week: 1.0 - 2.0 standard drinks    Types: 1  Glasses of wine per week  . Drug use: No  . Sexual activity: Not Currently  Lifestyle  . Physical activity:    Days per week: 0 days    Minutes per session: 0 min  . Stress: Very much  Relationships  . Social connections:    Talks on phone: Never    Gets together: Never    Attends religious service: Never    Active member of club or organization: No    Attends meetings of clubs or organizations: Never    Relationship status: Divorced  Other Topics Concern  . Not on file  Social History Narrative  . Not on file    Allergies:  Allergies  Allergen Reactions  . Shellfish  Allergy Anaphylaxis    Only to crab meat (non-imitation), not shrimp  . Sulfa Antibiotics Hives, Anaphylaxis and Other (See Comments)  . Sulfasalazine Anaphylaxis and Hives    Other reaction(s): Other (See Comments)  . Lisinopril     Metabolic Disorder Labs: Lab Results  Component Value Date   HGBA1C 5.7 (H) 12/21/2017   MPG 117 12/21/2017   Lab Results  Component Value Date   PROLACTIN 1.2 (L) 07/12/2017   Lab Results  Component Value Date   CHOL 241 (H) 12/21/2017   TRIG 115 12/21/2017   HDL 55 12/21/2017   CHOLHDL 4.4 12/21/2017   VLDL 23 12/21/2017   LDLCALC 163 (H) 12/21/2017   LDLCALC 174 (H) 07/12/2017   Lab Results  Component Value Date   TSH 0.199 (L) 12/21/2017   TSH 0.465 07/12/2017    Therapeutic Level Labs: No results found for: LITHIUM No results found for: VALPROATE No components found for:  CBMZ  Current Medications: Current Outpatient Medications  Medication Sig Dispense Refill  . albuterol (PROAIR HFA) 108 (90 Base) MCG/ACT inhaler Inhale 1-2 puffs into the lungs every 4 (four) hours as needed for wheezing or shortness of breath.     Marland Kitchen amLODipine (NORVASC) 10 MG tablet Take 10 mg by mouth daily.    . ARIPiprazole (ABILIFY) 20 MG tablet Take 1 tablet (20 mg total) by mouth daily. 90 tablet 0  . B Complex-C (B-COMPLEX WITH VITAMIN C) tablet Take 1 tablet by mouth daily.    Marland Kitchen buPROPion (WELLBUTRIN XL) 150 MG 24 hr tablet Take 1 tablet (150 mg total) by mouth daily. 90 tablet 0  . calcium-vitamin D (OSCAL WITH D) 500-200 MG-UNIT tablet Take 1 tablet by mouth daily with breakfast.    . cephALEXin (KEFLEX) 500 MG capsule Take 1 capsule (500 mg total) by mouth every 8 (eight) hours. 30 capsule 0  . ferrous sulfate 325 (65 FE) MG tablet Take 325 mg by mouth.    . fluticasone (FLOVENT HFA) 220 MCG/ACT inhaler Inhale 2 puffs 2 (two) times daily into the lungs.    . furosemide (LASIX) 20 MG tablet Take 20 mg by mouth daily.     . hydrOXYzine  (ATARAX/VISTARIL) 10 MG tablet Take 1 tablet (10 mg total) by mouth 3 (three) times daily as needed. for anxiety 270 tablet 0  . levothyroxine (SYNTHROID, LEVOTHROID) 100 MCG tablet     . levothyroxine (SYNTHROID, LEVOTHROID) 112 MCG tablet     . mirtazapine (REMERON) 7.5 MG tablet Take 1 tablet (7.5 mg total) by mouth at bedtime. For sleep 90 tablet 0  . moxifloxacin (VIGAMOX) 0.5 % ophthalmic solution Place 1 drop into both eyes 3 (three) times daily. 3 mL 0  . naphazoline-pheniramine (NAPHCON-A) 0.025-0.3 % ophthalmic solution Place  1 drop into both eyes 4 (four) times daily as needed for eye irritation. 15 mL 0  . omeprazole (PRILOSEC) 40 MG capsule Take 40 mg by mouth daily.     . prednisoLONE acetate (PRED FORTE) 1 % ophthalmic suspension Place 1 drop into both eyes daily.    Marland Kitchen SPIRIVA HANDIHALER 18 MCG inhalation capsule     . valACYclovir (VALTREX) 500 MG tablet Take 500 mg by mouth daily.      No current facility-administered medications for this visit.      Musculoskeletal: Strength & Muscle Tone: within normal limits Gait & Station: normal Patient leans: N/A  Psychiatric Specialty Exam: Review of Systems  Psychiatric/Behavioral: Negative for depression, hallucinations, substance abuse and suicidal ideas.  All other systems reviewed and are negative.   Blood pressure 119/80, pulse 96, temperature 99.3 F (37.4 C), temperature source Oral, weight 212 lb 6.4 oz (96.3 kg).Body mass index is 40.13 kg/m.  General Appearance: Casual  Eye Contact:  Fair  Speech:  Clear and Coherent  Volume:  Normal  Mood:  Euthymic  Affect:  Appropriate  Thought Process:  Goal Directed and Descriptions of Associations: Intact  Orientation:  Full (Time, Place, and Person)  Thought Content: Logical   Suicidal Thoughts:  No  Homicidal Thoughts:  No  Memory:  Immediate;   Fair Recent;   Fair Remote;   Fair  Judgement:  Fair  Insight:  Fair  Psychomotor Activity:  Normal  Concentration:   Concentration: Fair and Attention Span: Fair  Recall:  [employer] of Knowledge: Fair  Language: Fair  Akathisia:  No  Handed:  Right  AIMS (if indicated): 0  Assets:  Communication Skills Desire for Improvement Social Support  ADL's:  Intact  Cognition: WNL  Sleep:  Fair   Screenings: AUDIT     Admission (Discharged) from 12/22/2017 in [place]  Alcohol Use Disorder Identification Test Final Score (AUDIT)  1       Assessment and Plan: Brigett is a 58 year old Caucasian female, divorced, on SSD, lives in [place] with her mother, has a history of schizoaffective disorder, anxiety disorder, PTSD, COPD, hyperlipidemia, vitamin B12 deficiency, psoriasis, hypothyroidism, osteoarthritis, presented to clinic today for a follow-up visit.  Patient reports she is currently doing well on the current medication regimen.  She will continue psychotherapy sessions.  Plan Schizoaffective disorder-stable Abilify 20 mg p.o. daily Mirtazapine 7.5 mg p.o. nightly Wellbutrin XL 150 mg p.o. daily  PTSD-stable Hydroxyzine 10 mg p.o. 3 times daily as needed  For social anxiety disorder-stable Continue psychotherapy sessions  For insomnia-stable Mirtazapine 7.5 mg p.o. nightly  For tobacco use disorder-unstable  she is trying to quit-smoking cessation counseling.  Follow-up in clinic in 2 months or sooner if needed.  I have spent atleast 15 minutes face to face with patient today. More than 50 % of the time was spent for psychoeducation and supportive psychotherapy and care coordination.  This note was generated in part or whole with voice recognition software. Voice recognition is usually quite accurate but there are transcription errors that can and very often do occur. I apologize for any typographical errors that were not detected and corrected.        Ursula Alert, MD 05/10/2018, 8:59 AM

## 2018-05-18 ENCOUNTER — Other Ambulatory Visit: Payer: Self-pay | Admitting: Psychiatry

## 2018-05-18 DIAGNOSIS — F250 Schizoaffective disorder, bipolar type: Secondary | ICD-10-CM

## 2018-05-26 ENCOUNTER — Other Ambulatory Visit: Payer: Self-pay

## 2018-05-26 ENCOUNTER — Encounter: Payer: Self-pay | Admitting: Psychiatry

## 2018-05-26 ENCOUNTER — Ambulatory Visit (INDEPENDENT_AMBULATORY_CARE_PROVIDER_SITE_OTHER): Payer: Medicare Other | Admitting: Psychiatry

## 2018-05-26 VITALS — BP 132/86 | HR 94 | Temp 98.9°F | Wt 214.4 lb

## 2018-05-26 DIAGNOSIS — F5105 Insomnia due to other mental disorder: Secondary | ICD-10-CM

## 2018-05-26 DIAGNOSIS — F4010 Social phobia, unspecified: Secondary | ICD-10-CM | POA: Diagnosis not present

## 2018-05-26 DIAGNOSIS — F4310 Post-traumatic stress disorder, unspecified: Secondary | ICD-10-CM

## 2018-05-26 DIAGNOSIS — F17200 Nicotine dependence, unspecified, uncomplicated: Secondary | ICD-10-CM

## 2018-05-26 DIAGNOSIS — F250 Schizoaffective disorder, bipolar type: Secondary | ICD-10-CM

## 2018-05-26 MED ORDER — BUPROPION HCL ER (XL) 150 MG PO TB24: 150.0000 mg | ORAL_TABLET | Freq: Every day | ORAL | 0 refills | Status: DC

## 2018-05-26 MED ORDER — MIRTAZAPINE 7.5 MG PO TABS: 7.5000 mg | ORAL_TABLET | Freq: Every day | ORAL | 0 refills | Status: DC

## 2018-05-26 MED ORDER — ARIPIPRAZOLE 30 MG PO TABS: 30.0000 mg | ORAL_TABLET | Freq: Every day | ORAL | 0 refills | Status: DC
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
## 2018-06-06 ENCOUNTER — Ambulatory Visit: Payer: Medicaid Other | Admitting: Psychiatry

## 2018-06-21 ENCOUNTER — Other Ambulatory Visit: Payer: Self-pay

## 2018-06-21 ENCOUNTER — Ambulatory Visit (INDEPENDENT_AMBULATORY_CARE_PROVIDER_SITE_OTHER): Payer: Medicare Other | Admitting: Licensed Clinical Social Worker

## 2018-06-21 DIAGNOSIS — F250 Schizoaffective disorder, bipolar type: Secondary | ICD-10-CM | POA: Diagnosis not present

## 2018-06-30 NOTE — Progress Notes (Signed)
THERAPIST PROGRESS NOTE  Session Time: 45 min  Participation Level: Active  Behavioral Response: CasualAlertAnxious  Type of Therapy: Individual Therapy  Treatment Goals addressed: Anxiety  Interventions: CBT and Motivational Interviewing  Summary: Patricia Good is a 58 y.o. female who presents with continued symptoms.  Therapist met with Patient in an outpatient setting to assess current mood and assist with making progress towards goals through the use of therapeutic intervention. Therapist did a brief mood check, assessing anger, fear, disgust, excitement, happiness, and sadness.  Patient reports "ok" mood.  Patient was slightly reserved for the first 10-15 minutes of the session.  Patient reported that she "wasn't feeling herself." Patient was open to discussion facts about depression and her current and past symptoms.  Patient reports that she has no energy and lack motivation to do anything.  Patient reports that she did not go to work yesterday due to poor energy levels.  Patient reports taking medication as prescribed. Patient completed the worksheet and was able to verbalize when she has experienced specific symptoms such as: sleeping too much or too little, feeling guilty, trouble concentrating, etc. Patient reports that she is afraid that someone will get her journal and read all of her secrets.  Patient was open and engaged while discussing famous people with depression.  Patient was able to understand negative stigma about depression.  Patient reports that she will try to journal.    Suicidal/Homicidal: No  Plan: Return again in2 weeks.  Diagnosis: Axis I: Schizoaffective Disorder    Axis II: No diagnosis    Nicole M Peacock, LCSW 05/10/2018  

## 2018-07-08 ENCOUNTER — Other Ambulatory Visit: Payer: Self-pay

## 2018-07-08 ENCOUNTER — Ambulatory Visit: Payer: Medicaid Other | Admitting: Psychiatry

## 2018-07-08 ENCOUNTER — Ambulatory Visit (INDEPENDENT_AMBULATORY_CARE_PROVIDER_SITE_OTHER): Payer: Medicare Other | Admitting: Licensed Clinical Social Worker

## 2018-07-08 DIAGNOSIS — F250 Schizoaffective disorder, bipolar type: Secondary | ICD-10-CM

## 2018-07-19 ENCOUNTER — Ambulatory Visit (INDEPENDENT_AMBULATORY_CARE_PROVIDER_SITE_OTHER): Payer: Medicare Other | Admitting: Psychiatry

## 2018-07-19 ENCOUNTER — Other Ambulatory Visit: Payer: Self-pay

## 2018-07-19 ENCOUNTER — Encounter: Payer: Self-pay | Admitting: Psychiatry

## 2018-07-19 DIAGNOSIS — F4010 Social phobia, unspecified: Secondary | ICD-10-CM

## 2018-07-19 DIAGNOSIS — F250 Schizoaffective disorder, bipolar type: Secondary | ICD-10-CM | POA: Diagnosis not present

## 2018-07-19 DIAGNOSIS — F5105 Insomnia due to other mental disorder: Secondary | ICD-10-CM | POA: Diagnosis not present

## 2018-07-19 DIAGNOSIS — F4310 Post-traumatic stress disorder, unspecified: Secondary | ICD-10-CM

## 2018-07-19 DIAGNOSIS — F17200 Nicotine dependence, unspecified, uncomplicated: Secondary | ICD-10-CM

## 2018-07-19 MED ORDER — HYDROXYZINE HCL 10 MG PO TABS: 10.0000 mg | ORAL_TABLET | Freq: Three times a day (TID) | ORAL | 0 refills | Status: DC | PRN

## 2018-07-19 NOTE — Progress Notes (Signed)
TC on  07-19-18 spoke with patient @9 :07 pt medical and surgical hx was reviewed with no changes, pt allergies were reviewed with nothing new. Pt medications and pharmacy were reviewed with no new changes. No vitals taken due to this is a phone consult.

## 2018-07-19 NOTE — Progress Notes (Signed)
Virtual Visit via Telephone Note  I connected with Patricia Good on 07/19/18 at 10:00 AM EDT by telephone and verified that I am speaking with the correct person using two identifiers.   I discussed the limitations, risks, security and privacy concerns of performing an evaluation and management service by telephone and the availability of in person appointments. I also discussed with the patient that there may be a patient responsible charge related to this service. The patient expressed understanding and agreed to proceed.  I discussed the assessment and treatment plan with the patient. The patient was provided an opportunity to ask questions and all were answered. The patient agreed with the plan and demonstrated an understanding of the instructions.   The patient was advised to call back or seek an in-person evaluation if the symptoms worsen or if the condition fails to improve as anticipated.    [place] MD OP Progress Note  07/19/2018 12:46 PM SPECIAL RANES  MRN:  [id number]  Chief Complaint:  Chief Complaint    Follow-up     HPI: Patricia Good is a 58 yr old female , divorced , lives in [place] with her mother , has a history of schizoaffective disorder , PTSD , social anxiety disorder , VItamin b12 deficiency , Tobacco use disorder, COPD , Psoriasis, History of malignant melanoma, Hypothyroidism , OA , was evaluated by phone today.  Patient today reports she continues to live with her mother in [place].  She reports they are keeping each other company and trying to support each other.  She reports so far she has been doing okay.  She denies any significant depressive symptoms.  She does have some anxiety about the current situational stressors however is coping okay.  She is compliant with her medications as prescribed.  Patient reports she sleeps okay.  Her appetite seems to be limited however she has been eating at least 3 meals per day.  Patient denies any perceptual disturbances.  She  continues to smoke cigarettes, is trying to cut down.  She smokes 5 to 7 cigarettes right now daily.  She is not interested in medications for smoking cessation and reports she can do it by herself.  She denies any other concerns today. Visit Diagnosis:    ICD-10-CM   1. Schizoaffective disorder, bipolar type ([place]) F25.0 hydrOXYzine (ATARAX/VISTARIL) 10 MG tablet  2. PTSD (post-traumatic stress disorder) F43.10 hydrOXYzine (ATARAX/VISTARIL) 10 MG tablet  3. Social anxiety disorder F40.10   4. Insomnia due to mental condition F51.05   5. Tobacco use disorder F17.200     Past Psychiatric History: I have reviewed past psychiatric history from my progress note on 07/16/2017.  Past trials of Prozac, trazodone  Past Medical History:  Past Medical History:  Diagnosis Date  . Abnormal thyroid stimulating hormone (TSH) level   . ADHD (attention deficit hyperactivity disorder)   . Anxiety   . Asthma   . PTSD (post-traumatic stress disorder)   . Schizoaffective disorder [facility])     Past Surgical History:  Procedure Laterality Date  . ABDOMINAL HYSTERECTOMY    . ECTOPIC PREGNANCY SURGERY      Family Psychiatric History: Reviewed family psychiatric history from my progress note on 07/16/2017.  Family History:  Family History  Problem Relation Age of Onset  . Hypertension Father   . Diabetes Father   . Heart disease Father   . Blindness Father   . Obesity Brother   . Arthritis Brother   . Alcohol abuse Brother   .  Drug abuse Brother   . Depression Brother   . Alcohol abuse Sister   . Drug abuse Sister   . Anxiety disorder Sister   . Depression Sister   . Breast cancer Neg Hx     Social History: Reviewed social history from my progress note on 07/16/2017. Social History   Socioeconomic History  . Marital status: Single    Spouse name: Not on file  . Number of children: 0  . Years of education: Not on file  . Highest education level: Associate degree: occupational, Hotel manager, or  vocational program  Occupational History  . Not on file  Social Needs  . Financial resource strain: Somewhat hard  . Food insecurity:    Worry: Sometimes true    Inability: Sometimes true  . Transportation needs:    Medical: No    Non-medical: No  Tobacco Use  . Smoking status: Current Some Day Smoker    Packs/day: 0.50    Types: Cigarettes    Start date: 01/22/1969  . Smokeless tobacco: Never Used  Substance and Sexual Activity  . Alcohol use: Yes    Alcohol/week: 1.0 - 2.0 standard drinks    Types: 1 Glasses of wine per week  . Drug use: No  . Sexual activity: Not Currently  Lifestyle  . Physical activity:    Days per week: 0 days    Minutes per session: 0 min  . Stress: Very much  Relationships  . Social connections:    Talks on phone: Never    Gets together: Never    Attends religious service: Never    Active member of club or organization: No    Attends meetings of clubs or organizations: Never    Relationship status: Divorced  Other Topics Concern  . Not on file  Social History Narrative  . Not on file    Allergies:  Allergies  Allergen Reactions  . Shellfish Allergy Anaphylaxis    Only to crab meat (non-imitation), not shrimp  . Sulfa Antibiotics Hives, Anaphylaxis and Other (See Comments)  . Sulfasalazine Anaphylaxis and Hives    Other reaction(s): Other (See Comments)  . Lisinopril     Metabolic Disorder Labs: Lab Results  Component Value Date   HGBA1C 5.7 (H) 12/21/2017   MPG 117 12/21/2017   Lab Results  Component Value Date   PROLACTIN 1.2 (L) 07/12/2017   Lab Results  Component Value Date   CHOL 241 (H) 12/21/2017   TRIG 115 12/21/2017   HDL 55 12/21/2017   CHOLHDL 4.4 12/21/2017   VLDL 23 12/21/2017   LDLCALC 163 (H) 12/21/2017   LDLCALC 174 (H) 07/12/2017   Lab Results  Component Value Date   TSH 0.199 (L) 12/21/2017   TSH 0.465 07/12/2017    Therapeutic Level Labs: No results found for: LITHIUM No results found for:  VALPROATE No components found for:  CBMZ  Current Medications: Current Outpatient Medications  Medication Sig Dispense Refill  . albuterol (PROAIR HFA) 108 (90 Base) MCG/ACT inhaler Inhale 1-2 puffs into the lungs every 4 (four) hours as needed for wheezing or shortness of breath.     Marland Kitchen amLODipine (NORVASC) 10 MG tablet Take 10 mg by mouth daily.    . ARIPiprazole (ABILIFY) 30 MG tablet Take 1 tablet (30 mg total) by mouth daily. 90 tablet 0  . B Complex-C (B-COMPLEX WITH VITAMIN C) tablet Take 1 tablet by mouth daily.    Marland Kitchen buPROPion (WELLBUTRIN XL) 150 MG 24 hr tablet Take 1 tablet (  150 mg total) by mouth daily. 90 tablet 0  . calcium-vitamin D (OSCAL WITH D) 500-200 MG-UNIT tablet Take 1 tablet by mouth daily with breakfast.    . ferrous sulfate 325 (65 FE) MG tablet Take 325 mg by mouth.    . fluticasone (FLOVENT HFA) 220 MCG/ACT inhaler Inhale 2 puffs 2 (two) times daily into the lungs.    . furosemide (LASIX) 20 MG tablet Take 20 mg by mouth daily.     . hydrOXYzine (ATARAX/VISTARIL) 10 MG tablet Take 1 tablet (10 mg total) by mouth 3 (three) times daily as needed. for anxiety 270 tablet 0  . levothyroxine (SYNTHROID, LEVOTHROID) 112 MCG tablet     . mirtazapine (REMERON) 7.5 MG tablet Take 1 tablet (7.5 mg total) by mouth at bedtime. For sleep 90 tablet 0  . omeprazole (PRILOSEC) 40 MG capsule Take 40 mg by mouth daily.     Marland Kitchen SPIRIVA HANDIHALER 18 MCG inhalation capsule     . valACYclovir (VALTREX) 500 MG tablet Take 500 mg by mouth daily.      No current facility-administered medications for this visit.      Musculoskeletal: Strength & Muscle Tone: UTA Gait & Station: UTA Patient leans: N/A  Psychiatric Specialty Exam: Review of Systems  Psychiatric/Behavioral: The patient is nervous/anxious.   All other systems reviewed and are negative.   There were no vitals taken for this visit.There is no height or weight on file to calculate BMI.  General Appearance: UTA  Eye  Contact:  UTA  Speech:  Normal Rate  Volume:  Normal  Mood:  Anxious  Affect:  UTA  Thought Process:  Goal Directed and Descriptions of Associations: Intact  Orientation:  Full (Time, Place, and Person)  Thought Content: Logical   Suicidal Thoughts:  No  Homicidal Thoughts:  No  Memory:  Immediate;   Fair Recent;   Fair Remote;   Fair  Judgement:  Fair  Insight:  Fair  Psychomotor Activity:  UTA  Concentration:  Concentration: Fair and Attention Span: Fair  Recall:  [employer] of Knowledge: Fair  Language: Fair  Akathisia:  No  Handed:  Right  AIMS (if indicated): Denies tremors, rigidity,stiffness  Assets:  Communication Skills Desire for Improvement Housing Social Support  ADL's:  Intact  Cognition: WNL  Sleep:  Fair   Screenings: AUDIT     Admission (Discharged) from 12/22/2017 in [facility]  Alcohol Use Disorder Identification Test Final Score (AUDIT)  1       Assessment and Plan: Keilynn is a 58 year old Caucasian female, divorced, on SSD, lives in [place], has a history of schizoaffective disorder, social anxiety disorder, PTSD, COPD, hyperlipidemia, vitamin B12 deficiency, psoriasis, hypothyroidism, osteoarthritis was evaluated by phone today.  Patient is currently making progress on the current medication regimen.  We will continue plan as noted below.  Plan Schizoaffective disorder-improving Abilify 30 mg p.o. daily Mirtazapine 7.5 mg p.o. nightly Wellbutrin XL 150 mg p.o. daily  For PTSD-improving Abilify as prescribed. Mirtazapine and Wellbutrin as prescribed.   Continue hydroxyzine 10 mg p.o. 3 times daily as needed  Social anxiety disorder-improving Continue psychotherapy sessions  For insomnia-improving Mirtazapine 7.5 mg p.o. nightly  For tobacco use disorder- improving She is trying to cut down. Patient provided smoking cessation counseling.  Follow-up in clinic in 6 to 8 weeks or sooner if needed.  I have spent atleast  15 minutes non face to face with patient today. More than 50 % of the time  was spent for psychoeducation and supportive psychotherapy and care coordination.  This note was generated in part or whole with voice recognition software. Voice recognition is usually quite accurate but there are transcription errors that can and very often do occur. I apologize for any typographical errors that were not detected and corrected.        Ursula Alert, MD 07/19/2018, 12:46 PM

## 2018-08-01 ENCOUNTER — Telehealth: Payer: Self-pay

## 2018-08-01 ENCOUNTER — Other Ambulatory Visit: Payer: Self-pay

## 2018-08-01 ENCOUNTER — Encounter: Payer: Self-pay | Admitting: Psychiatry

## 2018-08-01 ENCOUNTER — Ambulatory Visit (INDEPENDENT_AMBULATORY_CARE_PROVIDER_SITE_OTHER): Payer: Medicare Other | Admitting: Psychiatry

## 2018-08-01 DIAGNOSIS — F4010 Social phobia, unspecified: Secondary | ICD-10-CM

## 2018-08-01 DIAGNOSIS — F4310 Post-traumatic stress disorder, unspecified: Secondary | ICD-10-CM

## 2018-08-01 DIAGNOSIS — F250 Schizoaffective disorder, bipolar type: Secondary | ICD-10-CM

## 2018-08-01 DIAGNOSIS — F17200 Nicotine dependence, unspecified, uncomplicated: Secondary | ICD-10-CM

## 2018-08-01 DIAGNOSIS — F5105 Insomnia due to other mental disorder: Secondary | ICD-10-CM

## 2018-08-01 MED ORDER — BUPROPION HCL ER (XL) 300 MG PO TB24: 300.0000 mg | ORAL_TABLET | Freq: Every day | ORAL | 0 refills | Status: DC

## 2018-08-01 NOTE — Progress Notes (Signed)
Virtual Visit via Telephone Note  I connected with Patricia Good on 08/01/18 at  10:30 AM EDT by telephone and verified that I am speaking with the correct person using two identifiers.   I discussed the limitations, risks, security and privacy concerns of performing an evaluation and management service by telephone and the availability of in person appointments. I also discussed with the patient that there may be a patient responsible charge related to this service. The patient expressed understanding and agreed to proceed.   I discussed the assessment and treatment plan with the patient. The patient was provided an opportunity to ask questions and all were answered. The patient agreed with the plan and demonstrated an understanding of the instructions.   The patient was advised to call back or seek an in-person evaluation if the symptoms worsen or if the condition fails to improve as anticipated.   [facility] MD OP Progress Note  08/01/2018 4:53 PM JAKAI ONOFRE  MRN:  [id number]  Chief Complaint:  Chief Complaint    Follow-up     HPI: Patricia Good is a 58 yr old female, divorced, lives in [place] with her mother, has a history of schizoaffective disorder, PTSD, social anxiety disorder, vitamin B12 deficiency, tobacco use disorder, COPD, psoriasis, history of malignant melanoma, hypothyroidism, osteoarthritis, was evaluated by phone today.  Patient today reports she is having some worsening depressive symptoms.  She struggles with sadness, reduced appetite.  She also reports she has to push herself in the morning to get up and do things around the house.  She reports this has been getting worse since the past couple of months.  She continues to be compliant on her medications as prescribed.  She denies any side effects.  She reports sleep is restless on and off.  She continues to take the Remeron which helps to some extent.  Patient reports she may also have thyroid hormone abnormalities.  She reports  she is currently at her primary medical doctor's office where she is scheduled to have labs done to evaluate for TSH abnormalities.  She does have a history of hypothyroidism.  I have reviewed her labs from 12/21/2017-TSH-low at 0.199.  Patient reports her provider has been trying to readjust her thyroid medications.  Discussed with patient that thyroid hormone abnormalities can also affect her mood as well as can cause other symptoms like appetite changes and so on.  Visit Diagnosis:    ICD-10-CM   1. Schizoaffective disorder, bipolar type ([place]) F25.0 buPROPion (WELLBUTRIN XL) 300 MG 24 hr tablet  2. PTSD (post-traumatic stress disorder) F43.10   3. Social anxiety disorder F40.10   4. Insomnia due to mental condition F51.05   5. Tobacco use disorder F17.200     Past Psychiatric History: I have reviewed past psychiatric history from my progress note on 07/16/2017.  Past trials of Prozac, trazodone.  Past Medical History:  Past Medical History:  Diagnosis Date  . Abnormal thyroid stimulating hormone (TSH) level   . ADHD (attention deficit hyperactivity disorder)   . Anxiety   . Asthma   . PTSD (post-traumatic stress disorder)   . Schizoaffective disorder [facility])     Past Surgical History:  Procedure Laterality Date  . ABDOMINAL HYSTERECTOMY    . ECTOPIC PREGNANCY SURGERY      Family Psychiatric History: Have reviewed family psychiatric history from my progress note on 07/16/2017.  Family History:  Family History  Problem Relation Age of Onset  . Hypertension Father   . Diabetes  Father   . Heart disease Father   . Blindness Father   . Obesity Brother   . Arthritis Brother   . Alcohol abuse Brother   . Drug abuse Brother   . Depression Brother   . Alcohol abuse Sister   . Drug abuse Sister   . Anxiety disorder Sister   . Depression Sister   . Breast cancer Neg Hx     Social History: Reviewed social history from my progress note on 07/16/2017 Social History    Socioeconomic History  . Marital status: Single    Spouse name: Not on file  . Number of children: 0  . Years of education: Not on file  . Highest education level: Associate degree: occupational, Hotel manager, or vocational program  Occupational History  . Not on file  Social Needs  . Financial resource strain: Somewhat hard  . Food insecurity:    Worry: Sometimes true    Inability: Sometimes true  . Transportation needs:    Medical: No    Non-medical: No  Tobacco Use  . Smoking status: Current Some Day Smoker    Packs/day: 0.50    Types: Cigarettes    Start date: 01/22/1969  . Smokeless tobacco: Never Used  Substance and Sexual Activity  . Alcohol use: Yes    Alcohol/week: 1.0 - 2.0 standard drinks    Types: 1 Glasses of wine per week  . Drug use: No  . Sexual activity: Not Currently  Lifestyle  . Physical activity:    Days per week: 0 days    Minutes per session: 0 min  . Stress: Very much  Relationships  . Social connections:    Talks on phone: Never    Gets together: Never    Attends religious service: Never    Active member of club or organization: No    Attends meetings of clubs or organizations: Never    Relationship status: Divorced  Other Topics Concern  . Not on file  Social History Narrative  . Not on file    Allergies:  Allergies  Allergen Reactions  . Shellfish Allergy Anaphylaxis    Only to crab meat (non-imitation), not shrimp  . Sulfa Antibiotics Hives, Anaphylaxis and Other (See Comments)  . Sulfasalazine Anaphylaxis and Hives    Other reaction(s): Other (See Comments)  . Lisinopril     Metabolic Disorder Labs: Lab Results  Component Value Date   HGBA1C 5.7 (H) 12/21/2017   MPG 117 12/21/2017   Lab Results  Component Value Date   PROLACTIN 1.2 (L) 07/12/2017   Lab Results  Component Value Date   CHOL 241 (H) 12/21/2017   TRIG 115 12/21/2017   HDL 55 12/21/2017   CHOLHDL 4.4 12/21/2017   VLDL 23 12/21/2017   LDLCALC 163 (H)  12/21/2017   LDLCALC 174 (H) 07/12/2017   Lab Results  Component Value Date   TSH 0.199 (L) 12/21/2017   TSH 0.465 07/12/2017    Therapeutic Level Labs: No results found for: LITHIUM No results found for: VALPROATE No components found for:  CBMZ  Current Medications: Current Outpatient Medications  Medication Sig Dispense Refill  . albuterol (PROAIR HFA) 108 (90 Base) MCG/ACT inhaler Inhale 1-2 puffs into the lungs every 4 (four) hours as needed for wheezing or shortness of breath.     Marland Kitchen amLODipine (NORVASC) 10 MG tablet Take 10 mg by mouth daily.    . ARIPiprazole (ABILIFY) 30 MG tablet Take 1 tablet (30 mg total) by mouth daily. 90 tablet  0  . B Complex-C (B-COMPLEX WITH VITAMIN C) tablet Take 1 tablet by mouth daily.    Marland Kitchen buPROPion (WELLBUTRIN XL) 300 MG 24 hr tablet Take 1 tablet (300 mg total) by mouth daily. 90 tablet 0  . calcium-vitamin D (OSCAL WITH D) 500-200 MG-UNIT tablet Take 1 tablet by mouth daily with breakfast.    . ferrous sulfate 325 (65 FE) MG tablet Take 325 mg by mouth.    . fluticasone (FLOVENT HFA) 220 MCG/ACT inhaler Inhale 2 puffs 2 (two) times daily into the lungs.    . furosemide (LASIX) 20 MG tablet Take 20 mg by mouth daily.     . hydrOXYzine (ATARAX/VISTARIL) 10 MG tablet Take 1 tablet (10 mg total) by mouth 3 (three) times daily as needed. for anxiety 270 tablet 0  . levothyroxine (SYNTHROID, LEVOTHROID) 112 MCG tablet     . mirtazapine (REMERON) 7.5 MG tablet Take 1 tablet (7.5 mg total) by mouth at bedtime. For sleep 90 tablet 0  . omeprazole (PRILOSEC) 40 MG capsule Take 40 mg by mouth daily.     Marland Kitchen SPIRIVA HANDIHALER 18 MCG inhalation capsule     . valACYclovir (VALTREX) 500 MG tablet Take 500 mg by mouth daily.      No current facility-administered medications for this visit.      Musculoskeletal: Strength & Muscle Tone: UTA Gait & Station: UTA Patient leans: N/A  Psychiatric Specialty Exam: Review of Systems  Psychiatric/Behavioral:  Positive for depression. Negative for hallucinations. The patient has insomnia (some restlessness).   All other systems reviewed and are negative.   There were no vitals taken for this visit.There is no height or weight on file to calculate BMI.  General Appearance: UTA  Eye Contact:  UTA  Speech:  Normal Rate  Volume:  Normal  Mood:  Depressed  Affect:  UTA  Thought Process:  Goal Directed and Descriptions of Associations: Intact  Orientation:  Full (Time, Place, and Person)  Thought Content: Logical   Suicidal Thoughts:  No  Homicidal Thoughts:  No  Memory:  Immediate;   Fair Recent;   Fair Remote;   Fair  Judgement:  Fair  Insight:  Fair  Psychomotor Activity:  UTA  Concentration:  Concentration: Fair and Attention Span: Fair  Recall:  [employer] of Knowledge: Fair  Language: Fair  Akathisia:  No  Handed:  Right  AIMS (if indicated): denies tremors, rigidity  Assets:  Communication Skills Desire for Improvement Housing Transportation  ADL's:  Intact  Cognition: WNL  Sleep:  Restless   Screenings: AUDIT     Admission (Discharged) from 12/22/2017 in [facility]  Alcohol Use Disorder Identification Test Final Score (AUDIT)  1       Assessment and Plan: Patricia Good is a 58 year old Caucasian female, divorced, on SSD, lives in [place], has a history of schizoaffective disorder, social anxiety disorder, PTSD, COPD, hyperlipidemia, vitamin B12 deficiency, psoriasis, hypothyroidism, osteoarthritis was evaluated by phone today.  Patient continues to struggle with depressive symptoms.  Patient will benefit from continued medication management.  Plan as noted below.  Plan Schizoaffective disorder-unstable Abilify 30 mg p.o. daily. Increase Wellbutrin extended release to 300 mg p.o. daily Continue mirtazapine 7.5 mg p.o. nightly.  For PTSD- improving Abilify as prescribed Mirtazapine and Wellbutrin as prescribed Continue hydroxyzine 10 mg p.o. 3 times daily  as needed  For social anxiety disorder-improving Continue psychotherapy sessions.  For insomnia- restless Discussed with patient to continue the mirtazapine for now. She will  monitor her symptoms closely.  For tobacco use disorder-improving She is trying to cut down.  For TSH abnormalities-patient will get her TSH labs checked.  She will continue to follow-up with her primary medical doctor.  Discussed with patient to sign a release to obtain medical records from her PMD.  Follow-up in clinic in 2 weeks or sooner if needed.  I have spent atleast 15 minutes non face to face with patient today. More than 50 % of the time was spent for psychoeducation and supportive psychotherapy and care coordination.  This note was generated in part or whole with voice recognition software. Voice recognition is usually quite accurate but there are transcription errors that can and very often do occur. I apologize for any typographical errors that were not detected and corrected.        Ursula Alert, MD 08/01/2018, 4:53 PM

## 2018-08-01 NOTE — Telephone Encounter (Signed)
pt states that her depression is worse. she doesnt feel like getting up in the morning, or bath and she still not sleeping.

## 2018-08-04 ENCOUNTER — Telehealth: Payer: Self-pay

## 2018-08-04 DIAGNOSIS — G4700 Insomnia, unspecified: Secondary | ICD-10-CM

## 2018-08-04 NOTE — Telephone Encounter (Signed)
pt called left message that she doing some better on the antidepression medication and that she also got thyroid test back and it was normal

## 2018-08-10 NOTE — Telephone Encounter (Signed)
pt called left message that she needs a refill on the doxepin and do away with the sleep medication (it not workin)

## 2018-08-11 ENCOUNTER — Telehealth: Payer: Self-pay

## 2018-08-11 MED ORDER — DOXEPIN HCL 10 MG PO CAPS: 10.0000 mg | ORAL_CAPSULE | Freq: Every evening | ORAL | 0 refills | Status: DC | PRN

## 2018-08-11 NOTE — Telephone Encounter (Signed)
Sent Doxepin per patient request. Stopped remeron.

## 2018-08-11 NOTE — Telephone Encounter (Signed)
Yes , pls ask her to try 2 first and then increase to 3 and let us know.thanks

## 2018-08-11 NOTE — Telephone Encounter (Signed)
pt wants to know if she can increase her doxepin to 3 pills instead of 3.

## 2018-08-15 ENCOUNTER — Ambulatory Visit (INDEPENDENT_AMBULATORY_CARE_PROVIDER_SITE_OTHER): Payer: Medicare Other | Admitting: Psychiatry

## 2018-08-15 ENCOUNTER — Encounter: Payer: Self-pay | Admitting: Psychiatry

## 2018-08-15 ENCOUNTER — Other Ambulatory Visit: Payer: Self-pay

## 2018-08-15 DIAGNOSIS — F4010 Social phobia, unspecified: Secondary | ICD-10-CM | POA: Diagnosis not present

## 2018-08-15 DIAGNOSIS — F17200 Nicotine dependence, unspecified, uncomplicated: Secondary | ICD-10-CM

## 2018-08-15 DIAGNOSIS — G4700 Insomnia, unspecified: Secondary | ICD-10-CM | POA: Diagnosis not present

## 2018-08-15 DIAGNOSIS — F4310 Post-traumatic stress disorder, unspecified: Secondary | ICD-10-CM | POA: Diagnosis not present

## 2018-08-15 DIAGNOSIS — F250 Schizoaffective disorder, bipolar type: Secondary | ICD-10-CM | POA: Diagnosis not present

## 2018-08-15 MED ORDER — DOXEPIN HCL 10 MG PO CAPS: 20.0000 mg | ORAL_CAPSULE | Freq: Every evening | ORAL | 0 refills | Status: DC | PRN

## 2018-08-15 NOTE — Telephone Encounter (Signed)
st she doing much better on the 3  but she will run out of pill because the rx she has says take 1-2 a day

## 2018-08-15 NOTE — Telephone Encounter (Signed)
done

## 2018-08-15 NOTE — Progress Notes (Signed)
Virtual Visit via Telephone Note  I connected with Patricia Good on 08/15/18 at  4:45 PM EDT by telephone and verified that I am speaking with the correct person using two identifiers.   I discussed the limitations, risks, security and privacy concerns of performing an evaluation and management service by telephone and the availability of in person appointments. I also discussed with the patient that there may be a patient responsible charge related to this service. The patient expressed understanding and agreed to proceed.    I discussed the assessment and treatment plan with the patient. The patient was provided an opportunity to ask questions and all were answered. The patient agreed with the plan and demonstrated an understanding of the instructions.   The patient was advised to call back or seek an in-person evaluation if the symptoms worsen or if the condition fails to improve as anticipated.   [facility] MD OP Progress Note  08/15/2018 4:53 PM Patricia Good  MRN:  [id number]  Chief Complaint:  Chief Complaint    Follow-up     HPI: Patricia Good is a 58 year old female, divorced, lives currently in a rooming house, has a history of schizoaffective disorder, PTSD, social anxiety disorder, vitamin B12 deficiency, COPD, tobacco use disorder, psoriasis, history of malignant melanoma, hypothyroidism, osteoarthritis was evaluated by phone today.  Patient was unable to connect by video.  Patient today reports she moved into a rooming house the beginning of the month.  She reports she has to stay there for a year or so tell her [place] gets approved.  She reports so far she likes the place.  She reports she is sleeping better on the doxepin .  She wants more supplies for the doxepin.  She reports she is compliant on her Abilify and Wellbutrin.  Patient denies any suicidality, homicidality or perceptual disturbances.  Patient denies any other concerns today. Visit Diagnosis:    ICD-10-CM   1.  Schizoaffective disorder, bipolar type ([place]) F25.0   2. PTSD (post-traumatic stress disorder) F43.10   3. Social anxiety disorder F40.10   4. Insomnia, unspecified type G47.00 doxepin (SINEQUAN) 10 MG capsule  5. Tobacco use disorder F17.200     Past Psychiatric History: Reviewed past psychiatric history from my progress note on 07/16/2017.  Past trials of Prozac, trazodone.  Past Medical History:  Past Medical History:  Diagnosis Date  . Abnormal thyroid stimulating hormone (TSH) level   . ADHD (attention deficit hyperactivity disorder)   . Anxiety   . Asthma   . PTSD (post-traumatic stress disorder)   . Schizoaffective disorder [facility])     Past Surgical History:  Procedure Laterality Date  . ABDOMINAL HYSTERECTOMY    . ECTOPIC PREGNANCY SURGERY      Family Psychiatric History: Reviewed family psychiatric history from my progress note on 07/16/2017  Family History:  Family History  Problem Relation Age of Onset  . Hypertension Father   . Diabetes Father   . Heart disease Father   . Blindness Father   . Obesity Brother   . Arthritis Brother   . Alcohol abuse Brother   . Drug abuse Brother   . Depression Brother   . Alcohol abuse Sister   . Drug abuse Sister   . Anxiety disorder Sister   . Depression Sister   . Breast cancer Neg Hx     Social History: Reviewed social history from my progress note on 07/16/2017. Social History   Socioeconomic History  . Marital status: Single  Spouse name: Not on file  . Number of children: 0  . Years of education: Not on file  . Highest education level: Associate degree: occupational, Hotel manager, or vocational program  Occupational History  . Not on file  Social Needs  . Financial resource strain: Somewhat hard  . Food insecurity:    Worry: Sometimes true    Inability: Sometimes true  . Transportation needs:    Medical: No    Non-medical: No  Tobacco Use  . Smoking status: Current Some Day Smoker    Packs/day: 0.50     Types: Cigarettes    Start date: 01/22/1969  . Smokeless tobacco: Never Used  Substance and Sexual Activity  . Alcohol use: Yes    Alcohol/week: 1.0 - 2.0 standard drinks    Types: 1 Glasses of wine per week  . Drug use: No  . Sexual activity: Not Currently  Lifestyle  . Physical activity:    Days per week: 0 days    Minutes per session: 0 min  . Stress: Very much  Relationships  . Social connections:    Talks on phone: Never    Gets together: Never    Attends religious service: Never    Active member of club or organization: No    Attends meetings of clubs or organizations: Never    Relationship status: Divorced  Other Topics Concern  . Not on file  Social History Narrative  . Not on file    Allergies:  Allergies  Allergen Reactions  . Shellfish Allergy Anaphylaxis    Only to crab meat (non-imitation), not shrimp  . Sulfa Antibiotics Hives, Anaphylaxis and Other (See Comments)  . Sulfasalazine Anaphylaxis and Hives    Other reaction(s): Other (See Comments)  . Lisinopril     Metabolic Disorder Labs: Lab Results  Component Value Date   HGBA1C 5.7 (H) 12/21/2017   MPG 117 12/21/2017   Lab Results  Component Value Date   PROLACTIN 1.2 (L) 07/12/2017   Lab Results  Component Value Date   CHOL 241 (H) 12/21/2017   TRIG 115 12/21/2017   HDL 55 12/21/2017   CHOLHDL 4.4 12/21/2017   VLDL 23 12/21/2017   LDLCALC 163 (H) 12/21/2017   LDLCALC 174 (H) 07/12/2017   Lab Results  Component Value Date   TSH 0.199 (L) 12/21/2017   TSH 0.465 07/12/2017    Therapeutic Level Labs: No results found for: LITHIUM No results found for: VALPROATE No components found for:  CBMZ  Current Medications: Current Outpatient Medications  Medication Sig Dispense Refill  . albuterol (PROAIR HFA) 108 (90 Base) MCG/ACT inhaler Inhale 1-2 puffs into the lungs every 4 (four) hours as needed for wheezing or shortness of breath.     Marland Kitchen amLODipine (NORVASC) 10 MG tablet Take 10 mg by  mouth daily.    . ARIPiprazole (ABILIFY) 30 MG tablet Take 1 tablet (30 mg total) by mouth daily. 90 tablet 0  . B Complex-C (B-COMPLEX WITH VITAMIN C) tablet Take 1 tablet by mouth daily.    Marland Kitchen buPROPion (WELLBUTRIN XL) 300 MG 24 hr tablet Take 1 tablet (300 mg total) by mouth daily. 90 tablet 0  . calcium-vitamin D (OSCAL WITH D) 500-200 MG-UNIT tablet Take 1 tablet by mouth daily with breakfast.    . doxepin (SINEQUAN) 10 MG capsule Take 2-3 capsules (20-30 mg total) by mouth at bedtime as needed. For sleep 270 capsule 0  . ferrous sulfate 325 (65 FE) MG tablet Take 325 mg by mouth.    Marland Kitchen  fluticasone (FLOVENT HFA) 220 MCG/ACT inhaler Inhale 2 puffs 2 (two) times daily into the lungs.    . furosemide (LASIX) 20 MG tablet Take 20 mg by mouth daily.     . hydrOXYzine (ATARAX/VISTARIL) 10 MG tablet Take 1 tablet (10 mg total) by mouth 3 (three) times daily as needed. for anxiety 270 tablet 0  . levothyroxine (SYNTHROID, LEVOTHROID) 112 MCG tablet     . omeprazole (PRILOSEC) 40 MG capsule Take 40 mg by mouth daily.     Marland Kitchen SPIRIVA HANDIHALER 18 MCG inhalation capsule     . valACYclovir (VALTREX) 500 MG tablet Take 500 mg by mouth daily.      No current facility-administered medications for this visit.      Musculoskeletal: Strength & Muscle Tone: UTA Gait & Station: UTA Patient leans: N/A  Psychiatric Specialty Exam: Review of Systems  Psychiatric/Behavioral: The patient is nervous/anxious.   All other systems reviewed and are negative.   There were no vitals taken for this visit.There is no height or weight on file to calculate BMI.  General Appearance: UTA  Eye Contact:  UTA  Speech:  Clear and Coherent  Volume:  Normal  Mood:  Anxious improving  Affect:  UTA  Thought Process:  Goal Directed and Descriptions of Associations: Intact  Orientation:  Full (Time, Place, and Person)  Thought Content: Logical   Suicidal Thoughts:  No  Homicidal Thoughts:  No  Patricia:  Immediate;    Fair Recent;   Fair Remote;   Fair  Judgement:  Fair  Insight:  Fair  Psychomotor Activity:  UTA  Concentration:  Concentration: Fair and Attention Span: Fair  Recall:  [employer] of Knowledge: Fair  Language: Fair  Akathisia:  No  Handed:  Right  AIMS (if indicated): denies tremors, rigidity,stiffness  Assets:  Communication Skills Desire for Improvement Housing  ADL's:  Intact  Cognition: WNL  Sleep:  improving   Screenings: AUDIT     Admission (Discharged) from 12/22/2017 in [facility]  Alcohol Use Disorder Identification Test Final Score (AUDIT)  1       Assessment and Plan: Hatsuko is a 58 year old Caucasian female, divorced, on SSD, lives in a rooming house, has a history of schizoaffective disorder, social anxiety disorder, PTSD, COPD, hyperlipidemia, vitamin B12 deficiency, psoriasis, hypothyroidism, osteoarthritis was evaluated by phone today.  Patient is currently making progress on the current medication regimen.  Plan as noted below.  Plan Schizoaffective disorder-improving Abilify 30 mg p.o. daily Wellbutrin extended release 300 mg p.o. daily   For PTSD- improving Abilify and Wellbutrin as prescribed. Continue CBT with Ms. Peacock.  Insomnia-improving Increase doxepin to 30 mg p.o. nightly.  For tobacco use disorder-improving She is trying to cut down.  With TSH abnormalities will continue to follow-up with her PMD.  Follow-up in clinic in 4 to 6 weeks or sooner if needed.  Appointment scheduled for June 15 at 11:45 AM.  I have spent atleast 15 minutes non face to face with patient today. More than 50 % of the time was spent for psychoeducation and supportive psychotherapy and care coordination.  This note was generated in part or whole with voice recognition software. Voice recognition is usually quite accurate but there are transcription errors that can and very often do occur. I apologize for any typographical errors that were  not detected and corrected.         Ursula Alert, MD 08/15/2018, 4:53 PM

## 2018-08-15 NOTE — Progress Notes (Signed)
   THERAPIST PROGRESS NOTE  Session Time: 77min  Participation Level: Active  Behavioral Response: CasualAlertEuthymic  Type of Therapy: Individual Therapy  Treatment Goals addressed: Coping  Interventions: CBT and Motivational Interviewing  Summary: Patricia Good is a 58 y.o. female who presents with continued symptoms of her diagnosis.  The patient complained of a lack of communication with her partner.  Role played effective communication techniques.  Suicidal/Homicidal: No  Plan: Return again in 2 weeks.  Diagnosis: Axis I: Schizoaffective Disorder    Axis II: No diagnosis    Lubertha South, LCSW 06/21/2018

## 2018-08-19 NOTE — Progress Notes (Signed)
   THERAPIST PROGRESS NOTE  Session Time: 58min  Participation Level: Active  Behavioral Response: CasualAlertEuthymic  Type of Therapy: Individual Therapy  Treatment Goals addressed: Coping  Interventions: CBT and Motivational Interviewing  Summary: Patricia Good is a 58 y.o. female who presents with continued symptoms of her diagnosis.  Discussion of symptoms and triggers.  Reviewed coping strategies.  Suicidal/Homicidal: No  Plan: Return again in 2 weeks.  Diagnosis: Axis I: Schizoaffective Disorder    Axis II: No diagnosis    Lubertha South, LCSW 07/08/2018

## 2018-09-12 ENCOUNTER — Other Ambulatory Visit: Payer: Self-pay

## 2018-09-12 ENCOUNTER — Ambulatory Visit (INDEPENDENT_AMBULATORY_CARE_PROVIDER_SITE_OTHER): Payer: Medicare Other | Admitting: Psychiatry

## 2018-09-12 ENCOUNTER — Encounter: Payer: Self-pay | Admitting: Psychiatry

## 2018-09-12 DIAGNOSIS — F250 Schizoaffective disorder, bipolar type: Secondary | ICD-10-CM

## 2018-09-12 DIAGNOSIS — F4310 Post-traumatic stress disorder, unspecified: Secondary | ICD-10-CM

## 2018-09-12 DIAGNOSIS — G4700 Insomnia, unspecified: Secondary | ICD-10-CM

## 2018-09-12 DIAGNOSIS — F4010 Social phobia, unspecified: Secondary | ICD-10-CM | POA: Diagnosis not present

## 2018-09-12 DIAGNOSIS — F17200 Nicotine dependence, unspecified, uncomplicated: Secondary | ICD-10-CM

## 2018-09-12 MED ORDER — ARIPIPRAZOLE 30 MG PO TABS: 30.0000 mg | ORAL_TABLET | Freq: Every day | ORAL | 0 refills | Status: DC

## 2018-09-12 NOTE — Progress Notes (Signed)
Virtual Visit via Telephone Note  I connected with Patricia Good on 09/12/18 at 10:00 AM EDT by telephone and verified that I am speaking with the correct person using two identifiers.   I discussed the limitations, risks, security and privacy concerns of performing an evaluation and management service by telephone and the availability of in person appointments. I also discussed with the patient that there may be a patient responsible charge related to this service. The patient expressed understanding and agreed to proceed.    I discussed the assessment and treatment plan with the patient. The patient was provided an opportunity to ask questions and all were answered. The patient agreed with the plan and demonstrated an understanding of the instructions.   The patient was advised to call back or seek an in-person evaluation if the symptoms worsen or if the condition fails to improve as anticipated.    [place] MD OP Progress Note  09/12/2018 10:14 AM [name]  MRN:  [id number]  Chief Complaint:  Chief Complaint    Follow-up     HPI: Patricia Good is a 58 year old female, divorced, lives currently in a rooming house, has a history of schizoaffective disorder, PTSD, social anxiety disorder, vitamin B12 deficiency, COPD, tobacco use disorder, psoriasis, history of malignant melanoma, hypothyroidism, osteoarthritis was evaluated by phone today.  Patient preferred to do a phone call.  Patient today reports she is currently doing well on the current medication regimen.  She denies any significant depressive symptoms or anxiety symptoms.  She denies suicidality.  She reports sleep is good.  She reports appetite is fair.  She reports she remains compliant on all her medications.  She denies any side effects to medications.  Pt continues to cut back on smoking.  She denies any other concerns today. Visit Diagnosis:    ICD-10-CM   1. Schizoaffective disorder, bipolar type ([place]) F25.0 ARIPiprazole  (ABILIFY) 30 MG tablet   in partial remission  2. PTSD (post-traumatic stress disorder) F43.10 ARIPiprazole (ABILIFY) 30 MG tablet  3. Social anxiety disorder F40.10   4. Insomnia, unspecified type G47.00   5. Tobacco use disorder F17.200     Past Psychiatric History: Reviewed past psychiatric history from my progress note on 07/16/2017.  Past trials of Prozac, trazodone.  Past Medical History:  Past Medical History:  Diagnosis Date  . Abnormal thyroid stimulating hormone (TSH) level   . ADHD (attention deficit hyperactivity disorder)   . Anxiety   . Asthma   . PTSD (post-traumatic stress disorder)   . Schizoaffective disorder [facility])     Past Surgical History:  Procedure Laterality Date  . ABDOMINAL HYSTERECTOMY    . ECTOPIC PREGNANCY SURGERY      Family Psychiatric History: Reviewed family psychiatric history from my progress note on 07/16/2017  Family History:  Family History  Problem Relation Age of Onset  . Hypertension Father   . Diabetes Father   . Heart disease Father   . Blindness Father   . Obesity Brother   . Arthritis Brother   . Alcohol abuse Brother   . Drug abuse Brother   . Depression Brother   . Alcohol abuse Sister   . Drug abuse Sister   . Anxiety disorder Sister   . Depression Sister   . Breast cancer Neg Hx     Social History: Reviewed social history from my progress note on 07/16/2017. Social History   Socioeconomic History  . Marital status: Single    Spouse name: Not  on file  . Number of children: 0  . Years of education: Not on file  . Highest education level: Associate degree: occupational, Hotel manager, or vocational program  Occupational History  . Not on file  Social Needs  . Financial resource strain: Somewhat hard  . Food insecurity:    Worry: Sometimes true    Inability: Sometimes true  . Transportation needs:    Medical: No    Non-medical: No  Tobacco Use  . Smoking status: Current Some Day Smoker    Packs/day: 0.50     Types: Cigarettes    Start date: 01/22/1969  . Smokeless tobacco: Never Used  Substance and Sexual Activity  . Alcohol use: Yes    Alcohol/week: 1.0 - 2.0 standard drinks    Types: 1 Glasses of wine per week  . Drug use: No  . Sexual activity: Not Currently  Lifestyle  . Physical activity:    Days per week: 0 days    Minutes per session: 0 min  . Stress: Very much  Relationships  . Social connections:    Talks on phone: Never    Gets together: Never    Attends religious service: Never    Active member of club or organization: No    Attends meetings of clubs or organizations: Never    Relationship status: Divorced  Other Topics Concern  . Not on file  Social History Narrative  . Not on file    Allergies:  Allergies  Allergen Reactions  . Shellfish Allergy Anaphylaxis    Only to crab meat (non-imitation), not shrimp  . Sulfa Antibiotics Hives, Anaphylaxis and Other (See Comments)  . Sulfasalazine Anaphylaxis and Hives    Other reaction(s): Other (See Comments)  . Lisinopril     Metabolic Disorder Labs: Lab Results  Component Value Date   HGBA1C 5.7 (H) 12/21/2017   MPG 117 12/21/2017   Lab Results  Component Value Date   PROLACTIN 1.2 (L) 07/12/2017   Lab Results  Component Value Date   CHOL 241 (H) 12/21/2017   TRIG 115 12/21/2017   HDL 55 12/21/2017   CHOLHDL 4.4 12/21/2017   VLDL 23 12/21/2017   LDLCALC 163 (H) 12/21/2017   LDLCALC 174 (H) 07/12/2017   Lab Results  Component Value Date   TSH 0.199 (L) 12/21/2017   TSH 0.465 07/12/2017    Therapeutic Level Labs: No results found for: LITHIUM No results found for: VALPROATE No components found for:  CBMZ  Current Medications: Current Outpatient Medications  Medication Sig Dispense Refill  . albuterol (PROAIR HFA) 108 (90 Base) MCG/ACT inhaler Inhale 1-2 puffs into the lungs every 4 (four) hours as needed for wheezing or shortness of breath.     Marland Kitchen amLODipine (NORVASC) 10 MG tablet Take 10 mg by  mouth daily.    . ARIPiprazole (ABILIFY) 30 MG tablet Take 1 tablet (30 mg total) by mouth daily. 90 tablet 0  . B Complex-C (B-COMPLEX WITH VITAMIN C) tablet Take 1 tablet by mouth daily.    Marland Kitchen buPROPion (WELLBUTRIN XL) 300 MG 24 hr tablet Take 1 tablet (300 mg total) by mouth daily. 90 tablet 0  . calcium-vitamin D (OSCAL WITH D) 500-200 MG-UNIT tablet Take 1 tablet by mouth daily with breakfast.    . doxepin (SINEQUAN) 10 MG capsule Take 2-3 capsules (20-30 mg total) by mouth at bedtime as needed. For sleep 270 capsule 0  . ferrous sulfate 325 (65 FE) MG tablet Take 325 mg by mouth.    Marland Kitchen  fluticasone (FLOVENT HFA) 220 MCG/ACT inhaler Inhale 2 puffs 2 (two) times daily into the lungs.    . furosemide (LASIX) 20 MG tablet Take 20 mg by mouth daily.     . hydrOXYzine (ATARAX/VISTARIL) 10 MG tablet Take 1 tablet (10 mg total) by mouth 3 (three) times daily as needed. for anxiety 270 tablet 0  . levothyroxine (SYNTHROID, LEVOTHROID) 112 MCG tablet     . omeprazole (PRILOSEC) 40 MG capsule Take 40 mg by mouth daily.     Marland Kitchen SPIRIVA HANDIHALER 18 MCG inhalation capsule     . valACYclovir (VALTREX) 500 MG tablet Take 500 mg by mouth daily.      No current facility-administered medications for this visit.      Musculoskeletal: Strength & Muscle Tone: reports as WNL Gait & Station: Reports as WNL Patient leans: N/A  Psychiatric Specialty Exam: Review of Systems  Psychiatric/Behavioral: Negative for depression. The patient is not nervous/anxious.   All other systems reviewed and are negative.   There were no vitals taken for this visit.There is no height or weight on file to calculate BMI.  General Appearance: UTA  Eye Contact:  UTA  Speech:  Normal Rate  Volume:  Normal  Mood:  Euthymic  Affect:  UTA  Thought Process:  Goal Directed and Descriptions of Associations: Intact  Orientation:  Full (Time, Place, and Person)  Thought Content: Logical   Suicidal Thoughts:  No  Homicidal  Thoughts:  No  Memory:  Immediate;   Fair Recent;   Fair Remote;   Fair  Judgement:  Fair  Insight:  Fair  Psychomotor Activity:  UTA  Concentration:  Concentration: Fair and Attention Span: Fair  Recall:  [employer] of Knowledge: Fair  Language: Fair  Akathisia:  No  Handed:  Right  AIMS (if indicated): Denies tremors, rigidity,stiffness  Assets:  Communication Skills Desire for Improvement Housing Social Support  ADL's:  Intact  Cognition: WNL  Sleep:  Fair   Screenings: AUDIT     Admission (Discharged) from 12/22/2017 in [place]  Alcohol Use Disorder Identification Test Final Score (AUDIT)  1       Assessment and Plan: Patricia Good is a 58 year old Caucasian female, divorced, on SSD, lives in a rooming house, has a history of schizoaffective disorder, social anxiety disorder, PTSD, COPD, hyperlipidemia, vitamin B12 deficiency, psoriasis, hypothyroidism, osteoarthritis was evaluated by phone today.  Patient continues to be compliant with medications and remains stable.  Plan Schizoaffective disorder- in partial remission Abilify 30 mg p.o. daily Wellbutrin extended release 300 mg p.o. daily  For PTSD-stable Abilify and Wellbutrin as prescribed Continue CBT with Ms. Peacock  Insomnia-stable Doxepin 30 mg p.o. nightly  For tobacco use disorder- improving She continues to cut back.  Patient will continue to follow-up with her primary medical doctor for her medical problems including hypothyroidism.  Follow-up in clinic in 6 to 8 weeks or sooner if needed.  July 29 at 2:45 PM  I have spent atleast 15 minutes non face to face with patient today. More than 50 % of the time was spent for psychoeducation and supportive psychotherapy and care coordination.  This note was generated in part or whole with voice recognition software. Voice recognition is usually quite accurate but there are transcription errors that can and very often do occur. I apologize  for any typographical errors that were not detected and corrected.          Ursula Alert, MD 09/12/2018, 10:14 AM

## 2018-09-19 ENCOUNTER — Encounter: Payer: Self-pay | Admitting: Psychiatry

## 2018-09-19 ENCOUNTER — Ambulatory Visit (INDEPENDENT_AMBULATORY_CARE_PROVIDER_SITE_OTHER): Payer: Medicare Other | Admitting: Psychiatry

## 2018-09-19 ENCOUNTER — Other Ambulatory Visit: Payer: Self-pay

## 2018-09-19 DIAGNOSIS — G4700 Insomnia, unspecified: Secondary | ICD-10-CM

## 2018-09-19 DIAGNOSIS — F4310 Post-traumatic stress disorder, unspecified: Secondary | ICD-10-CM

## 2018-09-19 DIAGNOSIS — F17200 Nicotine dependence, unspecified, uncomplicated: Secondary | ICD-10-CM

## 2018-09-19 DIAGNOSIS — F250 Schizoaffective disorder, bipolar type: Secondary | ICD-10-CM | POA: Diagnosis not present

## 2018-09-19 DIAGNOSIS — F4010 Social phobia, unspecified: Secondary | ICD-10-CM

## 2018-09-19 DIAGNOSIS — F5105 Insomnia due to other mental disorder: Secondary | ICD-10-CM | POA: Insufficient documentation

## 2018-09-19 NOTE — Progress Notes (Signed)
Virtual Visit via Video Note  I connected with Patricia Good on 09/19/18 at 11:45 AM EDT by a video enabled telemedicine application and verified that I am speaking with the correct person using two identifiers.   I discussed the limitations of evaluation and management by telemedicine and the availability of in person appointments. The patient expressed understanding and agreed to proceed.     I discussed the assessment and treatment plan with the patient. The patient was provided an opportunity to ask questions and all were answered. The patient agreed with the plan and demonstrated an understanding of the instructions.   The patient was advised to call back or seek an in-person evaluation if the symptoms worsen or if the condition fails to improve as anticipated.  [place] MD OP Progress Note  09/19/2018 12:53 PM Patricia Good  MRN:  [id number]  Chief Complaint:  Chief Complaint    Follow-up     HPI: Arisbel is a 58 year old female, divorced, lives currently in a rooming house, has a history of schizoaffective disorder, PTSD, social anxiety disorder, vitamin B12 deficiency, COPD, tobacco use disorder, psoriasis, history of malignant melanoma, hypothyroidism, osteoarthritis was evaluated by telemedicine today.  Initiated video call with patient however to be changed to a phone call during the session.  Patient reports she continues to struggle with anxiety symptoms.  She reports she is currently living at a rooming house and that is better than where she was living before.  She reports she continues to have relationship struggles with her mother and her sister.  She reports she is hoping she can get into section 8 housing soon.  She is waiting for that.  She reports she has been compliant on her medications as prescribed.  She denies any side effects to the medications.  Discussed with patient about reaching out to her therapist to restart psychotherapy sessions since she continues to struggle  with on and off anxiety and relationship struggles.  Patient agrees with plan.  Patient denies any suicidality, homicidality or perceptual disturbances.  She continues to cut back on smoking.  She denies any other concerns today. Visit Diagnosis:    ICD-10-CM   1. Schizoaffective disorder, bipolar type ([place])  F25.0   2. PTSD (post-traumatic stress disorder)  F43.10   3. Social anxiety disorder  F40.10   4. Insomnia, unspecified type  G47.00   5. Tobacco use disorder  F17.200     Past Psychiatric History: Reviewed past psychiatric history from my progress note on 07/16/2017.  Past trials of Prozac, trazodone.  Past Medical History:  Past Medical History:  Diagnosis Date  . Abnormal thyroid stimulating hormone (TSH) level   . ADHD (attention deficit hyperactivity disorder)   . Anxiety   . Asthma   . PTSD (post-traumatic stress disorder)   . Schizoaffective disorder [facility])     Past Surgical History:  Procedure Laterality Date  . ABDOMINAL HYSTERECTOMY    . ECTOPIC PREGNANCY SURGERY      Family Psychiatric History: I have reviewed family psychiatric history from my progress note on 07/16/2017.  Family History:  Family History  Problem Relation Age of Onset  . Hypertension Father   . Diabetes Father   . Heart disease Father   . Blindness Father   . Obesity Brother   . Arthritis Brother   . Alcohol abuse Brother   . Drug abuse Brother   . Depression Brother   . Alcohol abuse Sister   . Drug abuse Sister   .  Anxiety disorder Sister   . Depression Sister   . Breast cancer Neg Hx     Social History: I have reviewed social history from my progress note on 07/16/2017. Social History   Socioeconomic History  . Marital status: Single    Spouse name: Not on file  . Number of children: 0  . Years of education: Not on file  . Highest education level: Associate degree: occupational, Hotel manager, or vocational program  Occupational History  . Not on file  Social Needs  .  Financial resource strain: Somewhat hard  . Food insecurity    Worry: Sometimes true    Inability: Sometimes true  . Transportation needs    Medical: No    Non-medical: No  Tobacco Use  . Smoking status: Current Some Day Smoker    Packs/day: 0.50    Types: Cigarettes    Start date: 01/22/1969  . Smokeless tobacco: Never Used  Substance and Sexual Activity  . Alcohol use: Yes    Alcohol/week: 1.0 - 2.0 standard drinks    Types: 1 Glasses of wine per week  . Drug use: No  . Sexual activity: Not Currently  Lifestyle  . Physical activity    Days per week: 0 days    Minutes per session: 0 min  . Stress: Very much  Relationships  . Social Herbalist on phone: Never    Gets together: Never    Attends religious service: Never    Active member of club or organization: No    Attends meetings of clubs or organizations: Never    Relationship status: Divorced  Other Topics Concern  . Not on file  Social History Narrative  . Not on file    Allergies:  Allergies  Allergen Reactions  . Shellfish Allergy Anaphylaxis    Only to crab meat (non-imitation), not shrimp  . Sulfa Antibiotics Hives, Anaphylaxis and Other (See Comments)  . Sulfasalazine Anaphylaxis and Hives    Other reaction(s): Other (See Comments)  . Lisinopril     Metabolic Disorder Labs: Lab Results  Component Value Date   HGBA1C 5.7 (H) 12/21/2017   MPG 117 12/21/2017   Lab Results  Component Value Date   PROLACTIN 1.2 (L) 07/12/2017   Lab Results  Component Value Date   CHOL 241 (H) 12/21/2017   TRIG 115 12/21/2017   HDL 55 12/21/2017   CHOLHDL 4.4 12/21/2017   VLDL 23 12/21/2017   LDLCALC 163 (H) 12/21/2017   LDLCALC 174 (H) 07/12/2017   Lab Results  Component Value Date   TSH 0.199 (L) 12/21/2017   TSH 0.465 07/12/2017    Therapeutic Level Labs: No results found for: LITHIUM No results found for: VALPROATE No components found for:  CBMZ  Current Medications: Current Outpatient  Medications  Medication Sig Dispense Refill  . albuterol (PROAIR HFA) 108 (90 Base) MCG/ACT inhaler Inhale 1-2 puffs into the lungs every 4 (four) hours as needed for wheezing or shortness of breath.     Marland Kitchen amLODipine (NORVASC) 10 MG tablet Take 10 mg by mouth daily.    . ARIPiprazole (ABILIFY) 30 MG tablet Take 1 tablet (30 mg total) by mouth daily. 90 tablet 0  . B Complex-C (B-COMPLEX WITH VITAMIN C) tablet Take 1 tablet by mouth daily.    Marland Kitchen buPROPion (WELLBUTRIN XL) 300 MG 24 hr tablet Take 1 tablet (300 mg total) by mouth daily. 90 tablet 0  . calcium-vitamin D (OSCAL WITH D) 500-200 MG-UNIT tablet Take 1  tablet by mouth daily with breakfast.    . doxepin (SINEQUAN) 10 MG capsule Take 2-3 capsules (20-30 mg total) by mouth at bedtime as needed. For sleep 270 capsule 0  . ferrous sulfate 325 (65 FE) MG tablet Take 325 mg by mouth.    . fluticasone (FLOVENT HFA) 220 MCG/ACT inhaler Inhale 2 puffs 2 (two) times daily into the lungs.    . furosemide (LASIX) 20 MG tablet Take 20 mg by mouth daily.     . hydrOXYzine (ATARAX/VISTARIL) 10 MG tablet Take 1 tablet (10 mg total) by mouth 3 (three) times daily as needed. for anxiety 270 tablet 0  . levothyroxine (SYNTHROID, LEVOTHROID) 112 MCG tablet     . omeprazole (PRILOSEC) 40 MG capsule Take 40 mg by mouth daily.     Marland Kitchen SPIRIVA HANDIHALER 18 MCG inhalation capsule     . valACYclovir (VALTREX) 500 MG tablet Take 500 mg by mouth daily.      No current facility-administered medications for this visit.      Musculoskeletal: Strength & Muscle Tone: Reports WNL Gait & Station: normal Patient leans: N/A  Psychiatric Specialty Exam: Review of Systems  Psychiatric/Behavioral: The patient is not nervous/anxious.   All other systems reviewed and are negative.   There were no vitals taken for this visit.There is no height or weight on file to calculate BMI.  General Appearance: UTA  Eye Contact:  UTA  Speech:  Clear and Coherent  Volume:  Normal   Mood:  Anxious  Affect:  UTA  Thought Process:  Goal Directed and Descriptions of Associations: Intact  Orientation:  Full (Time, Place, and Person)  Thought Content: Logical   Suicidal Thoughts:  No  Homicidal Thoughts:  No  Memory:  Immediate;   Fair Recent;   Fair Remote;   Fair  Judgement:  Fair  Insight:  Fair  Psychomotor Activity:  UTA  Concentration:  Concentration: Fair and Attention Span: Fair  Recall:  [employer] of Knowledge: Fair  Language: Fair  Akathisia:  No  Handed:  Right  AIMS (if indicated): Denies tremors rigidity  Assets:  Communication Skills Desire for Improvement Housing Social Support  ADL's:  Intact  Cognition: WNL  Sleep:  Fair   Screenings: AUDIT     Admission (Discharged) from 12/22/2017 in [place]  Alcohol Use Disorder Identification Test Final Score (AUDIT)  1       Assessment and Plan: Tori is a 58 year old Caucasian female, divorced, on SSD, lives in a rooming house, has a history of schizoaffective disorder, social anxiety disorder, PTSD, COPD, hyperlipidemia, vitamin B12 deficiency, psoriasis, hypothyroidism, osteoarthritis was evaluated by the medicine today.  A video call was initiated however had to be changed to a phone call during the session.  Patient continues to struggle with anxiety on and off more so due to psychosocial stressors of her situational stressors like her living situation as well as relationship struggles.  Discussed with patient to start psychotherapy sessions on a more frequent basis.  Continue medications as noted below.  Plan Schizoaffective disorder- in partial remission Abilify 30 mg p.o. daily Wellbutrin extended release 300 mg p.o. daily  For PTSD- stable  Abilify and Wellbutrin as prescribed Continue CBT with Ms. Peacock.  For insomnia-stable Doxepin 30 mg p.o. nightly  For tobacco use disorder-improving She continues to cut back.  Patient with situational stressors  leading to anxiety symptoms will benefit from more frequent psychotherapy session.  Discussed with patient to reach out  to her therapist to schedule an appointment.  Follow-up in clinic in 1 month or sooner if needed.  I have spent atleast 15 minutes non face to face with patient today. More than 50 % of the time was spent for psychoeducation and supportive psychotherapy and care coordination.  This note was generated in part or whole with voice recognition software. Voice recognition is usually quite accurate but there are transcription errors that can and very often do occur. I apologize for any typographical errors that were not detected and corrected.         Ursula Alert, MD 09/19/2018, 12:53 PM

## 2018-10-20 ENCOUNTER — Other Ambulatory Visit: Payer: Self-pay

## 2018-10-20 ENCOUNTER — Ambulatory Visit: Payer: Medicare Other | Admitting: Licensed Clinical Social Worker

## 2018-10-24 ENCOUNTER — Other Ambulatory Visit: Payer: Self-pay | Admitting: Family Medicine

## 2018-10-24 DIAGNOSIS — Z13820 Encounter for screening for osteoporosis: Secondary | ICD-10-CM

## 2018-11-02 ENCOUNTER — Encounter: Payer: Self-pay | Admitting: Psychiatry

## 2018-11-02 ENCOUNTER — Ambulatory Visit (INDEPENDENT_AMBULATORY_CARE_PROVIDER_SITE_OTHER): Payer: Medicare Other | Admitting: Psychiatry

## 2018-11-02 ENCOUNTER — Ambulatory Visit: Payer: Medicare Other | Admitting: Licensed Clinical Social Worker

## 2018-11-02 ENCOUNTER — Other Ambulatory Visit: Payer: Self-pay

## 2018-11-02 DIAGNOSIS — F4010 Social phobia, unspecified: Secondary | ICD-10-CM | POA: Diagnosis not present

## 2018-11-02 DIAGNOSIS — F250 Schizoaffective disorder, bipolar type: Secondary | ICD-10-CM | POA: Diagnosis not present

## 2018-11-02 DIAGNOSIS — G4700 Insomnia, unspecified: Secondary | ICD-10-CM

## 2018-11-02 DIAGNOSIS — F17200 Nicotine dependence, unspecified, uncomplicated: Secondary | ICD-10-CM

## 2018-11-02 DIAGNOSIS — F4310 Post-traumatic stress disorder, unspecified: Secondary | ICD-10-CM

## 2018-11-02 MED ORDER — DOXEPIN HCL 10 MG PO CAPS: 20.0000 mg | ORAL_CAPSULE | Freq: Every evening | ORAL | 0 refills | Status: DC | PRN

## 2018-11-02 MED ORDER — BUPROPION HCL ER (XL) 300 MG PO TB24: 300.0000 mg | ORAL_TABLET | Freq: Every day | ORAL | 0 refills | Status: DC

## 2018-11-02 MED ORDER — HYDROXYZINE HCL 10 MG PO TABS: 10.0000 mg | ORAL_TABLET | Freq: Three times a day (TID) | ORAL | 0 refills | Status: DC | PRN

## 2018-11-02 NOTE — Progress Notes (Signed)
Virtual Visit via Telephone Note  I connected with Patricia Good on 11/02/18 at  2:45 PM EDT by telephone and verified that I am speaking with the correct person using two identifiers.   I discussed the limitations, risks, security and privacy concerns of performing an evaluation and management service by telephone and the availability of in person appointments. I also discussed with the patient that there may be a patient responsible charge related to this service. The patient expressed understanding and agreed to proceed.    I discussed the assessment and treatment plan with the patient. The patient was provided an opportunity to ask questions and all were answered. The patient agreed with the plan and demonstrated an understanding of the instructions.   The patient was advised to call back or seek an in-person evaluation if the symptoms worsen or if the condition fails to improve as anticipated.   [place] MD OP Progress Note  11/02/2018 5:42 PM Patricia Good  MRN:  [id number]  Chief Complaint:  Chief Complaint    Follow-up     HPI: Zehava is a 58 year old female, divorced, lives in a rooming house, has a history of social anxiety disorder, schizoaffective disorder, PTSD, COPD, vitamin B12 deficiency, psoriasis, history of malignant melanoma, hypothyroidism, osteoarthritis was evaluated by phone today.  Patient preferred to do a phone call.  Patient today reports she is going to go to section 8 housing soon.  She reports she is going to move next week.  She is very happy about that.  Patient reports her mood as stable.  She denies any side effects to medications at this time.  She reports sleep is good.  Patient reports she has been cutting back on smoking cigarettes.  Patient denies any suicidality, homicidality or perceptual disturbances.  Patient denies any other concerns today. Visit Diagnosis:    ICD-10-CM   1. Schizoaffective disorder, bipolar type ([place])  F25.0 buPROPion  (WELLBUTRIN XL) 300 MG 24 hr tablet    hydrOXYzine (ATARAX/VISTARIL) 10 MG tablet  2. PTSD (post-traumatic stress disorder)  F43.10 hydrOXYzine (ATARAX/VISTARIL) 10 MG tablet  3. Social anxiety disorder  F40.10   4. Insomnia, unspecified type  G47.00 doxepin (SINEQUAN) 10 MG capsule  5. Tobacco use disorder  F17.200     Past Psychiatric History: I have reviewed past psychiatric history from my progress note on 07/16/2017.  Past trials of Prozac, trazodone.  Past Medical History:  Past Medical History:  Diagnosis Date  . Abnormal thyroid stimulating hormone (TSH) level   . ADHD (attention deficit hyperactivity disorder)   . Anxiety   . Asthma   . PTSD (post-traumatic stress disorder)   . Schizoaffective disorder [facility])     Past Surgical History:  Procedure Laterality Date  . ABDOMINAL HYSTERECTOMY    . ECTOPIC PREGNANCY SURGERY      Family Psychiatric History: I have reviewed family psychiatric history from my progress note on 07/16/2017.  Family History:  Family History  Problem Relation Age of Onset  . Hypertension Father   . Diabetes Father   . Heart disease Father   . Blindness Father   . Obesity Brother   . Arthritis Brother   . Alcohol abuse Brother   . Drug abuse Brother   . Depression Brother   . Alcohol abuse Sister   . Drug abuse Sister   . Anxiety disorder Sister   . Depression Sister   . Breast cancer Neg Hx     Social History: I have reviewed social  history from my progress note on 07/16/2017. Social History   Socioeconomic History  . Marital status: Single    Spouse name: Not on file  . Number of children: 0  . Years of education: Not on file  . Highest education level: Associate degree: occupational, Hotel manager, or vocational program  Occupational History  . Not on file  Social Needs  . Financial resource strain: Somewhat hard  . Food insecurity    Worry: Sometimes true    Inability: Sometimes true  . Transportation needs    Medical: No     Non-medical: No  Tobacco Use  . Smoking status: Current Some Day Smoker    Packs/day: 0.50    Types: Cigarettes    Start date: 01/22/1969  . Smokeless tobacco: Never Used  Substance and Sexual Activity  . Alcohol use: Yes    Alcohol/week: 1.0 - 2.0 standard drinks    Types: 1 Glasses of wine per week  . Drug use: No  . Sexual activity: Not Currently  Lifestyle  . Physical activity    Days per week: 0 days    Minutes per session: 0 min  . Stress: Very much  Relationships  . Social Herbalist on phone: Never    Gets together: Never    Attends religious service: Never    Active member of club or organization: No    Attends meetings of clubs or organizations: Never    Relationship status: Divorced  Other Topics Concern  . Not on file  Social History Narrative  . Not on file    Allergies:  Allergies  Allergen Reactions  . Shellfish Allergy Anaphylaxis    Only to crab meat (non-imitation), not shrimp  . Sulfa Antibiotics Hives, Anaphylaxis and Other (See Comments)  . Sulfasalazine Anaphylaxis and Hives    Other reaction(s): Other (See Comments)  . Lisinopril     Metabolic Disorder Labs: Lab Results  Component Value Date   HGBA1C 5.7 (H) 12/21/2017   MPG 117 12/21/2017   Lab Results  Component Value Date   PROLACTIN 1.2 (L) 07/12/2017   Lab Results  Component Value Date   CHOL 241 (H) 12/21/2017   TRIG 115 12/21/2017   HDL 55 12/21/2017   CHOLHDL 4.4 12/21/2017   VLDL 23 12/21/2017   LDLCALC 163 (H) 12/21/2017   LDLCALC 174 (H) 07/12/2017   Lab Results  Component Value Date   TSH 0.199 (L) 12/21/2017   TSH 0.465 07/12/2017    Therapeutic Level Labs: No results found for: LITHIUM No results found for: VALPROATE No components found for:  CBMZ  Current Medications: Current Outpatient Medications  Medication Sig Dispense Refill  . albuterol (PROAIR HFA) 108 (90 Base) MCG/ACT inhaler Inhale 1-2 puffs into the lungs every 4 (four) hours as  needed for wheezing or shortness of breath.     Marland Kitchen amLODipine (NORVASC) 10 MG tablet Take 10 mg by mouth daily.    . ARIPiprazole (ABILIFY) 30 MG tablet Take 1 tablet (30 mg total) by mouth daily. 90 tablet 0  . B Complex-C (B-COMPLEX WITH VITAMIN C) tablet Take 1 tablet by mouth daily.    . betamethasone dipropionate (DIPROLENE) 0.05 % cream     . buPROPion (WELLBUTRIN XL) 300 MG 24 hr tablet Take 1 tablet (300 mg total) by mouth daily. 90 tablet 0  . calcipotriene (DOVONOX) 0.005 % cream     . calcium-vitamin D (OSCAL WITH D) 500-200 MG-UNIT tablet Take 1 tablet by mouth daily  with breakfast.    . doxepin (SINEQUAN) 10 MG capsule Take 2-3 capsules (20-30 mg total) by mouth at bedtime as needed. For sleep 270 capsule 0  . ferrous sulfate 325 (65 FE) MG tablet Take 325 mg by mouth.    . fluticasone (FLOVENT HFA) 220 MCG/ACT inhaler Inhale 2 puffs 2 (two) times daily into the lungs.    . furosemide (LASIX) 20 MG tablet Take 20 mg by mouth daily.     . hydrOXYzine (ATARAX/VISTARIL) 10 MG tablet Take 1 tablet (10 mg total) by mouth 3 (three) times daily as needed. for anxiety 270 tablet 0  . levothyroxine (SYNTHROID, LEVOTHROID) 112 MCG tablet     . metroNIDAZOLE (FLAGYL) 500 MG tablet     . omeprazole (PRILOSEC) 40 MG capsule Take 40 mg by mouth daily.     Marland Kitchen SPIRIVA HANDIHALER 18 MCG inhalation capsule     . triamcinolone cream (KENALOG) 0.1 %     . valACYclovir (VALTREX) 1000 MG tablet     . valACYclovir (VALTREX) 500 MG tablet Take 500 mg by mouth daily.      No current facility-administered medications for this visit.      Musculoskeletal: Strength & Muscle Tone: UTA Gait & Station: Reports as WNL Patient leans: N/A  Psychiatric Specialty Exam: Review of Systems  Psychiatric/Behavioral: The patient is not nervous/anxious.   All other systems reviewed and are negative.   There were no vitals taken for this visit.There is no height or weight on file to calculate BMI.  General  Appearance: UTA  Eye Contact:  UTA  Speech:  Clear and Coherent  Volume:  Normal  Mood:  Euthymic  Affect:  UTA  Thought Process:  Goal Directed and Descriptions of Associations: Intact  Orientation:  Full (Time, Place, and Person)  Thought Content: Logical   Suicidal Thoughts:  No  Homicidal Thoughts:  No  Memory:  Immediate;   Fair Recent;   Fair Remote;   Fair  Judgement:  Fair  Insight:  Fair  Psychomotor Activity:  UTA  Concentration:  Concentration: Fair and Attention Span: Fair  Recall:  [employer] of Knowledge: Fair  Language: Fair  Akathisia:  No  Handed:  Right  AIMS (if indicated): Denies tremors, rigidity  Assets:  Communication Skills Desire for Improvement Social Support  ADL's:  Intact  Cognition: WNL  Sleep:  Fair   Screenings: AUDIT     Admission (Discharged) from 12/22/2017 in [facility]  Alcohol Use Disorder Identification Test Final Score (AUDIT)  1       Assessment and Plan: Lakeshia is a 58 year old Caucasian female, divorced, on SSD, lives in a rooming house, has a history of schizoaffective disorder, social anxiety disorder, PTSD, COPD, hyperlipidemia, vitamin B12 deficiency, psoriasis, hypothyroidism, osteoarthritis was evaluated by telemedicine today.  Patient preferred to do a phone call.  Patient reports her mood symptoms as stable on the current medication regimen.  Plan For schizoaffective disorder in partial remission Abilify 30 mg p.o. daily Wellbutrin extended release 300 mg p.o. daily  PTSD-stable Abilify and Wellbutrin as prescribed. Continue CBT with Ms. Peacock.  For insomnia-stable Doxepin 30 mg p.o. nightly.  For tobacco use disorder-improving Provided smoking cessation counseling.  Follow-up in clinic in 1 month or sooner if needed.  Appointment scheduled for September 8 at 4:30 PM  I have spent atleast 15 minutes non face to face with patient today. More than 50 % of the time was spent for  psychoeducation and  supportive psychotherapy and care coordination.  This note was generated in part or whole with voice recognition software. Voice recognition is usually quite accurate but there are transcription errors that can and very often do occur. I apologize for any typographical errors that were not detected and corrected.       Ursula Alert, MD 11/02/2018, 5:42 PM

## 2018-11-07 ENCOUNTER — Telehealth: Payer: Self-pay

## 2018-11-07 DIAGNOSIS — Z716 Tobacco abuse counseling: Secondary | ICD-10-CM

## 2018-11-07 MED ORDER — VARENICLINE TARTRATE 0.5 MG PO TABS: 0.5000 mg | ORAL_TABLET | Freq: Every day | ORAL | 0 refills | Status: DC

## 2018-11-07 NOTE — Telephone Encounter (Signed)
pt called and requested a rx for chantix to help her quit smoking.

## 2018-11-07 NOTE — Telephone Encounter (Signed)
Sent chantix to pharmacy - since patient reports she wants to try it. Provided medication education.

## 2018-11-14 ENCOUNTER — Other Ambulatory Visit: Payer: Self-pay | Admitting: Psychiatry

## 2018-11-14 DIAGNOSIS — F250 Schizoaffective disorder, bipolar type: Secondary | ICD-10-CM

## 2018-11-22 ENCOUNTER — Other Ambulatory Visit: Payer: Self-pay | Admitting: Family Medicine

## 2018-11-22 DIAGNOSIS — Z13820 Encounter for screening for osteoporosis: Secondary | ICD-10-CM

## 2018-11-22 DIAGNOSIS — Z1231 Encounter for screening mammogram for malignant neoplasm of breast: Secondary | ICD-10-CM

## 2018-11-25 ENCOUNTER — Other Ambulatory Visit: Payer: Medicare Other

## 2018-12-07 ENCOUNTER — Other Ambulatory Visit: Payer: Self-pay | Admitting: Psychiatry

## 2018-12-07 DIAGNOSIS — Z716 Tobacco abuse counseling: Secondary | ICD-10-CM

## 2018-12-13 ENCOUNTER — Other Ambulatory Visit: Payer: Self-pay

## 2018-12-13 ENCOUNTER — Encounter: Payer: Self-pay | Admitting: Psychiatry

## 2018-12-13 ENCOUNTER — Ambulatory Visit (INDEPENDENT_AMBULATORY_CARE_PROVIDER_SITE_OTHER): Payer: Medicare Other | Admitting: Psychiatry

## 2018-12-13 DIAGNOSIS — F4010 Social phobia, unspecified: Secondary | ICD-10-CM

## 2018-12-13 DIAGNOSIS — F250 Schizoaffective disorder, bipolar type: Secondary | ICD-10-CM

## 2018-12-13 DIAGNOSIS — G4700 Insomnia, unspecified: Secondary | ICD-10-CM | POA: Diagnosis not present

## 2018-12-13 DIAGNOSIS — F4310 Post-traumatic stress disorder, unspecified: Secondary | ICD-10-CM | POA: Diagnosis not present

## 2018-12-13 DIAGNOSIS — F17200 Nicotine dependence, unspecified, uncomplicated: Secondary | ICD-10-CM

## 2018-12-13 MED ORDER — ARIPIPRAZOLE 30 MG PO TABS: 30.0000 mg | ORAL_TABLET | Freq: Every day | ORAL | 1 refills | Status: DC

## 2018-12-13 NOTE — Progress Notes (Signed)
Virtual Visit via Telephone Note  I connected with Patricia Good on 12/13/18 at  4:30 PM EDT by telephone and verified that I am speaking with the correct person using two identifiers.   I discussed the limitations, risks, security and privacy concerns of performing an evaluation and management service by telephone and the availability of in person appointments. I also discussed with the patient that there may be a patient responsible charge related to this service. The patient expressed understanding and agreed to proceed.    I discussed the assessment and treatment plan with the patient. The patient was provided an opportunity to ask questions and all were answered. The patient agreed with the plan and demonstrated an understanding of the instructions.   The patient was advised to call back or seek an in-person evaluation if the symptoms worsen or if the condition fails to improve as anticipated.   [place] MD OP Progress Note  12/13/2018 5:07 PM Patricia Good  MRN:  [id number]  Chief Complaint:  Chief Complaint    Follow-up     HPI: Patricia Good is a 58 year old female, divorced, lives in a rooming house, has a history of social anxiety disorder, schizoaffective disorder, PTSD, COPD, vitamin B12 deficiency, psoriasis, history of malignant melanoma, hypothyroidism, osteoarthritis was evaluated by phone today.  Patient today reports she is going to move to section 8 housing tomorrow.  A friend is going to help her move her belongings.  She is excited about the move.  She reports her mood is currently stable on current medications.  She denies any side effects at this time.  She reports sleep and appetite is fair.  She reports she has been cutting back on smoking cigarettes.  She has been making some progress.  She reports she does not have any current suicidality, homicidality or perceptual disturbances.  Patient denies any other concerns today.   Visit Diagnosis:    ICD-10-CM   1.  Schizoaffective disorder, bipolar type ([place])  F25.0 ARIPiprazole (ABILIFY) 30 MG tablet  2. PTSD (post-traumatic stress disorder)  F43.10 ARIPiprazole (ABILIFY) 30 MG tablet  3. Social anxiety disorder  F40.10   4. Insomnia, unspecified type  G47.00   5. Tobacco use disorder  F17.200   6. Schizoaffective disorder, bipolar type ([place])  F25.0 ARIPiprazole (ABILIFY) 30 MG tablet   in partial remission    Past Psychiatric History: I have reviewed past psychiatric history from my progress note on 07/16/2017.  Past trials of Prozac, trazodone. Past Medical History:  Past Medical History:  Diagnosis Date  . Abnormal thyroid stimulating hormone (TSH) level   . ADHD (attention deficit hyperactivity disorder)   . Anxiety   . Asthma   . PTSD (post-traumatic stress disorder)   . Schizoaffective disorder [facility])     Past Surgical History:  Procedure Laterality Date  . ABDOMINAL HYSTERECTOMY    . ECTOPIC PREGNANCY SURGERY      Family Psychiatric History: I have reviewed family psychiatric history from my progress note on 07/16/2017.  Family History:  Family History  Problem Relation Age of Onset  . Hypertension Father   . Diabetes Father   . Heart disease Father   . Blindness Father   . Obesity Brother   . Arthritis Brother   . Alcohol abuse Brother   . Drug abuse Brother   . Depression Brother   . Alcohol abuse Sister   . Drug abuse Sister   . Anxiety disorder Sister   . Depression Sister   .  Breast cancer Neg Hx     Social History: I have reviewed social history from my progress note on 07/16/2017. Social History   Socioeconomic History  . Marital status: Single    Spouse name: Not on file  . Number of children: 0  . Years of education: Not on file  . Highest education level: Associate degree: occupational, Hotel manager, or vocational program  Occupational History  . Not on file  Social Needs  . Financial resource strain: Somewhat hard  . Food insecurity    Worry: Sometimes  true    Inability: Sometimes true  . Transportation needs    Medical: No    Non-medical: No  Tobacco Use  . Smoking status: Current Some Day Smoker    Packs/day: 0.50    Types: Cigarettes    Start date: 01/22/1969  . Smokeless tobacco: Never Used  Substance and Sexual Activity  . Alcohol use: Yes    Alcohol/week: 1.0 - 2.0 standard drinks    Types: 1 Glasses of wine per week  . Drug use: No  . Sexual activity: Not Currently  Lifestyle  . Physical activity    Days per week: 0 days    Minutes per session: 0 min  . Stress: Very much  Relationships  . Social Herbalist on phone: Never    Gets together: Never    Attends religious service: Never    Active member of club or organization: No    Attends meetings of clubs or organizations: Never    Relationship status: Divorced  Other Topics Concern  . Not on file  Social History Narrative  . Not on file    Allergies:  Allergies  Allergen Reactions  . Shellfish Allergy Anaphylaxis    Only to crab meat (non-imitation), not shrimp  . Sulfa Antibiotics Hives, Anaphylaxis and Other (See Comments)  . Sulfasalazine Anaphylaxis and Hives    Other reaction(s): Other (See Comments)  . Lisinopril     Metabolic Disorder Labs: Lab Results  Component Value Date   HGBA1C 5.7 (H) 12/21/2017   MPG 117 12/21/2017   Lab Results  Component Value Date   PROLACTIN 1.2 (L) 07/12/2017   Lab Results  Component Value Date   CHOL 241 (H) 12/21/2017   TRIG 115 12/21/2017   HDL 55 12/21/2017   CHOLHDL 4.4 12/21/2017   VLDL 23 12/21/2017   LDLCALC 163 (H) 12/21/2017   LDLCALC 174 (H) 07/12/2017   Lab Results  Component Value Date   TSH 0.199 (L) 12/21/2017   TSH 0.465 07/12/2017    Therapeutic Level Labs: No results found for: LITHIUM No results found for: VALPROATE No components found for:  CBMZ  Current Medications: Current Outpatient Medications  Medication Sig Dispense Refill  . albuterol (PROAIR HFA) 108 (90  Base) MCG/ACT inhaler Inhale 1-2 puffs into the lungs every 4 (four) hours as needed for wheezing or shortness of breath.     Marland Kitchen amLODipine (NORVASC) 10 MG tablet Take 10 mg by mouth daily.    . ARIPiprazole (ABILIFY) 30 MG tablet Take 1 tablet (30 mg total) by mouth daily. 90 tablet 1  . B Complex-C (B-COMPLEX WITH VITAMIN C) tablet Take 1 tablet by mouth daily.    . betamethasone dipropionate (DIPROLENE) 0.05 % cream     . buPROPion (WELLBUTRIN XL) 300 MG 24 hr tablet Take 1 tablet (300 mg total) by mouth daily. 90 tablet 0  . calcipotriene (DOVONOX) 0.005 % cream     .  calcium-vitamin D (OSCAL WITH D) 500-200 MG-UNIT tablet Take 1 tablet by mouth daily with breakfast.    . CHANTIX 0.5 MG tablet TAKE 1 TABLET BY MOUTH DAILY 30 tablet 0  . doxepin (SINEQUAN) 10 MG capsule Take 2-3 capsules (20-30 mg total) by mouth at bedtime as needed. For sleep 270 capsule 0  . ferrous sulfate 325 (65 FE) MG tablet Take 325 mg by mouth.    . fluticasone (FLOVENT HFA) 220 MCG/ACT inhaler Inhale 2 puffs 2 (two) times daily into the lungs.    . furosemide (LASIX) 20 MG tablet Take 20 mg by mouth daily.     . hydrOXYzine (ATARAX/VISTARIL) 10 MG tablet Take 1 tablet (10 mg total) by mouth 3 (three) times daily as needed. for anxiety 270 tablet 0  . levothyroxine (SYNTHROID, LEVOTHROID) 112 MCG tablet     . metroNIDAZOLE (FLAGYL) 500 MG tablet     . omeprazole (PRILOSEC) 40 MG capsule Take 40 mg by mouth daily.     Marland Kitchen OTEZLA 10 & 20 & 30 MG TBPK     . SPIRIVA HANDIHALER 18 MCG inhalation capsule     . triamcinolone cream (KENALOG) 0.1 %     . valACYclovir (VALTREX) 1000 MG tablet     . valACYclovir (VALTREX) 500 MG tablet Take 500 mg by mouth daily.     Marland Kitchen XIIDRA 5 % SOLN      No current facility-administered medications for this visit.      Musculoskeletal: Strength & Muscle Tone: UTA Gait & Station: Reports as WNL Patient leans: N/A  Psychiatric Specialty Exam: Review of Systems   Psychiatric/Behavioral: Negative for depression, hallucinations, substance abuse and suicidal ideas. The patient is not nervous/anxious.   All other systems reviewed and are negative.   There were no vitals taken for this visit.There is no height or weight on file to calculate BMI.  General Appearance: UTA  Eye Contact:  UTA  Speech:  Clear and Coherent  Volume:  Normal  Mood:  Euthymic  Affect:  UTA  Thought Process:  Goal Directed and Descriptions of Associations: Intact  Orientation:  Full (Time, Place, and Person)  Thought Content: Logical   Suicidal Thoughts:  No  Homicidal Thoughts:  No  Memory:  Immediate;   Fair Recent;   Fair Remote;   Fair  Judgement:  Fair  Insight:  Fair  Psychomotor Activity:  UTA  Concentration:  Concentration: Fair and Attention Span: Fair  Recall:  [employer] of Knowledge: Fair  Language: Fair  Akathisia:  No  Handed:  Right  AIMS (if indicated): Denies tremors, rigidity  Assets:  Communication Skills Desire for Improvement Housing Social Support  ADL's:  Intact  Cognition: WNL  Sleep:  Fair   Screenings: AUDIT     Admission (Discharged) from 12/22/2017 in [place]  Alcohol Use Disorder Identification Test Final Score (AUDIT)  1       Assessment and Plan: Hadiya is a 58 year old Caucasian female, divorced, on SSD, lives in a rooming house, has a history of schizoaffective disorder, social anxiety disorder, PTSD, COPD, hyperlipidemia, vitamin B12 deficiency, psoriasis, hypothyroidism, osteoarthritis was evaluated by telemedicine today.  Patient preferred to do a phone call.  Patient is currently making progress on current medication regimen.  Plan as noted below.   Plan Schizoaffective disorder in partial remission Abilify 30 mg p.o. daily Wellbutrin extended release 300 mg p.o. daily  PTSD-stable Abilify and Wellbutrin as prescribed Continue CBT with Ms. [employer]  Insomnia-stable Doxepin 30 mg p.o.  nightly  Tobacco use disorder- improving Provided smoking cessation counseling.  Follow-up in clinic in 2 months or sooner if needed.  November 3 at 2:45 PM  I have spent atleast 15 minutes non  face to face with patient today. More than 50 % of the time was spent for psychoeducation and supportive psychotherapy and care coordination. This note was generated in part or whole with voice recognition software. Voice recognition is usually quite accurate but there are transcription errors that can and very often do occur. I apologize for any typographical errors that were not detected and corrected.      Ursula Alert, MD 12/13/2018, 5:07 PM

## 2018-12-27 ENCOUNTER — Other Ambulatory Visit: Payer: Medicare Other

## 2019-01-04 ENCOUNTER — Other Ambulatory Visit: Payer: Self-pay | Admitting: Psychiatry

## 2019-01-04 DIAGNOSIS — Z716 Tobacco abuse counseling: Secondary | ICD-10-CM

## 2019-01-05 ENCOUNTER — Telehealth: Payer: Self-pay

## 2019-01-05 NOTE — Telephone Encounter (Signed)
pt called states chantix is working. she is down to 2 a day now.

## 2019-01-05 NOTE — Telephone Encounter (Signed)
Great . It was just refilled.

## 2019-01-12 ENCOUNTER — Telehealth: Payer: Self-pay

## 2019-01-12 DIAGNOSIS — Z716 Tobacco abuse counseling: Secondary | ICD-10-CM

## 2019-01-12 MED ORDER — VARENICLINE TARTRATE 0.5 MG PO TABS: 0.5000 mg | ORAL_TABLET | Freq: Every day | ORAL | 2 refills | Status: DC

## 2019-01-12 NOTE — Telephone Encounter (Signed)
Sent chantix refills

## 2019-01-12 NOTE — Telephone Encounter (Signed)
  Pt called states she needs a refill on her chantix  CHANTIX 0.5 MG tablet Medication Date: 01/04/2019 Department: [facility] Ordering/Authorizing: Ursula Alert, MD  Order Providers  Prescribing Provider Encounter Provider  Ursula Alert, MD Ursula Alert, MD  Outpatient Medication Detail   Disp Refills Start End   CHANTIX 0.5 MG tablet 30 tablet 0 01/04/2019    Sig: TAKE 1 TABLET BY MOUTH DAILY   Sent to pharmacy as: CHANTIX 0.5 MG tablet   E-Prescribing Status: Receipt confirmed by pharmacy (01/04/2019 3:50 PM EDT)   Associated Diagnoses  Encounter for smoking cessation counseling

## 2019-01-24 ENCOUNTER — Other Ambulatory Visit: Payer: Self-pay | Admitting: Family Medicine

## 2019-01-24 DIAGNOSIS — R748 Abnormal levels of other serum enzymes: Secondary | ICD-10-CM

## 2019-01-31 ENCOUNTER — Other Ambulatory Visit: Payer: Medicare Other

## 2019-02-07 ENCOUNTER — Encounter: Payer: Self-pay | Admitting: Psychiatry

## 2019-02-07 ENCOUNTER — Ambulatory Visit (INDEPENDENT_AMBULATORY_CARE_PROVIDER_SITE_OTHER): Payer: 59 | Admitting: Psychiatry

## 2019-02-07 ENCOUNTER — Other Ambulatory Visit: Payer: Self-pay

## 2019-02-07 DIAGNOSIS — F4010 Social phobia, unspecified: Secondary | ICD-10-CM

## 2019-02-07 DIAGNOSIS — F4310 Post-traumatic stress disorder, unspecified: Secondary | ICD-10-CM

## 2019-02-07 DIAGNOSIS — F259 Schizoaffective disorder, unspecified: Secondary | ICD-10-CM

## 2019-02-07 DIAGNOSIS — G4700 Insomnia, unspecified: Secondary | ICD-10-CM | POA: Diagnosis not present

## 2019-02-07 DIAGNOSIS — F17200 Nicotine dependence, unspecified, uncomplicated: Secondary | ICD-10-CM

## 2019-02-07 MED ORDER — BUPROPION HCL ER (XL) 300 MG PO TB24: 300.0000 mg | ORAL_TABLET | Freq: Every day | ORAL | 1 refills | Status: DC

## 2019-02-07 NOTE — Progress Notes (Signed)
Virtual Visit via Telephone Note  I connected with Patricia Good on 02/07/19 at  2:45 PM EST by telephone and verified that I am speaking with the correct person using two identifiers.   I discussed the limitations, risks, security and privacy concerns of performing an evaluation and management service by telephone and the availability of in person appointments. I also discussed with the patient that there may be a patient responsible charge related to this service. The patient expressed understanding and agreed to proceed.     I discussed the assessment and treatment plan with the patient. The patient was provided an opportunity to ask questions and all were answered. The patient agreed with the plan and demonstrated an understanding of the instructions.   The patient was advised to call back or seek an in-person evaluation if the symptoms worsen or if the condition fails to improve as anticipated.  Russell MD OP Progress Note  02/07/2019 5:21 PM MCKENZE NISSLEY  MRN:  [id number]  Chief Complaint:  Chief Complaint    Follow-up     HPI: Patricia Good is a 58 year old female, divorced, lives in [place], has a history of schizoaffective disorder, social anxiety disorder, PTSD, COPD, vitamin B12 deficiency, psoriasis, history of malignant melanoma, hypothyroidism, osteoarthritis was evaluated by phone today.  Patient preferred to do a phone call.  She reports she is currently doing well except for having a UTI.  She is taking ciprofloxacin for the same.  She reports her mood is stable.  She denies any sleep problems.  She reports she currently does not have any perceptual disturbances.  Patient reports she has been able to cut back on smoking cigarettes and continues to take Chantix.  She currently uses 1 cigarette every 2 or 3 days.  That is a good improvement for her.  Patient reports she is interested in starting psychotherapy sessions again.  Advised patient to make an appointment with Ms.  Peacock.   Visit Diagnosis:    ICD-10-CM   1. Schizoaffective disorder, in remission ([place])  F25.9 buPROPion (WELLBUTRIN XL) 300 MG 24 hr tablet  2. PTSD (post-traumatic stress disorder)  F43.10    stable  3. Social anxiety disorder  F40.10    stable  4. Insomnia, unspecified type  G47.00    stable  5. Tobacco use disorder  F17.200    improving    Past Psychiatric History: I have reviewed past psychiatric history from my progress note on 07/16/2017.  Past trials of Prozac, trazodone  Past Medical History:  Past Medical History:  Diagnosis Date  . Abnormal thyroid stimulating hormone (TSH) level   . ADHD (attention deficit hyperactivity disorder)   . Anxiety   . Asthma   . PTSD (post-traumatic stress disorder)   . Schizoaffective disorder [facility])     Past Surgical History:  Procedure Laterality Date  . ABDOMINAL HYSTERECTOMY    . ECTOPIC PREGNANCY SURGERY      Family Psychiatric History: Reviewed family psychiatric history from my progress note on 07/16/2017 Family History:  Family History  Problem Relation Age of Onset  . Hypertension Father   . Diabetes Father   . Heart disease Father   . Blindness Father   . Obesity Brother   . Arthritis Brother   . Alcohol abuse Brother   . Drug abuse Brother   . Depression Brother   . Alcohol abuse Sister   . Drug abuse Sister   . Anxiety disorder Sister   . Depression Sister   .  Breast cancer Neg Hx     Social History: Reviewed social history from my progress note on 07/16/2017 Social History   Socioeconomic History  . Marital status: Single    Spouse name: Not on file  . Number of children: 0  . Years of education: Not on file  . Highest education level: Associate degree: occupational, Hotel manager, or vocational program  Occupational History  . Not on file  Social Needs  . Financial resource strain: Somewhat hard  . Food insecurity    Worry: Sometimes true    Inability: Sometimes true  . Transportation needs     Medical: No    Non-medical: No  Tobacco Use  . Smoking status: Current Some Day Smoker    Packs/day: 0.25    Types: Cigarettes    Start date: 01/22/1969  . Smokeless tobacco: Never Used  . Tobacco comment: currently 1 cig every two days or so   Substance and Sexual Activity  . Alcohol use: Yes    Alcohol/week: 1.0 - 2.0 standard drinks    Types: 1 Glasses of wine per week  . Drug use: No  . Sexual activity: Not Currently  Lifestyle  . Physical activity    Days per week: 0 days    Minutes per session: 0 min  . Stress: Very much  Relationships  . Social Herbalist on phone: Never    Gets together: Never    Attends religious service: Never    Active member of club or organization: No    Attends meetings of clubs or organizations: Never    Relationship status: Divorced  Other Topics Concern  . Not on file  Social History Narrative  . Not on file    Allergies:  Allergies  Allergen Reactions  . Shellfish Allergy Anaphylaxis    Only to crab meat (non-imitation), not shrimp  . Sulfa Antibiotics Hives, Anaphylaxis and Other (See Comments)  . Sulfasalazine Anaphylaxis and Hives    Other reaction(s): Other (See Comments)  . Lisinopril     Metabolic Disorder Labs: Lab Results  Component Value Date   HGBA1C 5.7 (H) 12/21/2017   MPG 117 12/21/2017   Lab Results  Component Value Date   PROLACTIN 1.2 (L) 07/12/2017   Lab Results  Component Value Date   CHOL 241 (H) 12/21/2017   TRIG 115 12/21/2017   HDL 55 12/21/2017   CHOLHDL 4.4 12/21/2017   VLDL 23 12/21/2017   LDLCALC 163 (H) 12/21/2017   LDLCALC 174 (H) 07/12/2017   Lab Results  Component Value Date   TSH 0.199 (L) 12/21/2017   TSH 0.465 07/12/2017    Therapeutic Level Labs: No results found for: LITHIUM No results found for: VALPROATE No components found for:  CBMZ  Current Medications: Current Outpatient Medications  Medication Sig Dispense Refill  . albuterol (PROAIR HFA) 108 (90 Base)  MCG/ACT inhaler Inhale 1-2 puffs into the lungs every 4 (four) hours as needed for wheezing or shortness of breath.     Marland Kitchen amLODipine (NORVASC) 10 MG tablet Take 10 mg by mouth daily.    . ARIPiprazole (ABILIFY) 30 MG tablet Take 1 tablet (30 mg total) by mouth daily. 90 tablet 1  . B Complex-C (B-COMPLEX WITH VITAMIN C) tablet Take 1 tablet by mouth daily.    . betamethasone dipropionate (DIPROLENE) 0.05 % cream     . buPROPion (WELLBUTRIN XL) 300 MG 24 hr tablet Take 1 tablet (300 mg total) by mouth daily. 90 tablet 1  .  calcipotriene (DOVONOX) 0.005 % cream     . calcium-vitamin D (OSCAL WITH D) 500-200 MG-UNIT tablet Take 1 tablet by mouth daily with breakfast.    . ciprofloxacin (CIPRO) 500 MG tablet Take 500 mg by mouth 2 (two) times daily.    Marland Kitchen doxepin (SINEQUAN) 10 MG capsule Take 2-3 capsules (20-30 mg total) by mouth at bedtime as needed. For sleep 270 capsule 0  . ferrous sulfate 325 (65 FE) MG tablet Take 325 mg by mouth.    . fluticasone (FLOVENT HFA) 220 MCG/ACT inhaler Inhale 2 puffs 2 (two) times daily into the lungs.    . furosemide (LASIX) 20 MG tablet Take 20 mg by mouth daily.     . hydrOXYzine (ATARAX/VISTARIL) 10 MG tablet Take 1 tablet (10 mg total) by mouth 3 (three) times daily as needed. for anxiety 270 tablet 0  . levothyroxine (SYNTHROID, LEVOTHROID) 112 MCG tablet     . metroNIDAZOLE (FLAGYL) 500 MG tablet     . omeprazole (PRILOSEC) 40 MG capsule Take 40 mg by mouth daily.     Marland Kitchen OTEZLA 10 & 20 & 30 MG TBPK     . SPIRIVA HANDIHALER 18 MCG inhalation capsule     . triamcinolone cream (KENALOG) 0.1 %     . valACYclovir (VALTREX) 1000 MG tablet     . valACYclovir (VALTREX) 500 MG tablet Take 500 mg by mouth daily.     . varenicline (CHANTIX) 0.5 MG tablet Take 1 tablet (0.5 mg total) by mouth daily. 30 tablet 2  . XIIDRA 5 % SOLN      No current facility-administered medications for this visit.      Musculoskeletal: Strength & Muscle Tone: UTA Gait & Station:  Reports as WNL Patient leans: N/A  Psychiatric Specialty Exam: Review of Systems  Genitourinary: Positive for dysuria and urgency.  Psychiatric/Behavioral: Negative for depression, substance abuse and suicidal ideas. The patient is not nervous/anxious and does not have insomnia.   All other systems reviewed and are negative.   There were no vitals taken for this visit.There is no height or weight on file to calculate BMI.  General Appearance: UTA  Eye Contact:  UTA  Speech:  Clear and Coherent  Volume:  Normal  Mood:  Euthymic  Affect:  UTA  Thought Process:  Goal Directed and Descriptions of Associations: Intact  Orientation:  Full (Time, Place, and Person)  Thought Content: Logical   Suicidal Thoughts:  No  Homicidal Thoughts:  No  Memory:  Immediate;   Fair Recent;   Fair Remote;   Fair  Judgement:  Fair  Insight:  Fair  Psychomotor Activity:  UTA  Concentration:  Concentration: Fair and Attention Span: Fair  Recall:  [employer] of Knowledge: Fair  Language: Fair  Akathisia:  No  Handed:  Right  AIMS (if indicated): Denies tremors, rigidity  Assets:  Communication Skills Desire for Improvement Social Support  ADL's:  Intact  Cognition: WNL  Sleep:  Fair   Screenings: AUDIT     Admission (Discharged) from 12/22/2017 in [facility]  Alcohol Use Disorder Identification Test Final Score (AUDIT)  1       Assessment and Plan: Kinlei is a 58 year old Caucasian female, divorced, on SSD, lives in [place], has a history of schizoaffective disorder, social anxiety disorder, PTSD, COPD, hyperlipidemia, vitamin B12 deficiency, psoriasis, recent UTI, hypothyroidism, osteoarthritis was evaluated by telemedicine today.  Patient is currently doing well on the current medication regimen.  Plan Schizoaffective  disorder in partial remission Abilify 30 mg p.o. daily Wellbutrin extended release 300 mg p.o. daily  PTSD-stable Abilify and Wellbutrin as  prescribed Continue CBT with Ms. Peacock  Insomnia-stable Doxepin 30 mg p.o. nightly  Tobacco use disorder-improving Patient is on Chantix. She is currently cutting back.  Patient will continue to follow up with PMD for her UTI.  Follow-up in clinic in 2 to 3 months or sooner if needed.  January 28 at 2:30 PM  I have spent atleast 15 minutes non face to face with patient today. More than 50 % of the time was spent for psychoeducation and supportive psychotherapy and care coordination. This note was generated in part or whole with voice recognition software. Voice recognition is usually quite accurate but there are transcription errors that can and very often do occur. I apologize for any typographical errors that were not detected and corrected.       Ursula Alert, MD 02/07/2019, 5:21 PM

## 2019-02-14 ENCOUNTER — Other Ambulatory Visit: Payer: Medicare Other

## 2019-02-14 ENCOUNTER — Inpatient Hospital Stay: Admission: RE | Admit: 2019-02-14 | Payer: Medicare Other | Source: Ambulatory Visit

## 2019-02-14 ENCOUNTER — Ambulatory Visit: Payer: 59 | Attending: Family Medicine

## 2019-02-16 ENCOUNTER — Other Ambulatory Visit: Payer: Self-pay

## 2019-02-16 ENCOUNTER — Ambulatory Visit (INDEPENDENT_AMBULATORY_CARE_PROVIDER_SITE_OTHER): Payer: Medicaid Other | Admitting: Licensed Clinical Social Worker

## 2019-02-16 DIAGNOSIS — F259 Schizoaffective disorder, unspecified: Secondary | ICD-10-CM

## 2019-02-19 NOTE — Progress Notes (Signed)
No contact.

## 2019-04-04 ENCOUNTER — Telehealth: Payer: Self-pay

## 2019-04-04 DIAGNOSIS — F4010 Social phobia, unspecified: Secondary | ICD-10-CM

## 2019-04-04 DIAGNOSIS — F259 Schizoaffective disorder, unspecified: Secondary | ICD-10-CM | POA: Insufficient documentation

## 2019-04-04 DIAGNOSIS — F4310 Post-traumatic stress disorder, unspecified: Secondary | ICD-10-CM

## 2019-04-04 DIAGNOSIS — G4700 Insomnia, unspecified: Secondary | ICD-10-CM

## 2019-04-04 MED ORDER — ZOLPIDEM TARTRATE 5 MG PO TABS: 5.0000 mg | ORAL_TABLET | Freq: Every evening | ORAL | 0 refills | Status: DC | PRN

## 2019-04-04 NOTE — Telephone Encounter (Signed)
Patient reports she is not sleeping.  Doxepin does not help anymore.  Discontinue doxepin.  Start Ambien 5 mg p.o. nightly.  Provided medication education.

## 2019-04-04 NOTE — Telephone Encounter (Signed)
Pt called she states she still not sleeping.

## 2019-04-12 ENCOUNTER — Telehealth: Payer: Self-pay

## 2019-04-12 NOTE — Telephone Encounter (Signed)
Returned call to patient.  Discussed with her to take the Ambien 2.5 mg at bedtime since she reports it is too strong for her.  She will give it a try and let writer know if it does not work.

## 2019-04-12 NOTE — Telephone Encounter (Signed)
pt states that the Patricia Good is too strong.

## 2019-04-13 ENCOUNTER — Telehealth: Payer: Self-pay

## 2019-04-13 NOTE — Telephone Encounter (Signed)
Please ask her to stop it if she wants to.

## 2019-04-13 NOTE — Telephone Encounter (Signed)
pt wants to stop the chantix.

## 2019-04-15 ENCOUNTER — Other Ambulatory Visit: Payer: Self-pay

## 2019-04-15 ENCOUNTER — Emergency Department
Admission: EM | Admit: 2019-04-15 | Discharge: 2019-04-15 | Disposition: A | Discharge status: Home / Self Care | Payer: Medicare Other | Attending: Student | Admitting: Student

## 2019-04-15 DIAGNOSIS — N3001 Acute cystitis with hematuria: Secondary | ICD-10-CM | POA: Diagnosis not present

## 2019-04-15 DIAGNOSIS — E876 Hypokalemia: Secondary | ICD-10-CM

## 2019-04-15 DIAGNOSIS — Z79899 Other long term (current) drug therapy: Secondary | ICD-10-CM | POA: Insufficient documentation

## 2019-04-15 DIAGNOSIS — J45909 Unspecified asthma, uncomplicated: Secondary | ICD-10-CM | POA: Diagnosis not present

## 2019-04-15 DIAGNOSIS — I10 Essential (primary) hypertension: Secondary | ICD-10-CM | POA: Diagnosis not present

## 2019-04-15 DIAGNOSIS — F17210 Nicotine dependence, cigarettes, uncomplicated: Secondary | ICD-10-CM | POA: Diagnosis not present

## 2019-04-15 DIAGNOSIS — M545 Low back pain: Secondary | ICD-10-CM | POA: Diagnosis present

## 2019-04-15 LAB — URINALYSIS, COMPLETE (UACMP) WITH MICROSCOPIC
Bilirubin Urine: NEGATIVE
Glucose, UA: NEGATIVE mg/dL
Ketones, ur: NEGATIVE mg/dL
Nitrite: NEGATIVE
Protein, ur: NEGATIVE mg/dL
Specific Gravity, Urine: 1.001 — ABNORMAL LOW (ref 1.005–1.030)
Squamous Epithelial / HPF: NONE SEEN (ref 0–5)
pH: 7 (ref 5.0–8.0)

## 2019-04-15 LAB — BASIC METABOLIC PANEL
Anion gap: 11 (ref 5–15)
BUN: 16 mg/dL (ref 6–20)
CO2: 24 mmol/L (ref 22–32)
Calcium: 8.9 mg/dL (ref 8.9–10.3)
Chloride: 101 mmol/L (ref 98–111)
Creatinine, Ser: 1.41 mg/dL — ABNORMAL HIGH (ref 0.44–1.00)
GFR calc Af Amer: 47 mL/min — ABNORMAL LOW (ref >60)
GFR calc non Af Amer: 40 mL/min — ABNORMAL LOW (ref >60)
Glucose, Bld: 112 mg/dL — ABNORMAL HIGH (ref 70–99)
Potassium: 3.4 mmol/L — ABNORMAL LOW (ref 3.5–5.1)
Sodium: 136 mmol/L (ref 135–145)

## 2019-04-15 LAB — CBC
HCT: 35.7 % — ABNORMAL LOW (ref 36.0–46.0)
Hemoglobin: 12.3 g/dL (ref 12.0–15.0)
MCH: 35.5 pg — ABNORMAL HIGH (ref 26.0–34.0)
MCHC: 34.5 g/dL (ref 30.0–36.0)
MCV: 103.2 fL — ABNORMAL HIGH (ref 80.0–100.0)
Platelets: 253 10*3/uL (ref 150–400)
RBC: 3.46 MIL/uL — ABNORMAL LOW (ref 3.87–5.11)
RDW: 12.3 % (ref 11.5–15.5)
WBC: 10.2 10*3/uL (ref 4.0–10.5)
nRBC: 0 % (ref 0.0–0.2)

## 2019-04-15 MED ORDER — CEPHALEXIN 500 MG PO CAPS
500.0000 mg | ORAL_CAPSULE | Freq: Once | ORAL | Status: AC
  Administered 2019-04-15: 500 mg via ORAL
  Filled 2019-04-15: qty 1

## 2019-04-15 MED ORDER — CEPHALEXIN 500 MG PO CAPS: 500.0000 mg | ORAL_CAPSULE | Freq: Four times a day (QID) | ORAL | 0 refills | Status: AC

## 2019-04-15 NOTE — Discharge Instructions (Addendum)
Please take antibiotics as prescribed.  Make sure you are drinking lots of fluids.  Return to ER for any back pain, fevers, nausea, vomiting, worsening symptoms or urgent changes in health.

## 2019-04-15 NOTE — ED Notes (Signed)
Pt ambulated to room with steady gait in nad.

## 2019-04-15 NOTE — ED Triage Notes (Signed)
Patient reports lower back pain and blood in her urine for the past 5 weeks.  Patient reports treated for a UTI but symptoms persist.

## 2019-04-15 NOTE — ED Provider Notes (Signed)
[facility] EMERGENCY DEPARTMENT Provider Note   CSN: [id number] Arrival date & time: 04/15/19  1947     History Chief Complaint  Patient presents with  . Back Pain  . Hematuria    Patricia Good is a 59 y.o. female presents to the emergency department for evaluation of lower back pain and blood in urine for 5 weeks.  She states several weeks ago treated for UTI but symptoms persisted.  She states the medication that she took for UTI was a tapered medication where she decreased the amount of pills she took every day for 6 days.  No records available to review medications prescribed nor labs to review.  She denies any painful urination has had increase in urinary frequency along with blood noted in her urine.  She has no fevers, nausea vomiting.  She complains of mild lower back pain.  HPI     Past Medical History:  Diagnosis Date  . Abnormal thyroid stimulating hormone (TSH) level   . ADHD (attention deficit hyperactivity disorder)   . Anxiety   . Asthma   . PTSD (post-traumatic stress disorder)   . Schizoaffective disorder [facility])     Patient Active Problem List   Diagnosis Date Noted  . Schizoaffective disorder, in remission ([place]) 04/04/2019  . Social anxiety disorder 09/19/2018  . Insomnia 09/19/2018  . Urinary tract infection 12/22/2017  . Schizoaffective disorder, bipolar type ([place]) 12/22/2017  . Bipolar affective disorder, depressed, severe, with psychotic behavior ([place]) 02/12/2017  . PTSD (post-traumatic stress disorder) 09/23/2015  . Arthritis 01/23/2015  . H/O Malignant melanoma 06/15/2014  . Bipolar I disorder ([place]) 09/06/2013  . HTN (hypertension) 09/06/2013  . Peripheral vascular disease ([place]) 09/06/2013  . Tobacco use disorder 09/06/2013    Past Surgical History:  Procedure Laterality Date  . ABDOMINAL HYSTERECTOMY    . ECTOPIC PREGNANCY SURGERY       OB History   No obstetric history on file.     Family History  Problem  Relation Age of Onset  . Hypertension Father   . Diabetes Father   . Heart disease Father   . Blindness Father   . Obesity Brother   . Arthritis Brother   . Alcohol abuse Brother   . Drug abuse Brother   . Depression Brother   . Alcohol abuse Sister   . Drug abuse Sister   . Anxiety disorder Sister   . Depression Sister   . Breast cancer Neg Hx     Social History   Tobacco Use  . Smoking status: Current Some Day Smoker    Packs/day: 0.25    Types: Cigarettes    Start date: 01/22/1969  . Smokeless tobacco: Never Used  . Tobacco comment: currently 1 cig every two days or so   Substance Use Topics  . Alcohol use: Yes    Alcohol/week: 1.0 - 2.0 standard drinks    Types: 1 Glasses of wine per week  . Drug use: No    Home Medications Prior to Admission medications   Medication Sig Start Date End Date Taking? Authorizing Provider  albuterol (PROAIR HFA) 108 (90 Base) MCG/ACT inhaler Inhale 1-2 puffs into the lungs every 4 (four) hours as needed for wheezing or shortness of breath.     [provider]  amLODipine (NORVASC) 10 MG tablet Take 10 mg by mouth daily.    [provider]  ARIPiprazole (ABILIFY) 30 MG tablet Take 1 tablet (30 mg total) by mouth daily. 12/13/18  Ursula Alert, MD  B Complex-C (B-COMPLEX WITH VITAMIN C) tablet Take 1 tablet by mouth daily.    [provider]  betamethasone dipropionate (DIPROLENE) 0.05 % cream  10/26/18   [provider]  buPROPion (WELLBUTRIN XL) 300 MG 24 hr tablet Take 1 tablet (300 mg total) by mouth daily. 02/07/19   Ursula Alert, MD  calcipotriene (DOVONOX) 0.005 % cream  10/26/18   [provider]  calcium-vitamin D (OSCAL WITH D) 500-200 MG-UNIT tablet Take 1 tablet by mouth daily with breakfast.    [provider]  cephALEXin (KEFLEX) 500 MG capsule Take 1 capsule (500 mg total) by mouth 4 (four) times daily for 7 days. 04/15/19 04/22/19  Duanne Guess, PA-C  ciprofloxacin  (CIPRO) 500 MG tablet Take 500 mg by mouth 2 (two) times daily. 02/02/19   [provider]  ferrous sulfate 325 (65 FE) MG tablet Take 325 mg by mouth.    [provider]  fluticasone (FLOVENT HFA) 220 MCG/ACT inhaler Inhale 2 puffs 2 (two) times daily into the lungs.    [provider]  furosemide (LASIX) 20 MG tablet Take 20 mg by mouth daily.     [provider]  hydrOXYzine (ATARAX/VISTARIL) 10 MG tablet Take 1 tablet (10 mg total) by mouth 3 (three) times daily as needed. for anxiety 11/02/18   Ursula Alert, MD  levothyroxine (SYNTHROID, LEVOTHROID) 112 MCG tablet  05/04/18   [provider]  metroNIDAZOLE (FLAGYL) 500 MG tablet  07/26/18   [provider]  omeprazole (PRILOSEC) 40 MG capsule Take 40 mg by mouth daily.     [provider]  OTEZLA 10 & 20 & 30 MG TBPK  11/25/18   [provider]  SPIRIVA HANDIHALER 18 MCG inhalation capsule  04/19/18   [provider]  triamcinolone cream (KENALOG) 0.1 %  10/18/18   [provider]  valACYclovir (VALTREX) 1000 MG tablet  10/11/18   [provider]  valACYclovir (VALTREX) 500 MG tablet Take 500 mg by mouth daily.     [provider]  varenicline (CHANTIX) 0.5 MG tablet Take 1 tablet (0.5 mg total) by mouth daily. 01/12/19   Ursula Alert, MD  XIIDRA 5 % SOLN  11/11/18   [provider]  zolpidem (AMBIEN) 5 MG tablet Take 1 tablet (5 mg total) by mouth at bedtime as needed for sleep. 04/04/19   Ursula Alert, MD    Allergies    Shellfish allergy, Sulfa antibiotics, Sulfasalazine, and Lisinopril  Review of Systems   Review of Systems  Constitutional: Negative for chills and fever.  Respiratory: Negative for shortness of breath.   Cardiovascular: Negative for chest pain.  Genitourinary: Positive for frequency and hematuria. Negative for dysuria, vaginal bleeding, vaginal discharge and vaginal pain.  Musculoskeletal: Positive for  back pain. Negative for gait problem and myalgias.  Neurological: Negative for numbness.    Physical Exam Updated Vital Signs BP 124/64 (BP Location: Left Arm)   Pulse 89   Temp 98.8 F (37.1 C) (Oral)   Resp 18   Ht 5\' 1"  (1.549 m)   Wt 102.1 kg   SpO2 96%   BMI 42.51 kg/m   Physical Exam Constitutional:      Appearance: She is well-developed.  HENT:     Head: Normocephalic and atraumatic.  Eyes:     Conjunctiva/sclera: Conjunctivae normal.  Cardiovascular:     Rate and Rhythm: Normal rate.  Pulmonary:     Effort: Pulmonary effort is  normal. No respiratory distress.  Abdominal:     General: There is no distension.     Tenderness: There is no abdominal tenderness. There is no guarding.  Musculoskeletal:        General: No swelling or tenderness. Normal range of motion.     Cervical back: Normal range of motion.     Comments: No CVA or spinous process tenderness.  Skin:    General: Skin is warm.     Findings: No rash.  Neurological:     Mental Status: She is alert and oriented to person, place, and time.  Psychiatric:        Behavior: Behavior normal.        Thought Content: Thought content normal.     ED Results / Procedures / Treatments   Labs (all labs ordered are listed, but only abnormal results are displayed) Labs Reviewed  URINALYSIS, COMPLETE (UACMP) WITH MICROSCOPIC - Abnormal; Notable for the following components:      Result Value   Color, Urine STRAW (*)    APPearance HAZY (*)    Specific Gravity, Urine 1.001 (*)    Hgb urine dipstick SMALL (*)    Leukocytes,Ua LARGE (*)    Bacteria, UA RARE (*)    All other components within normal limits  BASIC METABOLIC PANEL - Abnormal; Notable for the following components:   Potassium 3.4 (*)    Glucose, Bld 112 (*)    Creatinine, Ser 1.41 (*)    GFR calc non Af Amer 40 (*)    GFR calc Af Amer 47 (*)    All other components within normal limits  CBC - Abnormal; Notable for the following components:    RBC 3.46 (*)    HCT 35.7 (*)    MCV 103.2 (*)    MCH 35.5 (*)    All other components within normal limits  URINE CULTURE    EKG None  Radiology No results found.  Procedures Procedures (including critical care time)  Medications Ordered in ED Medications  cephALEXin (KEFLEX) capsule 500 mg (has no administration in time range)    ED Course  I have reviewed the triage vital signs and the nursing notes.  Pertinent labs & imaging results that were available during my care of the patient were reviewed by me and considered in my medical decision making (see chart for details).    MDM Rules/Calculators/A&P                      58 year old female UTI.  Vital signs stable, afebrile.  Not tachycardic.  CBC normal.  Slight drop in potassium, 3.4 encouraged to eat a high potassium diet.  She is placed on cephalexin for UTI.  Urine culture obtained.  She understands signs and symptoms return to ED for such as back pain, fevers, nausea vomiting or worsening urinary symptoms. Final Clinical Impression(s) / ED Diagnoses Final diagnoses:  Acute cystitis with hematuria    Rx / DC Orders ED Discharge Orders         Ordered    cephALEXin (KEFLEX) 500 MG capsule  4 times daily     04/15/19 2234           Renata Caprice 04/15/19 2242    Lilia Pro., MD 04/15/19 [phone]

## 2019-04-17 LAB — URINE CULTURE
Culture: >=100000 — AB
Special Requests: NORMAL

## 2019-04-21 ENCOUNTER — Telehealth: Payer: Self-pay

## 2019-04-21 DIAGNOSIS — G4700 Insomnia, unspecified: Secondary | ICD-10-CM

## 2019-04-21 DIAGNOSIS — Z716 Tobacco abuse counseling: Secondary | ICD-10-CM

## 2019-04-21 MED ORDER — ZOLPIDEM TARTRATE 5 MG PO TABS: 5.0000 mg | ORAL_TABLET | Freq: Every evening | ORAL | 0 refills | Status: DC | PRN

## 2019-04-21 MED ORDER — VARENICLINE TARTRATE 0.5 MG PO TABS: 0.5000 mg | ORAL_TABLET | Freq: Every day | ORAL | 2 refills | Status: DC

## 2019-04-21 NOTE — Telephone Encounter (Signed)
pt called states she also needs a refil on chantix

## 2019-04-21 NOTE — Telephone Encounter (Signed)
Sent Ambien to pharmacy.  Sent Chantix to pharmacy.

## 2019-04-21 NOTE — Telephone Encounter (Signed)
Pt called she would like a refill on her ambien   zolpidem (AMBIEN) 5 MG tablet Medication Date: 04/04/2019 Department: [facility] Ordering/Authorizing: Ursula Alert, MD  Order Providers  Prescribing Provider Encounter Provider  Ursula Alert, MD Carlynn Purl, CMA  Outpatient Medication Detail   Disp Refills Start End   zolpidem (AMBIEN) 5 MG tablet 15 tablet 0 04/04/2019    Sig - Route: Take 1 tablet (5 mg total) by mouth at bedtime as needed for sleep. - Oral   Sent to pharmacy as: zolpidem (AMBIEN) 5 MG tablet   E-Prescribing Status: Receipt confirmed by pharmacy (04/04/2019 2:15 PM EST)

## 2019-04-26 ENCOUNTER — Other Ambulatory Visit: Payer: Self-pay | Admitting: Psychiatry

## 2019-04-26 DIAGNOSIS — G4700 Insomnia, unspecified: Secondary | ICD-10-CM

## 2019-04-27 ENCOUNTER — Telehealth: Payer: Self-pay

## 2019-04-27 DIAGNOSIS — F259 Schizoaffective disorder, unspecified: Secondary | ICD-10-CM

## 2019-04-27 MED ORDER — LAMOTRIGINE 25 MG PO TABS: 25.0000 mg | ORAL_TABLET | Freq: Every day | ORAL | 0 refills | Status: DC

## 2019-04-27 NOTE — Telephone Encounter (Signed)
pt called states that the depression medication does not seem to be working anymore. pt states she depressed, sleeping too much, no energy

## 2019-04-27 NOTE — Telephone Encounter (Signed)
Returned call to patient.  She reports she is feeling depressed.  She is also having sleep problems.  She reports she has some days when she sleeps too much and sometimes she has sleep issues.  Discussed with patient that she can be started on Lamictal.  Start Lamictal 25 mg p.o. daily.  Advised patient to stop the Chantix if she is having a lot of mood problems.  Continue Ambien 5 mg, could increase to 10 mg as needed if she is having sleep issues.

## 2019-05-03 ENCOUNTER — Other Ambulatory Visit: Payer: Self-pay | Admitting: Psychiatry

## 2019-05-03 DIAGNOSIS — G4700 Insomnia, unspecified: Secondary | ICD-10-CM

## 2019-05-04 ENCOUNTER — Ambulatory Visit (INDEPENDENT_AMBULATORY_CARE_PROVIDER_SITE_OTHER): Payer: Medicare Other | Admitting: Psychiatry

## 2019-05-04 ENCOUNTER — Other Ambulatory Visit: Payer: Self-pay

## 2019-05-04 ENCOUNTER — Encounter: Payer: Self-pay | Admitting: Psychiatry

## 2019-05-04 DIAGNOSIS — F17200 Nicotine dependence, unspecified, uncomplicated: Secondary | ICD-10-CM

## 2019-05-04 DIAGNOSIS — F4310 Post-traumatic stress disorder, unspecified: Secondary | ICD-10-CM | POA: Diagnosis not present

## 2019-05-04 DIAGNOSIS — F250 Schizoaffective disorder, bipolar type: Secondary | ICD-10-CM | POA: Diagnosis not present

## 2019-05-04 DIAGNOSIS — G4700 Insomnia, unspecified: Secondary | ICD-10-CM | POA: Diagnosis not present

## 2019-05-04 DIAGNOSIS — F4010 Social phobia, unspecified: Secondary | ICD-10-CM

## 2019-05-04 MED ORDER — ZOLPIDEM TARTRATE 5 MG PO TABS: 5.0000 mg | ORAL_TABLET | Freq: Every evening | ORAL | 1 refills | Status: DC | PRN

## 2019-05-04 MED ORDER — ARIPIPRAZOLE 30 MG PO TABS: 30.0000 mg | ORAL_TABLET | Freq: Every day | ORAL | 1 refills | Status: DC

## 2019-05-04 NOTE — Progress Notes (Signed)
Provider Location : [facility] Patient Location : Home   Virtual Visit via Telephone Note  I connected with Patricia Good on 05/04/19 at  2:30 PM EST by telephone and verified that I am speaking with the correct person using two identifiers.   I discussed the limitations, risks, security and privacy concerns of performing an evaluation and management service by telephone and the availability of in person appointments. I also discussed with the patient that there may be a patient responsible charge related to this service. The patient expressed understanding and agreed to proceed.    I discussed the assessment and treatment plan with the patient. The patient was provided an opportunity to ask questions and all were answered. The patient agreed with the plan and demonstrated an understanding of the instructions.   The patient was advised to call back or seek an in-person evaluation if the symptoms worsen or if the condition fails to improve as anticipated.   [place] MD OP Progress Note  05/04/2019 5:12 PM Patricia Good  MRN:  [id number]  Chief Complaint:  Chief Complaint    Follow-up     HPI: Patricia Good is a 59 year old female, divorced, lives in [place], has a history of schizoaffective disorder, social anxiety disorder, PTSD, COPD, vitamin B12 deficiency, psoriasis, history of malignant melanoma, hypothyroidism, osteoarthritis was evaluated by phone today.  Patient preferred to do a phone call.  Patient today reports she is currently struggling with urinary symptoms.  She is waiting for her appointment with urologist.  She reports her health problems does make her depressed.  She however has not started the Lamictal yet.  She reports she will be able to start it soon.  She reports sleep is improved on the Ambien.  Patient denies any suicidality, homicidality or perceptual disturbances.  Patient reports she stopped taking the Chantix since she was having more problems.  She continues to cut back  on cigarette smoking.  She denies any other concerns today. Visit Diagnosis:    ICD-10-CM   1. Schizoaffective disorder, bipolar type ([place])  F25.0 ARIPiprazole (ABILIFY) 30 MG tablet  2. PTSD (post-traumatic stress disorder)  F43.10 ARIPiprazole (ABILIFY) 30 MG tablet  3. Social anxiety disorder  F40.10   4. Insomnia, unspecified type  G47.00 zolpidem (AMBIEN) 5 MG tablet  5. Tobacco use disorder  F17.200     Past Psychiatric History: I have reviewed past psychiatric history from my progress note on 07/16/2017.  Past trials of Prozac, trazodone.  Past Medical History:  Past Medical History:  Diagnosis Date  . Abnormal thyroid stimulating hormone (TSH) level   . ADHD (attention deficit hyperactivity disorder)   . Anxiety   . Asthma   . PTSD (post-traumatic stress disorder)   . Schizoaffective disorder [facility])     Past Surgical History:  Procedure Laterality Date  . ABDOMINAL HYSTERECTOMY    . ECTOPIC PREGNANCY SURGERY      Family Psychiatric History: Reviewed family psychiatric history from my progress note on 07/16/2017.  Family History:  Family History  Problem Relation Age of Onset  . Hypertension Father   . Diabetes Father   . Heart disease Father   . Blindness Father   . Obesity Brother   . Arthritis Brother   . Alcohol abuse Brother   . Drug abuse Brother   . Depression Brother   . Alcohol abuse Sister   . Drug abuse Sister   . Anxiety disorder Sister   . Depression Sister   . Breast cancer Neg  Hx     Social History: Reviewed social history from my progress note on 07/16/2017. Social History   Socioeconomic History  . Marital status: Single    Spouse name: Not on file  . Number of children: 0  . Years of education: Not on file  . Highest education level: Associate degree: occupational, Hotel manager, or vocational program  Occupational History  . Not on file  Tobacco Use  . Smoking status: Current Some Day Smoker    Packs/day: 0.25    Types: Cigarettes     Start date: 01/22/1969  . Smokeless tobacco: Never Used  . Tobacco comment: currently 1 cig every two days or so   Substance and Sexual Activity  . Alcohol use: Yes    Alcohol/week: 1.0 - 2.0 standard drinks    Types: 1 Glasses of wine per week  . Drug use: No  . Sexual activity: Not Currently  Other Topics Concern  . Not on file  Social History Narrative  . Not on file   Social Determinants of Health   Financial Resource Strain:   . Difficulty of Paying Living Expenses: Not on file  Food Insecurity:   . Worried About Charity fundraiser in the Last Year: Not on file  . Ran Out of Food in the Last Year: Not on file  Transportation Needs:   . Lack of Transportation (Medical): Not on file  . Lack of Transportation (Non-Medical): Not on file  Physical Activity:   . Days of Exercise per Week: Not on file  . Minutes of Exercise per Session: Not on file  Stress:   . Feeling of Stress : Not on file  Social Connections:   . Frequency of Communication with Friends and Family: Not on file  . Frequency of Social Gatherings with Friends and Family: Not on file  . Attends Religious Services: Not on file  . Active Member of Clubs or Organizations: Not on file  . Attends Archivist Meetings: Not on file  . Marital Status: Not on file    Allergies:  Allergies  Allergen Reactions  . Shellfish Allergy Anaphylaxis    Only to crab meat (non-imitation), not shrimp  . Sulfa Antibiotics Hives, Anaphylaxis and Other (See Comments)  . Sulfasalazine Anaphylaxis and Hives    Other reaction(s): Other (See Comments)  . Lisinopril     Metabolic Disorder Labs: Lab Results  Component Value Date   HGBA1C 5.7 (H) 12/21/2017   MPG 117 12/21/2017   Lab Results  Component Value Date   PROLACTIN 1.2 (L) 07/12/2017   Lab Results  Component Value Date   CHOL 241 (H) 12/21/2017   TRIG 115 12/21/2017   HDL 55 12/21/2017   CHOLHDL 4.4 12/21/2017   VLDL 23 12/21/2017   LDLCALC 163 (H)  12/21/2017   LDLCALC 174 (H) 07/12/2017   Lab Results  Component Value Date   TSH 0.199 (L) 12/21/2017   TSH 0.465 07/12/2017    Therapeutic Level Labs: No results found for: LITHIUM No results found for: VALPROATE No components found for:  CBMZ  Current Medications: Current Outpatient Medications  Medication Sig Dispense Refill  . albuterol (PROAIR HFA) 108 (90 Base) MCG/ACT inhaler Inhale 1-2 puffs into the lungs every 4 (four) hours as needed for wheezing or shortness of breath.     Marland Kitchen amLODipine (NORVASC) 10 MG tablet Take 10 mg by mouth daily.    . ARIPiprazole (ABILIFY) 30 MG tablet Take 1 tablet (30 mg total) by mouth  daily. 90 tablet 1  . B Complex-C (B-COMPLEX WITH VITAMIN C) tablet Take 1 tablet by mouth daily.    . betamethasone dipropionate (DIPROLENE) 0.05 % cream     . buPROPion (WELLBUTRIN XL) 300 MG 24 hr tablet Take 1 tablet (300 mg total) by mouth daily. 90 tablet 1  . calcipotriene (DOVONOX) 0.005 % cream     . calcium-vitamin D (OSCAL WITH D) 500-200 MG-UNIT tablet Take 1 tablet by mouth daily with breakfast.    . ciprofloxacin (CIPRO) 500 MG tablet Take 500 mg by mouth 2 (two) times daily.    . ferrous sulfate 325 (65 FE) MG tablet Take 325 mg by mouth.    . fluticasone (FLOVENT HFA) 220 MCG/ACT inhaler Inhale 2 puffs 2 (two) times daily into the lungs.    . furosemide (LASIX) 20 MG tablet Take 20 mg by mouth daily.     . hydrOXYzine (ATARAX/VISTARIL) 10 MG tablet Take 1 tablet (10 mg total) by mouth 3 (three) times daily as needed. for anxiety 270 tablet 0  . lamoTRIgine (LAMICTAL) 25 MG tablet Take 1 tablet (25 mg total) by mouth daily. 30 tablet 0  . levothyroxine (SYNTHROID, LEVOTHROID) 112 MCG tablet     . metroNIDAZOLE (FLAGYL) 500 MG tablet     . omeprazole (PRILOSEC) 40 MG capsule Take 40 mg by mouth daily.     Marland Kitchen OTEZLA 10 & 20 & 30 MG TBPK     . SPIRIVA HANDIHALER 18 MCG inhalation capsule     . triamcinolone cream (KENALOG) 0.1 %     . valACYclovir  (VALTREX) 1000 MG tablet     . valACYclovir (VALTREX) 500 MG tablet Take 500 mg by mouth daily.     Marland Kitchen XIIDRA 5 % SOLN     . [START ON 05/20/2019] zolpidem (AMBIEN) 5 MG tablet Take 1 tablet (5 mg total) by mouth at bedtime as needed for sleep. 30 tablet 1   No current facility-administered medications for this visit.     Musculoskeletal: Strength & Muscle Tone: UTA Gait & Station: Reports as WNL Patient leans: N/A  Psychiatric Specialty Exam: Review of Systems  Genitourinary: Positive for dysuria.  Psychiatric/Behavioral: Positive for dysphoric mood.  All other systems reviewed and are negative.   There were no vitals taken for this visit.There is no height or weight on file to calculate BMI.  General Appearance: UTA  Eye Contact:  UTA  Speech:  Clear and Coherent  Volume:  Normal  Mood:  Depressed  Affect:  UTA  Thought Process:  Goal Directed and Descriptions of Associations: Intact  Orientation:  Full (Time, Place, and Person)  Thought Content: Logical   Suicidal Thoughts:  No  Homicidal Thoughts:  No  Memory:  Immediate;   Fair Recent;   Fair Remote;   Fair  Judgement:  Fair  Insight:  Fair  Psychomotor Activity:  UTA  Concentration:  Concentration: Fair and Attention Span: Fair  Recall:  [employer] of Knowledge: Fair  Language: Fair  Akathisia:  No  Handed:  Right  AIMS (if indicated): denies tremors, rigidity  Assets:  Communication Skills Desire for Improvement Housing Social Support  ADL's:  Intact  Cognition: WNL  Sleep:  Improving   Screenings: AUDIT     Admission (Discharged) from 12/22/2017 in [place]  Alcohol Use Disorder Identification Test Final Score (AUDIT)  1       Assessment and Plan: Nyasia is a 59 year old Caucasian female, divorced on SSD,  lives in [place], has a history of schizoaffective disorder, social anxiety disorder, PTSD, COPD, hyperlipidemia, vitamin B12 deficiency, psoriasis, recent UTI,  hypothyroidism, osteoarthritis was evaluated by phone today.  Patient is currently struggling with depressive symptoms more so because of her recent health problems.  Patient will continue to benefit from medication readjustment.  Plan as noted below.  Plan Schizoaffective disorder-unstable Abilify 30 mg p.o. daily Start Lamictal 25 mg p.o. daily-prescription was ordered few days ago however she has not started the medication yet.  Encouraged her to do so. Wellbutrin extended release 300 mg p.o. daily  PTSD-stable Abilify and Wellbutrin as prescribed Continue CBT .  Insomnia-improving Ambien 5 mg p.o. nightly  Tobacco use disorder-improving Discontinue Chantix for noncompliance Provided smoking cessation counseling.  Follow-up in clinic in 4 weeks or sooner if needed.  February 24 at 10:30 AM  I have spent atleast 20 minutes non face to face with patient today. More than 50 % of the time was spent for  ordering medications and test ,psychoeducation and supportive psychotherapy and care coordination,as well as documenting clinical information in electronic health record. This note was generated in part or whole with voice recognition software. Voice recognition is usually quite accurate but there are transcription errors that can and very often do occur. I apologize for any typographical errors that were not detected and corrected.       Ursula Alert, MD 05/04/2019, 5:12 PM

## 2019-05-18 ENCOUNTER — Telehealth: Payer: Self-pay

## 2019-05-18 NOTE — Telephone Encounter (Signed)
pt called states she needs a letter that her family is causing her stress and that she needs to get a restraining order

## 2019-05-18 NOTE — Telephone Encounter (Signed)
She needs to contact [employer] .Please let her know.

## 2019-05-29 ENCOUNTER — Telehealth: Payer: Self-pay

## 2019-05-29 DIAGNOSIS — F259 Schizoaffective disorder, unspecified: Secondary | ICD-10-CM

## 2019-05-29 MED ORDER — LAMOTRIGINE 25 MG PO TABS: 25.0000 mg | ORAL_TABLET | Freq: Two times a day (BID) | ORAL | 1 refills | Status: DC

## 2019-05-29 NOTE — Telephone Encounter (Signed)
pt called states she can not take the amiben that she flushed it .

## 2019-05-29 NOTE — Telephone Encounter (Signed)
Returned call to patient.  She reports she is depressed.  She is sleeping better and hence flushed all her Ambien since she does not want to get addicted to it.  She reports she wants to talk to her therapist since she has not seen her in a while.  We will transfer her call to [facility] at front desk so she can be scheduled with another therapist.  Increase lamotrigine to 25 mg twice a day.  Crisis plan discussed with patient.

## 2019-05-31 ENCOUNTER — Telehealth: Payer: Self-pay | Admitting: Psychiatry

## 2019-05-31 ENCOUNTER — Ambulatory Visit (INDEPENDENT_AMBULATORY_CARE_PROVIDER_SITE_OTHER): Payer: Medicare Other | Admitting: Psychiatry

## 2019-05-31 ENCOUNTER — Encounter: Payer: Self-pay | Admitting: Psychiatry

## 2019-05-31 ENCOUNTER — Other Ambulatory Visit: Payer: Self-pay

## 2019-05-31 DIAGNOSIS — F4010 Social phobia, unspecified: Secondary | ICD-10-CM | POA: Diagnosis not present

## 2019-05-31 DIAGNOSIS — F259 Schizoaffective disorder, unspecified: Secondary | ICD-10-CM | POA: Diagnosis not present

## 2019-05-31 DIAGNOSIS — F17200 Nicotine dependence, unspecified, uncomplicated: Secondary | ICD-10-CM

## 2019-05-31 DIAGNOSIS — F4310 Post-traumatic stress disorder, unspecified: Secondary | ICD-10-CM | POA: Diagnosis not present

## 2019-05-31 DIAGNOSIS — G4700 Insomnia, unspecified: Secondary | ICD-10-CM

## 2019-05-31 NOTE — Progress Notes (Signed)
Provider Location : [facility] Patient Location : Home  Virtual Visit via Telephone Note  I connected with Patricia Good on 05/31/19 at 10:30 AM EST by telephone and verified that I am speaking with the correct person using two identifiers.   I discussed the limitations, risks, security and privacy concerns of performing an evaluation and management service by telephone and the availability of in person appointments. I also discussed with the patient that there may be a patient responsible charge related to this service. The patient expressed understanding and agreed to proceed.   I discussed the assessment and treatment plan with the patient. The patient was provided an opportunity to ask questions and all were answered. The patient agreed with the plan and demonstrated an understanding of the instructions.   The patient was advised to call back or seek an in-person evaluation if the symptoms worsen or if the condition fails to improve as anticipated.   [place] MD OP Progress Note  05/31/2019 12:05 PM Patricia Good  MRN:  [id number]  Chief Complaint:  Chief Complaint    Follow-up     HPI: Patricia Good is a 59 year old female, divorced, lives in [place], has a history of schizoaffective disorder, social anxiety disorder, PTSD, COPD, vitamin B12 deficiency, psoriasis, history of malignant melanoma, hypothyroidism, osteoarthritis was evaluated by phone today.  Patient preferred to do a phone call.  Patient today reports since starting the Lamictal she has been making progress.  Her mood symptoms have improved.  She reports sleep continues to be good.  She does not take Ambien anymore.  She is able to sleep without any medications.  She continues to have psychosocial stressors of relationship struggles with her mother, family members.  She however reports she wants to talk about it in therapy .  Patient denies any suicidality, homicidality or perceptual disturbances.  Patient denies any other concerns  today. Visit Diagnosis:    ICD-10-CM   1. Schizoaffective disorder, in remission ([place])  F25.9   2. PTSD (post-traumatic stress disorder)  F43.10   3. Social anxiety disorder  F40.10   4. Insomnia, unspecified type  G47.00   5. Tobacco use disorder  F17.200     Past Psychiatric History: I have reviewed past psychiatric history from my progress note on 07/16/2017.  Past trials of Prozac, trazodone  Past Medical History:  Past Medical History:  Diagnosis Date  . Abnormal thyroid stimulating hormone (TSH) level   . ADHD (attention deficit hyperactivity disorder)   . Anxiety   . Asthma   . PTSD (post-traumatic stress disorder)   . Schizoaffective disorder [facility])     Past Surgical History:  Procedure Laterality Date  . ABDOMINAL HYSTERECTOMY    . ECTOPIC PREGNANCY SURGERY      Family Psychiatric History: I have reviewed family psychiatric history from my progress note on 07/16/2017.  Family History:  Family History  Problem Relation Age of Onset  . Hypertension Father   . Diabetes Father   . Heart disease Father   . Blindness Father   . Obesity Brother   . Arthritis Brother   . Alcohol abuse Brother   . Drug abuse Brother   . Depression Brother   . Alcohol abuse Sister   . Drug abuse Sister   . Anxiety disorder Sister   . Depression Sister   . Breast cancer Neg Hx     Social History: Reviewed social history from my progress note on 07/16/2017. Social History   Socioeconomic History  .  Marital status: Single    Spouse name: Not on file  . Number of children: 0  . Years of education: Not on file  . Highest education level: Associate degree: occupational, Hotel manager, or vocational program  Occupational History  . Not on file  Tobacco Use  . Smoking status: Current Some Day Smoker    Packs/day: 0.25    Types: Cigarettes    Start date: 01/22/1969  . Smokeless tobacco: Never Used  . Tobacco comment: currently 1 cig every two days or so   Substance and Sexual Activity   . Alcohol use: Yes    Alcohol/week: 1.0 - 2.0 standard drinks    Types: 1 Glasses of wine per week  . Drug use: No  . Sexual activity: Not Currently  Other Topics Concern  . Not on file  Social History Narrative  . Not on file   Social Determinants of Health   Financial Resource Strain:   . Difficulty of Paying Living Expenses: Not on file  Food Insecurity:   . Worried About Charity fundraiser in the Last Year: Not on file  . Ran Out of Food in the Last Year: Not on file  Transportation Needs:   . Lack of Transportation (Medical): Not on file  . Lack of Transportation (Non-Medical): Not on file  Physical Activity:   . Days of Exercise per Week: Not on file  . Minutes of Exercise per Session: Not on file  Stress:   . Feeling of Stress : Not on file  Social Connections:   . Frequency of Communication with Friends and Family: Not on file  . Frequency of Social Gatherings with Friends and Family: Not on file  . Attends Religious Services: Not on file  . Active Member of Clubs or Organizations: Not on file  . Attends Archivist Meetings: Not on file  . Marital Status: Not on file    Allergies:  Allergies  Allergen Reactions  . Shellfish Allergy Anaphylaxis    Only to crab meat (non-imitation), not shrimp  . Sulfa Antibiotics Hives, Anaphylaxis and Other (See Comments)  . Sulfasalazine Anaphylaxis and Hives    Other reaction(s): Other (See Comments)  . Lisinopril     Metabolic Disorder Labs: Lab Results  Component Value Date   HGBA1C 5.7 (H) 12/21/2017   MPG 117 12/21/2017   Lab Results  Component Value Date   PROLACTIN 1.2 (L) 07/12/2017   Lab Results  Component Value Date   CHOL 241 (H) 12/21/2017   TRIG 115 12/21/2017   HDL 55 12/21/2017   CHOLHDL 4.4 12/21/2017   VLDL 23 12/21/2017   LDLCALC 163 (H) 12/21/2017   LDLCALC 174 (H) 07/12/2017   Lab Results  Component Value Date   TSH 0.199 (L) 12/21/2017   TSH 0.465 07/12/2017     Therapeutic Level Labs: No results found for: LITHIUM No results found for: VALPROATE No components found for:  CBMZ  Current Medications: Current Outpatient Medications  Medication Sig Dispense Refill  . albuterol (PROAIR HFA) 108 (90 Base) MCG/ACT inhaler Inhale 1-2 puffs into the lungs every 4 (four) hours as needed for wheezing or shortness of breath.     Marland Kitchen amLODipine (NORVASC) 10 MG tablet Take 10 mg by mouth daily.    . ARIPiprazole (ABILIFY) 30 MG tablet Take 1 tablet (30 mg total) by mouth daily. 90 tablet 1  . B Complex-C (B-COMPLEX WITH VITAMIN C) tablet Take 1 tablet by mouth daily.    . betamethasone  dipropionate (DIPROLENE) 0.05 % cream     . buPROPion (WELLBUTRIN XL) 300 MG 24 hr tablet Take 1 tablet (300 mg total) by mouth daily. 90 tablet 1  . calcipotriene (DOVONOX) 0.005 % cream     . calcium-vitamin D (OSCAL WITH D) 500-200 MG-UNIT tablet Take 1 tablet by mouth daily with breakfast.    . ciprofloxacin (CIPRO) 500 MG tablet Take 500 mg by mouth 2 (two) times daily.    . ferrous sulfate 325 (65 FE) MG tablet Take 325 mg by mouth.    . fluticasone (FLOVENT HFA) 220 MCG/ACT inhaler Inhale 2 puffs 2 (two) times daily into the lungs.    . furosemide (LASIX) 20 MG tablet Take 20 mg by mouth daily.     . hydrOXYzine (ATARAX/VISTARIL) 10 MG tablet Take 1 tablet (10 mg total) by mouth 3 (three) times daily as needed. for anxiety 270 tablet 0  . lamoTRIgine (LAMICTAL) 25 MG tablet Take 1 tablet (25 mg total) by mouth 2 (two) times daily. 60 tablet 1  . levothyroxine (SYNTHROID, LEVOTHROID) 112 MCG tablet     . metroNIDAZOLE (FLAGYL) 500 MG tablet     . omeprazole (PRILOSEC) 40 MG capsule Take 40 mg by mouth daily.     Marland Kitchen OTEZLA 10 & 20 & 30 MG TBPK     . OTEZLA 30 MG TABS Take 1 tablet by mouth 2 (two) times daily.    Marland Kitchen SPIRIVA HANDIHALER 18 MCG inhalation capsule     . triamcinolone cream (KENALOG) 0.1 %     . valACYclovir (VALTREX) 1000 MG tablet     . valACYclovir  (VALTREX) 500 MG tablet Take 500 mg by mouth daily.     Marland Kitchen XIIDRA 5 % SOLN      No current facility-administered medications for this visit.     Musculoskeletal: Strength & Muscle Tone: UTA Gait & Station: UTA Patient leans: N/A  Psychiatric Specialty Exam: Review of Systems  Psychiatric/Behavioral: Negative for agitation, behavioral problems, confusion, decreased concentration, dysphoric mood, hallucinations, self-injury, sleep disturbance and suicidal ideas. The patient is not nervous/anxious and is not hyperactive.        Mood swings  All other systems reviewed and are negative.   There were no vitals taken for this visit.There is no height or weight on file to calculate BMI.  General Appearance: UTA  Eye Contact:  UTA  Speech:  Clear and Coherent  Volume:  Normal  Mood:  Mood swings- improving  Affect:  UTA  Thought Process:  Goal Directed and Descriptions of Associations: Intact  Orientation:  Full (Time, Place, and Person)  Thought Content: Logical   Suicidal Thoughts:  No  Homicidal Thoughts:  No  Memory:  Immediate;   Fair Recent;   Fair Remote;   Fair  Judgement:  Fair  Insight:  Fair  Psychomotor Activity:  UTA  Concentration:  Concentration: Fair and Attention Span: Fair  Recall:  [employer] of Knowledge: Fair  Language: Fair  Akathisia:  No  Handed:  Right  AIMS (if indicated): UTA  Assets:  Communication Skills Desire for Improvement Housing Social Support  ADL's:  Intact  Cognition: WNL  Sleep:  Fair   Screenings: AUDIT     Admission (Discharged) from 12/22/2017 in [place]  Alcohol Use Disorder Identification Test Final Score (AUDIT)  1       Assessment and Plan: Patricia Good is a 59 year old Caucasian female, divorced, on SSD, lives in [place], has a history of  schizoaffective disorder, social anxiety disorder, PTSD, COPD, hyperlipidemia, vitamin B12 deficiency, psoriasis, recent UTI, hypothyroidism, osteoarthritis was  evaluated by phone today.  Patient is currently struggling with mood symptoms however is making progress on the Lamictal.  Patient will benefit from psychotherapy sessions.  Plan as noted below.  Plan Schizoaffective disorder-some progress Abilify 30 mg p.o. daily Lamictal 25 mg p.o. bid. Wellbutrin extended release 300 mg p.o. daily.  PTSD-stable Abilify and Wellbutrin as prescribed.  Tobacco use disorder-improving Provided smoking cessation counseling.  Provided her information for [phone].  Patient advised to call the number and let writer know.  Follow-up in clinic in 3 weeks or sooner if needed.  March 17 at 2:40 PM  I have spent atleast 20 minutes non face to face with patient today. More than 50 % of the time was spent for preparing to see the patient ( e.g., review of test, records ),  ordering medications and test ,psychoeducation and supportive psychotherapy and care coordination,as well as documenting clinical information in electronic health record. This note was generated in part or whole with voice recognition software. Voice recognition is usually quite accurate but there are transcription errors that can and very often do occur. I apologize for any typographical errors that were not detected and corrected.       Ursula Alert, MD 05/31/2019, 12:05 PM

## 2019-05-31 NOTE — Telephone Encounter (Signed)
Received message from Lea that patient wanted to writer to call her back about her concern about her housing.  Attempted to call the patient back, no response.  No voicemail to leave message.

## 2019-06-01 ENCOUNTER — Ambulatory Visit (INDEPENDENT_AMBULATORY_CARE_PROVIDER_SITE_OTHER): Payer: Medicare Other | Admitting: Licensed Clinical Social Worker

## 2019-06-01 DIAGNOSIS — F4310 Post-traumatic stress disorder, unspecified: Secondary | ICD-10-CM

## 2019-06-02 ENCOUNTER — Telehealth: Payer: Self-pay | Admitting: Licensed Clinical Social Worker

## 2019-06-02 ENCOUNTER — Encounter: Payer: Self-pay | Admitting: Licensed Clinical Social Worker

## 2019-06-02 NOTE — Telephone Encounter (Signed)
Therapist called back patient as requested. [place]. Pt would like assistance in obtaining referral for a physician and counselor there when she relocates.
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
## 2019-06-08 ENCOUNTER — Ambulatory Visit (INDEPENDENT_AMBULATORY_CARE_PROVIDER_SITE_OTHER): Payer: Medicare Other | Admitting: Licensed Clinical Social Worker

## 2019-06-08 ENCOUNTER — Telehealth: Payer: Self-pay | Admitting: Psychiatry

## 2019-06-08 ENCOUNTER — Encounter: Payer: Self-pay | Admitting: Licensed Clinical Social Worker

## 2019-06-08 ENCOUNTER — Other Ambulatory Visit: Payer: Self-pay

## 2019-06-08 DIAGNOSIS — F250 Schizoaffective disorder, bipolar type: Secondary | ICD-10-CM | POA: Diagnosis not present

## 2019-06-08 DIAGNOSIS — F4310 Post-traumatic stress disorder, unspecified: Secondary | ICD-10-CM

## 2019-06-08 NOTE — Telephone Encounter (Signed)
Attempted to contact patient again.  Left voicemail.

## 2019-06-08 NOTE — Telephone Encounter (Signed)
Patient called back and I relayed the message put in the chart from the doctor

## 2019-06-08 NOTE — Telephone Encounter (Signed)
Attempted to contact patient, patient not available.  Received message from therapist Ms. Zadie Rhine that patient is having worsening mood symptoms, delusions.  However patient is able to cope and will get help if she is in distress.  Patient will continue to follow-up with therapist.
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
## 2019-06-09 ENCOUNTER — Telehealth: Payer: Self-pay | Admitting: Psychiatry

## 2019-06-09 NOTE — Telephone Encounter (Signed)
Returned call to patient.  She reports she was not sleeping well the past few days however it is getting better now.  She currently does not need any medication changes.  She agrees to go to the nearest emergency department if she is decompensating.  Patient encouraged to keep her appointment with her therapist, educated her about the need for continued psychotherapy sessionsgiven her mood sx    She wants writer to relay to her therapist that she will keep her next appointment.

## 2019-06-12 ENCOUNTER — Emergency Department
Admission: EM | Admit: 2019-06-12 | Discharge: 2019-06-13 | Disposition: A | Discharge status: Home / Self Care | Payer: Medicare Other | Attending: Emergency Medicine | Admitting: Emergency Medicine

## 2019-06-12 ENCOUNTER — Other Ambulatory Visit: Payer: Self-pay

## 2019-06-12 ENCOUNTER — Encounter: Payer: Self-pay | Admitting: Emergency Medicine

## 2019-06-12 DIAGNOSIS — Z79899 Other long term (current) drug therapy: Secondary | ICD-10-CM | POA: Diagnosis not present

## 2019-06-12 DIAGNOSIS — J45909 Unspecified asthma, uncomplicated: Secondary | ICD-10-CM | POA: Diagnosis not present

## 2019-06-12 DIAGNOSIS — F5105 Insomnia due to other mental disorder: Secondary | ICD-10-CM | POA: Diagnosis present

## 2019-06-12 DIAGNOSIS — Z046 Encounter for general psychiatric examination, requested by authority: Secondary | ICD-10-CM | POA: Diagnosis present

## 2019-06-12 DIAGNOSIS — F4310 Post-traumatic stress disorder, unspecified: Secondary | ICD-10-CM | POA: Diagnosis present

## 2019-06-12 DIAGNOSIS — F319 Bipolar disorder, unspecified: Secondary | ICD-10-CM | POA: Diagnosis not present

## 2019-06-12 DIAGNOSIS — F315 Bipolar disorder, current episode depressed, severe, with psychotic features: Secondary | ICD-10-CM | POA: Diagnosis present

## 2019-06-12 DIAGNOSIS — F17210 Nicotine dependence, cigarettes, uncomplicated: Secondary | ICD-10-CM | POA: Insufficient documentation

## 2019-06-12 DIAGNOSIS — F17200 Nicotine dependence, unspecified, uncomplicated: Secondary | ICD-10-CM | POA: Diagnosis present

## 2019-06-12 DIAGNOSIS — G4700 Insomnia, unspecified: Secondary | ICD-10-CM | POA: Diagnosis present

## 2019-06-12 DIAGNOSIS — F1910 Other psychoactive substance abuse, uncomplicated: Secondary | ICD-10-CM | POA: Insufficient documentation

## 2019-06-12 DIAGNOSIS — F4312 Post-traumatic stress disorder, chronic: Secondary | ICD-10-CM | POA: Diagnosis present

## 2019-06-12 DIAGNOSIS — F259 Schizoaffective disorder, unspecified: Secondary | ICD-10-CM | POA: Diagnosis present

## 2019-06-12 DIAGNOSIS — F4010 Social phobia, unspecified: Secondary | ICD-10-CM | POA: Diagnosis present

## 2019-06-12 DIAGNOSIS — F250 Schizoaffective disorder, bipolar type: Secondary | ICD-10-CM | POA: Diagnosis present

## 2019-06-12 DIAGNOSIS — F29 Unspecified psychosis not due to a substance or known physiological condition: Secondary | ICD-10-CM | POA: Diagnosis not present

## 2019-06-12 DIAGNOSIS — I739 Peripheral vascular disease, unspecified: Secondary | ICD-10-CM | POA: Diagnosis present

## 2019-06-12 DIAGNOSIS — M1990 Unspecified osteoarthritis, unspecified site: Secondary | ICD-10-CM | POA: Diagnosis present

## 2019-06-12 DIAGNOSIS — I10 Essential (primary) hypertension: Secondary | ICD-10-CM | POA: Diagnosis present

## 2019-06-12 DIAGNOSIS — Z85820 Personal history of malignant melanoma of skin: Secondary | ICD-10-CM

## 2019-06-12 DIAGNOSIS — N390 Urinary tract infection, site not specified: Secondary | ICD-10-CM | POA: Diagnosis present

## 2019-06-12 NOTE — ED Triage Notes (Signed)
Patient ambulatory to triage with steady gait, without difficulty or distress noted, mask in place; pt reports generalized pain; "I have spirit forms of razor blades and sawblades going in me"; pt denies SI or HI and denies hallucinations but st "not everyone sees spirits"

## 2019-06-13 LAB — URINE DRUG SCREEN, QUALITATIVE (ARMC ONLY)
Amphetamines, Ur Screen: NOT DETECTED
Barbiturates, Ur Screen: NOT DETECTED
Benzodiazepine, Ur Scrn: NOT DETECTED
Cannabinoid 50 Ng, Ur ~~LOC~~: NOT DETECTED
Cocaine Metabolite,Ur ~~LOC~~: NOT DETECTED
MDMA (Ecstasy)Ur Screen: NOT DETECTED
Methadone Scn, Ur: NOT DETECTED
Opiate, Ur Screen: NOT DETECTED
Phencyclidine (PCP) Ur S: NOT DETECTED
Tricyclic, Ur Screen: POSITIVE — AB

## 2019-06-13 LAB — CBC
HCT: 37.7 % (ref 36.0–46.0)
Hemoglobin: 13.1 g/dL (ref 12.0–15.0)
MCH: 35.6 pg — ABNORMAL HIGH (ref 26.0–34.0)
MCHC: 34.7 g/dL (ref 30.0–36.0)
MCV: 102.4 fL — ABNORMAL HIGH (ref 80.0–100.0)
Platelets: 247 10*3/uL (ref 150–400)
RBC: 3.68 MIL/uL — ABNORMAL LOW (ref 3.87–5.11)
RDW: 12.8 % (ref 11.5–15.5)
WBC: 11.1 10*3/uL — ABNORMAL HIGH (ref 4.0–10.5)
nRBC: 0 % (ref 0.0–0.2)

## 2019-06-13 LAB — ETHANOL: Alcohol, Ethyl (B): <10 mg/dL (ref <10)

## 2019-06-13 LAB — COMPREHENSIVE METABOLIC PANEL
ALT: 41 U/L (ref 0–44)
AST: 32 U/L (ref 15–41)
Albumin: 4 g/dL (ref 3.5–5.0)
Alkaline Phosphatase: 149 U/L — ABNORMAL HIGH (ref 38–126)
Anion gap: 11 (ref 5–15)
BUN: 14 mg/dL (ref 8–23)
CO2: 26 mmol/L (ref 22–32)
Calcium: 9 mg/dL (ref 8.9–10.3)
Chloride: 99 mmol/L (ref 98–111)
Creatinine, Ser: 1.31 mg/dL — ABNORMAL HIGH (ref 0.44–1.00)
GFR calc Af Amer: 51 mL/min — ABNORMAL LOW (ref >60)
GFR calc non Af Amer: 44 mL/min — ABNORMAL LOW (ref >60)
Glucose, Bld: 102 mg/dL — ABNORMAL HIGH (ref 70–99)
Potassium: 3.3 mmol/L — ABNORMAL LOW (ref 3.5–5.1)
Sodium: 136 mmol/L (ref 135–145)
Total Bilirubin: 0.6 mg/dL (ref 0.3–1.2)
Total Protein: 7.9 g/dL (ref 6.5–8.1)

## 2019-06-13 LAB — URINALYSIS, COMPLETE (UACMP) WITH MICROSCOPIC
Bacteria, UA: NONE SEEN
Bilirubin Urine: NEGATIVE
Glucose, UA: NEGATIVE mg/dL
Hgb urine dipstick: NEGATIVE
Ketones, ur: NEGATIVE mg/dL
Nitrite: NEGATIVE
Protein, ur: NEGATIVE mg/dL
RBC / HPF: NONE SEEN RBC/hpf (ref 0–5)
Specific Gravity, Urine: <1.005 — ABNORMAL LOW (ref 1.005–1.030)
WBC, UA: NONE SEEN WBC/hpf (ref 0–5)
pH: 7 (ref 5.0–8.0)

## 2019-06-13 LAB — SALICYLATE LEVEL: Salicylate Lvl: <7 mg/dL — ABNORMAL LOW (ref 7.0–30.0)

## 2019-06-13 LAB — ACETAMINOPHEN LEVEL: Acetaminophen (Tylenol), Serum: <10 ug/mL — ABNORMAL LOW (ref 10–30)

## 2019-06-13 NOTE — ED Provider Notes (Signed)
[facility] Emergency Department Provider Note  ____________________________________________  Time seen: Approximately 1:04 AM  I have reviewed the triage vital signs and the nursing notes.   HISTORY  Chief Complaint Mental Health Problem   HPI Patricia Good is a 59 y.o. female with a history of schizoaffective disorder, PTSD, delusions and psychosis who presents for concerns of spirit forms of razor blades in her body.  Patient reports that a few months ago she moved into a room and a friend's house.  The friend and the roommates do drugs.  She reports that she refuses to do drugs with them.  She reports that she keeps seeing spirit forms of razor blades and saw blades going to her body which she thinks it has been sent to her by the roommates. She also hears voices from the spirits but they do not say anything bad to her. She is here today to ask for help with the pain she feels from the razor blades all over her body. She reports that she does not need a psychiatrist as she has a psychiatrist as an outpatient.  She endorses compliance with with her medications.  She denies any suicidal homicidal ideation.  Past Medical History:  Diagnosis Date  . Abnormal thyroid stimulating hormone (TSH) level   . ADHD (attention deficit hyperactivity disorder)   . Anxiety   . Asthma   . PTSD (post-traumatic stress disorder)   . Schizoaffective disorder [facility])     Patient Active Problem List   Diagnosis Date Noted  . Schizoaffective disorder, in remission ([place]) 04/04/2019  . Social anxiety disorder 09/19/2018  . Insomnia 09/19/2018  . Urinary tract infection 12/22/2017  . Schizoaffective disorder, bipolar type ([place]) 12/22/2017  . Bipolar affective disorder, depressed, severe, with psychotic behavior ([place]) 02/12/2017  . PTSD (post-traumatic stress disorder) 09/23/2015  . Arthritis 01/23/2015  . H/O Malignant melanoma 06/15/2014  . Bipolar I disorder ([place]) 09/06/2013   . HTN (hypertension) 09/06/2013  . Peripheral vascular disease ([place]) 09/06/2013  . Tobacco use disorder 09/06/2013    Past Surgical History:  Procedure Laterality Date  . ABDOMINAL HYSTERECTOMY    . ECTOPIC PREGNANCY SURGERY      Prior to Admission medications   Medication Sig Start Date End Date Taking? Authorizing Provider  albuterol (PROAIR HFA) 108 (90 Base) MCG/ACT inhaler Inhale 1-2 puffs into the lungs every 4 (four) hours as needed for wheezing or shortness of breath.     [provider]  amLODipine (NORVASC) 10 MG tablet Take 10 mg by mouth daily.    [provider]  ARIPiprazole (ABILIFY) 30 MG tablet Take 1 tablet (30 mg total) by mouth daily. 05/04/19   Ursula Alert, MD  B Complex-C (B-COMPLEX WITH VITAMIN C) tablet Take 1 tablet by mouth daily.    [provider]  betamethasone dipropionate (DIPROLENE) 0.05 % cream  10/26/18   [provider]  buPROPion (WELLBUTRIN XL) 300 MG 24 hr tablet Take 1 tablet (300 mg total) by mouth daily. 02/07/19   Ursula Alert, MD  calcipotriene (DOVONOX) 0.005 % cream  10/26/18   [provider]  calcium-vitamin D (OSCAL WITH D) 500-200 MG-UNIT tablet Take 1 tablet by mouth daily with breakfast.    [provider]  ciprofloxacin (CIPRO) 500 MG tablet Take 500 mg by mouth 2 (two) times daily. 02/02/19   [provider]  ferrous sulfate 325 (65 FE) MG tablet Take 325 mg by mouth.    [provider]  fluticasone (FLOVENT HFA) 220 MCG/ACT inhaler Inhale 2 puffs 2 (two) times daily into the lungs.    [provider]  furosemide (LASIX) 20 MG tablet Take 20 mg by mouth daily.     [provider]  hydrOXYzine (ATARAX/VISTARIL) 10 MG tablet Take 1 tablet (10 mg total) by mouth 3 (three) times daily as needed. for anxiety 11/02/18   Ursula Alert, MD  lamoTRIgine (LAMICTAL) 25 MG tablet Take 1 tablet (25 mg total) by mouth 2 (two) times daily. 05/29/19   Ursula Alert, MD  levothyroxine (SYNTHROID, LEVOTHROID) 112 MCG tablet  05/04/18   [provider]  metroNIDAZOLE (FLAGYL) 500 MG tablet  07/26/18   [provider]  omeprazole (PRILOSEC) 40 MG capsule Take 40 mg by mouth daily.     [provider]  OTEZLA 10 & 20 & 30 MG TBPK  11/25/18   [provider]  OTEZLA 30 MG TABS Take 1 tablet by mouth 2 (two) times daily. 05/17/19   [provider]  SPIRIVA HANDIHALER 18 MCG inhalation capsule  04/19/18   [provider]  triamcinolone cream (KENALOG) 0.1 %  10/18/18   [provider]  valACYclovir (VALTREX) 1000 MG tablet  10/11/18   [provider]  valACYclovir (VALTREX) 500 MG tablet Take 500 mg by mouth daily.     [provider]  Shirley Friar 5 % SOLN  11/11/18   [provider]    Allergies Shellfish allergy, Sulfa antibiotics, Sulfasalazine, and Lisinopril  Family History  Problem Relation Age of Onset  . Hypertension Father   . Diabetes Father   . Heart disease Father   . Blindness Father   . Obesity Brother   . Arthritis Brother   . Alcohol abuse Brother   . Drug abuse Brother   . Depression Brother   . Alcohol abuse Sister   . Drug abuse Sister   . Anxiety disorder Sister   . Depression Sister   . Breast cancer Neg Hx     Social History Social History   Tobacco Use  . Smoking status: Current Some Day Smoker    Packs/day: 0.25    Types: Cigarettes    Start date: 01/22/1969  . Smokeless tobacco: Never Used  . Tobacco comment: currently 1 cig every two days or so   Substance Use Topics  . Alcohol use: Yes    Alcohol/week: 1.0 - 2.0 standard drinks    Types: 1 Glasses of wine per week  . Drug use: No    Review of Systems  Constitutional: Negative for fever. Eyes: Negative for visual changes. ENT: Negative for sore throat. Neck: No neck pain  Cardiovascular: Negative for chest pain. Respiratory: Negative for shortness of  breath. Gastrointestinal: Negative for abdominal pain, vomiting or diarrhea. Genitourinary: Negative for dysuria. Musculoskeletal: Negative for back pain. Skin: Negative for rash. Neurological: Negative for headaches, weakness or numbness. Psych: No SI or HI. + hallucinations  ____________________________________________   PHYSICAL EXAM:  VITAL SIGNS: ED Triage Vitals [06/12/19 2334]  Enc Vitals Group     BP (!) 147/73     Pulse Rate 95     Resp 18     Temp 98 F (36.7 C)     Temp Source Oral     SpO2 98 %     Weight 218 lb (98.9 kg)     Height 5\' 1"  (1.549 m)     Head Circumference      Peak Flow  Pain Score 4     Pain Loc      Pain Edu?      Excl. in [place]?     Constitutional: Alert and oriented. Well appearing and in no apparent distress. HEENT:      Head: Normocephalic and atraumatic.         Eyes: Conjunctivae are normal. Sclera is non-icteric.       Mouth/Throat: Mucous membranes are moist.       Neck: Supple with no signs of meningismus. Cardiovascular: Regular rate and rhythm.  Respiratory: Normal respiratory effort.  Gastrointestinal: Soft, non tender, and non distended. Musculoskeletal: No edema, cyanosis, or erythema of extremities. Neurologic: Normal speech and language. Face is symmetric. Moving all extremities. No gross focal neurologic deficits are appreciated. Skin: Skin is warm, dry and intact. No rash noted. Psychiatric: Mood and affect are normal. Speech and behavior are delusional. Proper grooming  ____________________________________________   LABS (all labs ordered are listed, but only abnormal results are displayed)  Labs Reviewed  URINALYSIS, COMPLETE (UACMP) WITH MICROSCOPIC - Abnormal; Notable for the following components:      Result Value   Specific Gravity, Urine <1.005 (*)    Leukocytes,Ua TRACE (*)    All other components within normal limits  URINE DRUG SCREEN, QUALITATIVE ([facility] ONLY) - Abnormal; Notable for the following  components:   Tricyclic, Ur Screen POSITIVE (*)    All other components within normal limits  CBC - Abnormal; Notable for the following components:   WBC 11.1 (*)    RBC 3.68 (*)    MCV 102.4 (*)    MCH 35.6 (*)    All other components within normal limits  COMPREHENSIVE METABOLIC PANEL - Abnormal; Notable for the following components:   Potassium 3.3 (*)    Glucose, Bld 102 (*)    Creatinine, Ser 1.31 (*)    Alkaline Phosphatase 149 (*)    GFR calc non Af Amer 44 (*)    GFR calc Af Amer 51 (*)    All other components within normal limits  ACETAMINOPHEN LEVEL - Abnormal; Notable for the following components:   Acetaminophen (Tylenol), Serum <10 (*)    All other components within normal limits  SALICYLATE LEVEL - Abnormal; Notable for the following components:   Salicylate Lvl [id number] (*)    All other components within normal limits  ETHANOL   ____________________________________________  EKG  none  ____________________________________________  RADIOLOGY  none  ____________________________________________   PROCEDURES  Procedure(s) performed: None Procedures Critical Care performed:  None ____________________________________________   INITIAL IMPRESSION / ASSESSMENT AND PLAN / ED COURSE  59 y.o. female with a history of schizoaffective disorder, PTSD, delusions and psychosis who presents for concerns of spirit forms of razor blades in her body.  Patient here with delusions and psychosis associated with her diagnosis of schizoaffective disorder.  She was evaluated by psychiatrist and cleared for discharge with follow-up with her outpatient therapist.  Her labs for medical clearance here did not show any acute findings.  Patient was offered admission to the psychiatric unit but declined.  We discussed standard return precautions with her.      Please note:  Patient was evaluated in Emergency Department today for the symptoms described in the history of present illness.  Patient was evaluated in the context of the global COVID-19 pandemic, which necessitated consideration that the patient might be at risk for infection with the SARS-CoV-2 virus that causes COVID-19. Institutional protocols and algorithms that pertain to the evaluation  of patients at risk for COVID-19 are in a state of rapid change based on information released by regulatory bodies including the CDC and federal and state organizations. These policies and algorithms were followed during the patient's care in the ED.  Some ED evaluations and interventions may be delayed as a result of limited staffing during the pandemic.   As part of my medical decision making, I reviewed the following data within the [place] notes reviewed and incorporated, Labs reviewed , Old chart reviewed, A consult was requested and obtained from this/these consultant(s) psychiatry, Notes from prior ED visits and [place] Controlled Substance Database   ____________________________________________   FINAL CLINICAL IMPRESSION(S) / ED DIAGNOSES   Final diagnoses:  Psychosis, unspecified psychosis type ([place])      NEW MEDICATIONS STARTED DURING THIS VISIT:  ED Discharge Orders    None       Note:  This document was prepared using Dragon voice recognition software and may include unintentional dictation errors.    Alfred Levins, [place], MD 06/13/19 [phone]

## 2019-06-13 NOTE — Consult Note (Signed)
[facility] Face-to-Face Psychiatry Consult   Reason for Consult: Mental health problems Referring Physician:  Dr. Alfred Levins Patient Identification: Patricia Good MRN:  [id number] Principal Diagnosis: Bipolar I disorder ([place]) Diagnosis:  Principal Problem:   Bipolar I disorder ([place]) Active Problems:   H/O Malignant melanoma   HTN (hypertension)   Peripheral vascular disease ([place])   Tobacco use disorder   Arthritis   PTSD (post-traumatic stress disorder)   Bipolar affective disorder, depressed, severe, with psychotic behavior ([place])   Urinary tract infection   Schizoaffective disorder, bipolar type ([place])   Social anxiety disorder   Insomnia   Schizoaffective disorder, in remission ([place])   Total Time spent with patient: 30 minutes  Subjective: "The razor blades feel like it is cutting the inside of me.." Patricia Good is a 59 y.o. female patient presented to [facility] ED via POV voluntarily alone.  The patient voiced she is here today to get help with the pain she feels from the razor blades inside her body.  She sometimes discussed blood is in her urine which she believes she is being cut internally.  The patient voiced she did not come to see a psychiatrist.  She states, "I have my outpatient psychiatrist who recently changed my medications, and I have a therapist I see weekly."  "I need help with the pain that I have." The patient was seen face-to-face by this provider; chart reviewed and consulted with Dr. Alfred Levins on 06/13/2019 due to the patient's care. It was discussed with the EDP that the patient does not meet the criteria to be admitted to the psychiatric inpatient unit.  The patient is alert and oriented x 4, calm and cooperative, and mood-congruent with affect on evaluation. The patient does not appear to be responding to internal or external stimuli. The patient presents with delusional thinking but refused to elaborate any more about the spirits that are shooting razor blades at her. The  patient denies auditory or visual hallucinations, but later on, in the assessment process, she said, "they do not tell me bad things." The patient denies suicidal, homicidal, or self-harm ideations. The patient is presenting with some psychotic and paranoid behaviors, which seem to be her baseline.  During an encounter with the patient, she was able to answer questions appropriately.   Plan: The patient is not a safety risk to self or others and does not require psychiatric inpatient admission for stabilization and treatment.  HPI:  Per Dr. Alfred Levins: Patricia Good is a 59 y.o. female with a history of schizoaffective disorder, PTSD, delusions and psychosis who presents for concerns of spirit forms of razor blades in her body.  Patient reports that a few months ago she moved into a room and a friend's house.  The friend and the roommates do drugs.  She reports that she refuses to do drugs with them.  She reports that she keeps seeing spirit forms of razor blades and saw blades going to her body which she thinks it has been sentto her by the roommates. She also hears voices from the spirits but they do not say anything bad to her. She is here today to ask for help with the pain she feels from the razor blades all over her body. She reports that she does not need a psychiatrist as she has a psychiatrist as an outpatient.  She endorses compliance with her medications.  She denies any suicidal homicidal ideation.  Past Psychiatric History:  ADHD (attention deficit hyperactivity disorder) Anxiety PTSD (  post-traumatic stress disorder) Schizoaffective disorder ([place])  Risk to Self:   No Risk to Others:   No Prior Inpatient Therapy:   Yes Prior Outpatient Therapy:   Yes  Past Medical History:  Past Medical History:  Diagnosis Date  . Abnormal thyroid stimulating hormone (TSH) level   . ADHD (attention deficit hyperactivity disorder)   . Anxiety   . Asthma   . PTSD (post-traumatic stress disorder)   .  Schizoaffective disorder [facility])     Past Surgical History:  Procedure Laterality Date  . ABDOMINAL HYSTERECTOMY    . ECTOPIC PREGNANCY SURGERY     Family History:  Family History  Problem Relation Age of Onset  . Hypertension Father   . Diabetes Father   . Heart disease Father   . Blindness Father   . Obesity Brother   . Arthritis Brother   . Alcohol abuse Brother   . Drug abuse Brother   . Depression Brother   . Alcohol abuse Sister   . Drug abuse Sister   . Anxiety disorder Sister   . Depression Sister   . Breast cancer Neg Hx    Family Psychiatric  History:  Social History:  Social History   Substance and Sexual Activity  Alcohol Use Yes  . Alcohol/week: 1.0 - 2.0 standard drinks  . Types: 1 Glasses of wine per week     Social History   Substance and Sexual Activity  Drug Use No    Social History   Socioeconomic History  . Marital status: Single    Spouse name: Not on file  . Number of children: 0  . Years of education: Not on file  . Highest education level: Associate degree: occupational, Hotel manager, or vocational program  Occupational History  . Not on file  Tobacco Use  . Smoking status: Current Some Day Smoker    Packs/day: 0.25    Types: Cigarettes    Start date: 01/22/1969  . Smokeless tobacco: Never Used  . Tobacco comment: currently 1 cig every two days or so   Substance and Sexual Activity  . Alcohol use: Yes    Alcohol/week: 1.0 - 2.0 standard drinks    Types: 1 Glasses of wine per week  . Drug use: No  . Sexual activity: Not Currently  Other Topics Concern  . Not on file  Social History Narrative  . Not on file   Social Determinants of Health   Financial Resource Strain:   . Difficulty of Paying Living Expenses: Not on file  Food Insecurity:   . Worried About Charity fundraiser in the Last Year: Not on file  . Ran Out of Food in the Last Year: Not on file  Transportation Needs:   . Lack of Transportation (Medical): Not on file   . Lack of Transportation (Non-Medical): Not on file  Physical Activity:   . Days of Exercise per Week: Not on file  . Minutes of Exercise per Session: Not on file  Stress:   . Feeling of Stress : Not on file  Social Connections:   . Frequency of Communication with Friends and Family: Not on file  . Frequency of Social Gatherings with Friends and Family: Not on file  . Attends Religious Services: Not on file  . Active Member of Clubs or Organizations: Not on file  . Attends Archivist Meetings: Not on file  . Marital Status: Not on file   Additional Social History:    Allergies:   Allergies  Allergen Reactions  . Shellfish Allergy Anaphylaxis    Only to crab meat (non-imitation), not shrimp  . Sulfa Antibiotics Hives, Anaphylaxis and Other (See Comments)  . Sulfasalazine Anaphylaxis and Hives    Other reaction(s): Other (See Comments)  . Lisinopril     Labs:  Results for orders placed or performed during the hospital encounter of 06/12/19 (from the past 48 hour(s))  Urinalysis, Complete w Microscopic     Status: Abnormal   Collection Time: 06/12/19 11:41 PM  Result Value Ref Range   Color, Urine YELLOW YELLOW    Comment: YELLOW   APPearance CLEAR CLEAR    Comment: CLEAR   Specific Gravity, Urine <1.005 (L) 1.005 - 1.030   pH 7.0 5.0 - 8.0   Glucose, UA NEGATIVE NEGATIVE mg/dL   Hgb urine dipstick NEGATIVE NEGATIVE   Bilirubin Urine NEGATIVE NEGATIVE   Ketones, ur NEGATIVE NEGATIVE mg/dL   Protein, ur NEGATIVE NEGATIVE mg/dL   Nitrite NEGATIVE NEGATIVE   Leukocytes,Ua TRACE (A) NEGATIVE   Squamous Epithelial / LPF 0-5 0 - 5   WBC, UA NONE SEEN 0 - 5 WBC/hpf   RBC / HPF NONE SEEN 0 - 5 RBC/hpf   Bacteria, UA NONE SEEN NONE SEEN    Comment: Performed at [facility], [address], [place],  [place] [address]  Urine Drug Screen, Qualitative ([facility] only)     Status: Abnormal   Collection Time: 06/12/19 11:41 PM  Result Value Ref Range   Tricyclic,  Ur Screen POSITIVE (A) NONE DETECTED   Amphetamines, Ur Screen NONE DETECTED NONE DETECTED   MDMA (Ecstasy)Ur Screen NONE DETECTED NONE DETECTED   Cocaine Metabolite,Ur [place] NONE DETECTED NONE DETECTED   Opiate, Ur Screen NONE DETECTED NONE DETECTED   Phencyclidine (PCP) Ur S NONE DETECTED NONE DETECTED   Cannabinoid 50 Ng, Ur [place] NONE DETECTED NONE DETECTED   Barbiturates, Ur Screen NONE DETECTED NONE DETECTED   Benzodiazepine, Ur Scrn NONE DETECTED NONE DETECTED   Methadone Scn, Ur NONE DETECTED NONE DETECTED    Comment: (NOTE) Tricyclics + metabolites, urine    Cutoff 1000 ng/mL Amphetamines + metabolites, urine  Cutoff 1000 ng/mL MDMA (Ecstasy), urine              Cutoff 500 ng/mL Cocaine Metabolite, urine          Cutoff 300 ng/mL Opiate + metabolites, urine        Cutoff 300 ng/mL Phencyclidine (PCP), urine         Cutoff 25 ng/mL Cannabinoid, urine                 Cutoff 50 ng/mL Barbiturates + metabolites, urine  Cutoff 200 ng/mL Benzodiazepine, urine              Cutoff 200 ng/mL Methadone, urine                   Cutoff 300 ng/mL The urine drug screen provides only a preliminary, unconfirmed analytical test result and should not be used for non-medical purposes. Clinical consideration and professional judgment should be applied to any positive drug screen result due to possible interfering substances. A more specific alternate chemical method must be used in order to obtain a confirmed analytical result. Gas chromatography / mass spectrometry (GC/MS) is the preferred confirmat ory method. Performed at [facility], [address] [place], [place] [address]   CBC     Status: Abnormal   Collection Time: 06/12/19 11:41 PM  Result  Value Ref Range   WBC 11.1 (H) 4.0 - 10.5 K/uL   RBC 3.68 (L) 3.87 - 5.11 MIL/uL   Hemoglobin 13.1 12.0 - 15.0 g/dL   HCT 37.7 36.0 - 46.0 %   MCV 102.4 (H) 80.0 - 100.0 fL   MCH 35.6 (H) 26.0 - 34.0 pg   MCHC 34.7 30.0 - 36.0 g/dL    RDW 12.8 11.5 - 15.5 %   Platelets 247 150 - 400 K/uL   nRBC 0.0 0.0 - 0.2 %    Comment: Performed at [facility], [address] [place], [place] [address]  Comprehensive metabolic panel     Status: Abnormal   Collection Time: 06/12/19 11:41 PM  Result Value Ref Range   Sodium 136 135 - 145 mmol/L   Potassium 3.3 (L) 3.5 - 5.1 mmol/L   Chloride 99 98 - 111 mmol/L   CO2 26 22 - 32 mmol/L   Glucose, Bld 102 (H) 70 - 99 mg/dL    Comment: Glucose reference range applies only to samples taken after fasting for at least 8 hours.   BUN 14 8 - 23 mg/dL   Creatinine, Ser 1.31 (H) 0.44 - 1.00 mg/dL   Calcium 9.0 8.9 - 10.3 mg/dL   Total Protein 7.9 6.5 - 8.1 g/dL   Albumin 4.0 3.5 - 5.0 g/dL   AST 32 15 - 41 U/L   ALT 41 0 - 44 U/L   Alkaline Phosphatase 149 (H) 38 - 126 U/L   Total Bilirubin 0.6 0.3 - 1.2 mg/dL   GFR calc non Af Amer 44 (L) >60 mL/min   GFR calc Af Amer 51 (L) >60 mL/min   Anion gap 11 5 - 15    Comment: Performed at [facility], [address] [place], [place] [address]  Acetaminophen level     Status: Abnormal   Collection Time: 06/12/19 11:41 PM  Result Value Ref Range   Acetaminophen (Tylenol), Serum <10 (L) 10 - 30 ug/mL    Comment: (NOTE) Therapeutic concentrations vary significantly. A range of 10-30 ug/mL  may be an effective concentration for many patients. However, some  are best treated at concentrations outside of this range. Acetaminophen concentrations >150 ug/mL at 4 hours after ingestion  and >50 ug/mL at 12 hours after ingestion are often associated with  toxic reactions. Performed at [facility], [address] [place], [place] [id number]   Salicylate level     Status: Abnormal   Collection Time: 06/12/19 11:41 PM  Result Value Ref Range   Salicylate Lvl [id number] (L) 7.0 - 30.0 mg/dL    Comment: Performed at [facility], [address] [place], [place] [address]  Ethanol     Status: None   Collection  Time: 06/12/19 11:41 PM  Result Value Ref Range   Alcohol, Ethyl (B) <10 <10 mg/dL    Comment: (NOTE) Lowest detectable limit for serum alcohol is 10 mg/dL. For medical purposes only. Performed at [facility], [address] [place], [place] [address]     No current facility-administered medications for this encounter.   Current Outpatient Medications  Medication Sig Dispense Refill  . albuterol (PROAIR HFA) 108 (90 Base) MCG/ACT inhaler Inhale 1-2 puffs into the lungs every 4 (four) hours as needed for wheezing or shortness of breath.     Marland Kitchen amLODipine (NORVASC) 10 MG tablet Take 10 mg by mouth daily.    . ARIPiprazole (ABILIFY) 30 MG tablet Take 1 tablet (30  mg total) by mouth daily. 90 tablet 1  . B Complex-C (B-COMPLEX WITH VITAMIN C) tablet Take 1 tablet by mouth daily.    . betamethasone dipropionate (DIPROLENE) 0.05 % cream     . buPROPion (WELLBUTRIN XL) 300 MG 24 hr tablet Take 1 tablet (300 mg total) by mouth daily. 90 tablet 1  . calcipotriene (DOVONOX) 0.005 % cream     . calcium-vitamin D (OSCAL WITH D) 500-200 MG-UNIT tablet Take 1 tablet by mouth daily with breakfast.    . ciprofloxacin (CIPRO) 500 MG tablet Take 500 mg by mouth 2 (two) times daily.    . ferrous sulfate 325 (65 FE) MG tablet Take 325 mg by mouth.    . fluticasone (FLOVENT HFA) 220 MCG/ACT inhaler Inhale 2 puffs 2 (two) times daily into the lungs.    . furosemide (LASIX) 20 MG tablet Take 20 mg by mouth daily.     . hydrOXYzine (ATARAX/VISTARIL) 10 MG tablet Take 1 tablet (10 mg total) by mouth 3 (three) times daily as needed. for anxiety 270 tablet 0  . lamoTRIgine (LAMICTAL) 25 MG tablet Take 1 tablet (25 mg total) by mouth 2 (two) times daily. 60 tablet 1  . levothyroxine (SYNTHROID, LEVOTHROID) 112 MCG tablet     . metroNIDAZOLE (FLAGYL) 500 MG tablet     . omeprazole (PRILOSEC) 40 MG capsule Take 40 mg by mouth daily.     Marland Kitchen OTEZLA 10 & 20 & 30 MG TBPK     . OTEZLA 30 MG TABS Take 1  tablet by mouth 2 (two) times daily.    Marland Kitchen SPIRIVA HANDIHALER 18 MCG inhalation capsule     . triamcinolone cream (KENALOG) 0.1 %     . valACYclovir (VALTREX) 1000 MG tablet     . valACYclovir (VALTREX) 500 MG tablet Take 500 mg by mouth daily.     Marland Kitchen XIIDRA 5 % SOLN       Musculoskeletal: Strength & Muscle Tone: within normal limits Gait & Station: normal Patient leans: N/A  Psychiatric Specialty Exam: Physical Exam  Nursing note and vitals reviewed. Constitutional: She is oriented to person, place, and time. She appears well-developed and well-nourished.  Respiratory: Effort normal.  Musculoskeletal:        General: Normal range of motion.     Cervical back: Normal range of motion and neck supple.  Neurological: She is alert and oriented to person, place, and time.  Psychiatric: Her behavior is normal.    Review of Systems  Psychiatric/Behavioral: The patient is nervous/anxious.   All other systems reviewed and are negative.   Blood pressure (!) 147/73, pulse 95, temperature 98 F (36.7 C), temperature source Oral, resp. rate 18, height 5\' 1"  (1.549 m), weight 98.9 kg, SpO2 98 %.Body mass index is 41.19 kg/m.  General Appearance: Casual and Guarded  Eye Contact:  Good  Speech:  Clear and Coherent  Volume:  Normal  Mood:  Anxious  Affect:  Congruent and Flat  Thought Process:  Coherent  Orientation:  Full (Time, Place, and Person)  Thought Content:  WDL, Paranoid Ideation and Rumination  Suicidal Thoughts:  No  Homicidal Thoughts:  No  Memory:  Immediate;   Good Recent;   Good Remote;   Good  Judgement:  Fair  Insight:  Lacking  Psychomotor Activity:  Normal  Concentration:  Concentration: Good and Attention Span: Good  Recall:  Good  Fund of Knowledge:  Good  Language:  Good  Akathisia:  Negative  Handed:  Right  AIMS (if indicated):     Assets:  Communication Skills Desire for Improvement Housing Resilience  ADL's:  Intact  Cognition:  WNL  Sleep:         Treatment Plan Summary: Plan Patient does not meet criteria for psychiatric inpatient admission.  Disposition: No evidence of imminent risk to self or others at present.   Patient does not meet criteria for psychiatric inpatient admission. Supportive therapy provided about ongoing stressors. Discussed crisis plan, support from social network, calling 911, coming to the Emergency Department, and calling Suicide Hotline.  Caroline Sauger, NP 06/13/2019 12:50 AM

## 2019-06-13 NOTE — ED Notes (Signed)
Pt speaking with Kennyth Lose, NP

## 2019-06-13 NOTE — Discharge Instructions (Signed)
You have been seen in the Emergency Department (ED)  today for a psychiatric complaint.  You have been evaluated by psychiatry and we believe you are safe to be discharged from the hospital.   ° °Please return to the Emergency Department (ED)  immediately if you have ANY thoughts of hurting yourself or anyone else, so that we may help you. ° °Please avoid alcohol and drug use. ° °Follow up with your doctor and/or therapist as soon as possible regarding today's ED  visit.  ° °You may call crisis hotline for [place] at [phone]. ° °

## 2019-06-13 NOTE — ED Notes (Signed)
Pt up and sts, "Im going home. I don't need to be here. You can just call me with my lab work." This Therapist, sports and MD Alfred Levins to front lobby to provide pt with her medication she brought in and to ensure that pt did not want to stay.   Kennyth Lose, NP spoke with pt and agreed that pt was okay to leave.   Pt did not sign discharge

## 2019-06-15 ENCOUNTER — Ambulatory Visit (INDEPENDENT_AMBULATORY_CARE_PROVIDER_SITE_OTHER): Payer: Medicare Other | Admitting: Licensed Clinical Social Worker

## 2019-06-15 ENCOUNTER — Other Ambulatory Visit: Payer: Self-pay

## 2019-06-15 ENCOUNTER — Encounter: Payer: Self-pay | Admitting: Licensed Clinical Social Worker

## 2019-06-15 DIAGNOSIS — F4310 Post-traumatic stress disorder, unspecified: Secondary | ICD-10-CM | POA: Diagnosis not present

## 2019-06-15 DIAGNOSIS — F315 Bipolar disorder, current episode depressed, severe, with psychotic features: Secondary | ICD-10-CM | POA: Diagnosis not present

## 2019-06-15 NOTE — Progress Notes (Signed)
Virtual Visit via Telephone Note  I connected with Patricia Good on 06/15/19 at  9:00 AM EST by telephone and verified that I am speaking with the correct person using two identifiers.   I discussed the limitations, risks, security and privacy concerns of performing an evaluation and management service by telephone and the availability of in person appointments. I also discussed with the patient that there may be a patient responsible charge related to this service. The patient expressed understanding and agreed to proceed.   THERAPY PROGRESS NOTE  Session Time: 30 Minutes  Participation Level: Active  Behavioral Response: AlertEuthymic  Type of Therapy: Individual Therapy  Treatment Goals addressed: Coping  Interventions: DBT  Summary: Patricia Good is a 59 y.o. female who presents with PTSD and Bipolar Affective D/O. Pt reported she is feeling much better today and that she feels safe. Pt oriented x5. Pt did not endorse AHV/delusions. Pt reported that she remembered homework assignment and has been practicing since last session. Pt demonstrated understanding of purpose of grounding exercise and identified 5 things she can see in her immediate environment, 4 things she can hear, 3 things she can touch, 2 things she can smell, and 1 thing she can taste. Pt reported goal this week is "relax more, feel better and learn something new". Pt listed ways she can achieve this goal such as engaging in breathing exercises, going for a walk and listening to music.  Suicidal/Homicidal: No  Therapist Response: Therapist met with patient for follow up session. Therapist and patient reviewed homework assignment from last week. Therapist reviewed purpose and benefits of grounding exercise as a mindfulness technique to help patient orient to the present and stay grounded in reality. Therapist encouraged patient to practice the grounding exercise in session. Pt was receptive. Therapist prompted patient to  identify goals for this week and discussed how to effectively implement.   Plan: Return again in 1 week.  Diagnosis: Axis I: Bipolar, Depressed and Post Traumatic Stress Disorder    Axis II: N/A  Josephine Igo, LCSW 06/15/2019

## 2019-06-16 ENCOUNTER — Ambulatory Visit: Payer: Self-pay | Admitting: Urology

## 2019-06-20 ENCOUNTER — Ambulatory Visit: Payer: Medicare Other | Admitting: Licensed Clinical Social Worker

## 2019-06-20 ENCOUNTER — Telehealth: Payer: Self-pay | Admitting: Licensed Clinical Social Worker

## 2019-06-20 ENCOUNTER — Other Ambulatory Visit: Payer: Self-pay

## 2019-06-20 NOTE — Telephone Encounter (Signed)
Therapist attempted to reach patient three times for scheduled therapy appointment. Phone rings, however no one answers and there is no way to leave a message. Therapist attempted phone calls at 8am, 8:07am and 8:31am.

## 2019-06-21 ENCOUNTER — Ambulatory Visit (INDEPENDENT_AMBULATORY_CARE_PROVIDER_SITE_OTHER): Payer: Medicare Other | Admitting: Psychiatry

## 2019-06-21 DIAGNOSIS — Z5329 Procedure and treatment not carried out because of patient's decision for other reasons: Secondary | ICD-10-CM

## 2019-06-21 NOTE — Progress Notes (Signed)
No response to call 

## 2019-07-06 ENCOUNTER — Telehealth: Payer: Self-pay

## 2019-07-06 NOTE — Telephone Encounter (Signed)
I would advise that she wait for Dr. Charlcie Cradle return to discuss the appropriateness of Chantix for smoking cessation. She will be back on Monday. Can you please call and let her know. Thanks

## 2019-07-06 NOTE — Telephone Encounter (Signed)
This is a patient of Dr. Charlcie Cradle. She stated that she used to be on Chantix and would like to go back on it. She has resumed smoking since being off it and would like to quit. She uses [facility] in [place]. She has an appointment scheduled for 07/18/19. Please review and advise. Thank you.

## 2019-07-14 ENCOUNTER — Ambulatory Visit: Payer: Medicare Other | Admitting: Licensed Clinical Social Worker

## 2019-07-18 ENCOUNTER — Encounter: Payer: Self-pay | Admitting: Psychiatry

## 2019-07-18 ENCOUNTER — Other Ambulatory Visit: Payer: Self-pay

## 2019-07-18 ENCOUNTER — Ambulatory Visit (INDEPENDENT_AMBULATORY_CARE_PROVIDER_SITE_OTHER): Payer: Medicare Other | Admitting: Psychiatry

## 2019-07-18 DIAGNOSIS — F4310 Post-traumatic stress disorder, unspecified: Secondary | ICD-10-CM

## 2019-07-18 DIAGNOSIS — F4010 Social phobia, unspecified: Secondary | ICD-10-CM

## 2019-07-18 DIAGNOSIS — F17200 Nicotine dependence, unspecified, uncomplicated: Secondary | ICD-10-CM | POA: Diagnosis not present

## 2019-07-18 DIAGNOSIS — F259 Schizoaffective disorder, unspecified: Secondary | ICD-10-CM

## 2019-07-18 DIAGNOSIS — F5105 Insomnia due to other mental disorder: Secondary | ICD-10-CM

## 2019-07-18 MED ORDER — LAMOTRIGINE 25 MG PO TABS: 25.0000 mg | ORAL_TABLET | Freq: Two times a day (BID) | ORAL | 0 refills | Status: DC

## 2019-07-18 MED ORDER — BUPROPION HCL ER (XL) 300 MG PO TB24: 300.0000 mg | ORAL_TABLET | Freq: Every day | ORAL | 0 refills | Status: DC

## 2019-07-18 MED ORDER — ZOLPIDEM TARTRATE 5 MG PO TABS: 5.0000 mg | ORAL_TABLET | Freq: Every evening | ORAL | 1 refills | Status: DC | PRN
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
## 2019-08-23 ENCOUNTER — Telehealth: Payer: Self-pay

## 2019-08-23 DIAGNOSIS — F250 Schizoaffective disorder, bipolar type: Secondary | ICD-10-CM

## 2019-08-23 DIAGNOSIS — F4310 Post-traumatic stress disorder, unspecified: Secondary | ICD-10-CM

## 2019-08-23 MED ORDER — ARIPIPRAZOLE 30 MG PO TABS: 30.0000 mg | ORAL_TABLET | Freq: Every day | ORAL | 0 refills | Status: DC

## 2019-08-23 NOTE — Telephone Encounter (Signed)
pt needs another refill on her sleep medication pt states that she is suppose to find out monday if she can get an appt with someone where she lives at now.  but she only has a few pills left .

## 2019-08-23 NOTE — Telephone Encounter (Signed)
I have sent Abilify to her pharmacy however Ambien she has 1 more refill available she has not picked it up yet.

## 2019-10-16 ENCOUNTER — Ambulatory Visit: Payer: Self-pay | Admitting: Dermatology

## 2020-04-29 ENCOUNTER — Telehealth (INDEPENDENT_AMBULATORY_CARE_PROVIDER_SITE_OTHER): Payer: 59 | Admitting: Psychiatry

## 2020-04-29 ENCOUNTER — Other Ambulatory Visit: Payer: Self-pay

## 2020-04-29 ENCOUNTER — Encounter: Payer: Self-pay | Admitting: Psychiatry

## 2020-04-29 VITALS — BP 143/83 | HR 89 | Temp 98.0°F

## 2020-04-29 DIAGNOSIS — F17200 Nicotine dependence, unspecified, uncomplicated: Secondary | ICD-10-CM

## 2020-04-29 DIAGNOSIS — F4010 Social phobia, unspecified: Secondary | ICD-10-CM

## 2020-04-29 DIAGNOSIS — F250 Schizoaffective disorder, bipolar type: Secondary | ICD-10-CM

## 2020-04-29 DIAGNOSIS — F4310 Post-traumatic stress disorder, unspecified: Secondary | ICD-10-CM | POA: Diagnosis not present

## 2020-04-29 DIAGNOSIS — F5105 Insomnia due to other mental disorder: Secondary | ICD-10-CM | POA: Diagnosis not present

## 2020-04-29 DIAGNOSIS — Z79899 Other long term (current) drug therapy: Secondary | ICD-10-CM | POA: Insufficient documentation

## 2020-04-29 MED ORDER — ARIPIPRAZOLE 30 MG PO TABS: 30.0000 mg | ORAL_TABLET | Freq: Every day | ORAL | 0 refills | Status: DC

## 2020-04-29 MED ORDER — BUPROPION HCL ER (XL) 300 MG PO TB24: 300.0000 mg | ORAL_TABLET | Freq: Every day | ORAL | 0 refills | Status: DC

## 2020-04-29 NOTE — Progress Notes (Signed)
[place] [facility] MD OP Progress Note  04/29/2020 11:19 AM LILLION ORCUTT  MRN:  [id number]  Chief Complaint:  Chief Complaint    Follow-up     HPI: Patricia Good is a 60 year old female, divorced, has a history of schizoaffective disorder, social anxiety disorder, PTSD, COPD, vitamin B12 deficiency, psoriasis, history of malignant melanoma, hypothyroidism, osteoarthritis was evaluated in the office in person today.  Patient today reports she recently relocated back to [place].  She had moved to [place] previously.  She reports she did not feel that went too well and decided to come back to be closer to family.  She reports her current living arrangement as good.  Patient reports she was seeing a provider at [facility] however she did not continue any of the medications that the new provider started her on.  She currently takes Abilify 30 mg and Wellbutrin XL 300 mg daily.  She is no longer on the Lamictal.  She reports this combination of medication as beneficial.  Patient denies any mood swings.  She denies any depression or anxiety.  She reports sleep is good.  She did not appear to be preoccupied with any delusions.  She denies any other perceptual disturbances.  Patient denies side effects to medications.  Patient reports she is currently cutting back on smoking cigarettes.  She denies any other concerns today.  Visit Diagnosis:    ICD-10-CM   1. Schizoaffective disorder, bipolar type ([place])  F25.0 ARIPiprazole (ABILIFY) 30 MG tablet  2. PTSD (post-traumatic stress disorder)  F43.10 buPROPion (WELLBUTRIN XL) 300 MG 24 hr tablet  3. Social anxiety disorder  F40.10   4. Insomnia due to mental condition  F51.05   5. Tobacco use disorder  F17.200   6. High risk medication use  Z79.899 Lipid panel    Hemoglobin A1C    Prolactin    TSH    Past Psychiatric History: I have reviewed past psychiatric history from my progress note from 07/16/2017.  Past trials of Prozac,  trazodone.  Past Medical History:  Past Medical History:  Diagnosis Date  . Abnormal thyroid stimulating hormone (TSH) level   . ADHD (attention deficit hyperactivity disorder)   . Anxiety   . Asthma   . Diabetes mellitus, type II ([place])   . PTSD (post-traumatic stress disorder)   . Schizoaffective disorder [facility])     Past Surgical History:  Procedure Laterality Date  . ABDOMINAL HYSTERECTOMY    . ECTOPIC PREGNANCY SURGERY      Family Psychiatric History: I have reviewed family psychiatric history from my progress note from 07/16/2017  Family History:  Family History  Problem Relation Age of Onset  . Hypertension Father   . Diabetes Father   . Heart disease Father   . Blindness Father   . Obesity Brother   . Arthritis Brother   . Alcohol abuse Brother   . Drug abuse Brother   . Depression Brother   . Alcohol abuse Sister   . Drug abuse Sister   . Anxiety disorder Sister   . Depression Sister   . Breast cancer Neg Hx     Social History: Reviewed social history from my progress note on 07/16/2017. Social History   Socioeconomic History  . Marital status: Single    Spouse name: Not on file  . Number of children: 0  . Years of education: Not on file  . Highest education level: Associate degree: occupational, Hotel manager, or vocational program  Occupational History  . Not  on file  Tobacco Use  . Smoking status: Current Some Day Smoker    Packs/day: 0.50    Types: Cigarettes    Start date: 01/22/1969  . Smokeless tobacco: Never Used  . Tobacco comment: half pack daily reported 04/29/2020  Vaping Use  . Vaping Use: Never used  Substance and Sexual Activity  . Alcohol use: Yes    Alcohol/week: 1.0 - 2.0 standard drink    Types: 1 Glasses of wine per week  . Drug use: No  . Sexual activity: Not Currently  Other Topics Concern  . Not on file  Social History Narrative  . Not on file   Social Determinants of Health   Financial Resource Strain: Not on file  Food  Insecurity: Not on file  Transportation Needs: Not on file  Physical Activity: Not on file  Stress: Not on file  Social Connections: Not on file    Allergies:  Allergies  Allergen Reactions  . Shellfish Allergy Anaphylaxis    Only to crab meat (non-imitation), not shrimp  . Sulfa Antibiotics Hives, Anaphylaxis and Other (See Comments)  . Sulfasalazine Anaphylaxis and Hives    Other reaction(s): Other (See Comments)  . Lisinopril     Metabolic Disorder Labs: Lab Results  Component Value Date   HGBA1C 5.7 (H) 12/21/2017   MPG 117 12/21/2017   Lab Results  Component Value Date   PROLACTIN 1.2 (L) 07/12/2017   Lab Results  Component Value Date   CHOL 241 (H) 12/21/2017   TRIG 115 12/21/2017   HDL 55 12/21/2017   CHOLHDL 4.4 12/21/2017   VLDL 23 12/21/2017   LDLCALC 163 (H) 12/21/2017   LDLCALC 174 (H) 07/12/2017   Lab Results  Component Value Date   TSH 0.199 (L) 12/21/2017   TSH 0.465 07/12/2017    Therapeutic Level Labs: No results found for: LITHIUM No results found for: VALPROATE No components found for:  CBMZ  Current Medications: Current Outpatient Medications  Medication Sig Dispense Refill  . albuterol (PROAIR HFA) 108 (90 Base) MCG/ACT inhaler Inhale 1-2 puffs into the lungs every 4 (four) hours as needed for wheezing or shortness of breath.     Marland Kitchen amLODipine (NORVASC) 10 MG tablet Take 10 mg by mouth daily.    . ARIPiprazole (ABILIFY) 30 MG tablet Take 1 tablet (30 mg total) by mouth daily. 90 tablet 0  . atorvastatin (LIPITOR) 20 MG tablet Take 20 mg by mouth at bedtime.    . B Complex-C (B-COMPLEX WITH VITAMIN C) tablet Take 1 tablet by mouth daily.    . betamethasone dipropionate (DIPROLENE) 0.05 % cream     . buPROPion (WELLBUTRIN XL) 300 MG 24 hr tablet Take 1 tablet (300 mg total) by mouth daily. 90 tablet 0  . calcipotriene (DOVONOX) 0.005 % cream     . calcium-vitamin D (OSCAL WITH D) 500-200 MG-UNIT tablet Take 1 tablet by mouth daily with  breakfast.    . ciprofloxacin (CIPRO) 500 MG tablet Take 500 mg by mouth 2 (two) times daily.    . ferrous sulfate 325 (65 FE) MG tablet Take 325 mg by mouth.    . fluticasone (FLOVENT HFA) 220 MCG/ACT inhaler Inhale 2 puffs 2 (two) times daily into the lungs.    . furosemide (LASIX) 20 MG tablet Take 20 mg by mouth daily.     . hydrOXYzine (ATARAX/VISTARIL) 10 MG tablet Take 1 tablet (10 mg total) by mouth 3 (three) times daily as needed. for anxiety 270 tablet 0  .  lamoTRIgine (LAMICTAL) 25 MG tablet Take 1 tablet (25 mg total) by mouth 2 (two) times daily. 180 tablet 0  . levothyroxine (SYNTHROID, LEVOTHROID) 112 MCG tablet     . metroNIDAZOLE (FLAGYL) 500 MG tablet     . omeprazole (PRILOSEC) 40 MG capsule Take 40 mg by mouth daily.     Marland Kitchen OTEZLA 10 & 20 & 30 MG TBPK     . OTEZLA 30 MG TABS Take 1 tablet by mouth 2 (two) times daily.    Marland Kitchen SPIRIVA HANDIHALER 18 MCG inhalation capsule     . triamcinolone cream (KENALOG) 0.1 %     . valACYclovir (VALTREX) 1000 MG tablet     . valACYclovir (VALTREX) 500 MG tablet Take 500 mg by mouth daily.     Marland Kitchen XIIDRA 5 % SOLN      No current facility-administered medications for this visit.     Musculoskeletal: Strength & Muscle Tone: UTA Gait & Station: normal Patient leans: N/A  Psychiatric Specialty Exam: Review of Systems  Psychiatric/Behavioral: The patient is nervous/anxious.   All other systems reviewed and are negative.   Blood pressure (!) 143/83, pulse 89, temperature 98 F (36.7 C), temperature source Oral.There is no height or weight on file to calculate BMI.  General Appearance: Casual  Eye Contact:  Fair  Speech:  Clear and Coherent  Volume:  Normal  Mood:  Anxious Coping well  Affect:  Congruent  Thought Process:  Goal Directed and Descriptions of Associations: Intact  Orientation:  Full (Time, Place, and Person)  Thought Content: Logical   Suicidal Thoughts:  No  Homicidal Thoughts:  No  Memory:  Immediate;    Fair Recent;   Fair Remote;   Fair  Judgement:  Fair  Insight:  Shallow  Psychomotor Activity:  Normal  Concentration:  Concentration: Fair and Attention Span: Fair  Recall:  [employer] of Knowledge: Fair  Language: Fair  Akathisia:  No  Handed:  Right  AIMS (if indicated): 0  Assets:  Communication Skills Desire for Improvement Housing Talents/Skills Transportation  ADL's:  Intact  Cognition: WNL  Sleep:  Fair   Screenings: AUDIT   Flowsheet Row Admission (Discharged) from 12/22/2017 in [facility]  Alcohol Use Disorder Identification Test Final Score (AUDIT) 1       Assessment and Plan: MIRTHA CRUISE is a 60 year old Caucasian female, divorced, on SSD, currently lives in [place], has a history of schizoaffective disorder, social anxiety disorder, PTSD, COPD, hyperlipidemia, vitamin B12 deficiency, psoriasis, recent UTI, hypothyroidism, osteoarthritis was evaluated in the office today.  Patient is currently stable on medications and did not appear to be preoccupied with delusions.  Plan as noted below.  Plan Schizoaffective disorder-stable Abilify 30 mg p.o. daily Wellbutrin extended release 300 mg p.o. daily Discontinue Lamictal for noncompliance  PTSD-stable Abilify and Wellbutrin as prescribed  Tobacco use disorder-improving Patient provided counseling.  High risk medication use-we will order the following labs-TSH, lipid panel, hemoglobin A1c, prolactin.  Lab slip provided to patient to take to [facility] lab.  Patient to follow-up with her primary care provider for her elevated blood pressure reading.  Follow-up in clinic in 2 months or sooner if needed.    I have spent atleast 30 minutes  with patient today. More than 50 % of the time was spent for preparing to see the patient ( e.g., review of test, records ), obtaining and to review and separately obtained history , ordering medications and test ,psychoeducation and supportive  psychotherapy and care coordination,as well as documenting clinical information in electronic health record. This note was generated in part or whole with voice recognition software. Voice recognition is usually quite accurate but there are transcription errors that can and very often do occur. I apologize for any typographical errors that were not detected and corrected.        Ursula Alert, MD 04/29/2020, 11:19 AM

## 2020-05-06 ENCOUNTER — Other Ambulatory Visit
Admission: RE | Admit: 2020-05-06 | Discharge: 2020-05-06 | Disposition: A | Discharge status: Home / Self Care | Payer: 59 | Attending: Psychiatry | Admitting: Psychiatry

## 2020-05-06 DIAGNOSIS — Z79899 Other long term (current) drug therapy: Secondary | ICD-10-CM | POA: Insufficient documentation

## 2020-05-06 LAB — LIPID PANEL
Cholesterol: 193 mg/dL (ref 0–200)
HDL: 56 mg/dL (ref >40)
LDL Cholesterol: 116 mg/dL — ABNORMAL HIGH (ref 0–99)
Total CHOL/HDL Ratio: 3.4 RATIO
Triglycerides: 103 mg/dL (ref <150)
VLDL: 21 mg/dL (ref 0–40)

## 2020-05-06 LAB — HEMOGLOBIN A1C
Hgb A1c MFr Bld: 5.7 % — ABNORMAL HIGH (ref 4.8–5.6)
Mean Plasma Glucose: 116.89 mg/dL

## 2020-05-06 LAB — TSH: TSH: 2.143 u[IU]/mL (ref 0.350–4.500)

## 2020-05-07 LAB — PROLACTIN: Prolactin: 1.3 ng/mL — ABNORMAL LOW (ref 4.8–23.3)

## 2020-06-26 ENCOUNTER — Telehealth: Payer: Self-pay

## 2020-06-26 NOTE — Telephone Encounter (Signed)
Patient has enough Abilify and wellbutrin till April 24. She will need appointment .

## 2020-06-26 NOTE — Telephone Encounter (Signed)
pt called states she needs a refill on her medications

## 2020-06-26 NOTE — Telephone Encounter (Signed)
called pt back and advised her to make an appt pt states that she cant do virtual and you told her no more phone call visit.

## 2020-06-27 ENCOUNTER — Telehealth: Payer: 59 | Admitting: Psychiatry

## 2020-06-27 NOTE — Telephone Encounter (Signed)
Pt was called and told that she should have enough medication until April 24th. Pt was transferred to front desk to make an appt.

## 2020-07-01 ENCOUNTER — Telehealth: Payer: Self-pay

## 2020-07-01 DIAGNOSIS — F250 Schizoaffective disorder, bipolar type: Secondary | ICD-10-CM

## 2020-07-01 DIAGNOSIS — F4310 Post-traumatic stress disorder, unspecified: Secondary | ICD-10-CM

## 2020-07-01 NOTE — Telephone Encounter (Signed)
pt called states she needs something to help her sleep

## 2020-07-02 MED ORDER — HYDROXYZINE HCL 10 MG PO TABS: 20.0000 mg | ORAL_TABLET | Freq: Every evening | ORAL | 0 refills | Status: DC | PRN

## 2020-07-02 NOTE — Telephone Encounter (Signed)
Returned call to patient.  She reports she has not taken hydroxyzine in a long time.  It may have helped her with sleep in the past.  Discussed taking hydroxyzine 20 mg at bedtime as needed for sleep.  She will call back with concerns.

## 2020-07-02 NOTE — Telephone Encounter (Signed)
pt called ask if hydroxyzine has bee called in.

## 2020-07-09 ENCOUNTER — Telehealth: Payer: Self-pay

## 2020-07-09 NOTE — Telephone Encounter (Signed)
Medication refill request - Telephone call with pt after she left a message stating she has 1 Meloxicam left and questioning if Dr. Shea Evans would fill this. Reminded of appt 07/11/20. Questioned who had prescribed this for patient in the past as this was not something typically ordered by Dr. Shea Evans.  Informed patient should could discuss it with her but questioned again who prescribed it.  Patient left the phone to go grab her bottle and then phone disconnected.  Attempted to contact patient back but no answer.

## 2020-07-11 ENCOUNTER — Ambulatory Visit (INDEPENDENT_AMBULATORY_CARE_PROVIDER_SITE_OTHER): Payer: 59 | Admitting: Psychiatry

## 2020-07-11 ENCOUNTER — Other Ambulatory Visit: Payer: Self-pay

## 2020-07-11 ENCOUNTER — Encounter: Payer: Self-pay | Admitting: Psychiatry

## 2020-07-11 VITALS — BP 120/78 | HR 91 | Temp 97.4°F | Wt 207.6 lb

## 2020-07-11 DIAGNOSIS — F250 Schizoaffective disorder, bipolar type: Secondary | ICD-10-CM | POA: Diagnosis not present

## 2020-07-11 DIAGNOSIS — F4010 Social phobia, unspecified: Secondary | ICD-10-CM | POA: Diagnosis not present

## 2020-07-11 DIAGNOSIS — Z79899 Other long term (current) drug therapy: Secondary | ICD-10-CM

## 2020-07-11 DIAGNOSIS — F5105 Insomnia due to other mental disorder: Secondary | ICD-10-CM

## 2020-07-11 DIAGNOSIS — F4310 Post-traumatic stress disorder, unspecified: Secondary | ICD-10-CM | POA: Diagnosis not present

## 2020-07-11 DIAGNOSIS — F17200 Nicotine dependence, unspecified, uncomplicated: Secondary | ICD-10-CM

## 2020-07-11 MED ORDER — BUPROPION HCL ER (XL) 300 MG PO TB24: 300.0000 mg | ORAL_TABLET | Freq: Every day | ORAL | 0 refills | Status: DC

## 2020-07-11 MED ORDER — ARIPIPRAZOLE 30 MG PO TABS: 30.0000 mg | ORAL_TABLET | Freq: Every day | ORAL | 0 refills | Status: DC

## 2020-07-11 NOTE — Progress Notes (Signed)
[facility] MD OP Progress Note  07/11/2020 11:30 AM CONCHITA TRUXILLO  MRN:  [id number]  Chief Complaint:  Chief Complaint    Follow-up; Depression     HPI: Patricia Good is a 60 year old female, divorced, has a history of schizoaffective disorder, social anxiety disorder, PTSD, COPD, vitamin B12 deficiency, psoriasis, history of malignant melanoma, hypothyroidism, osteoarthritis was evaluated in office in person today.  Patient today reports she currently has psychosocial stressors of housing problems.  She reports her next-door neighbor abuses cocaine and there is smoke all over the hallway when she passes by.  She reports it is a constant stressor for her.  She currently is looking to move into another apartment.  She reports she has applied to [facility] which is under [employer].  She reports she is currently on a waiting list and has to call them back end of April.  Patient however reports she is coping with her anxiety okay and it does not overwhelm her too much.  She reports sleep is overall okay.  She is currently taking hydroxyzine which has been very helpful.  She is compliant on her medications.  Denies side effects.  Patient denies any perceptual disturbances.  She did not appear to be preoccupied with any delusions.  Patient denies any suicidality, homicidality.  Patient denies any other concerns.  Visit Diagnosis:    ICD-10-CM   1. Schizoaffective disorder, bipolar type ([place])  F25.0 ARIPiprazole (ABILIFY) 30 MG tablet  2. PTSD (post-traumatic stress disorder)  F43.10 buPROPion (WELLBUTRIN XL) 300 MG 24 hr tablet  3. Social anxiety disorder  F40.10   4. Insomnia due to mental condition  F51.05   5. Tobacco use disorder  F17.200   6. High risk medication use  Z79.899     Past Psychiatric History: I have reviewed past psychiatric history from my progress note on 07/16/2017.  Past trials of Prozac, trazodone  Past Medical History:  Past Medical History:  Diagnosis Date  . Abnormal  thyroid stimulating hormone (TSH) level   . ADHD (attention deficit hyperactivity disorder)   . Anxiety   . Asthma   . Diabetes mellitus, type II ([place])   . PTSD (post-traumatic stress disorder)   . Schizoaffective disorder [facility])     Past Surgical History:  Procedure Laterality Date  . ABDOMINAL HYSTERECTOMY    . ECTOPIC PREGNANCY SURGERY      Family Psychiatric History: I have reviewed family psychiatric history from my progress note on 07/16/2017  Family History:  Family History  Problem Relation Age of Onset  . Hypertension Father   . Diabetes Father   . Heart disease Father   . Blindness Father   . Obesity Brother   . Arthritis Brother   . Alcohol abuse Brother   . Drug abuse Brother   . Depression Brother   . Alcohol abuse Sister   . Drug abuse Sister   . Anxiety disorder Sister   . Depression Sister   . Breast cancer Neg Hx     Social History: I have reviewed social history from my progress note on 07/16/2017 Social History   Socioeconomic History  . Marital status: Single    Spouse name: Not on file  . Number of children: 0  . Years of education: Not on file  . Highest education level: Associate degree: occupational, Hotel manager, or vocational program  Occupational History  . Not on file  Tobacco Use  . Smoking status: Current Some Day Smoker    Packs/day: 0.50  Types: Cigarettes    Start date: 01/22/1969  . Smokeless tobacco: Never Used  . Tobacco comment: half pack daily reported 04/29/2020  Vaping Use  . Vaping Use: Never used  Substance and Sexual Activity  . Alcohol use: Yes    Alcohol/week: 1.0 - 2.0 standard drink    Types: 1 Glasses of wine per week  . Drug use: No  . Sexual activity: Not Currently  Other Topics Concern  . Not on file  Social History Narrative  . Not on file   Social Determinants of Health   Financial Resource Strain: Not on file  Food Insecurity: Not on file  Transportation Needs: Not on file  Physical Activity: Not  on file  Stress: Not on file  Social Connections: Not on file    Allergies:  Allergies  Allergen Reactions  . Shellfish Allergy Anaphylaxis    Only to crab meat (non-imitation), not shrimp  . Sulfa Antibiotics Hives, Anaphylaxis and Other (See Comments)  . Sulfasalazine Anaphylaxis and Hives    Other reaction(s): Other (See Comments)  . Lisinopril     Metabolic Disorder Labs: Lab Results  Component Value Date   HGBA1C 5.7 (H) 05/06/2020   MPG 116.89 05/06/2020   MPG 117 12/21/2017   Lab Results  Component Value Date   PROLACTIN 1.3 (L) 05/06/2020   PROLACTIN 1.2 (L) 07/12/2017   Lab Results  Component Value Date   CHOL 193 05/06/2020   TRIG 103 05/06/2020   HDL 56 05/06/2020   CHOLHDL 3.4 05/06/2020   VLDL 21 05/06/2020   LDLCALC 116 (H) 05/06/2020   LDLCALC 163 (H) 12/21/2017   Lab Results  Component Value Date   TSH 2.143 05/06/2020   TSH 0.199 (L) 12/21/2017    Therapeutic Level Labs: No results found for: LITHIUM No results found for: VALPROATE No components found for:  CBMZ  Current Medications: Current Outpatient Medications  Medication Sig Dispense Refill  . albuterol (PROAIR HFA) 108 (90 Base) MCG/ACT inhaler Inhale 1-2 puffs into the lungs every 4 (four) hours as needed for wheezing or shortness of breath.     Marland Kitchen amLODipine (NORVASC) 10 MG tablet Take 10 mg by mouth daily.    Marland Kitchen atorvastatin (LIPITOR) 20 MG tablet Take 20 mg by mouth at bedtime.    . B Complex-C (B-COMPLEX WITH VITAMIN C) tablet Take 1 tablet by mouth daily.    . calcium-vitamin D (OSCAL WITH D) 500-200 MG-UNIT tablet Take 1 tablet by mouth daily with breakfast.    . ferrous sulfate 325 (65 FE) MG tablet Take 325 mg by mouth.    . fluticasone (FLOVENT HFA) 220 MCG/ACT inhaler Inhale 2 puffs 2 (two) times daily into the lungs.    . furosemide (LASIX) 20 MG tablet Take 20 mg by mouth daily.     . hydrOXYzine (ATARAX/VISTARIL) 10 MG tablet Take 2 tablets (20 mg total) by mouth at  bedtime as needed. for sleep and anxiety 180 tablet 0  . lamoTRIgine (LAMICTAL) 25 MG tablet Take 1 tablet (25 mg total) by mouth 2 (two) times daily. 180 tablet 0  . levothyroxine (SYNTHROID, LEVOTHROID) 112 MCG tablet     . meloxicam (MOBIC) 15 MG tablet Take 1 tablet by mouth daily.    . metroNIDAZOLE (FLAGYL) 500 MG tablet     . omeprazole (PRILOSEC) 40 MG capsule Take 40 mg by mouth daily.     Marland Kitchen OTEZLA 30 MG TABS Take 1 tablet by mouth 2 (two) times daily.    Marland Kitchen  SPIRIVA HANDIHALER 18 MCG inhalation capsule     . triamcinolone cream (KENALOG) 0.1 %     . valACYclovir (VALTREX) 1000 MG tablet     . XIIDRA 5 % SOLN     . ARIPiprazole (ABILIFY) 30 MG tablet Take 1 tablet (30 mg total) by mouth daily. 90 tablet 0  . buPROPion (WELLBUTRIN XL) 300 MG 24 hr tablet Take 1 tablet (300 mg total) by mouth daily. 90 tablet 0   No current facility-administered medications for this visit.     Musculoskeletal: Strength & Muscle Tone: UTA Gait & Station: normal Patient leans: N/A  Psychiatric Specialty Exam: Review of Systems  Psychiatric/Behavioral: The patient is nervous/anxious.   All other systems reviewed and are negative.   Blood pressure 120/78, pulse 91, temperature (!) 97.4 F (36.3 C), temperature source Temporal, weight 207 lb 9.6 oz (94.2 kg).Body mass index is 39.23 kg/m.  General Appearance: Casual  Eye Contact:  Fair  Speech:  Clear and Coherent  Volume:  Normal  Mood:  Anxious  Affect:  Congruent  Thought Process:  Goal Directed and Descriptions of Associations: Intact  Orientation:  Full (Time, Place, and Person)  Thought Content: Logical   Suicidal Thoughts:  No  Homicidal Thoughts:  No  Memory:  Immediate;   Fair Recent;   Fair Remote;   Fair  Judgement:  Fair  Insight:  Fair  Psychomotor Activity:  Normal  Concentration:  Concentration: Fair and Attention Span: Fair  Recall:  [employer] of Knowledge: Fair  Language: Fair  Akathisia:  No  Handed:  Right   AIMS (if indicated): done  Assets:  Communication Skills Desire for Improvement Housing Social Support  ADL's:  Intact  Cognition: WNL  Sleep:  Fair   Screenings: AUDIT   Flowsheet Row Admission (Discharged) from 12/22/2017 in [facility]  Alcohol Use Disorder Identification Test Final Score (AUDIT) 1    PHQ2-9   [place] Office Visit from 07/11/2020 in [facility]  PHQ-2 Total Score 4  PHQ-9 Total Score 8    [place] Visit from 07/11/2020 in [facility] No Risk       Assessment and Plan: TAKYLA KUCHERA is a 60 year old Caucasian female, divorced, on SSD, currently lives in [place], has a history of schizoaffective disorder, social anxiety disorder, PTSD, COPD, hyperlipidemia, vitamin B12 deficiency, psoriasis, recent UTI, hypothyroidism, osteoarthritis was evaluated in office today.  Patient is currently stable on medications.  Plan as noted below.  Plan Schizoaffective disorder-stable Abilify 30 mg p.o. daily Wellbutrin XL 300 mg p.o. daily  PTSD-stable Abilify and Wellbutrin as prescribed Continue hydroxyzine 10 mg-take 2 tablets by mouth at bedtime as needed for sleep and anxiety.  Tobacco use disorder-improving Provided counseling. Patient is interested in nicotine lozenges and will get it over-the-counter.  High risk medication use-reviewed and discussed the following labs-lipid panel-within normal limits except for LDL slightly elevated at 116, hemoglobin A1c elevated at 5.7, prolactin-low at 1.3, TSH-within normal limits at 2.143-all labs dated 05/06/2020.  Follow-up in clinic in person in 2 to 3 months.  This note was generated in part or whole with voice recognition software. Voice recognition is usually quite accurate but there are transcription errors that can and very often do occur. I apologize for any typographical errors that were not  detected and corrected.       Ursula Alert, MD 07/11/2020, 11:30 AM

## 2020-09-10 ENCOUNTER — Telehealth (INDEPENDENT_AMBULATORY_CARE_PROVIDER_SITE_OTHER): Payer: 59 | Admitting: Psychiatry

## 2020-09-10 ENCOUNTER — Other Ambulatory Visit: Payer: Self-pay

## 2020-09-10 ENCOUNTER — Encounter: Payer: Self-pay | Admitting: Psychiatry

## 2020-09-10 DIAGNOSIS — F5105 Insomnia due to other mental disorder: Secondary | ICD-10-CM | POA: Diagnosis not present

## 2020-09-10 DIAGNOSIS — F4010 Social phobia, unspecified: Secondary | ICD-10-CM | POA: Diagnosis not present

## 2020-09-10 DIAGNOSIS — F4310 Post-traumatic stress disorder, unspecified: Secondary | ICD-10-CM

## 2020-09-10 DIAGNOSIS — F17200 Nicotine dependence, unspecified, uncomplicated: Secondary | ICD-10-CM

## 2020-09-10 DIAGNOSIS — F250 Schizoaffective disorder, bipolar type: Secondary | ICD-10-CM

## 2020-09-10 MED ORDER — ARIPIPRAZOLE 30 MG PO TABS: 30.0000 mg | ORAL_TABLET | Freq: Every day | ORAL | 0 refills | Status: DC

## 2020-09-10 MED ORDER — BUPROPION HCL ER (XL) 300 MG PO TB24
300.0000 mg | ORAL_TABLET | Freq: Every day | ORAL | 0 refills | Status: DC
Start: 2020-09-10 — End: 2021-01-08

## 2020-09-10 MED ORDER — HYDROXYZINE HCL 10 MG PO TABS: 20.0000 mg | ORAL_TABLET | Freq: Every evening | ORAL | 0 refills | Status: DC | PRN

## 2020-09-10 NOTE — Progress Notes (Signed)
Virtual Visit via Video Note  I connected with Patricia Good on 09/10/20 at 11:30 AM EDT by a video enabled telemedicine application and verified that I am speaking with the correct person using two identifiers.  Location Provider Location : Office Patient Location : Home  Participants: Patient , Provider    I discussed the limitations of evaluation and management by telemedicine and the availability of in person appointments. The patient expressed understanding and agreed to proceed.   I discussed the assessment and treatment plan with the patient. The patient was provided an opportunity to ask questions and all were answered. The patient agreed with the plan and demonstrated an understanding of the instructions.   The patient was advised to call back or seek an in-person evaluation if the symptoms worsen or if the condition fails to improve as anticipated.  Video connection was lost at less than 50% of the duration of the visit, at which time the remainder of the visit was completed through audio only     [facility] MD OP Progress Note  09/10/2020 1:27 PM Patricia Good  MRN:  [id number]  Chief Complaint:  Chief Complaint    Follow-up; Depression; Hallucinations; Insomnia     HPI: Patricia Good is a 60 year old female, divorced, has a history of schizoaffective disorder, social anxiety disorder, PTSD, COPD, vitamin B12 deficiency, psoriasis, history of malignant melanoma, hypothyroidism, osteoarthritis was evaluated by telemedicine today.  Patient today reports she currently lives with her mother in [place].  She reports she had to move out of her apartment due to having neighbors who had drug problems and she did not want to be around it.  She is currently on a waiting list to move into [facility] which is under [employer].  She reports she is coping okay with her current situational stressors of housing.  She is getting along with her mother okay.  She denies any significant mood swings.   Reports sleep is okay.  Denies any hallucinations.  Did not appear to be preoccupied with any delusions.  She denies any side effects to medications and reports she is compliant on them.  Patient denies any other concerns today.  Visit Diagnosis:    ICD-10-CM   1. Schizoaffective disorder, bipolar type ([place])  F25.0 ARIPiprazole (ABILIFY) 30 MG tablet    hydrOXYzine (ATARAX/VISTARIL) 10 MG tablet  2. PTSD (post-traumatic stress disorder)  F43.10 buPROPion (WELLBUTRIN XL) 300 MG 24 hr tablet    hydrOXYzine (ATARAX/VISTARIL) 10 MG tablet  3. Social anxiety disorder  F40.10   4. Insomnia due to mental condition  F51.05   5. Tobacco use disorder  F17.200     Past Psychiatric History: I have reviewed past psychiatric history from progress note on 07/16/2017.  Past trials of Prozac, trazodone.  Past Medical History:  Past Medical History:  Diagnosis Date  . Abnormal thyroid stimulating hormone (TSH) level   . ADHD (attention deficit hyperactivity disorder)   . Anxiety   . Asthma   . Diabetes mellitus, type II ([place])   . PTSD (post-traumatic stress disorder)   . Schizoaffective disorder [facility])     Past Surgical History:  Procedure Laterality Date  . ABDOMINAL HYSTERECTOMY    . ECTOPIC PREGNANCY SURGERY      Family Psychiatric History: I have reviewed family psychiatric history from progress note on 07/16/2017  Family History:  Family History  Problem Relation Age of Onset  . Hypertension Father   . Diabetes Father   . Heart disease  Father   . Blindness Father   . Obesity Brother   . Arthritis Brother   . Alcohol abuse Brother   . Drug abuse Brother   . Depression Brother   . Alcohol abuse Sister   . Drug abuse Sister   . Anxiety disorder Sister   . Depression Sister   . Breast cancer Neg Hx     Social History: Reviewed social history from progress note on 07/16/2017 Social History   Socioeconomic History  . Marital status: Single    Spouse name: Not on file  .  Number of children: 0  . Years of education: Not on file  . Highest education level: Associate degree: occupational, Hotel manager, or vocational program  Occupational History  . Not on file  Tobacco Use  . Smoking status: Current Some Day Smoker    Packs/day: 0.50    Types: Cigarettes    Start date: 01/22/1969  . Smokeless tobacco: Never Used  . Tobacco comment: half pack daily reported 04/29/2020  Vaping Use  . Vaping Use: Never used  Substance and Sexual Activity  . Alcohol use: Yes    Alcohol/week: 1.0 - 2.0 standard drink    Types: 1 Glasses of wine per week  . Drug use: No  . Sexual activity: Not Currently  Other Topics Concern  . Not on file  Social History Narrative  . Not on file   Social Determinants of Health   Financial Resource Strain: Not on file  Food Insecurity: Not on file  Transportation Needs: Not on file  Physical Activity: Not on file  Stress: Not on file  Social Connections: Not on file    Allergies:  Allergies  Allergen Reactions  . Shellfish Allergy Anaphylaxis    Only to crab meat (non-imitation), not shrimp  . Sulfa Antibiotics Hives, Anaphylaxis and Other (See Comments)  . Sulfasalazine Anaphylaxis and Hives    Other reaction(s): Other (See Comments)  . Lisinopril     Metabolic Disorder Labs: Lab Results  Component Value Date   HGBA1C 5.7 (H) 05/06/2020   MPG 116.89 05/06/2020   MPG 117 12/21/2017   Lab Results  Component Value Date   PROLACTIN 1.3 (L) 05/06/2020   PROLACTIN 1.2 (L) 07/12/2017   Lab Results  Component Value Date   CHOL 193 05/06/2020   TRIG 103 05/06/2020   HDL 56 05/06/2020   CHOLHDL 3.4 05/06/2020   VLDL 21 05/06/2020   LDLCALC 116 (H) 05/06/2020   LDLCALC 163 (H) 12/21/2017   Lab Results  Component Value Date   TSH 2.143 05/06/2020   TSH 0.199 (L) 12/21/2017    Therapeutic Level Labs: No results found for: LITHIUM No results found for: VALPROATE No components found for:  CBMZ  Current  Medications: Current Outpatient Medications  Medication Sig Dispense Refill  . albuterol (PROAIR HFA) 108 (90 Base) MCG/ACT inhaler Inhale 1-2 puffs into the lungs every 4 (four) hours as needed for wheezing or shortness of breath.     Marland Kitchen amLODipine (NORVASC) 10 MG tablet Take 10 mg by mouth daily.    . ARIPiprazole (ABILIFY) 30 MG tablet Take 1 tablet (30 mg total) by mouth daily. 90 tablet 0  . atorvastatin (LIPITOR) 20 MG tablet Take 20 mg by mouth at bedtime.    . B Complex-C (B-COMPLEX WITH VITAMIN C) tablet Take 1 tablet by mouth daily.    Marland Kitchen buPROPion (WELLBUTRIN XL) 300 MG 24 hr tablet Take 1 tablet (300 mg total) by mouth daily. 90 tablet  0  . calcium-vitamin D (OSCAL WITH D) 500-200 MG-UNIT tablet Take 1 tablet by mouth daily with breakfast.    . ferrous sulfate 325 (65 FE) MG tablet Take 325 mg by mouth.    . fluticasone (FLOVENT HFA) 220 MCG/ACT inhaler Inhale 2 puffs 2 (two) times daily into the lungs.    . furosemide (LASIX) 20 MG tablet Take 20 mg by mouth daily.     . hydrOXYzine (ATARAX/VISTARIL) 10 MG tablet Take 2 tablets (20 mg total) by mouth at bedtime as needed. for sleep and anxiety 180 tablet 0  . levothyroxine (SYNTHROID, LEVOTHROID) 112 MCG tablet     . meloxicam (MOBIC) 15 MG tablet Take 1 tablet by mouth daily.    . metroNIDAZOLE (FLAGYL) 500 MG tablet     . omeprazole (PRILOSEC) 40 MG capsule Take 40 mg by mouth daily.     Marland Kitchen OTEZLA 30 MG TABS Take 1 tablet by mouth 2 (two) times daily.    Marland Kitchen SPIRIVA HANDIHALER 18 MCG inhalation capsule     . triamcinolone cream (KENALOG) 0.1 %     . valACYclovir (VALTREX) 1000 MG tablet     . XIIDRA 5 % SOLN      No current facility-administered medications for this visit.     Musculoskeletal: Strength & Muscle Tone: UTA Gait & Station: UTA Patient leans: N/A  Psychiatric Specialty Exam: Review of Systems  Psychiatric/Behavioral: The patient is nervous/anxious.   All other systems reviewed and are negative.   There  were no vitals taken for this visit.There is no height or weight on file to calculate BMI.  General Appearance: UTA  Eye Contact:  UTA  Speech:  Normal Rate  Volume:  Normal  Mood:  Anxious Coping well  Affect:  UTA  Thought Process:  Goal Directed and Descriptions of Associations: Intact  Orientation:  Full (Time, Place, and Person)  Thought Content: Logical   Suicidal Thoughts:  No  Homicidal Thoughts:  No  Memory:  Immediate;   Fair Recent;   Fair Remote;   Fair  Judgement:  Fair  Insight:  Fair  Psychomotor Activity:  UTA  Concentration:  Concentration: Fair and Attention Span: Fair  Recall:  [employer] of Knowledge: Fair  Language: Fair  Akathisia:  No  Handed:  Right  AIMS (if indicated): UTA  Assets:  Communication Skills Desire for Improvement Housing Social Support  ADL's:  Intact  Cognition: WNL  Sleep:  Fair   Screenings: [place] Office Visit from 07/11/2020 in [facility] Total Score 0    AUDIT   Flowsheet Row Admission (Discharged) from 12/22/2017 in [facility]  Alcohol Use Disorder Identification Test Final Score (AUDIT) 1    PHQ2-9   Flowsheet Row Video Visit from 09/10/2020 in [facility] Office Visit from 07/11/2020 in [facility]  PHQ-2 Total Score 1 4  PHQ-9 Total Score -- 8    [place] Office Visit from 07/11/2020 in [facility] No Risk       Assessment and Plan: RASHON REZEK is a 60 year old Caucasian female, divorced, on SSD, currently lives in [place] with her mother, has a history of schizoaffective disorder, social anxiety disorder, PTSD, COPD, multiple other medical problems was evaluated by telemedicine today.  Patient is currently stable.  Plan as noted below.  Plan Schizoaffective disorder-stable Abilify 30 mg p.o. daily Wellbutrin XL 300 mg  p.o.  daily  PTSD-stable Abilify and Wellbutrin as prescribed Hydroxyzine 10 mg- take 2 tablets by mouth at bedtime as needed for sleep and anxiety  Tobacco use disorder-improving Provided counseling.  Follow-up in clinic in 2 months in office.   I have spent at least 15 minutes non face to face .    This note was generated in part or whole with voice recognition software. Voice recognition is usually quite accurate but there are transcription errors that can and very often do occur. I apologize for any typographical errors that were not detected and corrected.      Ursula Alert, MD 09/10/2020, 1:27 PM

## 2020-11-12 ENCOUNTER — Other Ambulatory Visit: Payer: Self-pay

## 2020-11-12 ENCOUNTER — Encounter: Payer: Self-pay | Admitting: Psychiatry

## 2020-11-12 ENCOUNTER — Ambulatory Visit (INDEPENDENT_AMBULATORY_CARE_PROVIDER_SITE_OTHER): Payer: Medicare Other | Admitting: Psychiatry

## 2020-11-12 VITALS — BP 110/63 | HR 88 | Temp 97.8°F | Wt 196.4 lb

## 2020-11-12 DIAGNOSIS — F4010 Social phobia, unspecified: Secondary | ICD-10-CM | POA: Diagnosis not present

## 2020-11-12 DIAGNOSIS — F4310 Post-traumatic stress disorder, unspecified: Secondary | ICD-10-CM

## 2020-11-12 DIAGNOSIS — F5105 Insomnia due to other mental disorder: Secondary | ICD-10-CM

## 2020-11-12 DIAGNOSIS — F17200 Nicotine dependence, unspecified, uncomplicated: Secondary | ICD-10-CM

## 2020-11-12 DIAGNOSIS — F250 Schizoaffective disorder, bipolar type: Secondary | ICD-10-CM | POA: Diagnosis not present

## 2020-11-12 NOTE — Progress Notes (Signed)
[facility] MD OP Progress Note  11/12/2020 3:40 PM Patricia Good  MRN:  [id number]  Chief Complaint:  Chief Complaint   Follow-up; Anxiety; Depression    HPI: Patricia Good is a 60 year old female, divorced, has a history of schizoaffective disorder, social anxiety disorder, PTSD, COPD, vitamin B12 deficiency, psoriasis, history of malignant melanoma, hypothyroidism, osteoarthritis was evaluated in office today.  Patient today reports she is currently having relationship struggles with her mother with whom she lives.  She reports she is trying to find place of her own.  She continues to be on the waiting list at [facility] which is under [employer].  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  This is in [place] which is like an hour away.  She reports she however has been doing okay with the commute so far.  She reports sleep is good. On days that she has to work she has been able to sleep at least 7 to 8 hours.  She denies any significant mood lability.  She does have anxiety about her current situational stressors however reports she is not interested in adding another medication for that.  She denies any suicidality.  Denies any perceptual disturbances.  She however appeared to be preoccupied with the thought that her mother was stealing from her, unknown if this is paranoia or not.  We will monitor.  She denies side effects to medications.  She has been cutting back on smoking cigarettes.  Visit Diagnosis:    ICD-10-CM   1. Schizoaffective disorder, bipolar type ([place])  F25.0     2. PTSD (post-traumatic stress disorder)  F43.10     3. Social anxiety disorder  F40.10     4. Insomnia due to mental condition  F51.05    mood    5. Tobacco use disorder  F17.200       Past Psychiatric History: Reviewed past psychiatric history from progress note on 07/16/2017.  Past trials of Prozac, trazodone  Past Medical History:  Past  Medical History:  Diagnosis Date   Abnormal thyroid stimulating hormone (TSH) level    ADHD (attention deficit hyperactivity disorder)    Anxiety    Asthma    Diabetes mellitus, type II (HCC)    PTSD (post-traumatic stress disorder)    Schizoaffective disorder (HCC)     Past Surgical History:  Procedure Laterality Date   ABDOMINAL HYSTERECTOMY     ECTOPIC PREGNANCY SURGERY      Family Psychiatric History: Reviewed family psychiatric history from progress note on 07/16/2017  Family History:  Family History  Problem Relation Age of Onset   Hypertension Father    Diabetes Father    Heart disease Father    Blindness Father    Obesity Brother    Arthritis Brother    Alcohol abuse Brother    Drug abuse Brother    Depression Brother    Alcohol abuse Sister    Drug abuse Sister    Anxiety disorder Sister    Depression Sister    Breast cancer Neg Hx     Social History: Reviewed social history from progress note on 07/16/2017 Social History   Socioeconomic History   Marital status: Single    Spouse name: Not on file   Number of children: 0   Years of education: Not on file   Highest education level: Associate degree: occupational, Hotel manager, or vocational program  Occupational History   Not on  file  Tobacco Use   Smoking status: Some Days    Packs/day: 0.50    Types: Cigarettes    Start date: 01/22/1969   Smokeless tobacco: Never   Tobacco comments:    half pack daily reported 04/29/2020  Vaping Use   Vaping Use: Never used  Substance and Sexual Activity   Alcohol use: Yes    Alcohol/week: 1.0 - 2.0 standard drink    Types: 1 Glasses of wine per week   Drug use: No   Sexual activity: Not Currently  Other Topics Concern   Not on file  Social History Narrative   Not on file   Social Determinants of Health   Financial Resource Strain: Not on file  Food Insecurity: Not on file  Transportation Needs: Not on file  Physical Activity: Not on file  Stress: Not on  file  Social Connections: Not on file    Allergies:  Allergies  Allergen Reactions   Shellfish Allergy Anaphylaxis    Only to crab meat (non-imitation), not shrimp   Sulfa Antibiotics Hives, Anaphylaxis and Other (See Comments)   Sulfasalazine Anaphylaxis and Hives    Other reaction(s): Other (See Comments)   Lisinopril     Metabolic Disorder Labs: Lab Results  Component Value Date   HGBA1C 5.7 (H) 05/06/2020   MPG 116.89 05/06/2020   MPG 117 12/21/2017   Lab Results  Component Value Date   PROLACTIN 1.3 (L) 05/06/2020   PROLACTIN 1.2 (L) 07/12/2017   Lab Results  Component Value Date   CHOL 193 05/06/2020   TRIG 103 05/06/2020   HDL 56 05/06/2020   CHOLHDL 3.4 05/06/2020   VLDL 21 05/06/2020   LDLCALC 116 (H) 05/06/2020   LDLCALC 163 (H) 12/21/2017   Lab Results  Component Value Date   TSH 2.143 05/06/2020   TSH 0.199 (L) 12/21/2017    Therapeutic Level Labs: No results found for: LITHIUM No results found for: VALPROATE No components found for:  CBMZ  Current Medications: Current Outpatient Medications  Medication Sig Dispense Refill   albuterol (PROAIR HFA) 108 (90 Base) MCG/ACT inhaler Inhale 1-2 puffs into the lungs every 4 (four) hours as needed for wheezing or shortness of breath.      amLODipine (NORVASC) 10 MG tablet Take 10 mg by mouth daily.     ARIPiprazole (ABILIFY) 30 MG tablet Take 1 tablet (30 mg total) by mouth daily. 90 tablet 0   atorvastatin (LIPITOR) 20 MG tablet Take 20 mg by mouth at bedtime.     B Complex-C (B-COMPLEX WITH VITAMIN C) tablet Take 1 tablet by mouth daily.     buPROPion (WELLBUTRIN XL) 300 MG 24 hr tablet Take 1 tablet (300 mg total) by mouth daily. 90 tablet 0   calcium-vitamin D (OSCAL WITH D) 500-200 MG-UNIT tablet Take 1 tablet by mouth daily with breakfast.     ferrous sulfate 325 (65 FE) MG tablet Take 325 mg by mouth.     fluticasone (FLOVENT HFA) 220 MCG/ACT inhaler Inhale 2 puffs 2 (two) times daily into the  lungs.     furosemide (LASIX) 20 MG tablet Take 20 mg by mouth daily.      hydrOXYzine (ATARAX/VISTARIL) 10 MG tablet Take 2 tablets (20 mg total) by mouth at bedtime as needed. for sleep and anxiety 180 tablet 0   levothyroxine (SYNTHROID, LEVOTHROID) 112 MCG tablet      meloxicam (MOBIC) 15 MG tablet Take 1 tablet by mouth daily.     metroNIDAZOLE (FLAGYL)  500 MG tablet      omeprazole (PRILOSEC) 40 MG capsule Take 40 mg by mouth daily.      OTEZLA 30 MG TABS Take 1 tablet by mouth 2 (two) times daily.     SPIRIVA HANDIHALER 18 MCG inhalation capsule      triamcinolone cream (KENALOG) 0.1 %      valACYclovir (VALTREX) 1000 MG tablet      XIIDRA 5 % SOLN      No current facility-administered medications for this visit.     Musculoskeletal: Strength & Muscle Tone: within normal limits Gait & Station: normal Patient leans: N/A  Psychiatric Specialty Exam: Review of Systems  Psychiatric/Behavioral:  The patient is nervous/anxious.   All other systems reviewed and are negative.  Blood pressure 110/63, pulse 88, temperature 97.8 F (36.6 C), temperature source Temporal, weight 196 lb 6.4 oz (89.1 kg).Body mass index is 37.11 kg/m.  General Appearance: Casual  Eye Contact:  Good  Speech:  Clear and Coherent  Volume:  Normal  Mood:  Anxious  Affect:  Congruent  Thought Process:  Goal Directed and Descriptions of Associations: Intact  Orientation:  Full (Time, Place, and Person)  Thought Content: Logical   Suicidal Thoughts:  No  Homicidal Thoughts:  No  Memory:  Immediate;   Fair Recent;   Fair Remote;   Fair  Judgement:  Fair  Insight:  Fair  Psychomotor Activity:  Normal  Concentration:  Concentration: Fair and Attention Span: Fair  Recall:  [employer] of Knowledge: Fair  Language: Fair  Akathisia:  No  Handed:  Right  AIMS (if indicated): done  Assets:  Communication Skills Desire for Improvement Housing  ADL's:  Intact  Cognition: WNL  Sleep:  Fair    Screenings: [place] Office Visit from 11/12/2020 in [facility] Office Visit from 07/11/2020 in [facility] Total Score 0 0      AUDIT    Flowsheet Row Admission (Discharged) from 12/22/2017 in [facility]  Alcohol Use Disorder Identification Test Final Score (AUDIT) 1      PHQ2-9    Flowsheet Row Video Visit from 09/10/2020 in [facility] Office Visit from 07/11/2020 in [facility]  PHQ-2 Total Score 1 4  PHQ-9 Total Score -- 8      [place] Office Visit from 07/11/2020 in [facility] No Risk        Assessment and Plan: Patricia Good is a 60 year old Caucasian female, divorced, on SSD, currently lives in [place] with her mother, has a history of schizoaffective disorder, social anxiety disorder, PTSD, COPD, multiple other medical problems was evaluated in office today.  Patient with situational stressors of relationship struggles with her mother, otherwise stable on medications.  Plan Schizoaffective disorder-stable Abilify 30 mg p.o. daily Wellbutrin XL 300 mg p.o. daily  PTSD-stable Abilify and Wellbutrin as prescribed Hydroxyzine 10 mg - 2 tablets by mouth at bedtime as needed for sleep and anxiety.  Tobacco use disorder-improving Provided counseling for 2 minutes.  She is currently cutting back.  Follow-up in clinic in 2 months in office.  This note was generated in part or whole with voice recognition software. Voice recognition is usually quite accurate but there are transcription errors that can and very often do occur. I apologize for any typographical errors that were not detected and corrected.  Ursula Alert, MD 11/13/2020, 3:40 PM

## 2021-01-08 ENCOUNTER — Telehealth: Payer: Self-pay

## 2021-01-08 DIAGNOSIS — F250 Schizoaffective disorder, bipolar type: Secondary | ICD-10-CM

## 2021-01-08 DIAGNOSIS — F4310 Post-traumatic stress disorder, unspecified: Secondary | ICD-10-CM

## 2021-01-08 MED ORDER — HYDROXYZINE HCL 10 MG PO TABS: 20.0000 mg | ORAL_TABLET | Freq: Every evening | ORAL | 0 refills | Status: DC | PRN

## 2021-01-08 MED ORDER — BUPROPION HCL ER (XL) 300 MG PO TB24: 300.0000 mg | ORAL_TABLET | Freq: Every day | ORAL | 0 refills | Status: DC

## 2021-01-08 MED ORDER — ARIPIPRAZOLE 30 MG PO TABS: 30.0000 mg | ORAL_TABLET | Freq: Every day | ORAL | 0 refills | Status: DC

## 2021-01-08 NOTE — Telephone Encounter (Signed)
I have sent hydroxyzine, Abilify, Wellbutrin to [facility], [place].  Verified pharmacy with patient.

## 2021-01-08 NOTE — Telephone Encounter (Signed)
pt called states she needs refills on her hydroxyzine, abilify, wellbutrin

## 2021-01-14 ENCOUNTER — Ambulatory Visit: Payer: Medicare Other | Admitting: Psychiatry

## 2021-04-10 ENCOUNTER — Other Ambulatory Visit: Payer: Self-pay

## 2021-04-10 ENCOUNTER — Ambulatory Visit (INDEPENDENT_AMBULATORY_CARE_PROVIDER_SITE_OTHER): Payer: 59 | Admitting: Psychiatry

## 2021-04-10 ENCOUNTER — Encounter: Payer: Self-pay | Admitting: Psychiatry

## 2021-04-10 VITALS — BP 126/79 | HR 80 | Temp 98.1°F | Wt 205.4 lb

## 2021-04-10 DIAGNOSIS — F5105 Insomnia due to other mental disorder: Secondary | ICD-10-CM

## 2021-04-10 DIAGNOSIS — F250 Schizoaffective disorder, bipolar type: Secondary | ICD-10-CM

## 2021-04-10 DIAGNOSIS — F4010 Social phobia, unspecified: Secondary | ICD-10-CM | POA: Diagnosis not present

## 2021-04-10 DIAGNOSIS — F4310 Post-traumatic stress disorder, unspecified: Secondary | ICD-10-CM

## 2021-04-10 DIAGNOSIS — F17200 Nicotine dependence, unspecified, uncomplicated: Secondary | ICD-10-CM

## 2021-04-10 MED ORDER — PRAZOSIN HCL 2 MG PO CAPS: 2.0000 mg | ORAL_CAPSULE | Freq: Every day | ORAL | 1 refills | Status: DC

## 2021-04-10 MED ORDER — ARIPIPRAZOLE 30 MG PO TABS: 30.0000 mg | ORAL_TABLET | Freq: Every day | ORAL | 0 refills | Status: DC

## 2021-04-10 MED ORDER — HYDROXYZINE HCL 10 MG PO TABS: 20.0000 mg | ORAL_TABLET | Freq: Every evening | ORAL | 0 refills | Status: DC | PRN

## 2021-04-10 MED ORDER — BUPROPION HCL ER (XL) 300 MG PO TB24: 300.0000 mg | ORAL_TABLET | Freq: Every day | ORAL | 0 refills | Status: DC

## 2021-04-10 NOTE — Progress Notes (Signed)
[facility] MD OP Progress Note  04/10/2021 6:10 PM Patricia Good  MRN:  [id number]  Chief Complaint:  Chief Complaint   Follow-up; Depression; Hallucinations    HPI: Patricia Good is a 61 year old female, divorced, has a history of schizoaffective disorder, social anxiety disorder, PTSD, COPD, vitamin B12 deficiency, psoriasis, history of malignant melanoma, hypothyroidism, osteoarthritis was evaluated in office today.  Patient's last appointment was on 11/12/2020.  Patient today reports she moved back to [facility] and would like to reestablish care with Probation officer.  Patient reports her medications were provided by [facility] recently.  Patient reports she continues to have intrusive memories of her trauma which does have an impact on her functioning on and off.  Patient is currently taking prazosin which helps to some extent.  She is not in psychotherapy at this time however is agreeable to restart therapy.  Patient reports she does have anxiety about her housing situation, has applied and is currently on a wait list at [facility] which is under [employer].  Patient however reports she has been coping okay on the current medication regimen.  Denies any side effects.  Denies any hallucinations or perceptual disturbances.  Has not had them in the past 5 to 6 months.  Patient denies any suicidality or homicidality.  Denies any other concerns today.  Visit Diagnosis:    ICD-10-CM   1. Schizoaffective disorder, bipolar type ([place])  F25.0 ARIPiprazole (ABILIFY) 30 MG tablet    hydrOXYzine (ATARAX) 10 MG tablet    2. PTSD (post-traumatic stress disorder)  F43.10 buPROPion (WELLBUTRIN XL) 300 MG 24 hr tablet    prazosin (MINIPRESS) 2 MG capsule    hydrOXYzine (ATARAX) 10 MG tablet    3. Social anxiety disorder  F40.10     4. Insomnia due to mental condition  F51.05    mood    5. Tobacco use disorder  F17.200       Past Psychiatric History: Reviewed past psychiatric history from progress note on  07/16/2017.  Past trials of Prozac, trazodone.  Past Medical History:  Past Medical History:  Diagnosis Date   Abnormal thyroid stimulating hormone (TSH) level    ADHD (attention deficit hyperactivity disorder)    Anxiety    Asthma    Diabetes mellitus, type II (HCC)    PTSD (post-traumatic stress disorder)    Schizoaffective disorder ([place])     Past Surgical History:  Procedure Laterality Date   ABDOMINAL HYSTERECTOMY     ECTOPIC PREGNANCY SURGERY      Family Psychiatric History: Reviewed family psychiatric history from progress note on 07/16/2017.  Family History:  Family History  Problem Relation Age of Onset   Hypertension Father    Diabetes Father    Heart disease Father    Blindness Father    Obesity Brother    Arthritis Brother    Alcohol abuse Brother    Drug abuse Brother    Depression Brother    Alcohol abuse Sister    Drug abuse Sister    Anxiety disorder Sister    Depression Sister    Breast cancer Neg Hx     Social History: Reviewed social history from progress note on 07/16/2017. Social History   Socioeconomic History   Marital status: Single    Spouse name: Not on file   Number of children: 0   Years of education: Not on file   Highest education level: Associate degree: occupational, Hotel manager, or vocational program  Occupational History   Not on file  Tobacco Use   Smoking status: Some Days    Packs/day: 0.50    Types: Cigarettes    Start date: 01/22/1969   Smokeless tobacco: Never   Tobacco comments:    3-5 cigarettes per day reported 04/10/2021  Vaping Use   Vaping Use: Never used  Substance and Sexual Activity   Alcohol use: Yes    Alcohol/week: 1.0 - 2.0 standard drink    Types: 1 Glasses of wine per week   Drug use: No   Sexual activity: Not Currently  Other Topics Concern   Not on file  Social History Narrative   Not on file   Social Determinants of Health   Financial Resource Strain: Not on file  Food Insecurity: Not on file   Transportation Needs: Not on file  Physical Activity: Not on file  Stress: Not on file  Social Connections: Not on file    Allergies:  Allergies  Allergen Reactions   Shellfish Allergy Anaphylaxis    Only to crab meat (non-imitation), not shrimp   Sulfa Antibiotics Hives, Anaphylaxis and Other (See Comments)   Sulfasalazine Anaphylaxis and Hives    Other reaction(s): Other (See Comments)   Lisinopril     Metabolic Disorder Labs: Lab Results  Component Value Date   HGBA1C 5.7 (H) 05/06/2020   MPG 116.89 05/06/2020   MPG 117 12/21/2017   Lab Results  Component Value Date   PROLACTIN 1.3 (L) 05/06/2020   PROLACTIN 1.2 (L) 07/12/2017   Lab Results  Component Value Date   CHOL 193 05/06/2020   TRIG 103 05/06/2020   HDL 56 05/06/2020   CHOLHDL 3.4 05/06/2020   VLDL 21 05/06/2020   LDLCALC 116 (H) 05/06/2020   LDLCALC 163 (H) 12/21/2017   Lab Results  Component Value Date   TSH 2.143 05/06/2020   TSH 0.199 (L) 12/21/2017    Therapeutic Level Labs: No results found for: LITHIUM No results found for: VALPROATE No components found for:  CBMZ  Current Medications: Current Outpatient Medications  Medication Sig Dispense Refill   alendronate (FOSAMAX) 70 MG tablet Take 70 mg by mouth once a week.     amLODipine (NORVASC) 10 MG tablet Take 10 mg by mouth daily.     atorvastatin (LIPITOR) 20 MG tablet Take 20 mg by mouth at bedtime.     B Complex-C (B-COMPLEX WITH VITAMIN C) tablet Take 1 tablet by mouth daily.     calcium-vitamin D (OSCAL WITH D) 500-200 MG-UNIT tablet Take 1 tablet by mouth daily with breakfast.     furosemide (LASIX) 20 MG tablet Take 20 mg by mouth daily.      levothyroxine (SYNTHROID, LEVOTHROID) 112 MCG tablet      meloxicam (MOBIC) 15 MG tablet Take 1 tablet by mouth daily.     omeprazole (PRILOSEC) 40 MG capsule Take 40 mg by mouth daily.      prazosin (MINIPRESS) 2 MG capsule Take 1 capsule (2 mg total) by mouth at bedtime. 90 capsule 1    SPIRIVA HANDIHALER 18 MCG inhalation capsule      valACYclovir (VALTREX) 1000 MG tablet      ARIPiprazole (ABILIFY) 30 MG tablet Take 1 tablet (30 mg total) by mouth daily. 90 tablet 0   buPROPion (WELLBUTRIN XL) 300 MG 24 hr tablet Take 1 tablet (300 mg total) by mouth daily. 90 tablet 0   hydrOXYzine (ATARAX) 10 MG tablet Take 2 tablets (20 mg total) by mouth at bedtime as needed. for sleep and anxiety 180  tablet 0   No current facility-administered medications for this visit.     Musculoskeletal: Strength & Muscle Tone: within normal limits Gait & Station: normal Patient leans: N/A  Psychiatric Specialty Exam: Review of Systems  Psychiatric/Behavioral:  The patient is nervous/anxious.   All other systems reviewed and are negative.  Blood pressure 126/79, pulse 80, temperature 98.1 F (36.7 C), temperature source Temporal, weight 205 lb 6.4 oz (93.2 kg).Body mass index is 38.81 kg/m.  General Appearance: Casual  Eye Contact:  Fair  Speech: Normal  Volume:  Normal  Mood:  Anxious  Affect:  Congruent  Thought Process:  Goal Directed and Descriptions of Associations: Intact  Orientation:  Full (Time, Place, and Person)  Thought Content: Logical   Suicidal Thoughts:  No  Homicidal Thoughts:  No  Memory:  Immediate;   Fair Recent;   Fair Remote;   limited  Judgement:  Fair  Insight:  Fair  Psychomotor Activity:  Normal  Concentration:  Concentration: Fair and Attention Span: Fair  Recall:  [employer] of Knowledge: Fair  Language: Fair  Akathisia:  No  Handed:  Right  AIMS (if indicated): done,0  Assets:  Communication Skills Desire for Improvement Housing Talents/Skills Transportation  ADL's:  Intact  Cognition: WNL  Sleep:  Fair   Screenings: [place] Office Visit from 11/12/2020 in [place] [facility] Office Visit from 07/11/2020 in [facility] Total Score 0 0      AUDIT    Flowsheet Row  Admission (Discharged) from 12/22/2017 in [facility]  Alcohol Use Disorder Identification Test Final Score (AUDIT) 1      PHQ2-9    Flowsheet Row Video Visit from 09/10/2020 in [facility] Office Visit from 07/11/2020 in [facility]  PHQ-2 Total Score 1 4  PHQ-9 Total Score -- 8      [place] Office Visit from 07/11/2020 in [facility] No Risk        Assessment and Plan: Patricia Good is a 61 year old Caucasian female, divorced, on SSD, currently lives in [place], has a history of schizoaffective disorder, social anxiety, PTSD, multiple medical problems was evaluated in office today.  Patient with trauma related symptoms like intrusive memories will benefit from the following plan.  Plan  Schizoaffective disorder-stable Abilify 30 mg p.o. daily Wellbutrin XL 300 mg p.o. daily  PTSD-unstable Referral for CBT.  Patient advised to restart trauma focused therapy.  I have communicated with front desk to schedule this patient with our therapist. Abilify and Wellbutrin as prescribed Hydroxyzine 10 mg - 2 tablets by mouth at bedtime as needed for sleep and anxiety Change prazosin to 2 mg p.o. nightly for nightmares.  Tobacco use disorder-improving Provided counseling for 2 minutes.  Follow-up in clinic in 4 weeks or sooner if needed.  This note was generated in part or whole with voice recognition software. Voice recognition is usually quite accurate but there are transcription errors that can and very often do occur. I apologize for any typographical errors that were not detected and corrected.     Ursula Alert, MD 04/11/2021, 1:42 PM

## 2021-04-14 ENCOUNTER — Other Ambulatory Visit: Payer: Self-pay | Admitting: Family Medicine

## 2021-04-14 DIAGNOSIS — Z1231 Encounter for screening mammogram for malignant neoplasm of breast: Secondary | ICD-10-CM

## 2021-06-05 ENCOUNTER — Ambulatory Visit (INDEPENDENT_AMBULATORY_CARE_PROVIDER_SITE_OTHER): Payer: Medicare Other | Admitting: Psychiatry

## 2021-06-05 ENCOUNTER — Encounter: Payer: Self-pay | Admitting: Psychiatry

## 2021-06-05 ENCOUNTER — Ambulatory Visit (INDEPENDENT_AMBULATORY_CARE_PROVIDER_SITE_OTHER): Payer: Medicare Other | Admitting: Licensed Clinical Social Worker

## 2021-06-05 ENCOUNTER — Other Ambulatory Visit: Payer: Self-pay

## 2021-06-05 VITALS — BP 118/65 | HR 85 | Temp 98.3°F | Wt 206.2 lb

## 2021-06-05 DIAGNOSIS — F250 Schizoaffective disorder, bipolar type: Secondary | ICD-10-CM

## 2021-06-05 DIAGNOSIS — F4010 Social phobia, unspecified: Secondary | ICD-10-CM | POA: Diagnosis not present

## 2021-06-05 DIAGNOSIS — F5105 Insomnia due to other mental disorder: Secondary | ICD-10-CM

## 2021-06-05 DIAGNOSIS — F17200 Nicotine dependence, unspecified, uncomplicated: Secondary | ICD-10-CM

## 2021-06-05 DIAGNOSIS — F4310 Post-traumatic stress disorder, unspecified: Secondary | ICD-10-CM

## 2021-06-05 MED ORDER — HYDROXYZINE HCL 10 MG PO TABS: 20.0000 mg | ORAL_TABLET | Freq: Every evening | ORAL | 0 refills | Status: DC | PRN

## 2021-06-05 MED ORDER — BUPROPION HCL ER (XL) 300 MG PO TB24: 300.0000 mg | ORAL_TABLET | Freq: Every day | ORAL | 0 refills | Status: DC

## 2021-06-05 MED ORDER — ARIPIPRAZOLE 30 MG PO TABS: 30.0000 mg | ORAL_TABLET | Freq: Every day | ORAL | 0 refills | Status: DC

## 2021-06-05 NOTE — Patient Instructions (Signed)
Managing the Challenge of Quitting Smoking ?Quitting smoking is a physical and mental challenge. You will face cravings, withdrawal symptoms, and temptation. Before quitting, work with your health care provider to make a plan that can help you manage quitting. Preparation can help you quit and keep you from giving in. ?How to manage lifestyle changes ?Managing stress ?Stress can make you want to smoke, and wanting to smoke may cause stress. It is important to find ways to manage your stress. You might try some of the following: ?Practice relaxation techniques. ?Breathe slowly and deeply, in through your nose and out through your mouth. ?Listen to music. ?Soak in a bath or take a shower. ?Imagine a peaceful place or vacation. ?Get some support. ?Talk with family or friends about your stress. ?Join a support group. ?Talk with a counselor or therapist. ?Get some physical activity. ?Go for a walk, run, or bike ride. ?Play a favorite sport. ?Practice yoga. ? ?Medicines ?Talk with your health care provider about medicines that might help you deal with cravings and make quitting easier for you. ?Relationships ?Social situations can be difficult when you are quitting smoking. To manage this, you can: ?Avoid parties and other social situations where people might be smoking. ?Avoid alcohol. ?Leave right away if you have the urge to smoke. ?Explain to your family and friends that you are quitting smoking. Ask for support and let them know you might be a bit grumpy. ?Plan activities where smoking is not an option. ?General instructions ?Be aware that many people gain weight after they quit smoking. However, not everyone does. To keep from gaining weight, have a plan in place before you quit and stick to the plan after you quit. Your plan should include: ?Having healthy snacks. When you have a craving, it may help to: ?Eat popcorn, carrots, celery, or other cut vegetables. ?Chew sugar-free gum. ?Changing how you eat. ?Eat small  portion sizes at meals. ?Eat 4-6 small meals throughout the day instead of 1-2 large meals a day. ?Be mindful when you eat. Do not watch television or do other things that might distract you as you eat. ?Exercising regularly. ?Make time to exercise each day. If you do not have time for a long workout, do short bouts of exercise for 5-10 minutes several times a day. ?Do some form of strengthening exercise, such as weight lifting. ?Do some exercise that gets your heart beating and causes you to breathe deeply, such as walking fast, running, swimming, or biking. This is very important. ?Drinking plenty of water or other low-calorie or no-calorie drinks. Drink 6-8 glasses of water daily. ? ?How to recognize withdrawal symptoms ?Your body and mind may experience discomfort as you try to get used to not having nicotine in your system. These effects are called withdrawal symptoms. They may include: ?Feeling hungrier than normal. ?Having trouble concentrating. ?Feeling irritable or restless. ?Having trouble sleeping. ?Feeling depressed. ?Craving a cigarette. ?To manage withdrawal symptoms: ?Avoid places, people, and activities that trigger your cravings. ?Remember why you want to quit. ?Get plenty of sleep. ?Avoid coffee and other caffeinated drinks. These may worsen some of your symptoms. ?These symptoms may surprise you. But be assured that they are normal to have when quitting smoking. ?How to manage cravings ?Come up with a plan for how to deal with your cravings. The plan should include the following: ?A definition of the specific situation you want to deal with. ?An alternative action you will take. ?A clear idea for how this action   will help. ?The name of someone who might help you with this. ?Cravings usually last for 5-10 minutes. Consider taking the following actions to help you with your plan to deal with cravings: ?Keep your mouth busy. ?Chew sugar-free gum. ?Suck on hard candies or a straw. ?Brush your  teeth. ?Keep your hands and body busy. ?Change to a different activity right away. ?Squeeze or play with a ball. ?Do an activity or a hobby, such as making bead jewelry, practicing needlepoint, or working with wood. ?Mix up your normal routine. ?Take a short exercise break. Go for a quick walk or run up and down stairs. ?Focus on doing something kind or helpful for someone else. ?Call a friend or family member to talk during a craving. ?Join a support group. ?Contact a quitline. ?Where to find support ?To get help or find a support group: ?Call the National Cancer Institute's Smoking Quitline: 1-800-QUIT NOW (784-8669) ?Visit the website of the Substance Abuse and Mental Health Services Administration: www.samhsa.gov ?Text QUIT to SmokefreeTXT: 478848 ?Where to find more information ?Visit these websites to find more information on quitting smoking: ?National Cancer Institute: www.smokefree.gov ?American Lung Association: www.lung.org ?American Cancer Society: www.cancer.org ?Centers for Disease Control and Prevention: www.cdc.gov ?American Heart Association: www.heart.org ?Contact a health care provider if: ?You want to change your plan for quitting. ?The medicines you are taking are not helping. ?Your eating feels out of control or you cannot sleep. ?Get help right away if: ?You feel depressed or become very anxious. ?Summary ?Quitting smoking is a physical and mental challenge. You will face cravings, withdrawal symptoms, and temptation to smoke again. Preparation can help you as you go through these challenges. ?Try different techniques to manage stress, handle social situations, and prevent weight gain. ?You can deal with cravings by keeping your mouth busy (such as by chewing gum), keeping your hands and body busy, calling family or friends, or contacting a quitline for people who want to quit smoking. ?You can deal with withdrawal symptoms by avoiding places where people smoke, getting plenty of rest, and  avoiding drinks with caffeine. ?This information is not intended to replace advice given to you by your health care provider. Make sure you discuss any questions you have with your health care provider. ?Document Revised: 11/29/2020 Document Reviewed: 01/10/2019 ?Elsevier Patient Education ? 2022 Elsevier Inc. ? ?

## 2021-06-05 NOTE — Progress Notes (Signed)
[facility] OP Progress Note  06/05/2021 9:28 AM Patricia Good  MRN:  [id number]  Chief Complaint:  Chief Complaint  Patient presents with   Follow-up: 61 year old female, divorced, has a history of schizoaffective disorder, social anxiety disorder, PTSD, insomnia, tobacco use disorder, presented for medication management.   HPI: Patricia Good is a 61 year old female, has a history of schizoaffective disorder, social anxiety disorder, PTSD, COPD, vitamin B12 deficiency, psoriasis, history of malignant melanoma, hypothyroidism, osteoarthritis was evaluated in office today.  Patient today reports she was able to get into the [facility] finally.  Patient reports she really loves her current living situation.  She also has good neighbors and enjoys them.  Patient reports overall mood symptoms are stable.  Denies any suicidality, homicidality or perceptual disturbances.  Patient reports sleep and appetite as good.  Currently compliant on her medications as prescribed.  Denies side effects.  Continues to smoke cigarettes, was receptive to brief intervention, currently trying to cut back.  Denies any other concerns today.  Visit Diagnosis:    ICD-10-CM   1. Schizoaffective disorder, bipolar type ([place])  F25.0 TSH    hydrOXYzine (ATARAX) 10 MG tablet    ARIPiprazole (ABILIFY) 30 MG tablet    2. PTSD (post-traumatic stress disorder)  F43.10 TSH    hydrOXYzine (ATARAX) 10 MG tablet    buPROPion (WELLBUTRIN XL) 300 MG 24 hr tablet    3. Social anxiety disorder  F40.10 TSH    4. Insomnia due to mental condition  F51.05 TSH   mood    5. Tobacco use disorder  F17.200       Past Psychiatric History: Reviewed past psychiatric history from progress note on 07/16/2017.  Past trials of Prozac, trazodone.  Past Medical History:  Past Medical History:  Diagnosis Date   Abnormal thyroid stimulating hormone (TSH) level    ADHD (attention deficit hyperactivity disorder)    Anxiety    Asthma     Diabetes mellitus, type II (HCC)    PTSD (post-traumatic stress disorder)    Schizoaffective disorder ([place])     Past Surgical History:  Procedure Laterality Date   ABDOMINAL HYSTERECTOMY     ECTOPIC PREGNANCY SURGERY      Family Psychiatric History: Reviewed family psychiatric history from progress note on 07/16/2017.  Family History:  Family History  Problem Relation Age of Onset   Hypertension Father    Diabetes Father    Heart disease Father    Blindness Father    Obesity Brother    Arthritis Brother    Alcohol abuse Brother    Drug abuse Brother    Depression Brother    Alcohol abuse Sister    Drug abuse Sister    Anxiety disorder Sister    Depression Sister    Breast cancer Neg Hx     Social History: Reviewed social history from progress note on 07/16/2017. Social History   Socioeconomic History   Marital status: Single    Spouse name: Not on file   Number of children: 0   Years of education: Not on file   Highest education level: Associate degree: occupational, Hotel manager, or vocational program  Occupational History   Not on file  Tobacco Use   Smoking status: Some Days    Packs/day: 0.50    Types: Cigarettes    Start date: 01/22/1969   Smokeless tobacco: Never   Tobacco comments:    3-5 cigarettes per day reported 04/10/2021  Vaping Use   Vaping Use:  Never used  Substance and Sexual Activity   Alcohol use: Yes    Alcohol/week: 1.0 - 2.0 standard drink    Types: 1 Glasses of wine per week   Drug use: No   Sexual activity: Not Currently  Other Topics Concern   Not on file  Social History Narrative   Not on file   Social Determinants of Health   Financial Resource Strain: Not on file  Food Insecurity: Not on file  Transportation Needs: Not on file  Physical Activity: Not on file  Stress: Not on file  Social Connections: Not on file    Allergies:  Allergies  Allergen Reactions   Shellfish Allergy Anaphylaxis    Only to crab meat  (non-imitation), not shrimp   Sulfa Antibiotics Hives, Anaphylaxis and Other (See Comments)   Sulfasalazine Anaphylaxis and Hives    Other reaction(s): Other (See Comments)   Lisinopril     Metabolic Disorder Labs: Lab Results  Component Value Date   HGBA1C 5.7 (H) 05/06/2020   MPG 116.89 05/06/2020   MPG 117 12/21/2017   Lab Results  Component Value Date   PROLACTIN 1.3 (L) 05/06/2020   PROLACTIN 1.2 (L) 07/12/2017   Lab Results  Component Value Date   CHOL 193 05/06/2020   TRIG 103 05/06/2020   HDL 56 05/06/2020   CHOLHDL 3.4 05/06/2020   VLDL 21 05/06/2020   LDLCALC 116 (H) 05/06/2020   LDLCALC 163 (H) 12/21/2017   Lab Results  Component Value Date   TSH 2.143 05/06/2020   TSH 0.199 (L) 12/21/2017    Therapeutic Level Labs: No results found for: LITHIUM No results found for: VALPROATE No components found for:  CBMZ  Current Medications: Current Outpatient Medications  Medication Sig Dispense Refill   alendronate (FOSAMAX) 70 MG tablet Take 70 mg by mouth once a week.     amLODipine (NORVASC) 10 MG tablet Take 10 mg by mouth daily.     atorvastatin (LIPITOR) 20 MG tablet Take 20 mg by mouth at bedtime.     B Complex-C (B-COMPLEX WITH VITAMIN C) tablet Take 1 tablet by mouth daily.     calcium-vitamin D (OSCAL WITH D) 500-200 MG-UNIT tablet Take 1 tablet by mouth daily with breakfast.     furosemide (LASIX) 20 MG tablet Take 20 mg by mouth daily.      levothyroxine (SYNTHROID, LEVOTHROID) 112 MCG tablet      meloxicam (MOBIC) 15 MG tablet Take 1 tablet by mouth daily.     omeprazole (PRILOSEC) 40 MG capsule Take 40 mg by mouth daily.      prazosin (MINIPRESS) 2 MG capsule Take 1 capsule (2 mg total) by mouth at bedtime. 90 capsule 1   SPIRIVA HANDIHALER 18 MCG inhalation capsule      valACYclovir (VALTREX) 1000 MG tablet      ARIPiprazole (ABILIFY) 30 MG tablet Take 1 tablet (30 mg total) by mouth daily. 90 tablet 0   buPROPion (WELLBUTRIN XL) 300 MG 24 hr  tablet Take 1 tablet (300 mg total) by mouth daily. 90 tablet 0   hydrOXYzine (ATARAX) 10 MG tablet Take 2 tablets (20 mg total) by mouth at bedtime as needed. for sleep and anxiety 180 tablet 0   PREVIDENT 5000 DRY MOUTH 1.1 % GEL dental gel Place onto teeth.     No current facility-administered medications for this visit.     Musculoskeletal: Strength & Muscle Tone: within normal limits Gait & Station: normal Patient leans: N/A  Psychiatric Specialty  Exam: Review of Systems  Psychiatric/Behavioral: Negative.    All other systems reviewed and are negative.  Blood pressure 118/65, pulse 85, temperature 98.3 F (36.8 C), temperature source Temporal, weight 206 lb 3.2 oz (93.5 kg).Body mass index is 38.96 kg/m.  General Appearance: Casual  Eye Contact:  Fair  Speech:  Clear and Coherent  Volume:  Normal  Mood:  Euthymic  Affect:  Congruent  Thought Process:  Goal Directed and Descriptions of Associations: Intact  Orientation:  Full (Time, Place, and Person)  Thought Content: Logical   Suicidal Thoughts:  No  Homicidal Thoughts:  No  Memory:  Immediate;   Fair Recent;   Fair Remote;   Fair  Judgement:  Fair  Insight:  Fair  Psychomotor Activity:  Normal  Concentration:  Concentration: Fair and Attention Span: Fair  Recall:  [employer] of Knowledge: Fair  Language: Fair  Akathisia:  No  Handed:  Right  AIMS (if indicated): done  Assets:  Communication Skills Desire for Improvement Housing Social Support Transportation  ADL's:  Intact  Cognition: WNL  Sleep:  Fair   Screenings: [place] Office Visit from 11/12/2020 in [place] Office Visit from 07/11/2020 in [place] Total Score 0 0      AUDIT    [place] Admission (Discharged) from 12/22/2017 in [place]  Alcohol Use Disorder Identification Test Final Score (AUDIT) 1      PHQ2-9    [place] Visit from 06/05/2021 in [place] Video Visit from 09/10/2020 in [place] Office Visit from 07/11/2020 in [place]  PHQ-2 Total Score 0 1 4  PHQ-9 Total Score -- -- 8      [place] Office Visit from 06/05/2021 in [place] Office Visit from 07/11/2020 in [place] No Risk No Risk        Assessment and Plan: Patricia Good is a 61 year old Caucasian female, divorced, on SSD, currently lives in [place], has a history of schizoaffective disorder, social anxiety, PTSD, multiple medical problems was evaluated in office today.  Patient is currently stable.  Plan as noted below.  Plan Schizoaffective disorder-stable Abilify 30 mg p.o. daily Wellbutrin XL 300 mg p.o. daily  PTSD-improving Patient was referred for CBT-has upcoming appointment. Continue Abilify and Wellbutrin as prescribed Prazosin 2 mg p.o. nightly. Hydroxyzine 10 to 20 mg by mouth at bedtime as needed for sleep and anxiety.  Tobacco use disorder-improving Provided brief intervention, ask advise refer-for 2 minutes.  Will order labs-TSH.  Provided lab slip.   Follow-up in clinic in 2 months or sooner if needed.  Collaboration of Care: Collaboration of Care: Referral or follow-up with counselor/therapist AEB encouraged to follow up with Margreta Journey , therapist.  Patient/Guardian was advised Release of Information must be obtained prior to any record release in order to collaborate their care with an outside provider. Patient/Guardian was advised if they have not already done so to contact the registration department to sign all necessary forms in order for [place] to release information regarding their care.   Consent: Patient/Guardian gives verbal consent for treatment and assignment of benefits for services provided during this visit.  Patient/Guardian expressed understanding and agreed to proceed.   This note was generated in part or whole with voice recognition software. Voice recognition is usually quite accurate but there are transcription errors  that can and very often do occur. I apologize for any typographical errors that were not detected and corrected.      Ursula Alert, MD 06/05/2021, 9:28 AM

## 2021-06-06 ENCOUNTER — Ambulatory Visit: Payer: 59 | Admitting: Licensed Clinical Social Worker

## 2021-06-06 ENCOUNTER — Ambulatory Visit: Payer: 59 | Admitting: Psychiatry

## 2021-06-06 NOTE — Plan of Care (Signed)
Treatment plan drafted with pt input ?
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
## 2021-06-13 ENCOUNTER — Telehealth: Payer: Self-pay | Admitting: Psychiatry

## 2021-06-13 NOTE — Telephone Encounter (Signed)
Reviewed labs from [facility] drew [address] dated 04/29/2021- ?WBC-high at 13.9, RBC low at 3.74, MCV-high at 106, MCH-high at 37.2, neutrophil-high at 9.5, eosinophils-high at 0.7.  Otherwise within normal limits. ? ?CMP-glucose elevated at 100, creatinine elevated at 1.47, GFR-low at 40, BUN/creatinine ratio-11-low ? ?ALT-elevated at 39, AST within normal limits at 38, alkaline phosphatase high at 202.  Otherwise within normal limits. ? ?Lipid panel-LDL-elevated at 109-otherwise within normal limits. ? ?Patient to follow up with primary care provider for her abnormal labs. ?

## 2021-06-18 LAB — TSH: TSH: 2.25 u[IU]/mL (ref 0.450–4.500)

## 2021-06-27 ENCOUNTER — Telehealth: Payer: Self-pay | Admitting: Psychiatry

## 2021-06-27 NOTE — Telephone Encounter (Signed)
Returned call to patient-discussed TSH results-dated 06/17/2021-within normal limits. ?

## 2021-06-27 NOTE — Telephone Encounter (Signed)
Patient called stating provider had ordered TSH via [facility] - does not have a MyChart account and would like someone to call her with the results. ?

## 2021-08-20 ENCOUNTER — Encounter: Payer: Self-pay | Admitting: Psychiatry

## 2021-08-20 ENCOUNTER — Ambulatory Visit (INDEPENDENT_AMBULATORY_CARE_PROVIDER_SITE_OTHER): Payer: Medicare Other | Admitting: Licensed Clinical Social Worker

## 2021-08-20 ENCOUNTER — Ambulatory Visit (INDEPENDENT_AMBULATORY_CARE_PROVIDER_SITE_OTHER): Payer: Medicare Other | Admitting: Psychiatry

## 2021-08-20 VITALS — BP 116/75 | HR 94 | Temp 97.8°F | Wt 209.6 lb

## 2021-08-20 DIAGNOSIS — F4010 Social phobia, unspecified: Secondary | ICD-10-CM

## 2021-08-20 DIAGNOSIS — F250 Schizoaffective disorder, bipolar type: Secondary | ICD-10-CM

## 2021-08-20 DIAGNOSIS — F5105 Insomnia due to other mental disorder: Secondary | ICD-10-CM | POA: Diagnosis not present

## 2021-08-20 DIAGNOSIS — F4310 Post-traumatic stress disorder, unspecified: Secondary | ICD-10-CM

## 2021-08-20 DIAGNOSIS — F17200 Nicotine dependence, unspecified, uncomplicated: Secondary | ICD-10-CM

## 2021-08-20 MED ORDER — HYDROXYZINE HCL 10 MG PO TABS: 20.0000 mg | ORAL_TABLET | Freq: Every evening | ORAL | 0 refills | Status: DC | PRN

## 2021-08-20 MED ORDER — PRAZOSIN HCL 2 MG PO CAPS: 2.0000 mg | ORAL_CAPSULE | Freq: Every day | ORAL | 1 refills | Status: DC

## 2021-08-20 MED ORDER — ARIPIPRAZOLE 30 MG PO TABS: 30.0000 mg | ORAL_TABLET | Freq: Every day | ORAL | 0 refills | Status: DC

## 2021-08-20 MED ORDER — BUPROPION HCL ER (XL) 300 MG PO TB24: 300.0000 mg | ORAL_TABLET | Freq: Every day | ORAL | 0 refills | Status: DC

## 2021-08-20 NOTE — Plan of Care (Signed)
?  Problem: Thought Disorders CCP Problem  1 Develop healthy thinking patterns and beliefs about self, others, and the world that lead to the alleviation and help prevent the relapse of depression or anxiety 3 out of 5 sessions documented. ?Goal: LTG: Increase coping skills to promote long-term recovery and improve ability to perform daily activities ?Outcome: Progressing ?Goal: STG: Promote adherence to treatment regimen ?Outcome: Progressing ?Intervention: Encourage verbalization of feelings/concerns/expectations ?Note: explored ?Intervention: Encourage self-care activities ?Note: encouraged ?Intervention: Encourage patient to identify triggers ?Note: Pt able to identify ?Intervention: REVIEW PLEASE SKILLS (TREAT PHYSICAL ILLNESS, BALANCE EATING, AVOID MOOD-ALTERING SUBSTANCES, BALANCE SLEEP AND GET EXERCISE) WITH [name] ?Note: reviewed ?  ?

## 2021-08-20 NOTE — Progress Notes (Signed)
[facility] MD OP Progress Note ? ?08/20/2021 9:21 AM ?[name]  ?MRN:  [id number] ? ?Chief Complaint:  ?Chief Complaint  ?Patient presents with  ? Follow-up: 61 year old female, divorced, has a history of schizoaffective disorder, social anxiety disorder, PTSD, insomnia, tobacco use disorder, presented for medication management.  ? ?HPI: Patricia Good is a 61 year old Caucasian female who has a history of schizoaffective disorder, social anxiety disorder, PTSD, COPD, vitamin B12 deficiency, psoriasis, history of malignant melanoma, hypothyroidism, osteoarthritis was evaluated in office today. ? ?Patient today reports she has relationship struggles with her mother.  Mother is currently struggling with health problems and would like her to move back in with her.  That does worry her since patient reports she has never had a relationship with her mother ever and does not want to do that.  She currently lives at [facility] and reports she has good neighbors and friends and enjoys living there. ? ?Patient reports overall mood symptoms otherwise are stable. ? ?Reports sleep as good. ? ?Denies suicidality, homicidality or perceptual disturbances. ? ?Currently compliant on medications.  Denies side effects. ? ?Continues to work on smoking cessation, currently smokes 3 to 5 cigarettes/day.  He is aware of community resources. ? ?Denies any other concerns today. ? ?Visit Diagnosis:  ?  ICD-10-CM   ?1. Schizoaffective disorder, bipolar type ([place])  F25.0 hydrOXYzine (ATARAX) 10 MG tablet  ?  ARIPiprazole (ABILIFY) 30 MG tablet  ?  ?2. PTSD (post-traumatic stress disorder)  F43.10 prazosin (MINIPRESS) 2 MG capsule  ?  hydrOXYzine (ATARAX) 10 MG tablet  ?  buPROPion (WELLBUTRIN XL) 300 MG 24 hr tablet  ?  ?3. Social anxiety disorder  F40.10   ?  ?4. Insomnia due to mental condition  F51.05   ? mood  ?  ?5. Tobacco use disorder  F17.200   ?  ? ? ?Past Psychiatric History: Reviewed past psychiatric history from progress note on  07/16/2017.  Past trials of Prozac, trazodone. ? ?Past Medical History:  ?Past Medical History:  ?Diagnosis Date  ? Abnormal thyroid stimulating hormone (TSH) level   ? ADHD (attention deficit hyperactivity disorder)   ? Anxiety   ? Asthma   ? Diabetes mellitus, type II ([place])   ? PTSD (post-traumatic stress disorder)   ? Schizoaffective disorder ([place])   ?  ?Past Surgical History:  ?Procedure Laterality Date  ? ABDOMINAL HYSTERECTOMY    ? ECTOPIC PREGNANCY SURGERY    ? ? ?Family Psychiatric History: Reviewed family psychiatric history from progress note on 07/16/2017. ? ?Family History:  ?Family History  ?Problem Relation Age of Onset  ? Hypertension Father   ? Diabetes Father   ? Heart disease Father   ? Blindness Father   ? Obesity Brother   ? Arthritis Brother   ? Alcohol abuse Brother   ? Drug abuse Brother   ? Depression Brother   ? Alcohol abuse Sister   ? Drug abuse Sister   ? Anxiety disorder Sister   ? Depression Sister   ? Breast cancer Neg Hx   ? ? ?Social History: Reviewed social history from progress note on 07/16/2017. ?Social History  ? ?Socioeconomic History  ? Marital status: Single  ?  Spouse name: Not on file  ? Number of children: 0  ? Years of education: Not on file  ? Highest education level: Associate degree: occupational, Hotel manager, or vocational program  ?Occupational History  ? Not on file  ?Tobacco Use  ? Smoking status: Some Days  ?  Packs/day: 0.50  ?  Types: Cigarettes  ?  Start date: 01/22/1969  ? Smokeless tobacco: Never  ? Tobacco comments:  ?  3-5 cigarettes per day reported 04/10/2021  ?Vaping Use  ? Vaping Use: Never used  ?Substance and Sexual Activity  ? Alcohol use: Yes  ?  Alcohol/week: 1.0 - 2.0 standard drink  ?  Types: 1 Glasses of wine per week  ? Drug use: No  ? Sexual activity: Not Currently  ?Other Topics Concern  ? Not on file  ?Social History Narrative  ? Not on file  ? ?Social Determinants of Health  ? ?Financial Resource Strain: Not on file  ?Food Insecurity: Not on file   ?Transportation Needs: Not on file  ?Physical Activity: Not on file  ?Stress: Not on file  ?Social Connections: Not on file  ? ? ?Allergies:  ?Allergies  ?Allergen Reactions  ? Shellfish Allergy Anaphylaxis  ?  Only to crab meat (non-imitation), not shrimp  ? Sulfa Antibiotics Hives, Anaphylaxis and Other (See Comments)  ? Sulfasalazine Anaphylaxis and Hives  ?  Other reaction(s): Other (See Comments)  ? Lisinopril   ? ? ?Metabolic Disorder Labs: ?Lab Results  ?Component Value Date  ? HGBA1C 5.7 (H) 05/06/2020  ? MPG 116.89 05/06/2020  ? MPG 117 12/21/2017  ? ?Lab Results  ?Component Value Date  ? PROLACTIN 1.3 (L) 05/06/2020  ? PROLACTIN 1.2 (L) 07/12/2017  ? ?Lab Results  ?Component Value Date  ? CHOL 193 05/06/2020  ? TRIG 103 05/06/2020  ? HDL 56 05/06/2020  ? CHOLHDL 3.4 05/06/2020  ? VLDL 21 05/06/2020  ? LDLCALC 116 (H) 05/06/2020  ? LDLCALC 163 (H) 12/21/2017  ? ?Lab Results  ?Component Value Date  ? TSH 2.250 06/17/2021  ? TSH 2.143 05/06/2020  ? ? ?Therapeutic Level Labs: ?No results found for: LITHIUM ?No results found for: VALPROATE ?No components found for:  CBMZ ? ?Current Medications: ?Current Outpatient Medications  ?Medication Sig Dispense Refill  ? alendronate (FOSAMAX) 70 MG tablet Take 70 mg by mouth once a week.    ? amLODipine (NORVASC) 10 MG tablet Take 10 mg by mouth daily.    ? atorvastatin (LIPITOR) 20 MG tablet Take 20 mg by mouth at bedtime.    ? B Complex-C (B-COMPLEX WITH VITAMIN C) tablet Take 1 tablet by mouth daily.    ? calcium-vitamin D (OSCAL WITH D) 500-200 MG-UNIT tablet Take 1 tablet by mouth daily with breakfast.    ? furosemide (LASIX) 20 MG tablet Take 20 mg by mouth daily.     ? levothyroxine (SYNTHROID, LEVOTHROID) 112 MCG tablet     ? meloxicam (MOBIC) 15 MG tablet Take 1 tablet by mouth daily.    ? omeprazole (PRILOSEC) 40 MG capsule Take 40 mg by mouth daily.     ? PREVIDENT 5000 DRY MOUTH 1.1 % GEL dental gel Place onto teeth.    ? SPIRIVA HANDIHALER 18 MCG inhalation  capsule     ? valACYclovir (VALTREX) 1000 MG tablet     ? ARIPiprazole (ABILIFY) 30 MG tablet Take 1 tablet (30 mg total) by mouth daily. 90 tablet 0  ? buPROPion (WELLBUTRIN XL) 300 MG 24 hr tablet Take 1 tablet (300 mg total) by mouth daily. 90 tablet 0  ? hydrOXYzine (ATARAX) 10 MG tablet Take 2 tablets (20 mg total) by mouth at bedtime as needed. for sleep and anxiety 180 tablet 0  ? prazosin (MINIPRESS) 2 MG capsule Take 1 capsule (2 mg total) by  mouth at bedtime. 90 capsule 1  ? ?No current facility-administered medications for this visit.  ? ? ? ?Musculoskeletal: ?Strength & Muscle Tone: within normal limits ?Gait & Station: normal ?Patient leans: N/A ? ?Psychiatric Specialty Exam: ?Review of Systems  ?Psychiatric/Behavioral:  The patient is nervous/anxious.   ?All other systems reviewed and are negative.  ?Blood pressure 116/75, pulse 94, temperature 97.8 ?F (36.6 ?C), temperature source Oral, weight 209 lb 9.6 oz (95.1 kg).Body mass index is 39.6 kg/m?.  ?General Appearance: Casual  ?Eye Contact:  Fair  ?Speech:  Clear and Coherent  ?Volume:  Normal  ?Mood:  Anxious  ?Affect:  Congruent  ?Thought Process:  Goal Directed and Descriptions of Associations: Intact  ?Orientation:  Full (Time, Place, and Person)  ?Thought Content: Logical   ?Suicidal Thoughts:  No  ?Homicidal Thoughts:  No  ?Memory:  Immediate;   Fair ?Recent;   Fair ?Remote;   Fair  ?Judgement:  Fair  ?Insight:  Fair  ?Psychomotor Activity:  Normal  ?Concentration:  Concentration: Fair and Attention Span: Fair  ?Recall:  Fair  ?Fund of Knowledge: Fair  ?Language: Fair  ?Akathisia:  No  ?Handed:  Right  ?AIMS (if indicated): done  ?Assets:  Communication Skills ?Desire for Improvement ?Housing ?Social Support ?Transportation  ?ADL's:  Intact  ?Cognition: WNL  ?Sleep:  Fair  ? ?Screenings: ?AIMS   ? ?[facility] Office Visit from 08/20/2021 in [facility] Office Visit from 11/12/2020 in [facility] Visit from 07/11/2020 in [facility]  ?AIMS Total Score 0 0 0  ? ?  ? ?AUDIT   ? ?[facility] Admission (Discharged) from 12/22/2017 in [facility]

## 2021-08-20 NOTE — Progress Notes (Signed)
? ?  THERAPIST PROGRESS NOTE ? ?Session Time: [id number] ? ?[facility] in office visit for patient and LCSW clinician ? ?Participation Level: Active ? ?Behavioral Response: NeatAlertDepressed ? ?Type of Therapy: Individual Therapy ? ?Treatment Goals addressed: Problem: Thought Disorders CCP Problem  1 Develop healthy thinking patterns and beliefs about self, others, and the world that lead to the alleviation and help prevent the relapse of depression or anxiety 3 out of 5 sessions documented. ?Goal: LTG: Increase coping skills to promote long-term recovery and improve ability to perform daily activities ?Outcome: Progressing ?Goal: STG: Promote adherence to treatment regimen ?Outcome: Progressing ?Intervention: Encourage verbalization of feelings/concerns/expectations ?Note: explored ?Intervention: Encourage self-care activities ?Note: encouraged ?Intervention: Encourage patient to identify triggers ?Note: Pt able to identify ?Intervention: REVIEW PLEASE SKILLS (TREAT PHYSICAL ILLNESS, BALANCE EATING, AVOID MOOD-ALTERING SUBSTANCES, BALANCE SLEEP AND GET EXERCISE) WITH [name] ?Note: reviewed ? ?ProgressTowards Goals: Progressing ? ?Interventions: Solution Focused and Supportive ? ?Summary: Patricia Good is a 61 y.o. female who presents with symptoms consistent with schizoaffective disorder Pt reports that overall her mood has been stable and that she is managing stress and anxiety well .  ? ?Allowed pt to explore and express thoughts and feelings associated with recent life situations and external stressors. Pt reports that the primary stressor currently is that pt is getting pressure from her mother to move back in with her. Allowed pt to explore pros and cons of both decisions: staying with her mom versus staying by herself.  ? ?Continued recommendations are as follows: self care behaviors, positive social engagements, focusing on overall work/home/life balance, and focusing on positive physical and emotional wellness.   ? ?Suicidal/Homicidal: No ? ?Therapist Response: Pt is continuing to apply interventions learned in session into daily life situations. Pt is currently on track to meet goals utilizing interventions mentioned above. Personal growth and progress noted. Treatment to continue as indicated. ? ?Plan: Return again in 4 weeks. ? ?Diagnosis: Schizoaffective disorder, bipolar type ([place]) ? ?Collaboration of Care: Other pt encouraged to continue with psychiatrist of record, Dr. Ursula Alert ? ?Patient/Guardian was advised Release of Information must be obtained prior to any record release in order to collaborate their care with an outside provider. Patient/Guardian was advised if they have not already done so to contact the registration department to sign all necessary forms in order for [place] to release information regarding their care.  ? ?Consent: Patient/Guardian gives verbal consent for treatment and assignment of benefits for services provided during this visit. Patient/Guardian expressed understanding and agreed to proceed.  ? ?Meyer Arora R Tarren Sabree, LCSW ?08/20/2021 ? ?

## 2021-08-22 ENCOUNTER — Telehealth: Payer: Self-pay

## 2021-08-22 DIAGNOSIS — F4010 Social phobia, unspecified: Secondary | ICD-10-CM

## 2021-08-22 DIAGNOSIS — F250 Schizoaffective disorder, bipolar type: Secondary | ICD-10-CM

## 2021-08-22 DIAGNOSIS — F4310 Post-traumatic stress disorder, unspecified: Secondary | ICD-10-CM

## 2021-08-22 MED ORDER — NORTRIPTYLINE HCL 25 MG PO CAPS: 25.0000 mg | ORAL_CAPSULE | Freq: Every day | ORAL | 1 refills | Status: DC

## 2021-08-22 NOTE — Telephone Encounter (Signed)
Returned call to patient.  Patient reports she has been having crying spells and sleep problems.  Has tried and failed medications like mirtazapine, trazodone, doxepin. Start nortriptyline 25 mg at bedtime.  Discussed side effects, drug to drug interaction.  Patient advised to go to the nearest emergency department if her symptoms gets worse.  Patient currently denies any suicidality or perceptual disturbances or homicidality.

## 2021-08-22 NOTE — Telephone Encounter (Signed)
pt called left message that her  depression medications is not working

## 2021-08-29 ENCOUNTER — Encounter: Payer: Self-pay | Admitting: Psychiatry

## 2021-08-29 ENCOUNTER — Telehealth (INDEPENDENT_AMBULATORY_CARE_PROVIDER_SITE_OTHER): Payer: Medicare Other | Admitting: Psychiatry

## 2021-08-29 DIAGNOSIS — F250 Schizoaffective disorder, bipolar type: Secondary | ICD-10-CM | POA: Diagnosis not present

## 2021-08-29 DIAGNOSIS — F4010 Social phobia, unspecified: Secondary | ICD-10-CM

## 2021-08-29 DIAGNOSIS — F17200 Nicotine dependence, unspecified, uncomplicated: Secondary | ICD-10-CM

## 2021-08-29 DIAGNOSIS — F5105 Insomnia due to other mental disorder: Secondary | ICD-10-CM | POA: Diagnosis not present

## 2021-08-29 DIAGNOSIS — F4310 Post-traumatic stress disorder, unspecified: Secondary | ICD-10-CM

## 2021-08-29 MED ORDER — BUPROPION HCL ER (XL) 150 MG PO TB24: 150.0000 mg | ORAL_TABLET | Freq: Every day | ORAL | 0 refills | Status: DC

## 2021-08-29 MED ORDER — NORTRIPTYLINE HCL 50 MG PO CAPS: 50.0000 mg | ORAL_CAPSULE | Freq: Every day | ORAL | 0 refills | Status: DC

## 2021-08-29 NOTE — Progress Notes (Signed)
Virtual Visit via Video Note  I connected with Patricia Good on 08/29/21 at 11:40 AM EDT by a video enabled telemedicine application and verified that I am speaking with the correct person using two identifiers.  Location Provider Location : [facility] Patient Location : Home  Participants: Patient , Provider   I discussed the limitations of evaluation and management by telemedicine and the availability of in person appointments. The patient expressed understanding and agreed to proceed.   I discussed the assessment and treatment plan with the patient. The patient was provided an opportunity to ask questions and all were answered. The patient agreed with the plan and demonstrated an understanding of the instructions.   The patient was advised to call back or seek an in-person evaluation if the symptoms worsen or if the condition fails to improve as anticipated.   Bret Harte MD OP Progress Note  08/29/2021 11:12 AM Patricia Good  MRN:  [id number]  Chief Complaint:  Chief Complaint  Patient presents with   Follow-up: 61 year old female, divorced, has a history of schizoaffective disorder, social anxiety disorder, PTSD, insomnia, tobacco use disorder presented for medication management.   HPI: Patricia Good is a 61 year old Caucasian female who has a history of schizoaffective disorder, social anxiety disorder, PTSD, COPD, vitamin B12 deficiency, psoriasis, history of malignant melanoma, hypothyroidism, osteoarthritis was evaluated by telemedicine today.  Patient recently called the clinic reporting worsening mood symptoms and sleep problems and was started on nortriptyline 25 mg.  Patient today returns for a follow-up visit.  Patient today reports she was able to sleep well last night.  That was the first night that she actually slept.  She continues to have mood swings.  Some days she is anxious and restless and fidgety and other days she is withdrawn and feels hopeless.  Patient also reports  reduced appetite.  She has to force herself to eat.  Patient does not know if she has lost any weight however reports she has been walking more frequently to lose weight.  She denies any suicidality, homicidality or perceptual disturbances.  Patient is motivated to stay in psychotherapy.  Denies any side effects to her medications.  Denies any other concerns today.  Visit Diagnosis:    ICD-10-CM   1. Schizoaffective disorder, bipolar type ([place])  F25.0 buPROPion (WELLBUTRIN XL) 150 MG 24 hr tablet    nortriptyline (PAMELOR) 50 MG capsule    2. PTSD (post-traumatic stress disorder)  F43.10 buPROPion (WELLBUTRIN XL) 150 MG 24 hr tablet    nortriptyline (PAMELOR) 50 MG capsule    3. Social anxiety disorder  F40.10     4. Insomnia due to mental condition  F51.05    mood    5. Tobacco use disorder  F17.200       Past Psychiatric History: Reviewed past psychiatric history from progress note on 07/16/2017.  Past trials of Prozac, trazodone.  Past Medical History:  Past Medical History:  Diagnosis Date   Abnormal thyroid stimulating hormone (TSH) level    ADHD (attention deficit hyperactivity disorder)    Anxiety    Asthma    Diabetes mellitus, type II ([facility])    PTSD (post-traumatic stress disorder)    Schizoaffective disorder ([place])     Past Surgical History:  Procedure Laterality Date   ABDOMINAL HYSTERECTOMY     ECTOPIC PREGNANCY SURGERY      Family Psychiatric History: Reviewed family psychiatric history from progress note on 07/16/2017.  Family History:  Family History  Problem Relation Age  of Onset   Hypertension Father    Diabetes Father    Heart disease Father    Blindness Father    Obesity Brother    Arthritis Brother    Alcohol abuse Brother    Drug abuse Brother    Depression Brother    Alcohol abuse Sister    Drug abuse Sister    Anxiety disorder Sister    Depression Sister    Breast cancer Neg Hx     Social History: Reviewed social history from  progress note on 07/16/2017. Social History   Socioeconomic History   Marital status: Single    Spouse name: Not on file   Number of children: 0   Years of education: Not on file   Highest education level: Associate degree: occupational, Hotel manager, or vocational program  Occupational History   Not on file  Tobacco Use   Smoking status: Some Days    Packs/day: 0.50    Types: Cigarettes    Start date: 01/22/1969   Smokeless tobacco: Never   Tobacco comments:    3-5 cigarettes per day reported 04/10/2021  Vaping Use   Vaping Use: Never used  Substance and Sexual Activity   Alcohol use: Yes    Alcohol/week: 1.0 - 2.0 standard drink    Types: 1 Glasses of wine per week   Drug use: No   Sexual activity: Not Currently  Other Topics Concern   Not on file  Social History Narrative   Not on file   Social Determinants of Health   Financial Resource Strain: Not on file  Food Insecurity: Not on file  Transportation Needs: Not on file  Physical Activity: Not on file  Stress: Not on file  Social Connections: Not on file    Allergies:  Allergies  Allergen Reactions   Shellfish Allergy Anaphylaxis    Only to crab meat (non-imitation), not shrimp   Sulfa Antibiotics Hives, Anaphylaxis and Other (See Comments)   Sulfasalazine Anaphylaxis and Hives    Other reaction(s): Other (See Comments)   Lisinopril     Metabolic Disorder Labs: Lab Results  Component Value Date   HGBA1C 5.7 (H) 05/06/2020   MPG 116.89 05/06/2020   MPG 117 12/21/2017   Lab Results  Component Value Date   PROLACTIN 1.3 (L) 05/06/2020   PROLACTIN 1.2 (L) 07/12/2017   Lab Results  Component Value Date   CHOL 193 05/06/2020   TRIG 103 05/06/2020   HDL 56 05/06/2020   CHOLHDL 3.4 05/06/2020   VLDL 21 05/06/2020   LDLCALC 116 (H) 05/06/2020   LDLCALC 163 (H) 12/21/2017   Lab Results  Component Value Date   TSH 2.250 06/17/2021   TSH 2.143 05/06/2020    Therapeutic Level Labs: No results found  for: LITHIUM No results found for: VALPROATE No components found for:  CBMZ  Current Medications: Current Outpatient Medications  Medication Sig Dispense Refill   buPROPion (WELLBUTRIN XL) 150 MG 24 hr tablet Take 1 tablet (150 mg total) by mouth daily. 90 tablet 0   nortriptyline (PAMELOR) 50 MG capsule Take 1 capsule (50 mg total) by mouth at bedtime. 90 capsule 0   alendronate (FOSAMAX) 70 MG tablet Take 70 mg by mouth once a week.     amLODipine (NORVASC) 10 MG tablet Take 10 mg by mouth daily.     ARIPiprazole (ABILIFY) 30 MG tablet Take 1 tablet (30 mg total) by mouth daily. 90 tablet 0   atorvastatin (LIPITOR) 20 MG tablet Take 20 mg  by mouth at bedtime.     B Complex-C (B-COMPLEX WITH VITAMIN C) tablet Take 1 tablet by mouth daily.     calcium-vitamin D (OSCAL WITH D) 500-200 MG-UNIT tablet Take 1 tablet by mouth daily with breakfast.     furosemide (LASIX) 20 MG tablet Take 20 mg by mouth daily.      hydrOXYzine (ATARAX) 10 MG tablet Take 2 tablets (20 mg total) by mouth at bedtime as needed. for sleep and anxiety 180 tablet 0   levothyroxine (SYNTHROID, LEVOTHROID) 112 MCG tablet      meloxicam (MOBIC) 15 MG tablet Take 1 tablet by mouth daily.     omeprazole (PRILOSEC) 40 MG capsule Take 40 mg by mouth daily.      prazosin (MINIPRESS) 2 MG capsule Take 1 capsule (2 mg total) by mouth at bedtime. 90 capsule 1   PREVIDENT 5000 DRY MOUTH 1.1 % GEL dental gel Place onto teeth.     SPIRIVA HANDIHALER 18 MCG inhalation capsule      valACYclovir (VALTREX) 1000 MG tablet      No current facility-administered medications for this visit.     Musculoskeletal: Strength & Muscle Tone:  UTA Gait & Station:  Seated Patient leans: N/A  Psychiatric Specialty Exam: Review of Systems  Constitutional:  Positive for appetite change.  Psychiatric/Behavioral:  Positive for dysphoric mood and sleep disturbance (improving). The patient is nervous/anxious.   All other systems reviewed and are  negative.  There were no vitals taken for this visit.There is no height or weight on file to calculate BMI.  General Appearance: Casual  Eye Contact:  Good  Speech:  Normal Rate  Volume:  Normal  Mood:  Anxious and Depressed-some improvement  Affect:  Congruent  Thought Process:  Goal Directed and Descriptions of Associations: Intact  Orientation:  Full (Time, Place, and Person)  Thought Content: Rumination   Suicidal Thoughts:  No  Homicidal Thoughts:  No  Memory:  Immediate;   Fair Recent;   Fair Remote;   Fair  Judgement:  Fair  Insight:  Fair  Psychomotor Activity:  Normal  Concentration:  Concentration: Fair and Attention Span: Fair  Recall:  [employer] of Knowledge: Fair  Language: Fair  Akathisia:  No  Handed:  Right  AIMS (if indicated): done  Assets:  Communication Skills Desire for Improvement Social Support Talents/Skills  ADL's:  Intact  Cognition: WNL  Sleep:   Improving   Screenings: AIMS    Flowsheet Row Video Visit from 08/29/2021 in [facility] Office Visit from 08/20/2021 in [facility] Visit from 11/12/2020 in [facility] Visit from 07/11/2020 in [facility] Total Score 0 0 0 0      AUDIT    Flowsheet Row Admission (Discharged) from 12/22/2017 in [facility]  Alcohol Use Disorder Identification Test Final Score (AUDIT) 1      [place] Office Visit from 06/05/2021 in [facility]  Total GAD-7 Score 7      PHQ2-9    [place] Video Visit from 08/29/2021 in [facility] Most recent reading at 08/29/2021 10:56 AM Office Visit from 08/20/2021 in [facility] Most recent reading at 08/20/2021  9:20 AM Office Visit from 06/05/2021 in [facility] Most recent reading at  06/06/2021 12:28 PM Office Visit from 06/05/2021 in [facility] Most recent reading at  06/05/2021  9:17 AM Video Visit from 09/10/2020 in [facility] Most recent reading at 09/10/2020 11:41 AM  PHQ-2 Total Score '4 3 1 '$ 0 1  PHQ-9 Total Score '14 8 15 '$ -- --      Flowsheet Row Video Visit from 08/29/2021 in [facility] Most recent reading at 08/29/2021 11:11 AM Office Visit from 06/05/2021 in [facility] Most recent reading at 06/06/2021 12:28 PM Office Visit from 06/05/2021 in [facility] Most recent reading at 06/05/2021  9:17 AM  C-SSRS RISK CATEGORY No Risk No Risk No Risk        Assessment and Plan: Patricia Good is a 61 year old Caucasian female, divorced, on SSD, currently lives in [place], has a history of schizoaffective disorder, social anxiety, PTSD, multiple medical problems was evaluated by telemedicine today.  Patient currently continues to have mood swings, appetite changes, will continue to benefit from medication changes.  Plan Schizoaffective disorder-unstable Abilify 30 mg p.o. daily Reduce Wellbutrin XL to 150 milligram p.o. daily-patient with reduced appetite, unknown if Wellbutrin is contributing. Increase nortriptyline to 50 mg p.o. nightly.   PTSD-improving Abilify and Wellbutrin as prescribed Patient to continue CBT with Ms. Christina Hussami Prazosin 2 mg p.o. nightly Hydroxyzine 10 to 20 mg at bedtime as needed for anxiety and sleep  Tobacco use disorder-improving Provided counseling for 1 minute.  Follow-up in clinic in 3 to 4 weeks or sooner if needed.    Collaboration of Care: Collaboration of Care: Referral or follow-up with counselor/therapist AEB encouraged to continue to follow-up with therapist.  Patient/Guardian was advised Release of Information must be obtained prior to any record release in order to collaborate  their care with an outside provider. Patient/Guardian was advised if they have not already done so to contact the registration department to sign all necessary forms in order for [place] to release information regarding their care.   Consent: Patient/Guardian gives verbal consent for treatment and assignment of benefits for services provided during this visit. Patient/Guardian expressed understanding and agreed to proceed.   This note was generated in part or whole with voice recognition software. Voice recognition is usually quite accurate but there are transcription errors that can and very often do occur. I apologize for any typographical errors that were not detected and corrected.      Ursula Alert, MD 08/29/2021, 11:12 AM

## 2021-09-08 DIAGNOSIS — N1831 Chronic kidney disease, stage 3a: Secondary | ICD-10-CM | POA: Insufficient documentation

## 2021-09-18 ENCOUNTER — Other Ambulatory Visit: Payer: Self-pay | Admitting: Internal Medicine

## 2021-09-18 DIAGNOSIS — F320 Major depressive disorder, single episode, mild: Secondary | ICD-10-CM | POA: Insufficient documentation

## 2021-09-18 DIAGNOSIS — I700 Atherosclerosis of aorta: Secondary | ICD-10-CM | POA: Insufficient documentation

## 2021-09-18 DIAGNOSIS — N1831 Chronic kidney disease, stage 3a: Secondary | ICD-10-CM

## 2021-09-23 ENCOUNTER — Ambulatory Visit
Admission: RE | Admit: 2021-09-23 | Discharge: 2021-09-23 | Disposition: A | Discharge status: Home / Self Care | Payer: Medicare Other | Source: Ambulatory Visit | Attending: Internal Medicine | Admitting: Internal Medicine

## 2021-09-23 DIAGNOSIS — N1831 Chronic kidney disease, stage 3a: Secondary | ICD-10-CM | POA: Insufficient documentation

## 2021-09-24 ENCOUNTER — Ambulatory Visit (INDEPENDENT_AMBULATORY_CARE_PROVIDER_SITE_OTHER): Payer: Medicare Other | Admitting: Psychiatry

## 2021-09-24 ENCOUNTER — Encounter: Payer: Self-pay | Admitting: Psychiatry

## 2021-09-24 VITALS — BP 117/67 | HR 91 | Temp 98.1°F | Wt 211.4 lb

## 2021-09-24 DIAGNOSIS — F4310 Post-traumatic stress disorder, unspecified: Secondary | ICD-10-CM | POA: Diagnosis not present

## 2021-09-24 DIAGNOSIS — F4010 Social phobia, unspecified: Secondary | ICD-10-CM

## 2021-09-24 DIAGNOSIS — F5105 Insomnia due to other mental disorder: Secondary | ICD-10-CM

## 2021-09-24 DIAGNOSIS — F17200 Nicotine dependence, unspecified, uncomplicated: Secondary | ICD-10-CM

## 2021-09-24 DIAGNOSIS — F250 Schizoaffective disorder, bipolar type: Secondary | ICD-10-CM | POA: Diagnosis not present

## 2021-09-24 MED ORDER — BUPROPION HCL ER (XL) 300 MG PO TB24: 300.0000 mg | ORAL_TABLET | Freq: Every day | ORAL | 0 refills | Status: DC

## 2021-09-24 MED ORDER — ARIPIPRAZOLE 20 MG PO TABS: 20.0000 mg | ORAL_TABLET | Freq: Every day | ORAL | 0 refills | Status: DC

## 2021-09-24 NOTE — Progress Notes (Signed)
[facility] MD OP Progress Note  09/24/2021 4:48 PM Patricia Good  MRN:  [id number]  Chief Complaint:  Chief Complaint  Patient presents with   Follow-up: 61 year old female, divorced, has a history of schizoaffective disorder, social anxiety disorder, PTSD, insomnia, tobacco use disorder, presented for medication management.   HPI: Patricia Good is a 61 year old Caucasian female who has a history of schizoaffective disorder, social anxiety disorder, PTSD, COPD, vitamin B12 deficiency, psoriasis, history of malignant melanoma, hypothyroidism, osteoarthritis was evaluated in office today.  Patient today reports she is currently struggling with sadness, low motivation, low energy.  This has been getting worse since the past few days.  Patient does report situational stressors of conflict with her neighbor, relationship struggles with her mother.  Patient also reports she is trying to move to a new apartment and that is also anxiety provoking for her.  Patient reports sleep is overall good.  Reports appetite as fair.  Denies any suicidality, homicidality or perceptual disturbances.  Currently compliant on medications.  Denies side effects.  Patient continues to follow-up with her therapist, motivated to stay in therapy.  Continues to smoke cigarettes.  Receptive to counseling.  Denies any other concerns today.  Visit Diagnosis:    ICD-10-CM   1. Schizoaffective disorder, bipolar type ([place])  F25.0 ARIPiprazole (ABILIFY) 20 MG tablet    buPROPion (WELLBUTRIN XL) 300 MG 24 hr tablet    2. PTSD (post-traumatic stress disorder)  F43.10 ARIPiprazole (ABILIFY) 20 MG tablet    buPROPion (WELLBUTRIN XL) 300 MG 24 hr tablet    3. Social anxiety disorder  F40.10     4. Insomnia due to mental condition  F51.05    mood    5. Tobacco use disorder  F17.200       Past Psychiatric History: Reviewed past psychiatric history from progress note on 07/16/2017.  Past trials of Prozac, trazodone.  Past  Medical History:  Past Medical History:  Diagnosis Date   Abnormal thyroid stimulating hormone (TSH) level    ADHD (attention deficit hyperactivity disorder)    Anxiety    Asthma    Diabetes mellitus, type II (HCC)    PTSD (post-traumatic stress disorder)    Schizoaffective disorder ([place])     Past Surgical History:  Procedure Laterality Date   ABDOMINAL HYSTERECTOMY     ECTOPIC PREGNANCY SURGERY      Family Psychiatric History: Reviewed family psychiatric history from progress note on 07/16/2017.  Family History:  Family History  Problem Relation Age of Onset   Hypertension Father    Diabetes Father    Heart disease Father    Blindness Father    Obesity Brother    Arthritis Brother    Alcohol abuse Brother    Drug abuse Brother    Depression Brother    Alcohol abuse Sister    Drug abuse Sister    Anxiety disorder Sister    Depression Sister    Breast cancer Neg Hx     Social History: Reviewed social history from progress note on 07/16/2017. Social History   Socioeconomic History   Marital status: Single    Spouse name: Not on file   Number of children: 0   Years of education: Not on file   Highest education level: Associate degree: occupational, Hotel manager, or vocational program  Occupational History   Not on file  Tobacco Use   Smoking status: Some Days    Packs/day: 0.50    Types: Cigarettes    Start date:  01/22/1969   Smokeless tobacco: Never   Tobacco comments:    3-5 cigarettes per day reported 04/10/2021  Vaping Use   Vaping Use: Never used  Substance and Sexual Activity   Alcohol use: Yes    Alcohol/week: 1.0 - 2.0 standard drink of alcohol    Types: 1 Glasses of wine per week   Drug use: No   Sexual activity: Not Currently  Other Topics Concern   Not on file  Social History Narrative   Not on file   Social Determinants of Health   Financial Resource Strain: Medium Risk (06/15/2017)   Overall Financial Resource Strain (CARDIA)    Difficulty  of Paying Living Expenses: Somewhat hard  Food Insecurity: Food Insecurity Present (06/15/2017)   Hunger Vital Sign    Worried About Running Out of Food in the Last Year: Sometimes true    Ran Out of Food in the Last Year: Sometimes true  Transportation Needs: No Transportation Needs (06/15/2017)   PRAPARE - Hydrologist (Medical): No    Lack of Transportation (Non-Medical): No  Physical Activity: Inactive (06/15/2017)   Exercise Vital Sign    Days of Exercise per Week: 0 days    Minutes of Exercise per Session: 0 min  Stress: Stress Concern Present (06/15/2017)   [place]    Feeling of Stress : Very much  Social Connections: Socially Isolated (06/15/2017)   Social Connection and Isolation Panel [NHANES]    Frequency of Communication with Friends and Family: Never    Frequency of Social Gatherings with Friends and Family: Never    Attends Religious Services: Never    Marine scientist or Organizations: No    Attends Archivist Meetings: Never    Marital Status: Divorced    Allergies:  Allergies  Allergen Reactions   Shellfish Allergy Anaphylaxis    Only to crab meat (non-imitation), not shrimp   Sulfa Antibiotics Hives, Anaphylaxis and Other (See Comments)   Sulfasalazine Anaphylaxis and Hives    Other reaction(s): Other (See Comments)   Lisinopril     Metabolic Disorder Labs: Lab Results  Component Value Date   HGBA1C 5.7 (H) 05/06/2020   MPG 116.89 05/06/2020   MPG 117 12/21/2017   Lab Results  Component Value Date   PROLACTIN 1.3 (L) 05/06/2020   PROLACTIN 1.2 (L) 07/12/2017   Lab Results  Component Value Date   CHOL 193 05/06/2020   TRIG 103 05/06/2020   HDL 56 05/06/2020   CHOLHDL 3.4 05/06/2020   VLDL 21 05/06/2020   LDLCALC 116 (H) 05/06/2020   LDLCALC 163 (H) 12/21/2017   Lab Results  Component Value Date   TSH 2.250 06/17/2021   TSH 2.143  05/06/2020    Therapeutic Level Labs: No results found for: "LITHIUM" No results found for: "VALPROATE" No results found for: "CBMZ"  Current Medications: Current Outpatient Medications  Medication Sig Dispense Refill   alendronate (FOSAMAX) 70 MG tablet Take 70 mg by mouth once a week.     amLODipine (NORVASC) 10 MG tablet Take 10 mg by mouth daily.     ARIPiprazole (ABILIFY) 20 MG tablet Take 1 tablet (20 mg total) by mouth daily. 90 tablet 0   atorvastatin (LIPITOR) 20 MG tablet Take 20 mg by mouth at bedtime.     B Complex-C (B-COMPLEX WITH VITAMIN C) tablet Take 1 tablet by mouth daily.     buPROPion (WELLBUTRIN XL) 300 MG  24 hr tablet Take 1 tablet (300 mg total) by mouth daily. 90 tablet 0   calcium-vitamin D (OSCAL WITH D) 500-200 MG-UNIT tablet Take 1 tablet by mouth daily with breakfast.     furosemide (LASIX) 20 MG tablet Take 20 mg by mouth daily.      hydrOXYzine (ATARAX) 10 MG tablet Take 2 tablets (20 mg total) by mouth at bedtime as needed. for sleep and anxiety 180 tablet 0   levothyroxine (SYNTHROID, LEVOTHROID) 112 MCG tablet      meloxicam (MOBIC) 15 MG tablet Take 1 tablet by mouth daily.     nortriptyline (PAMELOR) 50 MG capsule Take 1 capsule (50 mg total) by mouth at bedtime. 90 capsule 0   omeprazole (PRILOSEC) 40 MG capsule Take 40 mg by mouth daily.      prazosin (MINIPRESS) 2 MG capsule Take 1 capsule (2 mg total) by mouth at bedtime. 90 capsule 1   PREVIDENT 5000 DRY MOUTH 1.1 % GEL dental gel Place onto teeth.     SPIRIVA HANDIHALER 18 MCG inhalation capsule      valACYclovir (VALTREX) 1000 MG tablet      No current facility-administered medications for this visit.     Musculoskeletal: Strength & Muscle Tone: within normal limits Gait & Station: normal Patient leans: N/A  Psychiatric Specialty Exam: Review of Systems  Musculoskeletal:  Positive for back pain.       Left lower side back pain  Psychiatric/Behavioral:  Positive for decreased  concentration and dysphoric mood. The patient is nervous/anxious.   All other systems reviewed and are negative.   Blood pressure 117/67, pulse 91, temperature 98.1 F (36.7 C), temperature source Temporal, weight 211 lb 6.4 oz (95.9 kg).Body mass index is 39.94 kg/m.  General Appearance: Casual  Eye Contact:  Good  Speech:  Clear and Coherent  Volume:  Normal  Mood:  Anxious and Depressed  Affect:  Congruent  Thought Process:  Goal Directed and Descriptions of Associations: Intact  Orientation:  Full (Time, Place, and Person)  Thought Content: Logical   Suicidal Thoughts:  No  Homicidal Thoughts:  No  Memory:  Immediate;   Fair Recent;   Fair Remote;   Fair  Judgement:  Fair  Insight:  Fair  Psychomotor Activity:  Normal  Concentration:  Concentration: Fair and Attention Span: Fair  Recall:  [employer] of Knowledge: Fair  Language: Fair  Akathisia:  No  Handed:  Right  AIMS (if indicated): done  Assets:  Communication Skills Desire for Improvement Housing Social Support Transportation  ADL's:  Intact  Cognition: WNL  Sleep:  Fair   Screenings: [place] Office Visit from 09/24/2021 in [facility] Video Visit from 08/29/2021 in [facility] Visit from 08/20/2021 in [facility] Visit from 11/12/2020 in [facility] Visit from 07/11/2020 in [facility] Total Score 0 0 0 0 0      AUDIT    Flowsheet Row Admission (Discharged) from 12/22/2017 in [facility]  Alcohol Use Disorder Identification Test Final Score (AUDIT) 1      GAD-7    Flowsheet Row Office Visit from 09/24/2021 in [facility] Office Visit from 06/05/2021 in [facility]  Total GAD-7 Score 5 7      PHQ2-9    [facility] Visit from 09/24/2021  in [place] Most recent reading  at 09/24/2021  1:20 PM Video Visit from 08/29/2021 in [facility] Most recent reading at 08/29/2021 10:56 AM Office Visit from 08/20/2021 in [facility] Most recent reading at 08/20/2021  9:20 AM Office Visit from 06/05/2021 in [facility] Most recent reading at 06/06/2021 12:28 PM Office Visit from 06/05/2021 in [facility] Most recent reading at 06/05/2021  9:17 AM  PHQ-2 Total Score '5 4 3 1 '$ 0  PHQ-9 Total Score '17 14 8 15 '$ --      [place] Visit from 09/24/2021 in [facility] Video Visit from 08/29/2021 in [facility] Office Visit from 06/05/2021 in [facility] No Risk No Risk No Risk        Assessment and Plan: Patricia Good is a 61 year old Caucasian female, divorced, on SSD, currently lives in [place], has a history of schizoaffective disorder, social anxiety, PTSD, currently struggling with depressive symptoms as well as anxiety, will benefit from the following plan.  Plan Schizoaffective disorder-unstable Reduce Abilify to 20 mg p.o. daily Increase Wellbutrin XL to 300 mg p.o. daily Nortriptyline 50 mg p.o. nightly.  PTSD-improving Nortriptyline as prescribed Prazosin 2 mg p.o. nightly Hydroxyzine 10 - 20 mg at bedtime as needed for anxiety and sleep Continue CBT with Ms. Christina Hussami   Tobacco use disorder-improving Provided counseling for 1 minute.  Follow-up in clinic in 2 weeks or sooner if needed.  This note was generated in part or whole with voice recognition software. Voice recognition is usually quite accurate but there are transcription errors that can and very often do occur. I apologize for any typographical errors that were not detected and corrected.     Ursula Alert, MD 09/24/2021, 4:48 PM

## 2021-10-08 ENCOUNTER — Ambulatory Visit (HOSPITAL_COMMUNITY): Payer: Medicare Other | Admitting: Licensed Clinical Social Worker

## 2021-10-16 ENCOUNTER — Encounter: Payer: Self-pay | Admitting: Psychiatry

## 2021-10-16 ENCOUNTER — Ambulatory Visit (INDEPENDENT_AMBULATORY_CARE_PROVIDER_SITE_OTHER): Payer: Medicare Other | Admitting: Psychiatry

## 2021-10-16 VITALS — BP 125/78 | HR 87 | Temp 98.1°F | Wt 213.0 lb

## 2021-10-16 DIAGNOSIS — F250 Schizoaffective disorder, bipolar type: Secondary | ICD-10-CM | POA: Diagnosis not present

## 2021-10-16 DIAGNOSIS — F4010 Social phobia, unspecified: Secondary | ICD-10-CM

## 2021-10-16 DIAGNOSIS — F5105 Insomnia due to other mental disorder: Secondary | ICD-10-CM

## 2021-10-16 DIAGNOSIS — F4310 Post-traumatic stress disorder, unspecified: Secondary | ICD-10-CM | POA: Diagnosis not present

## 2021-10-16 DIAGNOSIS — F17200 Nicotine dependence, unspecified, uncomplicated: Secondary | ICD-10-CM

## 2021-10-16 DIAGNOSIS — Z9189 Other specified personal risk factors, not elsewhere classified: Secondary | ICD-10-CM

## 2021-10-16 NOTE — Patient Instructions (Signed)
Please call for [phone] 

## 2021-10-16 NOTE — Progress Notes (Signed)
[facility] MD OP Progress Note  10/16/2021 4:07 PM Patricia Good  MRN:  [id number]  Chief Complaint:  Chief Complaint  Patient presents with   Follow-up: 61 year old female, has a history of schizoaffective disorder, social anxiety disorder, PTSD, tobacco use disorder, presented for medication management.   HPI: Patricia Good is a 61 year old Caucasian female who has a history of schizoaffective disorder, social anxiety disorder, PTSD, COPD, vitamin B12 deficiency, psoriasis, history of malignant melanoma, hypothyroidism, osteoarthritis was evaluated in office today.  Patient was just seen on 09/24/2021.  At that visit patient had medication readjustments, Abilify was reduced to 20 mg and Wellbutrin was increased to 300 mg.  Patient today returns reporting headaches, and feeling sluggish and shaky.  Patient reports she may have been taking more than prescribed dosage of Abilify by mistake.  Patient reports instead of stopping Abilify 30 mg she has been combining the Abilify 20 mg with her previous Abilify 30 mg dose.  She has been doing it since the past visit.  This likely contributing to her sluggishness and internal restlessness.  Patient reports psychosocial stressors of relationship struggles with her mother.  She is currently not compliant therapy and is not interested in referral for intensive outpatient program.   Patient reports sleep is good.  Denies any suicidality, homicidality or perceptual disturbances.  Patient had AIMS scale done in session today - she scored zero.  Patient denies any chest pain, muscle cramps or any other physical symptoms.  Agreeable to go to the nearest emergency department if she has any worsening headache or other symptoms from being on the higher dosage of Abilify.  Patient denies any other concerns today.  Visit Diagnosis:    ICD-10-CM   1. Schizoaffective disorder, bipolar type ([place])  F25.0 EKG 12-Lead    2. PTSD (post-traumatic stress disorder)  F43.10      3. Social anxiety disorder  F40.10     4. Insomnia due to mental condition  F51.05    Mood    5. Tobacco use disorder  F17.200     6. At risk for prolonged QT interval syndrome  Z91.89 EKG 12-Lead      Past Psychiatric History: Reviewed past psychiatric history from progress note on 07/16/2017.  Past trials of Prozac, trazodone.  Past Medical History:  Past Medical History:  Diagnosis Date   Abnormal thyroid stimulating hormone (TSH) level    ADHD (attention deficit hyperactivity disorder)    Anxiety    Asthma    Diabetes mellitus, type II (HCC)    PTSD (post-traumatic stress disorder)    Schizoaffective disorder ([place])     Past Surgical History:  Procedure Laterality Date   ABDOMINAL HYSTERECTOMY     ECTOPIC PREGNANCY SURGERY      Family Psychiatric History: Reviewed family psychiatric history from progress note on 07/16/2017.  Family History:  Family History  Problem Relation Age of Onset   Hypertension Father    Diabetes Father    Heart disease Father    Blindness Father    Obesity Brother    Arthritis Brother    Alcohol abuse Brother    Drug abuse Brother    Depression Brother    Alcohol abuse Sister    Drug abuse Sister    Anxiety disorder Sister    Depression Sister    Breast cancer Neg Hx     Social History: Reviewed social history from progress note on 07/16/2017. Social History   Socioeconomic History   Marital status: Single  Spouse name: Not on file   Number of children: 0   Years of education: Not on file   Highest education level: Associate degree: occupational, technical, or vocational program  Occupational History   Not on file  Tobacco Use   Smoking status: Some Days    Packs/day: 0.50    Types: Cigarettes    Start date: 01/22/1969   Smokeless tobacco: Never   Tobacco comments:    3-5 cigarettes per day reported 04/10/2021  Vaping Use   Vaping Use: Never used  Substance and Sexual Activity   Alcohol use: Yes    Alcohol/week:  1.0 - 2.0 standard drink of alcohol    Types: 1 Glasses of wine per week   Drug use: No   Sexual activity: Not Currently  Other Topics Concern   Not on file  Social History Narrative   Not on file   Social Determinants of Health   Financial Resource Strain: Medium Risk (06/15/2017)   Overall Financial Resource Strain (CARDIA)    Difficulty of Paying Living Expenses: Somewhat hard  Food Insecurity: Food Insecurity Present (06/15/2017)   Hunger Vital Sign    Worried About Running Out of Food in the Last Year: Sometimes true    Ran Out of Food in the Last Year: Sometimes true  Transportation Needs: No Transportation Needs (06/15/2017)   PRAPARE - Hydrologist (Medical): No    Lack of Transportation (Non-Medical): No  Physical Activity: Inactive (06/15/2017)   Exercise Vital Sign    Days of Exercise per Week: 0 days    Minutes of Exercise per Session: 0 min  Stress: Stress Concern Present (06/15/2017)   [place]    Feeling of Stress : Very much  Social Connections: Socially Isolated (06/15/2017)   Social Connection and Isolation Panel [NHANES]    Frequency of Communication with Friends and Family: Never    Frequency of Social Gatherings with Friends and Family: Never    Attends Religious Services: Never    Marine scientist or Organizations: No    Attends Archivist Meetings: Never    Marital Status: Divorced    Allergies:  Allergies  Allergen Reactions   Shellfish Allergy Anaphylaxis    Only to crab meat (non-imitation), not shrimp   Sulfa Antibiotics Hives, Anaphylaxis and Other (See Comments)   Sulfasalazine Anaphylaxis and Hives    Other reaction(s): Other (See Comments)   Lisinopril     Metabolic Disorder Labs: Lab Results  Component Value Date   HGBA1C 5.7 (H) 05/06/2020   MPG 116.89 05/06/2020   MPG 117 12/21/2017   Lab Results  Component Value Date    PROLACTIN 1.3 (L) 05/06/2020   PROLACTIN 1.2 (L) 07/12/2017   Lab Results  Component Value Date   CHOL 193 05/06/2020   TRIG 103 05/06/2020   HDL 56 05/06/2020   CHOLHDL 3.4 05/06/2020   VLDL 21 05/06/2020   LDLCALC 116 (H) 05/06/2020   LDLCALC 163 (H) 12/21/2017   Lab Results  Component Value Date   TSH 2.250 06/17/2021   TSH 2.143 05/06/2020    Therapeutic Level Labs: No results found for: "LITHIUM" No results found for: "VALPROATE" No results found for: "CBMZ"  Current Medications: Current Outpatient Medications  Medication Sig Dispense Refill   alendronate (FOSAMAX) 70 MG tablet Take 70 mg by mouth once a week.     amLODipine (NORVASC) 10 MG tablet Take 10 mg by  mouth daily.     ARIPiprazole (ABILIFY) 20 MG tablet Take 1 tablet (20 mg total) by mouth daily. 90 tablet 0   atorvastatin (LIPITOR) 20 MG tablet Take 20 mg by mouth at bedtime.     B Complex-C (B-COMPLEX WITH VITAMIN C) tablet Take 1 tablet by mouth daily.     buPROPion (WELLBUTRIN XL) 300 MG 24 hr tablet Take 1 tablet (300 mg total) by mouth daily. 90 tablet 0   calcium-vitamin D (OSCAL WITH D) 500-200 MG-UNIT tablet Take 1 tablet by mouth daily with breakfast.     furosemide (LASIX) 20 MG tablet Take 20 mg by mouth daily.      hydrOXYzine (ATARAX) 10 MG tablet Take 2 tablets (20 mg total) by mouth at bedtime as needed. for sleep and anxiety 180 tablet 0   levothyroxine (SYNTHROID, LEVOTHROID) 112 MCG tablet      meloxicam (MOBIC) 15 MG tablet Take 1 tablet by mouth daily.     nortriptyline (PAMELOR) 50 MG capsule Take 1 capsule (50 mg total) by mouth at bedtime. 90 capsule 0   omeprazole (PRILOSEC) 40 MG capsule Take 40 mg by mouth daily.      prazosin (MINIPRESS) 2 MG capsule Take 1 capsule (2 mg total) by mouth at bedtime. 90 capsule 1   PREVIDENT 5000 DRY MOUTH 1.1 % GEL dental gel Place onto teeth.     SPIRIVA HANDIHALER 18 MCG inhalation capsule      valACYclovir (VALTREX) 1000 MG tablet      No  current facility-administered medications for this visit.     Musculoskeletal: Strength & Muscle Tone: within normal limits Gait & Station: normal Patient leans: N/A  Psychiatric Specialty Exam: Review of Systems  Neurological:  Positive for tremors (bl hands - chronic).  Psychiatric/Behavioral:  Positive for decreased concentration and dysphoric mood.   All other systems reviewed and are negative.   Blood pressure 125/78, pulse 87, temperature 98.1 F (36.7 C), temperature source Temporal, weight 213 lb (96.6 kg).Body mass index is 40.25 kg/m.  General Appearance: Casual  Eye Contact:  Fair  Speech:  Clear and Coherent  Volume:  Normal  Mood:  Dysphoric  Affect:  Depressed  Thought Process:  Goal Directed and Descriptions of Associations: Intact  Orientation:  Full (Time, Place, and Person)  Thought Content: Logical   Suicidal Thoughts:  No  Homicidal Thoughts:  No  Memory:  Immediate;   Fair Recent;   Fair Remote;   Fair  Judgement:  Fair  Insight:  Shallow  Psychomotor Activity:  Decreased, tremors, chronic ,fine  Concentration:  Concentration: Fair and Attention Span: Fair  Recall:  [employer] of Knowledge: Fair  Language: Fair  Akathisia:  No  Handed:  Right  AIMS (if indicated): done  Assets:  Communication Skills Desire for Improvement Housing Transportation  ADL's:  Intact  Cognition: WNL  Sleep:  Poor   Screenings: [place] Office Visit from 10/16/2021 in [facility] Office Visit from 09/24/2021 in [facility] Video Visit from 08/29/2021 in [facility] Visit from 08/20/2021 in [facility] Visit from 11/12/2020 in [facility] Total Score 0 0 0 0 0      AUDIT    Flowsheet Row Admission (Discharged) from 12/22/2017 in [facility]  Alcohol Use Disorder  Identification Test Final Score (AUDIT) 1      GAD-7    Flowsheet  Row Office Visit from 09/24/2021 in [facility] Visit from 06/05/2021 in [facility]  Total GAD-7 Score 5 7      PHQ2-9    [facility] Office Visit from 10/16/2021 in [facility] Office Visit from 09/24/2021 in [place] Video Visit from 08/29/2021 in [facility] Office Visit from 08/20/2021 in [facility] Office Visit from 06/05/2021 in [facility]  PHQ-2 Total Score '3 5 4 3 1  '$ PHQ-9 Total Score '7 17 14 8 15      '$ [facility] Office Visit from 10/16/2021 in [facility] Office Visit from 09/24/2021 in [facility] Video Visit from 08/29/2021 in [facility] No Risk No Risk No Risk        Assessment and Plan: Patricia Good is a 61 year old Caucasian female, divorced, on SSD, currently lives in [place], has a history of schizoaffective disorder, social anxiety, PTSD, currently struggling with possible adverse side effects to being on a higher dosage of Abilify than prescribed which she started taking by mistake.  Discussed plan as noted below.  Plan Schizoaffective disorder-unstable Patient advised to reduce Abilify to 20 mg p.o. daily.  Patient advised to go to the nearest emergency department if she notices any worsening symptoms physically. Continue Wellbutrin XL 300 mg p.o. daily Nortriptyline 50 mg p.o. nightly  PTSD-improving Nortriptyline as prescribed Prazosin 2 mg p.o. nightly Hydroxyzine 10 to 20 mg p.o. nightly as needed for anxiety Continue CBT with Ms. Christina Hussami.  Patient advised to contact her health insurance if she needs to find another individual therapist for more frequent  sessions. Discussed referral to IOP-patient declines  Tobacco use disorder-unstable Patient is not ready to quit.  Provided counseling for 1 minute.  At risk for prolonged QT syndrome-we will order EKG.  Provided phone #[phone].  Follow-up in clinic in 3 to 4 weeks or sooner if needed.  Crisis plan discussed with patient.   This note was generated in part or whole with voice recognition software. Voice recognition is usually quite accurate but there are transcription errors that can and very often do occur. I apologize for any typographical errors that were not detected and corrected.    Ursula Alert, MD 10/17/2021, 8:09 AM

## 2021-10-17 ENCOUNTER — Ambulatory Visit
Admission: RE | Admit: 2021-10-17 | Discharge: 2021-10-17 | Disposition: A | Discharge status: Home / Self Care | Payer: Medicare Other | Source: Ambulatory Visit | Attending: Internal Medicine | Admitting: Internal Medicine

## 2021-10-17 DIAGNOSIS — F259 Schizoaffective disorder, unspecified: Secondary | ICD-10-CM

## 2021-10-17 DIAGNOSIS — Z9189 Other specified personal risk factors, not elsewhere classified: Secondary | ICD-10-CM | POA: Diagnosis not present

## 2021-10-17 DIAGNOSIS — F250 Schizoaffective disorder, bipolar type: Secondary | ICD-10-CM | POA: Insufficient documentation

## 2021-10-27 ENCOUNTER — Telehealth: Payer: Self-pay | Admitting: Psychiatry

## 2021-10-27 NOTE — Telephone Encounter (Signed)
Received message from [place], Mendel Corning, MD   community provider ,that patient is not doing well in spite of recent medication changes.  Will have staff contact patient to make a sooner appointment.  I have also communicated with her provider through epic , [place]  to refer her for intensive outpatient program.

## 2021-11-03 ENCOUNTER — Telehealth: Payer: Self-pay

## 2021-11-03 NOTE — Telephone Encounter (Signed)
wanted to know if she can get medical merjuana that the medication is not helpping .

## 2021-11-03 NOTE — Telephone Encounter (Signed)
Please inform her that we do not prescribe medical marijuana, and it is not indicated for mood symptoms. Will defer further advice form Dr. Shea Evans at her next visit. Please let me know if she has any other questions.

## 2021-11-03 NOTE — Telephone Encounter (Signed)
patient was given that information when she call but she wanted me to ask anyway-  I called and left message that our office does not prescribe the medical merjuana.

## 2021-11-24 ENCOUNTER — Encounter: Payer: Self-pay | Admitting: Psychiatry

## 2021-11-24 ENCOUNTER — Ambulatory Visit (INDEPENDENT_AMBULATORY_CARE_PROVIDER_SITE_OTHER): Payer: Medicare Other | Admitting: Psychiatry

## 2021-11-24 VITALS — BP 103/71 | HR 90 | Temp 97.6°F | Wt 210.2 lb

## 2021-11-24 DIAGNOSIS — F5105 Insomnia due to other mental disorder: Secondary | ICD-10-CM

## 2021-11-24 DIAGNOSIS — F250 Schizoaffective disorder, bipolar type: Secondary | ICD-10-CM | POA: Diagnosis not present

## 2021-11-24 DIAGNOSIS — F4010 Social phobia, unspecified: Secondary | ICD-10-CM | POA: Diagnosis not present

## 2021-11-24 DIAGNOSIS — F4310 Post-traumatic stress disorder, unspecified: Secondary | ICD-10-CM | POA: Diagnosis not present

## 2021-11-24 DIAGNOSIS — F17200 Nicotine dependence, unspecified, uncomplicated: Secondary | ICD-10-CM

## 2021-11-24 DIAGNOSIS — Z9189 Other specified personal risk factors, not elsewhere classified: Secondary | ICD-10-CM

## 2021-11-24 MED ORDER — NORTRIPTYLINE HCL 50 MG PO CAPS: 50.0000 mg | ORAL_CAPSULE | Freq: Every day | ORAL | 0 refills | Status: DC

## 2021-11-24 MED ORDER — ARIPIPRAZOLE 20 MG PO TABS: 20.0000 mg | ORAL_TABLET | Freq: Every day | ORAL | 0 refills | Status: DC

## 2021-11-24 MED ORDER — BUPROPION HCL ER (XL) 300 MG PO TB24: 300.0000 mg | ORAL_TABLET | Freq: Every day | ORAL | 0 refills | Status: DC
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
## 2021-11-26 ENCOUNTER — Telehealth: Payer: Self-pay | Admitting: *Deleted

## 2021-11-26 NOTE — Telephone Encounter (Signed)
Patient Called & Stated She wanted you to know that she starts Therapy tomorrow @ [facility].

## 2021-11-26 NOTE — Telephone Encounter (Signed)
Noted thank you

## 2021-12-23 ENCOUNTER — Ambulatory Visit (INDEPENDENT_AMBULATORY_CARE_PROVIDER_SITE_OTHER): Payer: Medicare Other | Admitting: Psychiatry

## 2021-12-23 ENCOUNTER — Encounter: Payer: Self-pay | Admitting: Psychiatry

## 2021-12-23 VITALS — BP 117/76 | HR 91 | Temp 97.6°F | Wt 211.4 lb

## 2021-12-23 DIAGNOSIS — F250 Schizoaffective disorder, bipolar type: Secondary | ICD-10-CM | POA: Diagnosis not present

## 2021-12-23 DIAGNOSIS — F17200 Nicotine dependence, unspecified, uncomplicated: Secondary | ICD-10-CM

## 2021-12-23 DIAGNOSIS — F4310 Post-traumatic stress disorder, unspecified: Secondary | ICD-10-CM | POA: Diagnosis not present

## 2021-12-23 DIAGNOSIS — F5105 Insomnia due to other mental disorder: Secondary | ICD-10-CM | POA: Diagnosis not present

## 2021-12-23 DIAGNOSIS — F4010 Social phobia, unspecified: Secondary | ICD-10-CM | POA: Diagnosis not present

## 2021-12-23 NOTE — Progress Notes (Unsigned)
[facility] MD OP Progress Note  12/23/2021 9:52 AM Patricia Good  MRN:  [id number]  Chief Complaint:  Chief Complaint  Patient presents with   Follow-up: 61 year old female with history of schizoaffective disorder, social anxiety, PTSD presented for medication management.   HPI: Patricia Good is a 61 year old Caucasian female who has a history of schizoaffective disorder, social anxiety disorder, PTSD, COPD, vitamin B12 deficiency, psoriasis, history of malignant melanoma, hypothyroidism, osteoarthritis was evaluated in office today.  Patient today reports she is currently improving on the current medication regimen.  Does have chronic paranoia although she does not seem to be preoccupied with it.  Reports she is trying to stay away from her relatives, does have relationship struggles.  Reports she has started psychotherapy with [facility] in [place], currently follows up with her therapist-once a week.  Motivated to stay in therapy.  Patient denies any significant depressive symptoms at this time.  Reports she is able to manage her anxiety well.  Patient reports appetite is fair.  Has not lost any more weight since her last visit.  Reports she has started aerobics once a month, it is a good start.  Currently compliant on medications like Abilify, Wellbutrin, nortriptyline, prazosin and hydroxyzine as needed, denies any side effects.  Used to smoke cigarettes, has been trying to cut back however she is not trying to quit at this time.  Denies any suicidality or homicidality.  Denies any hallucinations.    Visit Diagnosis:    ICD-10-CM   1. Schizoaffective disorder, bipolar type ([place])  F25.0     2. PTSD (post-traumatic stress disorder)  F43.10     3. Social anxiety disorder  F40.10     4. Insomnia due to mental condition  F51.05    mood    5. Tobacco use disorder  F17.200       Past Psychiatric History: Reviewed past psychiatric history from progress note on 07/16/2017.  Past  Medical History:  Past Medical History:  Diagnosis Date   Abnormal thyroid stimulating hormone (TSH) level    ADHD (attention deficit hyperactivity disorder)    Anxiety    Asthma    Diabetes mellitus, type II (HCC)    PTSD (post-traumatic stress disorder)    Schizoaffective disorder ([place])     Past Surgical History:  Procedure Laterality Date   ABDOMINAL HYSTERECTOMY     ECTOPIC PREGNANCY SURGERY      Family Psychiatric History: Reviewed family psychiatric history from progress note on 07/16/2017.  Family History:  Family History  Problem Relation Age of Onset   Hypertension Father    Diabetes Father    Heart disease Father    Blindness Father    Obesity Brother    Arthritis Brother    Alcohol abuse Brother    Drug abuse Brother    Depression Brother    Alcohol abuse Sister    Drug abuse Sister    Anxiety disorder Sister    Depression Sister    Breast cancer Neg Hx     Social History: Reviewed social history from progress note on 07/16/2017. Social History   Socioeconomic History   Marital status: Single    Spouse name: Not on file   Number of children: 0   Years of education: Not on file   Highest education level: Associate degree: occupational, Hotel manager, or vocational program  Occupational History   Not on file  Tobacco Use   Smoking status: Some Days    Packs/day: 0.50  Types: Cigarettes    Start date: 01/22/1969   Smokeless tobacco: Never   Tobacco comments:    3-5 cigarettes per day reported 04/10/2021  Vaping Use   Vaping Use: Never used  Substance and Sexual Activity   Alcohol use: Yes    Alcohol/week: 1.0 - 2.0 standard drink of alcohol    Types: 1 Glasses of wine per week   Drug use: No   Sexual activity: Not Currently  Other Topics Concern   Not on file  Social History Narrative   Not on file   Social Determinants of Health   Financial Resource Strain: Medium Risk (06/15/2017)   Overall Financial Resource Strain (CARDIA)    Difficulty  of Paying Living Expenses: Somewhat hard  Food Insecurity: Food Insecurity Present (06/15/2017)   Hunger Vital Sign    Worried About Running Out of Food in the Last Year: Sometimes true    Ran Out of Food in the Last Year: Sometimes true  Transportation Needs: No Transportation Needs (06/15/2017)   PRAPARE - Hydrologist (Medical): No    Lack of Transportation (Non-Medical): No  Physical Activity: Inactive (06/15/2017)   Exercise Vital Sign    Days of Exercise per Week: 0 days    Minutes of Exercise per Session: 0 min  Stress: Stress Concern Present (06/15/2017)   [place]    Feeling of Stress : Very much  Social Connections: Socially Isolated (06/15/2017)   Social Connection and Isolation Panel [NHANES]    Frequency of Communication with Friends and Family: Never    Frequency of Social Gatherings with Friends and Family: Never    Attends Religious Services: Never    Marine scientist or Organizations: No    Attends Archivist Meetings: Never    Marital Status: Divorced    Allergies:  Allergies  Allergen Reactions   Shellfish Allergy Anaphylaxis    Only to crab meat (non-imitation), not shrimp   Sulfa Antibiotics Hives, Anaphylaxis and Other (See Comments)   Sulfasalazine Anaphylaxis and Hives    Other reaction(s): Other (See Comments)   Lisinopril     Metabolic Disorder Labs: Lab Results  Component Value Date   HGBA1C 5.7 (H) 05/06/2020   MPG 116.89 05/06/2020   MPG 117 12/21/2017   Lab Results  Component Value Date   PROLACTIN 1.3 (L) 05/06/2020   PROLACTIN 1.2 (L) 07/12/2017   Lab Results  Component Value Date   CHOL 193 05/06/2020   TRIG 103 05/06/2020   HDL 56 05/06/2020   CHOLHDL 3.4 05/06/2020   VLDL 21 05/06/2020   LDLCALC 116 (H) 05/06/2020   LDLCALC 163 (H) 12/21/2017   Lab Results  Component Value Date   TSH 2.250 06/17/2021   TSH 2.143  05/06/2020    Therapeutic Level Labs: No results found for: "LITHIUM" No results found for: "VALPROATE" No results found for: "CBMZ"  Current Medications: Current Outpatient Medications  Medication Sig Dispense Refill   albuterol (VENTOLIN HFA) 108 (90 Base) MCG/ACT inhaler Inhale into the lungs.     alendronate (FOSAMAX) 70 MG tablet Take 70 mg by mouth once a week.     amLODipine (NORVASC) 10 MG tablet Take 10 mg by mouth daily.     ARIPiprazole (ABILIFY) 20 MG tablet Take 1 tablet (20 mg total) by mouth daily. 90 tablet 0   atorvastatin (LIPITOR) 20 MG tablet Take 20 mg by mouth at bedtime.  B Complex-C (B-COMPLEX WITH VITAMIN C) tablet Take 1 tablet by mouth daily.     buPROPion (WELLBUTRIN XL) 300 MG 24 hr tablet Take 1 tablet (300 mg total) by mouth daily. 90 tablet 0   calcium-vitamin D (OSCAL WITH D) 500-200 MG-UNIT tablet Take 1 tablet by mouth daily with breakfast.     furosemide (LASIX) 20 MG tablet Take 20 mg by mouth daily.      hydrOXYzine (ATARAX) 10 MG tablet Take 2 tablets (20 mg total) by mouth at bedtime as needed. for sleep and anxiety 180 tablet 0   levothyroxine (SYNTHROID, LEVOTHROID) 112 MCG tablet      meloxicam (MOBIC) 15 MG tablet Take 1 tablet by mouth daily.     nortriptyline (PAMELOR) 50 MG capsule Take 1 capsule (50 mg total) by mouth at bedtime. 90 capsule 0   omeprazole (PRILOSEC) 40 MG capsule Take 40 mg by mouth daily.      prazosin (MINIPRESS) 2 MG capsule Take 1 capsule (2 mg total) by mouth at bedtime. 90 capsule 1   PREVIDENT 5000 DRY MOUTH 1.1 % GEL dental gel Place onto teeth.     SPIRIVA HANDIHALER 18 MCG inhalation capsule      valACYclovir (VALTREX) 1000 MG tablet      No current facility-administered medications for this visit.     Musculoskeletal: Strength & Muscle Tone: within normal limits Gait & Station: normal Patient leans: N/A  Psychiatric Specialty Exam: Review of Systems  Psychiatric/Behavioral:  The patient is  nervous/anxious.        Chronic paranoia  All other systems reviewed and are negative.   Blood pressure 117/76, pulse 91, temperature 97.6 F (36.4 C), temperature source Temporal, weight 211 lb 6.4 oz (95.9 kg).Body mass index is 39.94 kg/m.  General Appearance: Casual  Eye Contact:  Fair  Speech:  Clear and Coherent  Volume:  Normal  Mood:  Anxious  Affect:  Flat  Thought Process:  Goal Directed and Descriptions of Associations: Intact  Orientation:  Full (Time, Place, and Person)  Thought Content: Paranoid Ideation chronic-does not bother her, able to ignore  Suicidal Thoughts:  No  Homicidal Thoughts:  No  Memory:  Immediate;   Fair Recent;   Fair Remote;   Fair  Judgement:  Fair  Insight:  Fair  Psychomotor Activity:  Normal  Concentration:  Concentration: Fair and Attention Span: Fair  Recall:  [employer] of Knowledge: Fair  Language: Fair  Akathisia:  No  Handed:  Right  AIMS (if indicated): done  Assets:  Communication Skills Desire for Improvement Housing Social Support Transportation  ADL's:  Intact  Cognition: WNL  Sleep:  Fair   Screenings: [facility] Office Visit from 12/23/2021 in [facility] Office Visit from 11/24/2021 in [facility] Visit from 10/16/2021 in [facility] Office Visit from 09/24/2021 in [facility] Video Visit from 08/29/2021 in [facility] Total Score 0 0 0 0 0      AUDIT    Flowsheet Row Admission (Discharged) from 12/22/2017 in [facility]  Alcohol Use Disorder Identification Test Final Score (AUDIT) 1      GAD-7    Flowsheet Row Office Visit from 12/23/2021 in [facility] Office Visit from 11/24/2021 in [facility] Visit from 09/24/2021 in [facility]  Office Visit from 06/05/2021 in [facility]  Total GAD-7 Score '4 12 5 7      '$ PHQ2-9    Flowsheet Row Office Visit from 12/23/2021 in [facility] Office Visit from 11/24/2021 in [facility] Office Visit from 10/16/2021 in [facility] Office Visit from 09/24/2021 in [facility] Video Visit from 08/29/2021 in [facility]  PHQ-2 Total Score '3 3 3 5 4  '$ PHQ-9 Total Score '6 9 7 17 14      '$ [facility] Office Visit from 12/23/2021 in [facility] Office Visit from 11/24/2021 in [facility] Office Visit from 10/16/2021 in [facility] No Risk No Risk No Risk        Assessment and Plan: Patricia Good is a 61 year old Caucasian female, divorced, on SSD, currently lives in [place], has a history of schizoaffective disorder, social anxiety, PTSD, currently improving on the current medication regimen.  Plan as noted below.  Plan Schizoaffective disorder-improving Abilify 20 mg p.o. daily-reduced dosage. Wellbutrin XL 300 mg p.o. daily Nortriptyline 50 mg p.o. nightly  PTSD-improving Nortriptyline as prescribed Prazosin 2 mg p.o. nightly Hydroxyzine 10 to 20 mg p.o. nightly as needed. Patient is currently in psychotherapy-[facility] in [place]. Patient to continue CBT.  Social anxiety disorder-improving Continue CBT  Insomnia-stable Continue nortriptyline 50 mg p.o. nightly Continue prazosin as noted above. Continue sleep hygiene techniques.  Tobacco use disorder-improving Provided counseling for 1 minute.  Failed trials of medications like Chantix as well as NRT.   Follow-up in clinic in 1 month or sooner if needed.  This note was generated in part or whole with voice recognition software. Voice recognition  is usually quite accurate but there are transcription errors that can and very often do occur. I apologize for any typographical errors that were not detected and corrected.     Ursula Alert, MD 12/24/2021, 8:19 AM

## 2021-12-24 ENCOUNTER — Encounter: Payer: Self-pay | Admitting: Psychiatry

## 2021-12-26 ENCOUNTER — Telehealth: Payer: Self-pay

## 2021-12-26 NOTE — Telephone Encounter (Signed)
Yes , do have an ROI signed ?

## 2021-12-26 NOTE — Telephone Encounter (Signed)
pt wants a referral to them for therapy services.

## 2021-12-29 NOTE — Telephone Encounter (Signed)
Are you suppose to send a referral or does she just need to find someone on her own?

## 2021-12-29 NOTE — Telephone Encounter (Signed)
Please go ahead and send a referral , but please make sure we have a signed ROI .

## 2022-01-07 ENCOUNTER — Other Ambulatory Visit: Payer: Self-pay | Admitting: Internal Medicine

## 2022-01-07 DIAGNOSIS — Z1231 Encounter for screening mammogram for malignant neoplasm of breast: Secondary | ICD-10-CM

## 2022-01-07 NOTE — Telephone Encounter (Signed)
Referral sent 

## 2022-01-16 ENCOUNTER — Ambulatory Visit
Admission: RE | Admit: 2022-01-16 | Discharge: 2022-01-16 | Disposition: A | Discharge status: Home / Self Care | Payer: Medicare Other | Source: Ambulatory Visit | Attending: Internal Medicine | Admitting: Internal Medicine

## 2022-01-16 DIAGNOSIS — Z1231 Encounter for screening mammogram for malignant neoplasm of breast: Secondary | ICD-10-CM

## 2022-01-20 ENCOUNTER — Other Ambulatory Visit: Payer: Self-pay | Admitting: Internal Medicine

## 2022-01-20 DIAGNOSIS — R928 Other abnormal and inconclusive findings on diagnostic imaging of breast: Secondary | ICD-10-CM

## 2022-01-26 ENCOUNTER — Other Ambulatory Visit: Payer: Self-pay | Admitting: Internal Medicine

## 2022-01-26 DIAGNOSIS — R928 Other abnormal and inconclusive findings on diagnostic imaging of breast: Secondary | ICD-10-CM

## 2022-01-26 DIAGNOSIS — R921 Mammographic calcification found on diagnostic imaging of breast: Secondary | ICD-10-CM

## 2022-02-12 ENCOUNTER — Ambulatory Visit
Admission: RE | Admit: 2022-02-12 | Discharge: 2022-02-12 | Disposition: A | Discharge status: Home / Self Care | Payer: Medicare Other | Source: Ambulatory Visit | Attending: Internal Medicine | Admitting: Internal Medicine

## 2022-02-12 DIAGNOSIS — R928 Other abnormal and inconclusive findings on diagnostic imaging of breast: Secondary | ICD-10-CM | POA: Diagnosis present

## 2022-02-12 DIAGNOSIS — R921 Mammographic calcification found on diagnostic imaging of breast: Secondary | ICD-10-CM | POA: Diagnosis present

## 2022-02-13 ENCOUNTER — Ambulatory Visit: Payer: Medicare Other | Admitting: Psychiatry

## 2022-02-16 ENCOUNTER — Other Ambulatory Visit: Payer: Self-pay | Admitting: Internal Medicine

## 2022-02-16 DIAGNOSIS — R921 Mammographic calcification found on diagnostic imaging of breast: Secondary | ICD-10-CM

## 2022-02-16 DIAGNOSIS — R928 Other abnormal and inconclusive findings on diagnostic imaging of breast: Secondary | ICD-10-CM

## 2022-03-03 ENCOUNTER — Ambulatory Visit
Admission: RE | Admit: 2022-03-03 | Discharge: 2022-03-03 | Disposition: A | Discharge status: Home / Self Care | Payer: Medicare Other | Source: Ambulatory Visit | Attending: Internal Medicine | Admitting: Internal Medicine

## 2022-03-03 DIAGNOSIS — R928 Other abnormal and inconclusive findings on diagnostic imaging of breast: Secondary | ICD-10-CM

## 2022-03-03 DIAGNOSIS — R921 Mammographic calcification found on diagnostic imaging of breast: Secondary | ICD-10-CM

## 2022-03-03 HISTORY — PX: BREAST BIOPSY: SHX20

## 2022-03-03 MED ORDER — LIDOCAINE-EPINEPHRINE 1 %-1:100000 IJ SOLN
10.0000 mL | Freq: Once | INTRAMUSCULAR | Status: AC
  Administered 2022-03-03: 10 mL

## 2022-03-03 MED ORDER — LIDOCAINE HCL (PF) 1 % IJ SOLN
20.0000 mL | Freq: Once | INTRAMUSCULAR | Status: AC
  Administered 2022-03-03: 20 mL

## 2022-03-05 ENCOUNTER — Encounter: Payer: Self-pay | Admitting: *Deleted

## 2022-03-05 DIAGNOSIS — C50919 Malignant neoplasm of unspecified site of unspecified female breast: Secondary | ICD-10-CM

## 2022-03-05 LAB — SURGICAL PATHOLOGY

## 2022-03-05 NOTE — Progress Notes (Signed)
Received referral for newly diagnosed breast cancer from [facility].  Navigation initiated.  Med Onc and Surgical consults scheduled.

## 2022-03-06 ENCOUNTER — Telehealth: Payer: Self-pay | Admitting: Psychiatry

## 2022-03-06 DIAGNOSIS — C50212 Malignant neoplasm of upper-inner quadrant of left female breast: Secondary | ICD-10-CM

## 2022-03-06 DIAGNOSIS — Z170 Estrogen receptor positive status [ER+]: Secondary | ICD-10-CM

## 2022-03-06 HISTORY — DX: Malignant neoplasm of upper-inner quadrant of left female breast: C50.212

## 2022-03-06 HISTORY — DX: Estrogen receptor positive status (ER+): Z17.0

## 2022-03-06 NOTE — Telephone Encounter (Signed)
Patient found out yesterday she has breast cancer. Patient would like to speak with you.  Patients # [phone]

## 2022-03-06 NOTE — Telephone Encounter (Signed)
Return call to patient.  Patient reports she was diagnosed with breast carcinoma, currently awaiting to have an appointment with oncology for treatment plan.  She reports she is taking it okay and coping well.  She is waiting for therapy sessions with [facility], reports she was told there is no refills sent by [place].  However per review of medical records CMA Ms. Vernell Morgans did place a referral on 01/07/2022.  Patient advised to check with them and let writer know if they do not have it on file.  Patient does not need any medication changes at this time.  Advised to call back as needed.

## 2022-03-10 ENCOUNTER — Encounter: Payer: Self-pay | Admitting: Oncology

## 2022-03-10 ENCOUNTER — Inpatient Hospital Stay: Payer: Medicare Other

## 2022-03-10 ENCOUNTER — Inpatient Hospital Stay: Payer: Medicare Other | Attending: Oncology | Admitting: Oncology

## 2022-03-10 ENCOUNTER — Encounter: Payer: Self-pay | Admitting: *Deleted

## 2022-03-10 VITALS — BP 117/61 | HR 83 | Temp 97.0°F | Resp 18 | Wt 210.0 lb

## 2022-03-10 DIAGNOSIS — Z803 Family history of malignant neoplasm of breast: Secondary | ICD-10-CM | POA: Diagnosis not present

## 2022-03-10 DIAGNOSIS — F17210 Nicotine dependence, cigarettes, uncomplicated: Secondary | ICD-10-CM | POA: Diagnosis not present

## 2022-03-10 DIAGNOSIS — Z79899 Other long term (current) drug therapy: Secondary | ICD-10-CM | POA: Insufficient documentation

## 2022-03-10 DIAGNOSIS — Z170 Estrogen receptor positive status [ER+]: Secondary | ICD-10-CM

## 2022-03-10 DIAGNOSIS — F17200 Nicotine dependence, unspecified, uncomplicated: Secondary | ICD-10-CM | POA: Diagnosis not present

## 2022-03-10 DIAGNOSIS — Z7189 Other specified counseling: Secondary | ICD-10-CM | POA: Diagnosis not present

## 2022-03-10 DIAGNOSIS — C50212 Malignant neoplasm of upper-inner quadrant of left female breast: Secondary | ICD-10-CM

## 2022-03-10 NOTE — Progress Notes (Signed)
Accompanied patient and family to initial medical oncology appointment.   Reviewed Breast Cancer treatment handbook.   Care plan summary given to patient.   Reviewed outreach programs and cancer center services.   

## 2022-03-10 NOTE — Progress Notes (Signed)
Hematology/Oncology Consult note [facility] Telephone:([phone] [phone]  Patient Care Team: Waylan Rocher, MD as PCP - General (Internal Medicine) Daiva Huge, RN as Oncology Nurse Navigator   Name of the patient: Patricia Good  [id number]  27-Oct-1960    Reason for referral- new diagnosis of breast cancer   Referring physician- Dr. Oris Drone  Date of visit: 03/10/22   History of presenting illness- Patient is a 61 year old female who underwent a routine screening bilateral mammogram in October 2023 which showed possible calcifications in the left breast.  This was followed by a diagnostic mammogram which showed a 3 mm group of calcifications in the upper inner quadrant of the left breast and a biopsy was recommended.  Core biopsy showed invasive mammary carcinoma 2 mm grade 2 ER greater than 90% positive PR greater than 90% positive and HER2 negative.  Menarche at 70.  Menopause at 59.  No prior use of contraceptive pills.  Paternal cousin had breast cancer.  ECOG PS- 1  Pain scale- 0   Review of systems- Review of Systems  Constitutional:  Negative for chills, fever, malaise/fatigue and weight loss.  HENT:  Negative for congestion, ear discharge and nosebleeds.   Eyes:  Negative for blurred vision.  Respiratory:  Negative for cough, hemoptysis, sputum production, shortness of breath and wheezing.   Cardiovascular:  Negative for chest pain, palpitations, orthopnea and claudication.  Gastrointestinal:  Negative for abdominal pain, blood in stool, constipation, diarrhea, heartburn, melena, nausea and vomiting.  Genitourinary:  Negative for dysuria, flank pain, frequency, hematuria and urgency.  Musculoskeletal:  Negative for back pain, joint pain and myalgias.  Skin:  Negative for rash.  Neurological:  Negative for dizziness, tingling, focal weakness, seizures, weakness and headaches.  Endo/Heme/Allergies:  Does not bruise/bleed  easily.  Psychiatric/Behavioral:  Negative for depression and suicidal ideas. The patient does not have insomnia.     Allergies  Allergen Reactions   Shellfish Allergy Anaphylaxis    Only to crab meat (non-imitation), not shrimp   Sulfa Antibiotics Hives, Anaphylaxis and Other (See Comments)   Sulfasalazine Anaphylaxis and Hives    Other reaction(s): Other (See Comments)   Lisinopril     Patient Active Problem List   Diagnosis Date Noted   High risk medication use 04/29/2020   Schizoaffective disorder, in remission ([place]) 04/04/2019   Social anxiety disorder 09/19/2018   Insomnia due to mental condition 09/19/2018   Urinary tract infection 12/22/2017   Schizoaffective disorder, bipolar type ([place]) 12/22/2017   Bipolar affective disorder, depressed, severe, with psychotic behavior ([place]) 02/12/2017   PTSD (post-traumatic stress disorder) 09/23/2015   Arthritis 01/23/2015   H/O Malignant melanoma 06/15/2014   Bipolar I disorder ([place]) 09/06/2013   HTN (hypertension) 09/06/2013   Peripheral vascular disease ([place]) 09/06/2013   Tobacco use disorder 09/06/2013     Past Medical History:  Diagnosis Date   Abnormal thyroid stimulating hormone (TSH) level    ADHD (attention deficit hyperactivity disorder)    Anxiety    Asthma    Diabetes mellitus, type II ([place])    PTSD (post-traumatic stress disorder)    Schizoaffective disorder ([place])      Past Surgical History:  Procedure Laterality Date   ABDOMINAL HYSTERECTOMY     BREAST BIOPSY Left 03/03/2022   Left breast stereo bx Ribbon Clip- path pending   BREAST BIOPSY Left 03/03/2022   MM LT BREAST BX W LOC DEV 1ST LESION IMAGE BX SPEC STEREO GUIDE 03/03/2022 [facility]  ECTOPIC PREGNANCY SURGERY      Social History   Socioeconomic History   Marital status: Single    Spouse name: Not on file   Number of children: 0   Years of education: Not on file   Highest education level: Associate degree: occupational, Hotel manager,  or vocational program  Occupational History   Not on file  Tobacco Use   Smoking status: Some Days    Packs/day: 0.50    Types: Cigarettes    Start date: 01/22/1969   Smokeless tobacco: Never   Tobacco comments:    7-10 cigarettes a day reported on 03/10/2022.   Vaping Use   Vaping Use: Never used  Substance and Sexual Activity   Alcohol use: Yes    Alcohol/week: 1.0 - 2.0 standard drink of alcohol    Types: 1 Glasses of wine per week    Comment: occasional   Drug use: No   Sexual activity: Not Currently  Other Topics Concern   Not on file  Social History Narrative   Not on file   Social Determinants of Health   Financial Resource Strain: Medium Risk (06/15/2017)   Overall Financial Resource Strain (CARDIA)    Difficulty of Paying Living Expenses: Somewhat hard  Food Insecurity: Food Insecurity Present (06/15/2017)   Hunger Vital Sign    Worried About Running Out of Food in the Last Year: Sometimes true    Ran Out of Food in the Last Year: Sometimes true  Transportation Needs: No Transportation Needs (06/15/2017)   PRAPARE - Hydrologist (Medical): No    Lack of Transportation (Non-Medical): No  Physical Activity: Inactive (06/15/2017)   Exercise Vital Sign    Days of Exercise per Week: 0 days    Minutes of Exercise per Session: 0 min  Stress: Stress Concern Present (06/15/2017)   [place]    Feeling of Stress : Very much  Social Connections: Socially Isolated (06/15/2017)   Social Connection and Isolation Panel [NHANES]    Frequency of Communication with Friends and Family: Never    Frequency of Social Gatherings with Friends and Family: Never    Attends Religious Services: Never    Marine scientist or Organizations: No    Attends Archivist Meetings: Never    Marital Status: Divorced  Human resources officer Violence: Not At Risk (06/15/2017)   Humiliation, Afraid,  Rape, and Kick questionnaire    Fear of Current or Ex-Partner: No    Emotionally Abused: No    Physically Abused: No    Sexually Abused: No     Family History  Problem Relation Age of Onset   Hypertension Father    Diabetes Father    Heart disease Father    Blindness Father    Alcohol abuse Sister    Drug abuse Sister    Anxiety disorder Sister    Depression Sister    Obesity Brother    Arthritis Brother    Alcohol abuse Brother    Drug abuse Brother    Depression Brother    Breast cancer Cousin    Clotting disorder Neg Hx      Current Outpatient Medications:    albuterol (VENTOLIN HFA) 108 (90 Base) MCG/ACT inhaler, Inhale into the lungs., Disp: , Rfl:    alendronate (FOSAMAX) 70 MG tablet, Take 70 mg by mouth once a week., Disp: , Rfl:    amLODipine (NORVASC) 10 MG tablet, Take 10 mg  by mouth daily., Disp: , Rfl:    ARIPiprazole (ABILIFY) 20 MG tablet, Take 1 tablet (20 mg total) by mouth daily., Disp: 90 tablet, Rfl: 0   atorvastatin (LIPITOR) 20 MG tablet, Take 20 mg by mouth at bedtime., Disp: , Rfl:    B Complex-C (B-COMPLEX WITH VITAMIN C) tablet, Take 1 tablet by mouth daily., Disp: , Rfl:    buPROPion (WELLBUTRIN XL) 300 MG 24 hr tablet, Take 1 tablet (300 mg total) by mouth daily., Disp: 90 tablet, Rfl: 0   calcium-vitamin D (OSCAL WITH D) 500-200 MG-UNIT tablet, Take 1 tablet by mouth daily with breakfast., Disp: , Rfl:    furosemide (LASIX) 20 MG tablet, Take 20 mg by mouth daily. , Disp: , Rfl:    hydrOXYzine (ATARAX) 10 MG tablet, Take 2 tablets (20 mg total) by mouth at bedtime as needed. for sleep and anxiety, Disp: 180 tablet, Rfl: 0   levothyroxine (SYNTHROID, LEVOTHROID) 112 MCG tablet, , Disp: , Rfl:    meloxicam (MOBIC) 15 MG tablet, Take 1 tablet by mouth daily., Disp: , Rfl:    nortriptyline (PAMELOR) 50 MG capsule, Take 1 capsule (50 mg total) by mouth at bedtime., Disp: 90 capsule, Rfl: 0   omeprazole (PRILOSEC) 40 MG capsule, Take 40 mg by mouth  daily. , Disp: , Rfl:    prazosin (MINIPRESS) 2 MG capsule, Take 1 capsule (2 mg total) by mouth at bedtime., Disp: 90 capsule, Rfl: 1   PREVIDENT 5000 DRY MOUTH 1.1 % GEL dental gel, Place onto teeth., Disp: , Rfl:    SPIRIVA HANDIHALER 18 MCG inhalation capsule, , Disp: , Rfl:    valACYclovir (VALTREX) 1000 MG tablet, , Disp: , Rfl:    fluticasone (FLONASE) 50 MCG/ACT nasal spray, Place into both nostrils. (Patient not taking: Reported on 03/10/2022), Disp: , Rfl:    Physical exam:  Vitals:   03/10/22 1310  BP: 117/61  Pulse: 83  Resp: 18  Temp: (!) 97 F (36.1 C)  SpO2: 97%  Weight: 210 lb (95.3 kg)   Physical Exam Cardiovascular:     Rate and Rhythm: Normal rate and regular rhythm.     Heart sounds: Normal heart sounds.  Pulmonary:     Effort: Pulmonary effort is normal.     Breath sounds: Normal breath sounds.  Abdominal:     General: Bowel sounds are normal.     Palpations: Abdomen is soft.  Skin:    General: Skin is warm and dry.  Neurological:     Mental Status: She is alert and oriented to person, place, and time.    Breast: Bruising noted at the site of recent breast biopsy. No palpable masses in either breast. No palpable b/l axillary adenopathy      Latest Ref Rng & Units 06/12/2019   11:41 PM  CMP  Glucose 70 - 99 mg/dL 102   BUN 8 - 23 mg/dL 14   Creatinine 0.44 - 1.00 mg/dL 1.31   Sodium 135 - 145 mmol/L 136   Potassium 3.5 - 5.1 mmol/L 3.3   Chloride 98 - 111 mmol/L 99   CO2 22 - 32 mmol/L 26   Calcium 8.9 - 10.3 mg/dL 9.0   Total Protein 6.5 - 8.1 g/dL 7.9   Total Bilirubin 0.3 - 1.2 mg/dL 0.6   Alkaline Phos 38 - 126 U/L 149   AST 15 - 41 U/L 32   ALT 0 - 44 U/L 41       Latest Ref Rng &  Units 06/12/2019   11:41 PM  CBC  WBC 4.0 - 10.5 K/uL 11.1   Hemoglobin 12.0 - 15.0 g/dL 13.1   Hematocrit 36.0 - 46.0 % 37.7   Platelets 150 - 400 K/uL 247     No images are attached to the encounter.  MM LT BREAST BX W LOC DEV 1ST LESION IMAGE BX SPEC  STEREO GUIDE  Addendum Date: 03/05/2022   ADDENDUM REPORT: 03/05/2022 15:29 ADDENDUM: PATHOLOGY revealed: A. BREAST, LEFT UPPER INNER QUADRANT; STEREOTACTIC CORE NEEDLE BIOPSY: - INVASIVE MAMMARY CARCINOMA, NO SPECIAL TYPE. 2 mm in this sample. Grade 2. 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 Lymphovascular invasion: Not identified. Pathology results are CONCORDANT with imaging findings, per Dr. Lajean Manes. Pathology results and recommendations were discussed with patient via telephone on 03/04/2022. Patient reported biopsy site doing well with no adverse symptoms, and only slight tenderness at the site. Post biopsy care instructions were reviewed, questions were answered and my direct phone number was provided. Patient was instructed to call [facility] for any additional questions or concerns related to biopsy site. RECOMMENDATIONS: Surgical and oncological consultation. Request for surgical and oncological consultation relayed to Casper Harrison RN at [facility] by Electa Sniff RN on 03/05/2022. Pathology results reported by Electa Sniff RN on 03/05/2022. Electronically Signed   By: Lajean Manes M.D.   On: 03/05/2022 15:29   Result Date: 03/05/2022 CLINICAL DATA:  Patient presents for stereotactic core needle biopsy left breast calcifications. EXAM: LEFT BREAST STEREOTACTIC CORE NEEDLE BIOPSY COMPARISON:  Previous exam(s). FINDINGS: The patient and I discussed the procedure of stereotactic-guided biopsy including benefits and alternatives. We discussed the high likelihood of a successful procedure. We discussed the risks of the procedure including infection, bleeding, tissue injury, clip migration, and inadequate sampling. Informed written consent was given. The usual time out protocol was performed immediately prior to the procedure. Using sterile technique and 1% Lidocaine as local anesthetic, under stereotactic guidance, a 9 gauge vacuum  assisted device was used to perform core needle biopsy of calcifications in the upper inner quadrant of the left breast using a superior approach. Specimen radiograph was performed showing calcifications for which biopsy was performed. Specimens with calcifications are identified for pathology. Lesion quadrant: Upper inner quadrant At the conclusion of the procedure, a ribbon shaped tissue marker clip was deployed into the biopsy cavity. Follow-up 2-view mammogram was performed and dictated separately. IMPRESSION: Stereotactic-guided biopsy of left breast calcifications. No apparent complications. Electronically Signed: By: Lajean Manes M.D. On: 03/03/2022 11:09   MM CLIP PLACEMENT LEFT  Result Date: 03/03/2022 CLINICAL DATA:  Assess post biopsy marker clip placement following stereotactic core needle biopsy of a small group of left breast calcifications. EXAM: 3D DIAGNOSTIC LEFT MAMMOGRAM POST STEREOTACTIC BIOPSY COMPARISON:  Previous exam(s). FINDINGS: 3D Mammographic images were obtained following stereotactic guided biopsy of left breast calcifications. The biopsy marking clip is in expected position at the site of biopsy. IMPRESSION: Appropriate positioning of the ribbon shaped biopsy marking clip at the site of biopsy in the upper slightly inner aspect of the left breast in the expected location of the 3 mm group of calcifications. Final Assessment: Post Procedure Mammograms for Marker Placement Electronically Signed   By: Lajean Manes M.D.   On: 03/03/2022 11:22  MM DIAG BREAST TOMO UNI LEFT  Result Date: 02/12/2022 CLINICAL DATA:  Callback for calcifications. EXAM: DIGITAL DIAGNOSTIC UNILATERAL LEFT MAMMOGRAM WITH TOMOSYNTHESIS TECHNIQUE: Left digital diagnostic mammography and breast tomosynthesis  was performed. COMPARISON:  Previous exam(s). ACR Breast Density Category b: There are scattered areas of fibroglandular density. FINDINGS: Spot magnification views of the LEFT breast demonstrate a 3 mm  group of pleomorphic calcifications in the LEFT upper slightly inner breast at anterior to middle depth. These are best seen on CC imaging. These are not definitively stable in comparison to remote prior mammogram from 2017. No additional suspicious findings are noted. IMPRESSION: A 3 mm group of calcifications in the LEFT upper slightly inner breast at anterior to middle depth is indeterminate. Recommend stereotactic guided biopsy for definitive characterization. RECOMMENDATION: LEFT breast stereotactic guided biopsy x1 I have discussed the findings and recommendations with the patient. The biopsy procedure was discussed with the patient and questions were answered. Patient expressed their understanding of the biopsy recommendation. Patient will be scheduled for biopsy at her earliest convenience by the schedulers. Ordering provider will be notified. If applicable, a reminder letter will be sent to the patient regarding the next appointment. BI-RADS CATEGORY  4: Suspicious. Electronically Signed   By: Valentino Saxon M.D.   On: 02/12/2022 09:12   Assessment and plan- Patient is a 61 y.o. female with newly diagnosed clinical prognostic stage Ia invasive mammary carcinoma of the left breast cT1a N0 M0 ER/PR positive greater than 90% and HER2 negative here to discuss further management  Discussed the results of mammogram with the patient in detail which showed a 3 mm group of calcifications in the upper inner quadrant of the left breast.  This was biopsied and was found to be a grade 2 invasive mammary carcinoma that was strongly ER/PR positive and HER2 negative.  Given that this is a small overall tumor I would recommend upfront lumpectomy and sentinel lymph node biopsy.  I will see her after final pathology results are back.  If final pathology shows grade 2 tumor that is greater than or equal to 5 mm I will consider sending Oncotype testing to determine if she would benefit from adjuvant chemotherapy or not.   Discussed what Oncotype testing is and how the results are interpreted.  There would also be role for adjuvant radiation therapy upon completion of lumpectomy.  Also given that her tumor is ER/PR positive there would be role for endocrine therapy upon completion of radiation.  Discussed briefly risks and benefits of endocrine therapy including all but not limited to fatigue, arthralgias, hot flashes and potential worsening bone health.  Will obtain a baseline bone density scan at this time.  Treatment will be given with a curative intent.  I will tentatively see her back in about 4 weeks time to discuss final pathology results along with radiation oncology.  Refer to genetic counseling.   Cancer Staging  Malignant neoplasm of upper-inner quadrant of left breast in female, estrogen receptor positive ([place]) Staging form: Breast, AJCC 8th Edition - Clinical stage from 03/10/2022: Stage IA (cT1a, cN0, cM0, G2, ER+, PR+, HER2-) - Signed by Sindy Guadeloupe, MD on 03/10/2022 Histologic grading system: 3 grade system     Thank you for this kind referral and the opportunity to participate in the care of this patient   Visit Diagnosis 1. Malignant neoplasm of upper-inner quadrant of left breast in female, estrogen receptor positive ([place])   2. Goals of care, counseling/discussion     Dr. Randa Evens, MD, MPH [facility] [phone] 03/10/2022

## 2022-03-11 ENCOUNTER — Other Ambulatory Visit: Payer: Self-pay | Admitting: General Surgery

## 2022-03-11 ENCOUNTER — Ambulatory Visit: Payer: Self-pay | Admitting: General Surgery

## 2022-03-11 DIAGNOSIS — Z170 Estrogen receptor positive status [ER+]: Secondary | ICD-10-CM

## 2022-03-11 NOTE — H&P (View-Only) (Signed)
PATIENT PROFILE: Patricia Good is a 61 y.o. female who presents to the Clinic for consultation at the request of Dr. Oris Drone for evaluation of left breast cancer.  PCP:  Jackalyn Lombard  HISTORY OF PRESENT ILLNESS: Patricia Good reports she had her screening mammogram on 02/12/2022.  She was found with small area of calcification.  This led to diagnostic mammogram.  Diagnostic mammogram and confirm 3 mm area of calcification in the upper inner quadrant.  Stereotactic core needle biopsy done on 02/23/2022 showed invasive mammary carcinoma, no special type.  This is ER positive, PR positive, HER2 nu.  Family history of breast cancer: Cousin from father side Family history of other cancers: None Menarche: 41 or 10 Menopause: 26 Used OCP: No Used estrogen and progesterone therapy: Yes History of Radiation to the chest: No Number of pregnancies: 1 ectopic pregnancy  Age of first pregnancy: Ectopic pregnancy at age 39  PROBLEM LIST: Problem List  Date Reviewed: 06/21/2017  None   GENERAL REVIEW OF SYSTEMS:   General ROS: negative for - chills, fatigue, fever, weight gain or weight loss Allergy and Immunology ROS: negative for - hives  Hematological and Lymphatic ROS: negative for - bleeding problems or bruising, negative for palpable nodes Endocrine ROS: negative for - heat or cold intolerance, hair changes Respiratory ROS: negative for - cough, shortness of breath or wheezing Cardiovascular ROS: no chest pain or palpitations GI ROS: negative for nausea, vomiting, abdominal pain, diarrhea, constipation Musculoskeletal ROS: negative for - joint swelling or muscle pain Neurological ROS: negative for - confusion, syncope Dermatological ROS: negative for pruritus and rash Psychiatric: negative for anxiety, depression, difficulty sleeping and memory loss  MEDICATIONS: Current Outpatient Medications  Medication Sig Dispense Refill   FLUoxetine (PROZAC) 40 MG capsule Take 40 mg by  mouth once daily.     levothyroxine (SYNTHROID) 112 MCG tablet Take 112 mcg by mouth once daily     omeprazole (PRILOSEC) 40 MG DR capsule Take 40 mg by mouth once daily.     VALACYCLOVIR HCL (VALTREX ORAL)      PROPRANOLOL HCL (INDERAL LA ORAL)  (Patient not taking: Reported on 03/11/2022)     No current facility-administered medications for this visit.    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   Asthma without status asthmaticus    Breast cancer (CMS-HCC) 03/2022   Left   CKD (chronic kidney disease)    Depression    History of abnormal cervical Pap smear    History of anemia    History of drug abuse (CMS-HCC)    History of ectopic pregnancy    History of endometriosis    History of hepatitis A    History of herpes genitalis    History of migraine    Hypothyroidism    Infection of wound hematoma    vaginal, status post drainage   Melanoma (CMS-HCC)    left cheek, Clark's Level III, Breslow's measurement 0.65 millimeters; status post shave biopsy and excision   Osteoarthritis    Psoriasis    Traumatic vaginal laceration    status post repair    PAST SURGICAL HISTORY: Past Surgical History:  Procedure Laterality Date   Shave biopsy   01/11/2007   melanoma, left cheek   Repair of traumatic vaginal laceration  10/15/2011   I&D of vaginal cuff  hematoma  11/03/2013   ABDOMINAL HYSTERECTOMY     Bilateral salpingectomy     for ectopic pregnancy   Excision of melanoma  03/10/2007 by Dr. Rochel Brome   left cheek   Left foot surgery       FAMILY HISTORY: Family History  Problem Relation Age of Onset   No Known Problems Mother    Diabetes Father    Stroke Father    Myocardial Infarction (Heart attack) Father        MI in his 82s   Breast cancer Neg Hx    Colon  cancer Neg Hx    High blood pressure (Hypertension) Neg Hx      SOCIAL HISTORY: Social History   Socioeconomic History   Marital status: Divorced  Tobacco Use   Smoking status: Every Day   Smokeless tobacco: Never  Vaping Use   Vaping Use: Never used  Substance and Sexual Activity   Alcohol use: Yes    Comment: Rare   Drug use: No    PHYSICAL EXAM: Vitals:   03/11/22 0753  BP: 124/78  Pulse: 93   There is no height or weight on file to calculate BMI.     GENERAL: Alert, active, oriented x3  HEENT: Pupils equal reactive to light. Extraocular movements are intact. Sclera clear. Palpebral conjunctiva normal red color.Pharynx clear.  NECK: Supple with no palpable mass and no adenopathy.  LUNGS: Sound clear with no rales rhonchi or wheezes.  HEART: Regular rhythm S1 and S2 without murmur.  BREAST: breasts appear normal, no suspicious masses, no skin or nipple changes or axillary nodes.  ABDOMEN: Soft and depressible, nontender with no palpable mass, no hepatomegaly.  EXTREMITIES: Well-developed well-nourished symmetrical with no dependent edema.  NEUROLOGICAL: Awake alert oriented, facial expression symmetrical, moving all extremities.  REVIEW OF DATA: I have reviewed the following data today: No visits with results within 3 Month(s) from this visit.  Latest known visit with results is:  No results found for any previous visit.  Impression evaluated the biopsy results from the core biopsy of the left breast calcifications.  There was found with invasive mammary carcinoma ER positive, PR positive, HER2 negative.  ASSESSMENT: Patricia Good is a 61 y.o. female presenting for consultation for malignant neoplasm of the left breast.    Patient was oriented again about the pathology results. Surgical alternatives were discussed with patient including partial vs total mastectomy. Surgical technique and post operative care was discussed with patient. Risk of surgery was  discussed with patient including but not limited to: wound infection, seroma, hematoma, brachial plexopathy, mondor's disease (thrombosis of small veins of breast), chronic wound pain, breast lymphedema, altered sensation to the nipple and cosmesis among others.   Malignant neoplasm of upper-inner quadrant of left breast in female, estrogen receptor positive  [C50.212, Z17.0]  PLAN: 1.  Left breast radiofrequency tag partial mastectomy and sentinel lymph node biopsy ([id number], [id number]) 2.  CBC, CMP 3.  Avoid taking aspirin 5 days before the procedure 4.  Contact us if you have any concerns  Patient verbalized understanding, all questions were answered, and were agreeable with the plan outlined above.     Herbert Pun, MD  Electronically signed by Herbert Pun, MD

## 2022-03-11 NOTE — H&P (Signed)
PATIENT PROFILE: Patricia Good is a 61 y.o. female who presents to the Clinic for consultation at the request of Dr. Entzminger for evaluation of left breast cancer.  PCP:  Entzminger, Kakeshia  HISTORY OF PRESENT ILLNESS: Patricia Good reports she had her screening mammogram on 02/12/2022.  She was found with small area of calcification.  This led to diagnostic mammogram.  Diagnostic mammogram and confirm 3 mm area of calcification in the upper inner quadrant.  Stereotactic core needle biopsy done on 02/23/2022 showed invasive mammary carcinoma, no special type.  This is ER positive, PR positive, HER2 nu.  Family history of breast cancer: Cousin from father side Family history of other cancers: None Menarche: 9 or 10 Menopause: 28 Used OCP: No Used estrogen and progesterone therapy: Yes History of Radiation to the chest: No Number of pregnancies: 1 ectopic pregnancy  Age of first pregnancy: Ectopic pregnancy at age 24  PROBLEM LIST: Problem List  Date Reviewed: 06/21/2017  None   GENERAL REVIEW OF SYSTEMS:   General ROS: negative for - chills, fatigue, fever, weight gain or weight loss Allergy and Immunology ROS: negative for - hives  Hematological and Lymphatic ROS: negative for - bleeding problems or bruising, negative for palpable nodes Endocrine ROS: negative for - heat or cold intolerance, hair changes Respiratory ROS: negative for - cough, shortness of breath or wheezing Cardiovascular ROS: no chest pain or palpitations GI ROS: negative for nausea, vomiting, abdominal pain, diarrhea, constipation Musculoskeletal ROS: negative for - joint swelling or muscle pain Neurological ROS: negative for - confusion, syncope Dermatological ROS: negative for pruritus and rash Psychiatric: negative for anxiety, depression, difficulty sleeping and memory loss  MEDICATIONS: Current Outpatient Medications  Medication Sig Dispense Refill   FLUoxetine (PROZAC) 40 MG capsule Take 40 mg by  mouth once daily.     levothyroxine (SYNTHROID) 112 MCG tablet Take 112 mcg by mouth once daily     omeprazole (PRILOSEC) 40 MG DR capsule Take 40 mg by mouth once daily.     VALACYCLOVIR HCL (VALTREX ORAL)      PROPRANOLOL HCL (INDERAL LA ORAL)  (Patient not taking: Reported on 03/11/2022)     No current facility-administered medications for this visit.    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   Asthma without status asthmaticus    Breast cancer (CMS-HCC) 03/2022   Left   CKD (chronic kidney disease)    Depression    History of abnormal cervical Pap smear    History of anemia    History of drug abuse (CMS-HCC)    History of ectopic pregnancy    History of endometriosis    History of hepatitis A    History of herpes genitalis    History of migraine    Hypothyroidism    Infection of wound hematoma    vaginal, status post drainage   Melanoma (CMS-HCC)    left cheek, Clark's Level III, Breslow's measurement 0.65 millimeters; status post shave biopsy and excision   Osteoarthritis    Psoriasis    Traumatic vaginal laceration    status post repair    PAST SURGICAL HISTORY: Past Surgical History:  Procedure Laterality Date   Shave biopsy   01/11/2007   melanoma, left cheek   Repair of traumatic vaginal laceration  10/15/2011   I&D of vaginal cuff   hematoma  11/03/2013   ABDOMINAL HYSTERECTOMY     Bilateral salpingectomy     for ectopic pregnancy   Excision of melanoma  03/10/2007 by Dr. Wilton Duchene   left cheek   Left foot surgery       FAMILY HISTORY: Family History  Problem Relation Age of Onset   No Known Problems Mother    Diabetes Father    Stroke Father    Myocardial Infarction (Heart attack) Father        MI in his 50s   Breast cancer Neg Hx    Colon  cancer Neg Hx    High blood pressure (Hypertension) Neg Hx      SOCIAL HISTORY: Social History   Socioeconomic History   Marital status: Divorced  Tobacco Use   Smoking status: Every Day   Smokeless tobacco: Never  Vaping Use   Vaping Use: Never used  Substance and Sexual Activity   Alcohol use: Yes    Comment: Rare   Drug use: No    PHYSICAL EXAM: Vitals:   03/11/22 0753  BP: 124/78  Pulse: 93   There is no height or weight on file to calculate BMI.     GENERAL: Alert, active, oriented x3  HEENT: Pupils equal reactive to light. Extraocular movements are intact. Sclera clear. Palpebral conjunctiva normal red color.Pharynx clear.  NECK: Supple with no palpable mass and no adenopathy.  LUNGS: Sound clear with no rales rhonchi or wheezes.  HEART: Regular rhythm S1 and S2 without murmur.  BREAST: breasts appear normal, no suspicious masses, no skin or nipple changes or axillary nodes.  ABDOMEN: Soft and depressible, nontender with no palpable mass, no hepatomegaly.  EXTREMITIES: Well-developed well-nourished symmetrical with no dependent edema.  NEUROLOGICAL: Awake alert oriented, facial expression symmetrical, moving all extremities.  REVIEW OF DATA: I have reviewed the following data today: No visits with results within 3 Month(s) from this visit.  Latest known visit with results is:  No results found for any previous visit.  Impression evaluated the biopsy results from the core biopsy of the left breast calcifications.  There was found with invasive mammary carcinoma ER positive, PR positive, HER2 negative.  ASSESSMENT: Patricia Good is a 61 y.o. female presenting for consultation for malignant neoplasm of the left breast.    Patient was oriented again about the pathology results. Surgical alternatives were discussed with patient including partial vs total mastectomy. Surgical technique and post operative care was discussed with patient. Risk of surgery was  discussed with patient including but not limited to: wound infection, seroma, hematoma, brachial plexopathy, mondor's disease (thrombosis of small veins of breast), chronic wound pain, breast lymphedema, altered sensation to the nipple and cosmesis among others.   Malignant neoplasm of upper-inner quadrant of left breast in female, estrogen receptor positive  [C50.212, Z17.0]  PLAN: 1.  Left breast radiofrequency tag partial mastectomy and sentinel lymph node biopsy ([id number], [id number]) 2.  CBC, CMP 3.  Avoid taking aspirin 5 days before the procedure 4.  Contact us if you have any concerns  Patient verbalized understanding, all questions were answered, and were agreeable with the plan outlined above.     Sarya Linenberger Cintron-Diaz, MD  Electronically signed by Annaly Skop Cintron-Diaz, MD 

## 2022-03-16 ENCOUNTER — Ambulatory Visit
Admission: RE | Admit: 2022-03-16 | Discharge: 2022-03-16 | Disposition: A | Discharge status: Home / Self Care | Payer: Medicare Other | Source: Ambulatory Visit | Attending: General Surgery | Admitting: General Surgery

## 2022-03-16 ENCOUNTER — Encounter
Admission: RE | Admit: 2022-03-16 | Discharge: 2022-03-16 | Disposition: A | Discharge status: Home / Self Care | Payer: Medicare Other | Source: Ambulatory Visit | Attending: General Surgery | Admitting: General Surgery

## 2022-03-16 DIAGNOSIS — C50212 Malignant neoplasm of upper-inner quadrant of left female breast: Secondary | ICD-10-CM | POA: Insufficient documentation

## 2022-03-16 DIAGNOSIS — Z170 Estrogen receptor positive status [ER+]: Secondary | ICD-10-CM | POA: Insufficient documentation

## 2022-03-16 HISTORY — DX: Gastro-esophageal reflux disease without esophagitis: K21.9

## 2022-03-16 HISTORY — DX: Essential (primary) hypertension: I10

## 2022-03-16 HISTORY — DX: Endometriosis, unspecified: N80.9

## 2022-03-16 HISTORY — DX: Chronic obstructive pulmonary disease, unspecified: J44.9

## 2022-03-16 HISTORY — DX: Anemia, unspecified: D64.9

## 2022-03-16 HISTORY — DX: Prediabetes: R73.03

## 2022-03-16 HISTORY — DX: Hypothyroidism, unspecified: E03.9

## 2022-03-16 HISTORY — DX: Bipolar disorder, unspecified: F31.9

## 2022-03-16 HISTORY — DX: Hepatitis a without hepatic coma: B15.9

## 2022-03-16 MED ORDER — LIDOCAINE HCL (PF) 1 % IJ SOLN
10.0000 mL | Freq: Once | INTRAMUSCULAR | Status: AC
  Administered 2022-03-16: 10 mL
  Filled 2022-03-16: qty 10

## 2022-03-16 NOTE — Patient Instructions (Addendum)
Your procedure is scheduled on: Friday, December 15 Report to the Registration Desk on the 1st floor of the [facility]. To find out your arrival time, please call [phone] between 1PM - 3PM on: Thursday, December 14 If your arrival time is 6:00 am, do not arrive prior to that time as the [facility] entrance doors do not open until 6:00 am.  REMEMBER: Instructions that are not followed completely may result in serious medical risk, up to and including death; or upon the discretion of your surgeon and anesthesiologist your surgery may need to be rescheduled.  Do not eat or drink after midnight the night before surgery.  No gum chewing, lozengers or hard candies.  TAKE THESE MEDICATIONS THE MORNING OF SURGERY WITH A SIP OF WATER:  Amlodipine Aripiprazole (Abilify) Burpoprion (Wellbutrin) Levothyroxine Omeprazole (Prilosec) - (take one the night before and one on the morning of surgery - helps to prevent nausea after surgery.) Spiriva inhaler  Use inhalers on the day of surgery and bring albuterol inhaler to the hospital and use prior to surgery.  One week prior to surgery: Stop meloxicam and Anti-inflammatories (NSAIDS) such as Advil, Aleve, Ibuprofen, Motrin, Naproxen, Naprosyn and Aspirin based products such as Excedrin, Goodys Powder, BC Powder. Stop ANY OVER THE COUNTER supplements until after surgery. You may however, continue to take Tylenol if needed for pain up until the day of surgery.  No Alcohol for 24 hours before or after surgery.  No Smoking including e-cigarettes for 24 hours prior to surgery.  No chewable tobacco products for at least 6 hours prior to surgery.  No nicotine patches on the day of surgery.  Do not use any "recreational" drugs for at least a week prior to your surgery.  Please be advised that the combination of cocaine and anesthesia may have negative outcomes, up to and including death. If you test positive for cocaine, your surgery will be  cancelled.  On the morning of surgery brush your teeth with toothpaste and water, you may rinse your mouth with mouthwash if you wish. Do not swallow any toothpaste or mouthwash.  Use CHG Soap as directed on instruction sheet.  Do not wear jewelry, make-up, hairpins, clips or nail polish.  Do not wear lotions, powders, or perfumes.   Do not shave body from the neck down 48 hours prior to surgery just in case you cut yourself which could leave a site for infection.  Also, freshly shaved skin may become irritated if using the CHG soap.  Contact lenses, hearing aids and dentures may not be worn into surgery.  Do not bring valuables to the hospital. [facility] is not responsible for any missing/lost belongings or valuables.   Notify your doctor if there is any change in your medical condition (cold, fever, infection).  Wear comfortable clothing (specific to your surgery type) to the hospital.  After surgery, you can help prevent lung complications by doing breathing exercises.  Take deep breaths and cough every 1-2 hours. Your doctor may order a device called an Incentive Spirometer to help you take deep breaths.  If you are being discharged the day of surgery, you will not be allowed to drive home. You will need a responsible adult (18 years or older) to drive you home and stay with you that night.   If you are taking public transportation, you will need to have a responsible adult (18 years or older) with you. Please confirm with your physician that it is acceptable to use  public transportation.   Please call the [facility]. at [phone] if you have any questions about these instructions.  Surgery Visitation Policy:  Patients undergoing a surgery or procedure may have two family members or support persons with them as long as the person is not COVID-19 positive or experiencing its symptoms.      Preparing for Surgery with CHLORHEXIDINE GLUCONATE (CHG)  Soap  Chlorhexidine Gluconate (CHG) Soap  o An antiseptic cleaner that kills germs and bonds with the skin to continue killing germs even after washing  o Used for showering the night before surgery and morning of surgery  Before surgery, you can play an important role by reducing the number of germs on your skin.  CHG (Chlorhexidine gluconate) soap is an antiseptic cleanser which kills germs and bonds with the skin to continue killing germs even after washing.  Please do not use if you have an allergy to CHG or antibacterial soaps. If your skin becomes reddened/irritated stop using the CHG.  1. Shower the NIGHT BEFORE SURGERY and the MORNING OF SURGERY with CHG soap.  2. If you choose to wash your hair, wash your hair first as usual with your normal shampoo.  3. After shampooing, rinse your hair and body thoroughly to remove the shampoo.  4. Use CHG as you would any other liquid soap. You can apply CHG directly to the skin and wash gently with a scrungie or a clean washcloth.  5. Apply the CHG soap to your body only from the neck down. Do not use on open wounds or open sores. Avoid contact with your eyes, ears, mouth, and genitals (private parts). Wash face and genitals (private parts) with your normal soap.  6. Wash thoroughly, paying special attention to the area where your surgery will be performed.  7. Thoroughly rinse your body with warm water.  8. Do not shower/wash with your normal soap after using and rinsing off the CHG soap.  9. Pat yourself dry with a clean towel.  10. Wear clean pajamas to bed the night before surgery.  12. Place clean sheets on your bed the night of your first shower and do not sleep with pets.  13. Shower again with the CHG soap on the day of surgery prior to arriving at the hospital.  14. Do not apply any deodorants/lotions/powders.  15. Please wear clean clothes to the hospital.

## 2022-03-17 ENCOUNTER — Telehealth: Payer: Self-pay | Admitting: Psychiatry

## 2022-03-17 NOTE — Telephone Encounter (Signed)
Thank you for the information.

## 2022-03-17 NOTE — Telephone Encounter (Signed)
Patient called to cancel January 2024 appointment. States she is having surgery on 03-20-22 and by January will have radiation everyday for 6-8 weeks. She needs 3 days notice for transportation and between that and the radiation she is not sure when she will get back her. Wanted you to be updated.

## 2022-03-18 ENCOUNTER — Encounter: Payer: Self-pay | Admitting: *Deleted

## 2022-03-18 NOTE — Progress Notes (Signed)
Ms. Sequeira called with a couple questions about starting radiation.  All questions answered to her satisfaction.   She is scheduled for surgery on 12/15.

## 2022-03-20 ENCOUNTER — Other Ambulatory Visit: Payer: Self-pay

## 2022-03-20 ENCOUNTER — Ambulatory Visit
Admission: RE | Admit: 2022-03-20 | Discharge: 2022-03-20 | Disposition: A | Discharge status: Home / Self Care | Payer: Medicare Other | Source: Ambulatory Visit | Attending: General Surgery | Admitting: General Surgery

## 2022-03-20 ENCOUNTER — Encounter
Admission: RE | Disposition: A | Discharge status: Home / Self Care | Payer: Self-pay | Source: Home / Self Care | Attending: General Surgery

## 2022-03-20 ENCOUNTER — Ambulatory Visit: Payer: Medicare Other | Admitting: Urgent Care

## 2022-03-20 ENCOUNTER — Ambulatory Visit
Admission: RE | Admit: 2022-03-20 | Discharge: 2022-03-20 | Disposition: A | Discharge status: Home / Self Care | Payer: Medicare Other | Attending: General Surgery | Admitting: General Surgery

## 2022-03-20 ENCOUNTER — Ambulatory Visit: Payer: Medicare Other | Admitting: Anesthesiology

## 2022-03-20 ENCOUNTER — Encounter: Payer: Self-pay | Admitting: General Surgery

## 2022-03-20 DIAGNOSIS — E669 Obesity, unspecified: Secondary | ICD-10-CM | POA: Insufficient documentation

## 2022-03-20 DIAGNOSIS — Z6838 Body mass index (BMI) 38.0-38.9, adult: Secondary | ICD-10-CM | POA: Insufficient documentation

## 2022-03-20 DIAGNOSIS — E039 Hypothyroidism, unspecified: Secondary | ICD-10-CM | POA: Insufficient documentation

## 2022-03-20 DIAGNOSIS — I10 Essential (primary) hypertension: Secondary | ICD-10-CM | POA: Diagnosis not present

## 2022-03-20 DIAGNOSIS — K219 Gastro-esophageal reflux disease without esophagitis: Secondary | ICD-10-CM | POA: Diagnosis not present

## 2022-03-20 DIAGNOSIS — F250 Schizoaffective disorder, bipolar type: Secondary | ICD-10-CM | POA: Insufficient documentation

## 2022-03-20 DIAGNOSIS — C50212 Malignant neoplasm of upper-inner quadrant of left female breast: Secondary | ICD-10-CM

## 2022-03-20 DIAGNOSIS — F419 Anxiety disorder, unspecified: Secondary | ICD-10-CM | POA: Diagnosis not present

## 2022-03-20 DIAGNOSIS — Z79899 Other long term (current) drug therapy: Secondary | ICD-10-CM | POA: Diagnosis not present

## 2022-03-20 DIAGNOSIS — F17200 Nicotine dependence, unspecified, uncomplicated: Secondary | ICD-10-CM | POA: Insufficient documentation

## 2022-03-20 DIAGNOSIS — Z79890 Hormone replacement therapy (postmenopausal): Secondary | ICD-10-CM | POA: Insufficient documentation

## 2022-03-20 DIAGNOSIS — Z170 Estrogen receptor positive status [ER+]: Secondary | ICD-10-CM | POA: Insufficient documentation

## 2022-03-20 DIAGNOSIS — D0512 Intraductal carcinoma in situ of left breast: Secondary | ICD-10-CM | POA: Insufficient documentation

## 2022-03-20 DIAGNOSIS — Z803 Family history of malignant neoplasm of breast: Secondary | ICD-10-CM | POA: Insufficient documentation

## 2022-03-20 HISTORY — PX: PART MASTECTOMY,RADIO FREQUENCY LOCALIZER,AXILLARY SENTINEL NODE BIOPSY: SHX6901

## 2022-03-20 HISTORY — PX: BREAST LUMPECTOMY: SHX2

## 2022-03-20 SURGERY — PART MASTECTOMY,RADIO FREQUENCY LOCALIZER,AXILLARY SENTINEL NODE BIOPSY
Anesthesia: General | Site: Breast | Laterality: Left

## 2022-03-20 MED ORDER — FENTANYL CITRATE (PF) 100 MCG/2ML IJ SOLN
INTRAMUSCULAR | Status: AC
  Administered 2022-03-20: 50 ug via INTRAVENOUS
  Filled 2022-03-20: qty 2

## 2022-03-20 MED ORDER — CEFAZOLIN SODIUM-DEXTROSE 2-4 GM/100ML-% IV SOLN
2.0000 g | INTRAVENOUS | Status: AC
  Administered 2022-03-20: 2 g via INTRAVENOUS

## 2022-03-20 MED ORDER — CEFAZOLIN SODIUM-DEXTROSE 2-4 GM/100ML-% IV SOLN
INTRAVENOUS | Status: AC
  Filled 2022-03-20: qty 100

## 2022-03-20 MED ORDER — MIDAZOLAM HCL 2 MG/2ML IJ SOLN
INTRAMUSCULAR | Status: DC | PRN
  Administered 2022-03-20: 2 mg via INTRAVENOUS

## 2022-03-20 MED ORDER — BUPIVACAINE-EPINEPHRINE (PF) 0.5% -1:200000 IJ SOLN
INTRAMUSCULAR | Status: AC
  Filled 2022-03-20: qty 30

## 2022-03-20 MED ORDER — OXYCODONE HCL 5 MG PO TABS
ORAL_TABLET | ORAL | Status: AC
  Filled 2022-03-20: qty 1

## 2022-03-20 MED ORDER — OXYCODONE HCL 5 MG PO TABS
5.0000 mg | ORAL_TABLET | Freq: Once | ORAL | Status: AC | PRN
  Administered 2022-03-20: 5 mg via ORAL

## 2022-03-20 MED ORDER — DEXAMETHASONE SODIUM PHOSPHATE 10 MG/ML IJ SOLN
INTRAMUSCULAR | Status: DC | PRN
  Administered 2022-03-20: 10 mg via INTRAVENOUS

## 2022-03-20 MED ORDER — LACTATED RINGERS IV SOLN: INTRAVENOUS | Status: DC

## 2022-03-20 MED ORDER — ONDANSETRON HCL 4 MG/2ML IJ SOLN
INTRAMUSCULAR | Status: AC
  Filled 2022-03-20: qty 2

## 2022-03-20 MED ORDER — DEXAMETHASONE SODIUM PHOSPHATE 10 MG/ML IJ SOLN
INTRAMUSCULAR | Status: AC
  Filled 2022-03-20: qty 1

## 2022-03-20 MED ORDER — ACETAMINOPHEN 10 MG/ML IV SOLN: 1000.0000 mg | Freq: Once | INTRAVENOUS | Status: DC | PRN

## 2022-03-20 MED ORDER — BUPIVACAINE-EPINEPHRINE (PF) 0.5% -1:200000 IJ SOLN
INTRAMUSCULAR | Status: DC | PRN
  Administered 2022-03-20: 10 mL
  Administered 2022-03-20: 20 mL

## 2022-03-20 MED ORDER — OXYCODONE HCL 5 MG/5ML PO SOLN: 5.0000 mg | Freq: Once | ORAL | Status: AC | PRN

## 2022-03-20 MED ORDER — GLYCOPYRROLATE 0.2 MG/ML IJ SOLN
INTRAMUSCULAR | Status: AC
  Filled 2022-03-20: qty 1

## 2022-03-20 MED ORDER — VASOPRESSIN 20 UNIT/ML IV SOLN
INTRAVENOUS | Status: AC
  Filled 2022-03-20: qty 1

## 2022-03-20 MED ORDER — PHENYLEPHRINE 80 MCG/ML (10ML) SYRINGE FOR IV PUSH (FOR BLOOD PRESSURE SUPPORT)
PREFILLED_SYRINGE | INTRAVENOUS | Status: AC
  Filled 2022-03-20: qty 10

## 2022-03-20 MED ORDER — KETAMINE HCL 10 MG/ML IJ SOLN
INTRAMUSCULAR | Status: DC | PRN
  Administered 2022-03-20: 25 mg via INTRAVENOUS

## 2022-03-20 MED ORDER — ACETAMINOPHEN 500 MG PO TABS
ORAL_TABLET | ORAL | Status: AC
  Filled 2022-03-20: qty 2

## 2022-03-20 MED ORDER — HYDROCODONE-ACETAMINOPHEN 5-325 MG PO TABS: 1.0000 | ORAL_TABLET | ORAL | 0 refills | Status: AC | PRN

## 2022-03-20 MED ORDER — CHLORHEXIDINE GLUCONATE 0.12 % MT SOLN: 15.0000 mL | Freq: Once | OROMUCOSAL | Status: AC

## 2022-03-20 MED ORDER — ACETAMINOPHEN 10 MG/ML IV SOLN
INTRAVENOUS | Status: AC
  Filled 2022-03-20: qty 100

## 2022-03-20 MED ORDER — PROPOFOL 10 MG/ML IV BOLUS
INTRAVENOUS | Status: AC
  Filled 2022-03-20: qty 20

## 2022-03-20 MED ORDER — LIDOCAINE HCL (PF) 2 % IJ SOLN
INTRAMUSCULAR | Status: AC
  Filled 2022-03-20: qty 5

## 2022-03-20 MED ORDER — EPHEDRINE 5 MG/ML INJ
INTRAVENOUS | Status: AC
  Filled 2022-03-20: qty 5

## 2022-03-20 MED ORDER — EPHEDRINE SULFATE (PRESSORS) 50 MG/ML IJ SOLN
INTRAMUSCULAR | Status: DC | PRN
  Administered 2022-03-20 (×2): 10 mg via INTRAVENOUS
  Administered 2022-03-20: 5 mg via INTRAVENOUS

## 2022-03-20 MED ORDER — DROPERIDOL 2.5 MG/ML IJ SOLN: 0.6250 mg | Freq: Once | INTRAMUSCULAR | Status: DC | PRN

## 2022-03-20 MED ORDER — GLYCOPYRROLATE 0.2 MG/ML IJ SOLN
INTRAMUSCULAR | Status: DC | PRN
  Administered 2022-03-20: .2 mg via INTRAVENOUS

## 2022-03-20 MED ORDER — MIDAZOLAM HCL 2 MG/2ML IJ SOLN
INTRAMUSCULAR | Status: AC
  Filled 2022-03-20: qty 2

## 2022-03-20 MED ORDER — STERILE WATER FOR IRRIGATION IR SOLN
Status: DC | PRN
  Administered 2022-03-20: 700 mL

## 2022-03-20 MED ORDER — FENTANYL CITRATE (PF) 100 MCG/2ML IJ SOLN
25.0000 ug | INTRAMUSCULAR | Status: DC | PRN
  Administered 2022-03-20: 50 ug via INTRAVENOUS

## 2022-03-20 MED ORDER — ORAL CARE MOUTH RINSE: 15.0000 mL | Freq: Once | OROMUCOSAL | Status: AC

## 2022-03-20 MED ORDER — KETAMINE HCL 50 MG/5ML IJ SOSY
PREFILLED_SYRINGE | INTRAMUSCULAR | Status: AC
  Filled 2022-03-20: qty 5

## 2022-03-20 MED ORDER — FENTANYL CITRATE (PF) 100 MCG/2ML IJ SOLN
INTRAMUSCULAR | Status: DC | PRN
  Administered 2022-03-20 (×2): 25 ug via INTRAVENOUS
  Administered 2022-03-20: 50 ug via INTRAVENOUS

## 2022-03-20 MED ORDER — CHLORHEXIDINE GLUCONATE 0.12 % MT SOLN
OROMUCOSAL | Status: AC
  Administered 2022-03-20: 15 mL via OROMUCOSAL
  Filled 2022-03-20: qty 15

## 2022-03-20 MED ORDER — PROMETHAZINE HCL 25 MG/ML IJ SOLN: 6.2500 mg | INTRAMUSCULAR | Status: DC | PRN

## 2022-03-20 MED ORDER — HEMOSTATIC AGENTS (NO CHARGE) OPTIME
TOPICAL | Status: DC | PRN
  Administered 2022-03-20: 1 via TOPICAL

## 2022-03-20 MED ORDER — LIDOCAINE HCL (CARDIAC) PF 100 MG/5ML IV SOSY
PREFILLED_SYRINGE | INTRAVENOUS | Status: DC | PRN
  Administered 2022-03-20: 100 mg via INTRAVENOUS

## 2022-03-20 MED ORDER — VASOPRESSIN 20 UNIT/ML IV SOLN
INTRAVENOUS | Status: DC | PRN
  Administered 2022-03-20 (×2): 1 [IU] via INTRAVENOUS

## 2022-03-20 MED ORDER — PHENYLEPHRINE HCL (PRESSORS) 10 MG/ML IV SOLN
INTRAVENOUS | Status: DC | PRN
  Administered 2022-03-20: 120 ug via INTRAVENOUS
  Administered 2022-03-20: 80 ug via INTRAVENOUS
  Administered 2022-03-20: 120 ug via INTRAVENOUS
  Administered 2022-03-20: 160 ug via INTRAVENOUS

## 2022-03-20 MED ORDER — TECHNETIUM TC 99M TILMANOCEPT KIT
1.0000 | PACK | Freq: Once | INTRAVENOUS | Status: AC | PRN
  Administered 2022-03-20: 1.03 via INTRADERMAL

## 2022-03-20 MED ORDER — ONDANSETRON HCL 4 MG/2ML IJ SOLN
INTRAMUSCULAR | Status: DC | PRN
  Administered 2022-03-20: 4 mg via INTRAVENOUS

## 2022-03-20 MED ORDER — ACETAMINOPHEN 500 MG PO TABS
1000.0000 mg | ORAL_TABLET | Freq: Once | ORAL | Status: AC
  Administered 2022-03-20: 1000 mg via ORAL

## 2022-03-20 MED ORDER — PROPOFOL 10 MG/ML IV BOLUS
INTRAVENOUS | Status: DC | PRN
  Administered 2022-03-20: 140 mg via INTRAVENOUS
  Administered 2022-03-20 (×2): 30 mg via INTRAVENOUS

## 2022-03-20 MED ORDER — FENTANYL CITRATE (PF) 100 MCG/2ML IJ SOLN
INTRAMUSCULAR | Status: AC
  Filled 2022-03-20: qty 2

## 2022-03-20 SURGICAL SUPPLY — 48 items
ADH SKN CLS APL DERMABOND .7 (GAUZE/BANDAGES/DRESSINGS) ×1
APL PRP STRL LF DISP 70% ISPRP (MISCELLANEOUS) ×1
BLADE SURG 15 STRL LF DISP TIS (BLADE) ×2 IMPLANT
BLADE SURG 15 STRL SS (BLADE) ×2
CHLORAPREP W/TINT 26 (MISCELLANEOUS) ×1 IMPLANT
CNTNR SPEC 2.5X3XGRAD LEK (MISCELLANEOUS)
CONT SPEC 4OZ STRL OR WHT (MISCELLANEOUS)
CONTAINER SPEC 2.5X3XGRAD LEK (MISCELLANEOUS) IMPLANT
COVER PROBE GAMMA FINDER SLV (MISCELLANEOUS) ×1 IMPLANT
COVER PROBE ULTRASOUND 5X96 (MISCELLANEOUS) IMPLANT
DERMABOND ADVANCED .7 DNX12 (GAUZE/BANDAGES/DRESSINGS) ×1 IMPLANT
DEVICE DUBIN SPECIMEN MAMMOGRA (MISCELLANEOUS) ×1 IMPLANT
DRAPE LAPAROTOMY TRNSV 106X77 (MISCELLANEOUS) ×1 IMPLANT
DRSG GAUZE FLUFF 36X18 (GAUZE/BANDAGES/DRESSINGS) IMPLANT
ELECT CAUTERY BLADE 6.4 (BLADE) ×1 IMPLANT
ELECT REM PT RETURN 9FT ADLT (ELECTROSURGICAL) ×1
ELECTRODE REM PT RTRN 9FT ADLT (ELECTROSURGICAL) ×1 IMPLANT
GAUZE 4X4 16PLY ~~LOC~~+RFID DBL (SPONGE) ×1 IMPLANT
GLOVE BIO SURGEON STRL SZ 6.5 (GLOVE) ×1 IMPLANT
GLOVE BIOGEL PI IND STRL 6.5 (GLOVE) ×1 IMPLANT
GOWN STRL REUS W/ TWL LRG LVL3 (GOWN DISPOSABLE) ×2 IMPLANT
GOWN STRL REUS W/TWL LRG LVL3 (GOWN DISPOSABLE) ×2
HEMOSTAT ARISTA ABSORB 1G (HEMOSTASIS) IMPLANT
KIT MARKER MARGIN INK (KITS) IMPLANT
KIT TURNOVER KIT A (KITS) ×1 IMPLANT
LABEL OR SOLS (LABEL) ×1 IMPLANT
MANIFOLD NEPTUNE II (INSTRUMENTS) ×1 IMPLANT
MARKER MARGIN CORRECT CLIP (MARKER) IMPLANT
NDL HYPO 25X1 1.5 SAFETY (NEEDLE) ×1 IMPLANT
NEEDLE HYPO 22GX1.5 SAFETY (NEEDLE) ×1 IMPLANT
NEEDLE HYPO 25X1 1.5 SAFETY (NEEDLE) ×1 IMPLANT
PACK BASIN MINOR ARMC (MISCELLANEOUS) ×1 IMPLANT
RETRACTOR RING XSMALL (MISCELLANEOUS) IMPLANT
RTRCTR WOUND ALEXIS 13CM XS SH (MISCELLANEOUS) ×1
SET LOCALIZER 20 PROBE US (MISCELLANEOUS) ×1 IMPLANT
SPONGE T-LAP 18X36 ~~LOC~~+RFID STR (SPONGE) IMPLANT
SUT MNCRL 4-0 (SUTURE) ×2
SUT MNCRL 4-0 27XMFL (SUTURE) ×2
SUT SILK 2 0 SH (SUTURE) ×1 IMPLANT
SUT VIC AB 3-0 SH 27 (SUTURE) ×2
SUT VIC AB 3-0 SH 27X BRD (SUTURE) ×2 IMPLANT
SUTURE MNCRL 4-0 27XMF (SUTURE) ×2 IMPLANT
SYR 10ML LL (SYRINGE) ×2 IMPLANT
SYR BULB IRRIG 60ML STRL (SYRINGE) ×1 IMPLANT
TRAP FLUID SMOKE EVACUATOR (MISCELLANEOUS) ×1 IMPLANT
TRAP NEPTUNE SPECIMEN COLLECT (MISCELLANEOUS) ×1 IMPLANT
WATER STERILE IRR 1000ML POUR (IV SOLUTION) ×1 IMPLANT
WATER STERILE IRR 500ML POUR (IV SOLUTION) ×1 IMPLANT

## 2022-03-20 NOTE — Transfer of Care (Signed)
Immediate Anesthesia Transfer of Care Note  Patient: Patricia Good  Procedure(s) Performed: PART [place] NODE BIOPSY (Left: Breast)  Patient Location: PACU  Anesthesia Type:General  Level of Consciousness: awake  Airway & Oxygen Therapy: Patient Spontanous Breathing and Patient connected to face mask oxygen  Post-op Assessment: Report given to RN and Post -op Vital signs reviewed and stable  Post vital signs: Reviewed and stable  Last Vitals:  Vitals Value Taken Time  BP 118/73 03/20/22 1507  Temp 96.7   Pulse 92 03/20/22 1512  Resp 12 03/20/22 1512  SpO2 100 % 03/20/22 1512  Vitals shown include unvalidated device data.  Last Pain:  Vitals:   03/20/22 1051  TempSrc: Tympanic  PainSc: 0-No pain         Complications: No notable events documented.

## 2022-03-20 NOTE — Anesthesia Preprocedure Evaluation (Addendum)
Anesthesia Evaluation  Patient identified by MRN, date of birth, ID band Patient awake    Reviewed: Allergy & Precautions, NPO status , Patient's Chart, lab work & pertinent test results  Airway Mallampati: III  TM Distance: >3 FB Neck ROM: full    Dental no notable dental hx.    Pulmonary shortness of breath and with exertion, COPD,  COPD inhaler, Current Smoker    + wheezing      Cardiovascular hypertension, Normal cardiovascular exam     Neuro/Psych  PSYCHIATRIC DISORDERS Anxiety  Bipolar Disorder   Schizoaffective disorder PTSDnegative neurological ROS     GI/Hepatic Neg liver ROS,GERD  ,,  Endo/Other  Hypothyroidism    Renal/GU      Musculoskeletal   Abdominal  (+) + obese  Peds  Hematology negative hematology ROS (+)   Anesthesia Other Findings Past Medical History: No date: Abnormal thyroid stimulating hormone (TSH) level No date: Anemia No date: Anxiety No date: Asthma No date: Bipolar 1 disorder (HCC) No date: COPD (chronic obstructive pulmonary disease) (HCC) No date: Endometriosis No date: GERD (gastroesophageal reflux disease) No date: Hepatitis A No date: Hypertension No date: Hypothyroidism 03/2022: Malignant neoplasm of upper-inner quadrant of left breast in  female, estrogen receptor positive ([place]) No date: Pre-diabetes No date: PTSD (post-traumatic stress disorder) No date: Schizoaffective disorder [facility])  Past Surgical History: 03/03/2022: BREAST BIOPSY; Left     Comment:  Left breast stereo bx Ribbon Clip- path pending 03/03/2022: BREAST BIOPSY; Left     Comment:  MM LT BREAST BX W LOC DEV 1ST LESION IMAGE BX SPEC               STEREO GUIDE 03/03/2022 [facility] No date: ECTOPIC PREGNANCY SURGERY 1990: TOTAL ABDOMINAL HYSTERECTOMY W/ BILATERAL SALPINGOOPHORECTOMY     Reproductive/Obstetrics negative OB ROS                             Anesthesia  Physical Anesthesia Plan  ASA: 3  Anesthesia Plan: General LMA   Post-op Pain Management: Tylenol PO (pre-op)* and Regional block*   Induction: Intravenous  PONV Risk Score and Plan: 3 and Dexamethasone, Ondansetron and Midazolam  Airway Management Planned: LMA  Additional Equipment:   Intra-op Plan:   Post-operative Plan: Extubation in OR  Informed Consent: I have reviewed the patients History and Physical, chart, labs and discussed the procedure including the risks, benefits and alternatives for the proposed anesthesia with the patient or authorized representative who has indicated his/her understanding and acceptance.     Dental Advisory Given  Plan Discussed with: Anesthesiologist, CRNA and Surgeon  Anesthesia Plan Comments:         Anesthesia Quick Evaluation

## 2022-03-20 NOTE — Progress Notes (Signed)
Patient awake/alert x4.  Of note "red" area left breast area, per Dr. Peyton Najjar believes r/t "numbing" medicine. No new orders. Patient tolerated gingerale and crackers.

## 2022-03-20 NOTE — Op Note (Signed)
Preoperative diagnosis: Left breast carcinoma.  Postoperative diagnosis: Same.   Procedure: Left radiofrequency tag-localized partial mastectomy.                      Left Axillary Sentinel Lymph node biopsy  Anesthesia: GETA  Surgeon: Dr. Windell Moment  Wound Classification: Clean  Indications: Patient is a 61 y.o. female with a nonpalpable left breast mass noted on mammography with core biopsy demonstrating invasive mammary carcinoma requires radiofrequency tag-localized partial mastectomy for treatment with sentinel lymph node biopsy.   Findings: 1. Specimen mammography shows marker and tag on specimen 2. Pathology call refers gross examination of margins was grossly negative 3. No other palpable mass or lymph node identified.   Description of procedure: Preoperative radiofrequency tag localization was performed by radiology. In the nuclear medicine suite, the subareolar region was injected with Tc-99 sulfur colloid. Localization studies were reviewed. The patient was taken to the operating room and placed supine on the operating table, and after general anesthesia the left chest and axilla were prepped and draped in the usual sterile fashion. A time-out was completed verifying correct patient, procedure, site, positioning, and implant(s) and/or special equipment prior to beginning this procedure.  By comparing the localization studies and interrogation with Localizer device, the probable trajectory and location of the mass was visualized. A circumareolar skin incision was planned in such a way as to minimize the amount of dissection to reach the mass.  The skin incision was made. Flaps were raised and the location of the tag was confirmed with Localizer device confirmed. A 2-0 silk figure-of-eight stay suture was placed and used for retraction. Dissection was then taken down circumferentially, taking care to include the entire localizing tag and a wide margin of grossly normal tissue. The  specimen and entire localizing tag were removed. The specimen was oriented and sent to radiology with the localization studies. Confirmation was received that the entire target lesion had been resected. The wound was irrigated. Hemostasis was checked. The wound was closed with interrupted sutures of 3-0 Vicryl and a subcuticular suture of Monocryl 3-0. No attempt was made to close the dead space.   A hand-held gamma probe was used to identify the location of the hottest spot in the axilla. An incision was made around the caudal axillary hairline. Dissection was carried down until subdermal facias was advanced. The probe was placed and again, the point of maximal count was found. Dissection continue until nodule was identified. The probe was placed in contact with the node. The node was excised in its entirety.  An additional hot spot was detected and the node was excised in similar fashion. No additional hot spots were identified. No clinically abnormal nodes were palpated. The procedure was terminated. Hemostasis was achieved and the wound closed in layers with deep interrupted 3-0 Vicryl and skin was closed with subcuticular suture of Monocryl 3-0.  The patient tolerated the procedure well and was taken to the postanesthesia care unit in stable condition.   Sentinel Node Biopsy Synoptic Operative Report  Operation performed with curative intent:Yes  Tracer(s) used to identify sentinel nodes in the upfront surgery (non-neoadjuvant) setting (select all that apply):Radioactive Tracer  Tracer(s) used to identify sentinel nodes in the neoadjuvant setting (select all that apply):N/A  All nodes (colored or non-colored) present at the end of a dye-filled lymphatic channel were removed:N/A  All significantly radioactive nodes were removed:Yes  All palpable suspicious nodes were removed:N/A  Biopsy-proven positive nodes marked with clips  prior to chemotherapy were identified and removed:N/A  Specimen:  Left Breast mass                     Sentinel Lymph nodes #1, #2  Complications: None  Estimated Blood Loss: 25 mL

## 2022-03-20 NOTE — Discharge Instructions (Addendum)

## 2022-03-20 NOTE — Interval H&P Note (Signed)
History and Physical Interval Note:  03/20/2022 12:43 PM  Patricia Good  has presented today for surgery, with the diagnosis of C50.212, Z17.0 Malignant neoplasm of upper inner quadrant of lt breast in female, estrogen receptor postiive.  The various methods of treatment have been discussed with the patient and family. After consideration of risks, benefits and other options for treatment, the patient has consented to  Procedure(s): PART [place] (Left) as a surgical intervention.  The patient's history has been reviewed, patient examined, no change in status, stable for surgery.  I have reviewed the patient's chart and labs.  Questions were answered to the patient's satisfaction.     Herbert Pun

## 2022-03-20 NOTE — Anesthesia Procedure Notes (Signed)
Procedure Name: LMA Insertion Date/Time: 03/20/2022 1:24 PM  Performed by: Wang [place], [place], CRNAPre-anesthesia Checklist: Patient identified, Patient being monitored, Timeout performed, Emergency Drugs available and Suction available Patient Re-evaluated:Patient Re-evaluated prior to induction Oxygen Delivery Method: Circle system utilized Preoxygenation: Pre-oxygenation with 100% oxygen Induction Type: IV induction Ventilation: Mask ventilation without difficulty LMA: LMA inserted LMA Size: 4.0 Tube type: Oral Number of attempts: 1 Placement Confirmation: positive ETCO2 and breath sounds checked- equal and bilateral Tube secured with: Tape Dental Injury: Teeth and Oropharynx as per pre-operative assessment

## 2022-03-23 ENCOUNTER — Encounter: Payer: Self-pay | Admitting: General Surgery

## 2022-03-23 NOTE — Anesthesia Postprocedure Evaluation (Signed)
Anesthesia Post Note  Patient: Patricia Good  Procedure(s) Performed: PART MASTECTOMY,RADIO FREQUENCY LOCALIZER,AXILLARY SENTINEL NODE BIOPSY (Left: Breast)  Patient location during evaluation: PACU Anesthesia Type: General Level of consciousness: awake and alert Pain management: pain level controlled Vital Signs Assessment: post-procedure vital signs reviewed and stable Respiratory status: spontaneous breathing, nonlabored ventilation and respiratory function stable Cardiovascular status: blood pressure returned to baseline and stable Postop Assessment: no apparent nausea or vomiting Anesthetic complications: no   No notable events documented.   Last Vitals:  Vitals:   03/20/22 1545 03/20/22 1553  BP: 117/65 132/70  Pulse:  95  Resp: 16 16  Temp:  (!) 36.1 C  SpO2:  97%    Last Pain:  Vitals:   03/20/22 1553  TempSrc: Temporal  PainSc: [place]

## 2022-03-24 ENCOUNTER — Other Ambulatory Visit: Payer: Self-pay | Admitting: Anatomic Pathology & Clinical Pathology

## 2022-03-24 LAB — SURGICAL PATHOLOGY

## 2022-03-31 ENCOUNTER — Encounter: Payer: Self-pay | Admitting: *Deleted

## 2022-03-31 NOTE — Progress Notes (Signed)
Oncotype Dx order [id number] submitted online on path specimen [id number].

## 2022-04-07 ENCOUNTER — Encounter: Payer: Self-pay | Admitting: Oncology

## 2022-04-07 ENCOUNTER — Ambulatory Visit
Admission: RE | Admit: 2022-04-07 | Discharge: 2022-04-07 | Disposition: A | Discharge status: Home / Self Care | Payer: Medicare HMO | Source: Ambulatory Visit | Attending: Radiation Oncology | Admitting: Radiation Oncology

## 2022-04-07 ENCOUNTER — Encounter: Payer: Self-pay | Admitting: Radiation Oncology

## 2022-04-07 ENCOUNTER — Inpatient Hospital Stay: Payer: Medicare HMO | Attending: Oncology | Admitting: Oncology

## 2022-04-07 VITALS — BP 131/75 | HR 91 | Temp 95.5°F | Resp 16 | Ht 62.0 in | Wt 210.0 lb

## 2022-04-07 VITALS — BP 131/75 | HR 91 | Temp 95.5°F | Resp 16 | Wt 210.0 lb

## 2022-04-07 DIAGNOSIS — M810 Age-related osteoporosis without current pathological fracture: Secondary | ICD-10-CM | POA: Diagnosis not present

## 2022-04-07 DIAGNOSIS — Z170 Estrogen receptor positive status [ER+]: Secondary | ICD-10-CM | POA: Insufficient documentation

## 2022-04-07 DIAGNOSIS — Z510 Encounter for antineoplastic radiation therapy: Secondary | ICD-10-CM | POA: Diagnosis present

## 2022-04-07 DIAGNOSIS — C50212 Malignant neoplasm of upper-inner quadrant of left female breast: Secondary | ICD-10-CM | POA: Diagnosis present

## 2022-04-07 DIAGNOSIS — Z7189 Other specified counseling: Secondary | ICD-10-CM | POA: Diagnosis not present

## 2022-04-07 DIAGNOSIS — Z79899 Other long term (current) drug therapy: Secondary | ICD-10-CM | POA: Diagnosis not present

## 2022-04-07 DIAGNOSIS — F17210 Nicotine dependence, cigarettes, uncomplicated: Secondary | ICD-10-CM | POA: Diagnosis not present

## 2022-04-07 NOTE — Progress Notes (Signed)
Patient here for oncology follow-up appointment, expresses no complaints or concerns at this time.    

## 2022-04-07 NOTE — Progress Notes (Signed)
Hematology/Oncology Consult note [facility]  [phone] [phone]  Patient Care Team: Entzminger, Mendel Corning, MD as PCP - General (Internal Medicine) Daiva Huge, RN as Oncology Nurse Navigator   Name of the patient: Patricia Good  [id number]  [birth year]   Date of visit: 04/07/22  Diagnosis-pathological prognostic stage Ib invasive mammary carcinoma of the left breast pT1a N0 M0 ER/PR positive HER2 negative  Chief complaint/ Reason for visit- discuss pathology results and further management  Heme/Onc history: Patient is a 62 year old female who underwent a routine screening bilateral mammogram in October 2023 which showed possible calcifications in the left breast. This was followed by a diagnostic mammogram which showed a 3 mm group of calcifications in the upper inner quadrant of the left breast and a biopsy was recommended. Core biopsy showed invasive mammary carcinoma 2 mm grade 2 ER greater than 90% positive PR greater than 90% positive and HER2 negative.   Patient has baseline osteoporosis and is currently on weekly Fosamax  Interval history-she is recovering well from her lumpectomy and denies any specific complaints at this time  ECOG PS- 1 Pain scale- 0   Review of systems- Review of Systems  Constitutional:  Negative for chills, fever, malaise/fatigue and weight loss.  HENT:  Negative for congestion, ear discharge and nosebleeds.   Eyes:  Negative for blurred vision.  Respiratory:  Negative for cough, hemoptysis, sputum production, shortness of breath and wheezing.   Cardiovascular:  Negative for chest pain, palpitations, orthopnea and claudication.  Gastrointestinal:  Negative for abdominal pain, blood in stool, constipation, diarrhea, heartburn, melena, nausea and vomiting.  Genitourinary:  Negative for dysuria, flank pain, frequency, hematuria and urgency.  Musculoskeletal:  Negative for back pain, joint pain and  myalgias.  Skin:  Negative for rash.  Neurological:  Negative for dizziness, tingling, focal weakness, seizures, weakness and headaches.  Endo/Heme/Allergies:  Does not bruise/bleed easily.  Psychiatric/Behavioral:  Negative for depression and suicidal ideas. The patient does not have insomnia.       Allergies  Allergen Reactions   Shellfish Allergy Anaphylaxis    Only to crab meat (non-imitation), not shrimp   Sulfa Antibiotics Hives, Anaphylaxis and Other (See Comments)   Sulfasalazine Anaphylaxis and Hives    Other reaction(s): Other (See Comments)   Lisinopril     unknown     Past Medical History:  Diagnosis Date   Abnormal thyroid stimulating hormone (TSH) level    Anemia    Anxiety    Asthma    Bipolar 1 disorder (HCC)    COPD (chronic obstructive pulmonary disease) (HCC)    Endometriosis    GERD (gastroesophageal reflux disease)    Hepatitis A    Hypertension    Hypothyroidism    Malignant neoplasm of upper-inner quadrant of left breast in female, estrogen receptor positive ([place]) 03/2022   Pre-diabetes    PTSD (post-traumatic stress disorder)    Schizoaffective disorder ([place])      Past Surgical History:  Procedure Laterality Date   BREAST BIOPSY Left 03/03/2022   Left breast stereo bx Ribbon Clip- path pending   BREAST BIOPSY Left 03/03/2022   MM LT BREAST BX W LOC DEV 1ST LESION IMAGE BX SPEC STEREO GUIDE 03/03/2022 [facility]   ECTOPIC PREGNANCY SURGERY     PART MASTECTOMY,RADIO FREQUENCY LOCALIZER,AXILLARY SENTINEL NODE BIOPSY Left 03/20/2022   Procedure: PART MASTECTOMY,RADIO FREQUENCY LOCALIZER,AXILLARY SENTINEL NODE BIOPSY;  Surgeon: Herbert Pun, MD;  Location: [facility] ORS;  Service: General;  Laterality:  Left;   TOTAL ABDOMINAL HYSTERECTOMY W/ BILATERAL SALPINGOOPHORECTOMY  1990    Social History   Socioeconomic History   Marital status: Single    Spouse name: Not on file   Number of children: 0   Years of education: Not on file    Highest education level: Associate degree: occupational, Hotel manager, or vocational program  Occupational History   Not on file  Tobacco Use   Smoking status: Every Day    Packs/day: 0.50    Types: Cigarettes    Start date: 01/22/1969   Smokeless tobacco: Never   Tobacco comments:    7-10 cigarettes a day reported on 03/10/2022.   Vaping Use   Vaping Use: Never used  Substance and Sexual Activity   Alcohol use: Yes    Alcohol/week: 1.0 - 2.0 standard drink of alcohol    Types: 1 Glasses of wine per week    Comment: occasional   Drug use: No   Sexual activity: Not Currently  Other Topics Concern   Not on file  Social History Narrative   Lives alone   Social Determinants of Health   Financial Resource Strain: Medium Risk (06/15/2017)   Overall Financial Resource Strain (CARDIA)    Difficulty of Paying Living Expenses: Somewhat hard  Food Insecurity: Food Insecurity Present (06/15/2017)   Hunger Vital Sign    Worried About Running Out of Food in the Last Year: Sometimes true    Ran Out of Food in the Last Year: Sometimes true  Transportation Needs: No Transportation Needs (06/15/2017)   PRAPARE - Hydrologist (Medical): No    Lack of Transportation (Non-Medical): No  Physical Activity: Inactive (06/15/2017)   Exercise Vital Sign    Days of Exercise per Week: 0 days    Minutes of Exercise per Session: 0 min  Stress: Stress Concern Present (06/15/2017)   [place]    Feeling of Stress : Very much  Social Connections: Socially Isolated (06/15/2017)   Social Connection and Isolation Panel [NHANES]    Frequency of Communication with Friends and Family: Never    Frequency of Social Gatherings with Friends and Family: Never    Attends Religious Services: Never    Marine scientist or Organizations: No    Attends Archivist Meetings: Never    Marital Status: Divorced   Human resources officer Violence: Not At Risk (06/15/2017)   Humiliation, Afraid, Rape, and Kick questionnaire    Fear of Current or Ex-Partner: No    Emotionally Abused: No    Physically Abused: No    Sexually Abused: No    Family History  Problem Relation Age of Onset   Hypertension Father    Diabetes Father    Heart disease Father    Blindness Father    Alcohol abuse Sister    Drug abuse Sister    Anxiety disorder Sister    Depression Sister    Obesity Brother    Arthritis Brother    Alcohol abuse Brother    Drug abuse Brother    Depression Brother    Breast cancer Cousin    Clotting disorder Neg Hx      Current Outpatient Medications:    albuterol (VENTOLIN HFA) 108 (90 Base) MCG/ACT inhaler, Inhale into the lungs., Disp: , Rfl:    alendronate (FOSAMAX) 70 MG tablet, Take 70 mg by mouth once a week., Disp: , Rfl:    amLODipine (NORVASC) 10 MG  tablet, Take 10 mg by mouth daily., Disp: , Rfl:    ARIPiprazole (ABILIFY) 20 MG tablet, Take 1 tablet (20 mg total) by mouth daily., Disp: 90 tablet, Rfl: 0   atorvastatin (LIPITOR) 20 MG tablet, Take 20 mg by mouth at bedtime., Disp: , Rfl:    B Complex-C (B-COMPLEX WITH VITAMIN C) tablet, Take 1 tablet by mouth daily., Disp: , Rfl:    buPROPion (WELLBUTRIN XL) 300 MG 24 hr tablet, Take 1 tablet (300 mg total) by mouth daily., Disp: 90 tablet, Rfl: 0   calcium-vitamin D (OSCAL WITH D) 500-200 MG-UNIT tablet, Take 1 tablet by mouth daily with breakfast., Disp: , Rfl:    furosemide (LASIX) 20 MG tablet, Take 20 mg by mouth daily. , Disp: , Rfl:    hydrOXYzine (ATARAX) 10 MG tablet, Take 2 tablets (20 mg total) by mouth at bedtime as needed. for sleep and anxiety, Disp: 180 tablet, Rfl: 0   levothyroxine (SYNTHROID, LEVOTHROID) 112 MCG tablet, , Disp: , Rfl:    meloxicam (MOBIC) 15 MG tablet, Take 1 tablet by mouth daily., Disp: , Rfl:    nortriptyline (PAMELOR) 50 MG capsule, Take 1 capsule (50 mg total) by mouth at bedtime., Disp: 90  capsule, Rfl: 0   omeprazole (PRILOSEC) 40 MG capsule, Take 40 mg by mouth daily. , Disp: , Rfl:    prazosin (MINIPRESS) 2 MG capsule, Take 1 capsule (2 mg total) by mouth at bedtime., Disp: 90 capsule, Rfl: 1   PREVIDENT 5000 DRY MOUTH 1.1 % GEL dental gel, Place onto teeth., Disp: , Rfl:    SPIRIVA HANDIHALER 18 MCG inhalation capsule, , Disp: , Rfl:    valACYclovir (VALTREX) 1000 MG tablet, , Disp: , Rfl:   Physical exam:  Vitals:   04/07/22 1005  BP: 131/75  Pulse: 91  Resp: 16  Temp: (!) 95.5 F (35.3 C)  TempSrc: Tympanic  SpO2: 99%  Weight: 210 lb (95.3 kg)   Physical Exam Cardiovascular:     Rate and Rhythm: Normal rate and regular rhythm.     Heart sounds: Normal heart sounds.  Pulmonary:     Effort: Pulmonary effort is normal.     Breath sounds: Normal breath sounds.  Skin:    General: Skin is warm and dry.  Neurological:     Mental Status: She is alert and oriented to person, place, and time.         Latest Ref Rng & Units 06/12/2019   11:41 PM  CMP  Glucose 70 - 99 mg/dL 102   BUN 8 - 23 mg/dL 14   Creatinine 0.44 - 1.00 mg/dL 1.31   Sodium 135 - 145 mmol/L 136   Potassium 3.5 - 5.1 mmol/L 3.3   Chloride 98 - 111 mmol/L 99   CO2 22 - 32 mmol/L 26   Calcium 8.9 - 10.3 mg/dL 9.0   Total Protein 6.5 - 8.1 g/dL 7.9   Total Bilirubin 0.3 - 1.2 mg/dL 0.6   Alkaline Phos 38 - 126 U/L 149   AST 15 - 41 U/L 32   ALT 0 - 44 U/L 41       Latest Ref Rng & Units 06/12/2019   11:41 PM  CBC  WBC 4.0 - 10.5 K/uL 11.1   Hemoglobin 12.0 - 15.0 g/dL 13.1   Hematocrit 36.0 - 46.0 % 37.7   Platelets 150 - 400 K/uL 247     No images are attached to the encounter.  MM Breast Surgical Specimen  Result Date: 03/20/2022 CLINICAL DATA:  Status post Magseed localized left breast lumpectomy. EXAM: SPECIMEN RADIOGRAPH OF THE left BREAST COMPARISON:  Previous exam(s). FINDINGS: Status post excision of the left breast. The Magseed and ribbon shaped clip are present within  the specimen. IMPRESSION: Specimen radiograph of the left breast. Electronically Signed   By: Abelardo Diesel M.D.   On: 03/20/2022 15:40  NM Sentinel Node Inj-No Rpt (Breast)  Result Date: 03/20/2022 Sulfur Colloid was injected by the Nuclear Medicine Technologist for sentinel lymph node localization.   MM LT RADIO FREQUENCY TAG LOC MAMMO GUIDE  Result Date: 03/16/2022 CLINICAL DATA:  Pre lumpectomy localization of a recently biopsied invasive mammary carcinoma in the upper, slightly inner left breast, marked with a ribbon shaped biopsy marking clip. EXAM: NEEDLE LOCALIZATION OF THE LEFT BREAST WITH MAMMO GUIDANCE COMPARISON:  Previous exam(s). FINDINGS: Patient presents for needle localization prior to left lumpectomy. I met with the patient and we discussed the procedure of needle localization including benefits and alternatives. We discussed the high likelihood of a successful procedure. We discussed the risks of the procedure, including infection, bleeding, tissue injury, and further surgery. Informed, written consent was given. The usual time-out protocol was performed immediately prior to the procedure. Using mammographic guidance, sterile technique, 1% lidocaine and a 7 cm Magseed needle, the recently placed ribbon shaped biopsy marker clip in the upper, slightly inner left breast was localized using a cephalad approach. The images were marked for Cintron-Diaz. IMPRESSION: Magseed localization of the left breast. No apparent complications. Electronically Signed   By: Claudie Revering M.D.   On: 03/16/2022 16:32    Assessment and plan- Patient is a 62 y.o. female with pathological prognostic stage Ia invasive mammary carcinoma of the left breast pT1a N0 M0 ER/PR positive HER2 negative here to discuss final pathology results  Discussed results of final pathology which showed a 4 mm grade 3 tumor with negative margins.  ER/PR strongly positive and HER2 negative.  I am sending an Oncotype testing on the  specimen even though her tumor was less than 5 mm given that it was grade 3 and 4 mm.  I am awaiting those results.  Patient is already met with radiation oncology and will be starting partial breast radiation next week which will go on for 2 weeks.  If patient falls in the high risk group and requires adjuvant chemotherapy and plan to do that after she completes radiation.  Given that her tumor was ER PR positive hormone therapy is indicated at this time.  Idiscussed the role for hormone therapy. Given that she is postmenopausal I would favor 5 years of adjuvant hormone therapy with aromatase inhibitor. I discussed the risks and benefits of Arimidex including all but not limited to fatigue, hypercholesterolemia, hot flashes, arthralgias and worsening bone health.  Patient will also need to be on calcium 1200 mg along with vitamin D 800 international units.  We will obtain a baseline bone density scan.  Patient does have some baseline osteoporosis but given that she is on weekly Fosamax I am okay with her starting aromatase inhibitor while we monitor her bone density scan closely.  I will wait on Oncotype testing before I sent with a prescription for Arimidex.  I would like her to finish radiation therapy and start hormone therapy thereafter. Patient verbalized understanding and agrees to proceed.  Treatment will be given with a curative intent   Cancer Staging  Malignant neoplasm of upper-inner quadrant of left breast in female,  estrogen receptor positive ([place]) Staging form: Breast, AJCC 8th Edition - Clinical stage from 03/10/2022: Stage IA (cT1a, cN0, cM0, G2, ER+, PR+, HER2-) - Signed by Sindy Guadeloupe, MD on 03/10/2022 Histologic grading system: 3 grade system - Pathologic stage from 04/07/2022: Stage IA (pT1a, pN0, cM0, G3, ER+, PR+, HER2-) - Signed by Sindy Guadeloupe, MD on 04/07/2022 Stage prefix: Initial diagnosis Multigene prognostic tests performed: Oncotype DX Histologic grading system: 3 grade  system     Visit Diagnosis 1. Malignant neoplasm of upper-inner quadrant of left breast in female, estrogen receptor positive ([place])   2. Goals of care, counseling/discussion      Dr. Randa Evens, MD, MPH [facility] [phone] 04/07/2022 10:52 AM

## 2022-04-07 NOTE — Consult Note (Signed)
NEW PATIENT EVALUATION  Name: Patricia Good  MRN: [id number]  Date:   04/07/2022     DOB: 11-Dec-1960   This 62 y.o. female patient presents to the clinic for initial evaluation of stage Ia (T1a N0 M0) ER/PR positive.  Invasive mammary carcinoma of the left breast status post wide local excision and sentinel node biopsy  REFERRING PHYSICIAN: Entzminger, Telford Nab A,*  CHIEF COMPLAINT: No chief complaint on file.   DIAGNOSIS: The encounter diagnosis was Malignant neoplasm of upper-inner quadrant of left breast in female, estrogen receptor positive ([place]).   PREVIOUS INVESTIGATIONS:  Mammogram and ultrasound reviewed Pathology reports reviewed Clinical notes reviewed  HPI: Patient is a 62 year old female who presents with an abnormal mammogram of her left breast showing a 3 mm group of calcifications in the upper left breast slightly inner quadrant.  She underwent stereotactic guided biopsy which was positive for invasive mammary carcinoma.  She underwent a wide local excision showing a 4 mm single focus of invasive mammary carcinoma overall grade 3.  Margins were clear at 10 mm for the DCIS component and 7 mm for the invasive component.  3 sentinel lymph nodes were negative for metastatic disease.  Tumor was ER/PR positive HER2/neu not overexpressed.  She is seen today for radiation oncology opinion.  She is doing well specifically denies breast tenderness cough or bone pain.  She is seeing Dr. Janese Banks today for consideration of systemic treatment. PLANNED TREATMENT REGIMEN: Accelerated partial breast irradiation  PAST MEDICAL HISTORY:  has a past medical history of Abnormal thyroid stimulating hormone (TSH) level, Anemia, Anxiety, Asthma, Bipolar 1 disorder ([place]), COPD (chronic obstructive pulmonary disease) ([place]), Endometriosis, GERD (gastroesophageal reflux disease), Hepatitis A, Hypertension, Hypothyroidism, Malignant neoplasm of upper-inner quadrant of left breast in female, estrogen receptor  positive ([place]) (03/2022), Pre-diabetes, PTSD (post-traumatic stress disorder), and Schizoaffective disorder ([place]).    PAST SURGICAL HISTORY:  Past Surgical History:  Procedure Laterality Date   BREAST BIOPSY Left 03/03/2022   Left breast stereo bx Ribbon Clip- path pending   BREAST BIOPSY Left 03/03/2022   MM LT BREAST BX W LOC DEV 1ST LESION IMAGE BX SPEC STEREO GUIDE 03/03/2022 [facility]   ECTOPIC PREGNANCY SURGERY     PART MASTECTOMY,RADIO FREQUENCY LOCALIZER,AXILLARY SENTINEL NODE BIOPSY Left 03/20/2022   Procedure: PART MASTECTOMY,RADIO FREQUENCY LOCALIZER,AXILLARY SENTINEL NODE BIOPSY;  Surgeon: Herbert Pun, MD;  Location: [facility] OR[facility];  Service: General;  Laterality: Left;   TOTAL ABDOMINAL HYSTERECTOMY W/ BILATERAL SALPINGOOPHORECTOMY  1990    FAMILY HISTORY: family history includes Alcohol abuse in her brother and sister; Anxiety disorder in her sister; Arthritis in her brother; Blindness in her father; Breast cancer in her cousin; Depression in her brother and sister; Diabetes in her father; Drug abuse in her brother and sister; Heart disease in her father; Hypertension in her father; Obesity in her brother.  SOCIAL HISTORY:  reports that she has been smoking cigarettes. She started smoking about 53 years ago. She has been smoking an average of .5 packs per day. She has never used smokeless tobacco. She reports current alcohol use of about 1.0 - 2.0 standard drink of alcohol per week. She reports that she does not use drugs.  ALLERGIES: Shellfish allergy, Sulfa antibiotics, Sulfasalazine, and Lisinopril  MEDICATIONS:  Current Outpatient Medications  Medication Sig Dispense Refill   albuterol (VENTOLIN HFA) 108 (90 Base) MCG/ACT inhaler Inhale into the lungs.     alendronate (FOSAMAX) 70 MG tablet Take 70 mg by mouth once a week.  amLODipine (NORVASC) 10 MG tablet Take 10 mg by mouth daily.     ARIPiprazole (ABILIFY) 20 MG tablet Take 1 tablet (20 mg total) by  mouth daily. 90 tablet 0   atorvastatin (LIPITOR) 20 MG tablet Take 20 mg by mouth at bedtime.     B Complex-C (B-COMPLEX WITH VITAMIN C) tablet Take 1 tablet by mouth daily.     buPROPion (WELLBUTRIN XL) 300 MG 24 hr tablet Take 1 tablet (300 mg total) by mouth daily. 90 tablet 0   calcium-vitamin D (OSCAL WITH D) 500-200 MG-UNIT tablet Take 1 tablet by mouth daily with breakfast.     furosemide (LASIX) 20 MG tablet Take 20 mg by mouth daily.      hydrOXYzine (ATARAX) 10 MG tablet Take 2 tablets (20 mg total) by mouth at bedtime as needed. for sleep and anxiety 180 tablet 0   levothyroxine (SYNTHROID, LEVOTHROID) 112 MCG tablet      meloxicam (MOBIC) 15 MG tablet Take 1 tablet by mouth daily.     nortriptyline (PAMELOR) 50 MG capsule Take 1 capsule (50 mg total) by mouth at bedtime. 90 capsule 0   omeprazole (PRILOSEC) 40 MG capsule Take 40 mg by mouth daily.      prazosin (MINIPRESS) 2 MG capsule Take 1 capsule (2 mg total) by mouth at bedtime. 90 capsule 1   PREVIDENT 5000 DRY MOUTH 1.1 % GEL dental gel Place onto teeth.     SPIRIVA HANDIHALER 18 MCG inhalation capsule      valACYclovir (VALTREX) 1000 MG tablet      No current facility-administered medications for this encounter.    ECOG PERFORMANCE STATUS:  0 - Asymptomatic  REVIEW OF SYSTEMS: Patient denies any weight loss, fatigue, weakness, fever, chills or night sweats. Patient denies any loss of vision, blurred vision. Patient denies any ringing  of the ears or hearing loss. No irregular heartbeat. Patient denies heart murmur or history of fainting. Patient denies any chest pain or pain radiating to her upper extremities. Patient denies any shortness of breath, difficulty breathing at night, cough or hemoptysis. Patient denies any swelling in the lower legs. Patient denies any nausea vomiting, vomiting of blood, or coffee ground material in the vomitus. Patient denies any stomach pain. Patient states has had normal bowel movements no  significant constipation or diarrhea. Patient denies any dysuria, hematuria or significant nocturia. Patient denies any problems walking, swelling in the joints or loss of balance. Patient denies any skin changes, loss of hair or loss of weight. Patient denies any excessive worrying or anxiety or significant depression. Patient denies any problems with insomnia. Patient denies excessive thirst, polyuria, polydipsia. Patient denies any swollen glands, patient denies easy bruising or easy bleeding. Patient denies any recent infections, allergies or URI. Patient "s visual fields have not changed significantly in recent time.   PHYSICAL EXAM: BP 131/75   Pulse 91   Temp (!) 95.5 F (35.3 C)   Resp 16   Ht _0  (1.575 m)   Wt 210 lb (95.3 kg)   BMI 38.41 kg/m  She status post wide local excision of the left breast.  Area is well-healed there is some fibrotic changes secondary to surgery in the lumpectomy site.  No dominant masses noted in either breast.  No axillary or supraclavicular adenopathy is identified.  Well-developed well-nourished patient in NAD. HEENT reveals PERLA, EOMI, discs not visualized.  Oral cavity is clear. No oral mucosal lesions are identified. Neck is clear without evidence of  cervical or supraclavicular adenopathy. Lungs are clear to A&P. Cardiac examination is essentially unremarkable with regular rate and rhythm without murmur rub or thrill. Abdomen is benign with no organomegaly or masses noted. Motor sensory and DTR levels are equal and symmetric in the upper and lower extremities. Cranial nerves II through XII are grossly intact. Proprioception is intact. No peripheral adenopathy or edema is identified. No motor or sensory levels are noted. Crude visual fields are within normal range.  LABORATORY DATA: Pathology reports reviewed    RADIOLOGY RESULTS: Mammogram and ultrasound reviewed compatible with above-stated findings   IMPRESSION: Stage Ia ER/PR positive invasive  mammary carcinoma the left breast status post wide local excision in 62 year old female  PLAN: At this time I have recommended accelerated partial breast irradiation.  Will plan delivering 35 Gray in 10 fractions.  We use IMRT treatment planning and delivery to spare critical structures such as lung heart contralateral breast.  Risks and benefits of treatment including possible skin reaction fatigue alteration of blood counts all were reviewed in detail with the patient.  I have personally 7 ordered CT simulation early next week.  I would like to take this opportunity to thank you for allowing me to participate in the care of your patient.Noreene Filbert, MD

## 2022-04-07 NOTE — Addendum Note (Signed)
Addended by: Rico Junker on: 04/07/2022 12:02 PM   Modules accepted: Orders

## 2022-04-08 ENCOUNTER — Ambulatory Visit: Payer: Medicare Other | Admitting: Psychiatry

## 2022-04-09 ENCOUNTER — Encounter: Payer: Self-pay | Admitting: *Deleted

## 2022-04-09 ENCOUNTER — Encounter: Payer: Self-pay | Admitting: Oncology

## 2022-04-09 NOTE — Progress Notes (Signed)
Called patient to let her know that her oncotype score was high and Dr. Janese Banks would like to discuss chemotherapy with her.   She is scheduled to see Dr. Janese Banks on Monday at 13 right after her CT sim appointment.

## 2022-04-13 ENCOUNTER — Inpatient Hospital Stay (HOSPITAL_BASED_OUTPATIENT_CLINIC_OR_DEPARTMENT_OTHER): Payer: Medicare HMO | Admitting: Oncology

## 2022-04-13 ENCOUNTER — Telehealth: Payer: Self-pay | Admitting: *Deleted

## 2022-04-13 ENCOUNTER — Encounter: Payer: Self-pay | Admitting: Oncology

## 2022-04-13 ENCOUNTER — Ambulatory Visit
Admission: RE | Admit: 2022-04-13 | Discharge: 2022-04-13 | Disposition: A | Discharge status: Home / Self Care | Payer: Medicare HMO | Source: Ambulatory Visit | Attending: Radiation Oncology | Admitting: Radiation Oncology

## 2022-04-13 ENCOUNTER — Inpatient Hospital Stay: Payer: Medicare HMO

## 2022-04-13 VITALS — BP 130/62 | HR 85 | Temp 96.8°F | Resp 17 | Wt 207.0 lb

## 2022-04-13 DIAGNOSIS — Z170 Estrogen receptor positive status [ER+]: Secondary | ICD-10-CM

## 2022-04-13 DIAGNOSIS — C50212 Malignant neoplasm of upper-inner quadrant of left female breast: Secondary | ICD-10-CM | POA: Diagnosis not present

## 2022-04-13 DIAGNOSIS — Z7189 Other specified counseling: Secondary | ICD-10-CM | POA: Diagnosis not present

## 2022-04-13 DIAGNOSIS — Z510 Encounter for antineoplastic radiation therapy: Secondary | ICD-10-CM | POA: Diagnosis not present

## 2022-04-13 NOTE — Telephone Encounter (Signed)
Patient called reporting that she has decided to take the chemotherapy.

## 2022-04-13 NOTE — Progress Notes (Signed)
Hematology/Oncology Consult note [facility]  [phone] [phone]  Patient Care Team: Entzminger, Mendel Corning, MD as PCP - General (Internal Medicine) Daiva Huge, RN as Oncology Nurse Navigator   Name of the patient: Patricia Good  [id number]  [birth year]   Date of visit: 04/13/22  Diagnosis- pathological prognostic stage Ib invasive mammary carcinoma of the left breast pT1a N0 M0 ER/PR positive HER2 negative   Chief complaint/ Reason for visit-  discuss Oncotype results and further management  Heme/Onc history: Patient is a 62 year old female who underwent a routine screening bilateral mammogram in October 2023 which showed possible calcifications in the left breast. This was followed by a diagnostic mammogram which showed a 3 mm group of calcifications in the upper inner quadrant of the left breast and a biopsy was recommended. Core biopsy showed invasive mammary carcinoma 2 mm grade 2 ER greater than 90% positive PR greater than 90% positive and HER2 negative.    Patient has baseline osteoporosis and is currently on weekly Fosamax  Final pathology showed 4 mm grade 3 tumor with negative margins.  Given the high negative tumor Oncotype testing was sent and came back at high risk with a recurrence score of 28.  Interval history-no new complaints as compared to last visit.  ECOG PS- 1 Pain scale- 0 Opioid associated constipation- no  Review of systems- Review of Systems  Constitutional:  Negative for chills, fever, malaise/fatigue and weight loss.  HENT:  Negative for congestion, ear discharge and nosebleeds.   Eyes:  Negative for blurred vision.  Respiratory:  Negative for cough, hemoptysis, sputum production, shortness of breath and wheezing.   Cardiovascular:  Negative for chest pain, palpitations, orthopnea and claudication.  Gastrointestinal:  Negative for abdominal pain, blood in stool, constipation, diarrhea, heartburn,  melena, nausea and vomiting.  Genitourinary:  Negative for dysuria, flank pain, frequency, hematuria and urgency.  Musculoskeletal:  Negative for back pain, joint pain and myalgias.  Skin:  Negative for rash.  Neurological:  Negative for dizziness, tingling, focal weakness, seizures, weakness and headaches.  Endo/Heme/Allergies:  Does not bruise/bleed easily.  Psychiatric/Behavioral:  Negative for depression and suicidal ideas. The patient does not have insomnia.       Allergies  Allergen Reactions   Shellfish Allergy Anaphylaxis    Only to crab meat (non-imitation), not shrimp   Sulfa Antibiotics Hives, Anaphylaxis and Other (See Comments)   Sulfasalazine Anaphylaxis and Hives    Other reaction(s): Other (See Comments)   Lisinopril     unknown     Past Medical History:  Diagnosis Date   Abnormal thyroid stimulating hormone (TSH) level    Anemia    Anxiety    Asthma    Bipolar 1 disorder (HCC)    COPD (chronic obstructive pulmonary disease) (HCC)    Endometriosis    GERD (gastroesophageal reflux disease)    Hepatitis A    Hypertension    Hypothyroidism    Malignant neoplasm of upper-inner quadrant of left breast in female, estrogen receptor positive ([place]) 03/2022   Pre-diabetes    PTSD (post-traumatic stress disorder)    Schizoaffective disorder [facility])      Past Surgical History:  Procedure Laterality Date   BREAST BIOPSY Left 03/03/2022   Left breast stereo bx Ribbon Clip- path pending   BREAST BIOPSY Left 03/03/2022   MM LT BREAST BX W LOC DEV 1ST LESION IMAGE BX SPEC STEREO GUIDE 03/03/2022 [facility]   ECTOPIC PREGNANCY SURGERY  PART MASTECTOMY,RADIO FREQUENCY LOCALIZER,AXILLARY SENTINEL NODE BIOPSY Left 03/20/2022   Procedure: PART MASTECTOMY,RADIO FREQUENCY LOCALIZER,AXILLARY SENTINEL NODE BIOPSY;  Surgeon: Herbert Pun, MD;  Location: [facility] ORS;  Service: General;  Laterality: Left;   TOTAL ABDOMINAL HYSTERECTOMY W/ BILATERAL  SALPINGOOPHORECTOMY  1990    Social History   Socioeconomic History   Marital status: Single    Spouse name: Not on file   Number of children: 0   Years of education: Not on file   Highest education level: Associate degree: occupational, Hotel manager, or vocational program  Occupational History   Not on file  Tobacco Use   Smoking status: Every Day    Packs/day: 0.50    Types: Cigarettes    Start date: 01/22/1969   Smokeless tobacco: Never   Tobacco comments:    7-10 cigarettes a day reported on 03/10/2022.   Vaping Use   Vaping Use: Never used  Substance and Sexual Activity   Alcohol use: Yes    Alcohol/week: 1.0 - 2.0 standard drink of alcohol    Types: 1 Glasses of wine per week    Comment: occasional   Drug use: No   Sexual activity: Not Currently  Other Topics Concern   Not on file  Social History Narrative   Lives alone   Social Determinants of Health   Financial Resource Strain: Medium Risk (06/15/2017)   Overall Financial Resource Strain (CARDIA)    Difficulty of Paying Living Expenses: Somewhat hard  Food Insecurity: Food Insecurity Present (06/15/2017)   Hunger Vital Sign    Worried About Running Out of Food in the Last Year: Sometimes true    Ran Out of Food in the Last Year: Sometimes true  Transportation Needs: No Transportation Needs (06/15/2017)   PRAPARE - Hydrologist (Medical): No    Lack of Transportation (Non-Medical): No  Physical Activity: Inactive (06/15/2017)   Exercise Vital Sign    Days of Exercise per Week: 0 days    Minutes of Exercise per Session: 0 min  Stress: Stress Concern Present (06/15/2017)   [place]    Feeling of Stress : Very much  Social Connections: Socially Isolated (06/15/2017)   Social Connection and Isolation Panel [NHANES]    Frequency of Communication with Friends and Family: Never    Frequency of Social Gatherings with Friends  and Family: Never    Attends Religious Services: Never    Marine scientist or Organizations: No    Attends Archivist Meetings: Never    Marital Status: Divorced  Human resources officer Violence: Not At Risk (06/15/2017)   Humiliation, Afraid, Rape, and Kick questionnaire    Fear of Current or Ex-Partner: No    Emotionally Abused: No    Physically Abused: No    Sexually Abused: No    Family History  Problem Relation Age of Onset   Hypertension Father    Diabetes Father    Heart disease Father    Blindness Father    Alcohol abuse Sister    Drug abuse Sister    Anxiety disorder Sister    Depression Sister    Obesity Brother    Arthritis Brother    Alcohol abuse Brother    Drug abuse Brother    Depression Brother    Breast cancer Cousin    Clotting disorder Neg Hx      Current Outpatient Medications:    albuterol (VENTOLIN HFA) 108 (90 Base) MCG/ACT inhaler,  Inhale into the lungs., Disp: , Rfl:    alendronate (FOSAMAX) 70 MG tablet, Take 70 mg by mouth once a week., Disp: , Rfl:    amLODipine (NORVASC) 10 MG tablet, Take 10 mg by mouth daily., Disp: , Rfl:    ARIPiprazole (ABILIFY) 20 MG tablet, Take 1 tablet (20 mg total) by mouth daily., Disp: 90 tablet, Rfl: 0   atorvastatin (LIPITOR) 20 MG tablet, Take 20 mg by mouth at bedtime., Disp: , Rfl:    B Complex-C (B-COMPLEX WITH VITAMIN C) tablet, Take 1 tablet by mouth daily., Disp: , Rfl:    buPROPion (WELLBUTRIN XL) 300 MG 24 hr tablet, Take 1 tablet (300 mg total) by mouth daily., Disp: 90 tablet, Rfl: 0   calcium-vitamin D (OSCAL WITH D) 500-200 MG-UNIT tablet, Take 1 tablet by mouth daily with breakfast., Disp: , Rfl:    furosemide (LASIX) 20 MG tablet, Take 20 mg by mouth daily. , Disp: , Rfl:    hydrOXYzine (ATARAX) 10 MG tablet, Take 2 tablets (20 mg total) by mouth at bedtime as needed. for sleep and anxiety, Disp: 180 tablet, Rfl: 0   levothyroxine (SYNTHROID, LEVOTHROID) 112 MCG tablet, , Disp: , Rfl:     meloxicam (MOBIC) 15 MG tablet, Take 1 tablet by mouth daily., Disp: , Rfl:    nortriptyline (PAMELOR) 50 MG capsule, Take 1 capsule (50 mg total) by mouth at bedtime., Disp: 90 capsule, Rfl: 0   omeprazole (PRILOSEC) 40 MG capsule, Take 40 mg by mouth daily. , Disp: , Rfl:    prazosin (MINIPRESS) 2 MG capsule, Take 1 capsule (2 mg total) by mouth at bedtime., Disp: 90 capsule, Rfl: 1   PREVIDENT 5000 DRY MOUTH 1.1 % GEL dental gel, Place onto teeth., Disp: , Rfl:    SPIRIVA HANDIHALER 18 MCG inhalation capsule, , Disp: , Rfl:    valACYclovir (VALTREX) 1000 MG tablet, , Disp: , Rfl:   Physical exam:  Vitals:   04/13/22 0930  BP: 130/62  Pulse: 85  Resp: 17  Temp: (!) 96.8 F (36 C)  SpO2: 96%  Weight: 207 lb (93.9 kg)   Physical Exam Cardiovascular:     Rate and Rhythm: Normal rate and regular rhythm.     Heart sounds: Normal heart sounds.  Pulmonary:     Effort: Pulmonary effort is normal.     Breath sounds: Normal breath sounds.  Abdominal:     General: Bowel sounds are normal.     Palpations: Abdomen is soft.  Skin:    General: Skin is warm and dry.  Neurological:     Mental Status: She is alert and oriented to person, place, and time.         Latest Ref Rng & Units 06/12/2019   11:41 PM  CMP  Glucose 70 - 99 mg/dL 102   BUN 8 - 23 mg/dL 14   Creatinine 0.44 - 1.00 mg/dL 1.31   Sodium 135 - 145 mmol/L 136   Potassium 3.5 - 5.1 mmol/L 3.3   Chloride 98 - 111 mmol/L 99   CO2 22 - 32 mmol/L 26   Calcium 8.9 - 10.3 mg/dL 9.0   Total Protein 6.5 - 8.1 g/dL 7.9   Total Bilirubin 0.3 - 1.2 mg/dL 0.6   Alkaline Phos 38 - 126 U/L 149   AST 15 - 41 U/L 32   ALT 0 - 44 U/L 41       Latest Ref Rng & Units 06/12/2019   11:41 PM  CBC  WBC 4.0 - 10.5 K/uL 11.1   Hemoglobin 12.0 - 15.0 g/dL 13.1   Hematocrit 36.0 - 46.0 % 37.7   Platelets 150 - 400 K/uL 247     No images are attached to the encounter.  MM Breast Surgical Specimen  Result Date: 03/20/2022 CLINICAL  DATA:  Status post Magseed localized left breast lumpectomy. EXAM: SPECIMEN RADIOGRAPH OF THE left BREAST COMPARISON:  Previous exam(s). FINDINGS: Status post excision of the left breast. The Magseed and ribbon shaped clip are present within the specimen. IMPRESSION: Specimen radiograph of the left breast. Electronically Signed   By: Abelardo Diesel M.D.   On: 03/20/2022 15:40  NM Sentinel Node Inj-No Rpt (Breast)  Result Date: 03/20/2022 Sulfur Colloid was injected by the Nuclear Medicine Technologist for sentinel lymph node localization.   MM LT RADIO FREQUENCY TAG LOC MAMMO GUIDE  Result Date: 03/16/2022 CLINICAL DATA:  Pre lumpectomy localization of a recently biopsied invasive mammary carcinoma in the upper, slightly inner left breast, marked with a ribbon shaped biopsy marking clip. EXAM: NEEDLE LOCALIZATION OF THE LEFT BREAST WITH MAMMO GUIDANCE COMPARISON:  Previous exam(s). FINDINGS: Patient presents for needle localization prior to left lumpectomy. I met with the patient and we discussed the procedure of needle localization including benefits and alternatives. We discussed the high likelihood of a successful procedure. We discussed the risks of the procedure, including infection, bleeding, tissue injury, and further surgery. Informed, written consent was given. The usual time-out protocol was performed immediately prior to the procedure. Using mammographic guidance, sterile technique, 1% lidocaine and a 7 cm Magseed needle, the recently placed ribbon shaped biopsy marker clip in the upper, slightly inner left breast was localized using a cephalad approach. The images were marked for Cintron-Diaz. IMPRESSION: Magseed localization of the left breast. No apparent complications. Electronically Signed   By: Claudie Revering M.D.   On: 03/16/2022 16:32    Assessment and plan- Patient is a 62 y.o. female with pathological prognostic stage Ia invasive mammary carcinoma of the left breast pT1a N0 M0 ER/PR  positive HER2 negative.  She is here to discuss Oncotype results and further management  Oncotype testing came back with a high risk recurrence score of 28.  17% risk of recurrence with endocrine therapy alone at 9 years.  Greater than 15% chemotherapy benefit.  Based on the Oncotype risk score it would be reasonable to consider adjuvant chemotherapy with Taxotere and Cytoxan given IV every 3 weeks for 4 cycles.  Discussed risks and benefits of chemotherapy including all but not limited to nausea, vomiting, low blood counts, risk of infections and hospitalization.  Risk of hair loss as well as peripheral neuropathy associated with Taxotere.  Patient would like to think about her options before deciding if she wants to proceed with chemotherapy or not.  Patient is presently going through partial breast radiation and will be completing her treatment end of January when I will see her thereafter to see if she would be interested in doing chemotherapy.  Regardless of the decision for chemotherapy patient would also benefit from endocrine therapy given that she had ER positive disease.  Patient does have baseline osteoporosis and is on weekly Fosamax.  We could consider starting her on aromatase inhibitor and monitoring her bone density scan closely.  I will discuss endocrine therapy in more detail when I see her at my next visit.  Treatment will be given with a curative intent   Cancer Staging  Malignant neoplasm of upper-inner  quadrant of left breast in female, estrogen receptor positive ([place]) Staging form: Breast, AJCC 8th Edition - Clinical stage from 03/10/2022: Stage IA (cT1a, cN0, cM0, G2, ER+, PR+, HER2-) - Signed by Sindy Guadeloupe, MD on 03/10/2022 Histologic grading system: 3 grade system - Pathologic stage from 04/07/2022: Stage IA (pT1a, pN0, cM0, G3, ER+, PR+, HER2-, Oncotype DX score: 28) - Signed by Sindy Guadeloupe, MD on 04/13/2022 Stage prefix: Initial diagnosis Multigene prognostic tests  performed: Oncotype DX Recurrence score range: Greater than or equal to 11 Histologic grading system: 3 grade system     Visit Diagnosis 1. Goals of care, counseling/discussion   2. Malignant neoplasm of upper-inner quadrant of left breast in female, estrogen receptor positive ([place])      Dr. Randa Evens, MD, MPH [facility] [phone] 04/13/2022 1:04 PM

## 2022-04-13 NOTE — Progress Notes (Signed)
Patient here for oncology follow-up appointment, expresses no complaints or concerns at this time.    

## 2022-04-13 NOTE — Telephone Encounter (Signed)
Keep follow-up with me end of January as scheduled.  I will put in treatment plan for Taxotere and Cytoxan chemotherapy starting first week of February.  Patient states that she did not want a port but please double check with her.  She will need chemo teach for TC chemotherapy

## 2022-04-14 ENCOUNTER — Encounter: Payer: Medicare Other | Admitting: Licensed Clinical Social Worker

## 2022-04-14 ENCOUNTER — Other Ambulatory Visit: Payer: Medicare Other

## 2022-04-15 ENCOUNTER — Other Ambulatory Visit: Payer: Self-pay | Admitting: Psychiatry

## 2022-04-15 ENCOUNTER — Telehealth: Payer: Self-pay | Admitting: Psychiatry

## 2022-04-15 DIAGNOSIS — F250 Schizoaffective disorder, bipolar type: Secondary | ICD-10-CM

## 2022-04-15 DIAGNOSIS — F4310 Post-traumatic stress disorder, unspecified: Secondary | ICD-10-CM

## 2022-04-15 MED ORDER — ARIPIPRAZOLE 20 MG PO TABS: 20.0000 mg | ORAL_TABLET | Freq: Every day | ORAL | 0 refills | Status: DC

## 2022-04-15 NOTE — Telephone Encounter (Signed)
Pt notified by phone.

## 2022-04-15 NOTE — Telephone Encounter (Signed)
Patient called requesting refill on Abilify 20 mg please send to [facility]. Thank you

## 2022-04-15 NOTE — Telephone Encounter (Signed)
I have sent Katie.

## 2022-04-16 DIAGNOSIS — Z510 Encounter for antineoplastic radiation therapy: Secondary | ICD-10-CM | POA: Diagnosis not present

## 2022-04-17 ENCOUNTER — Other Ambulatory Visit: Payer: Self-pay | Admitting: *Deleted

## 2022-04-17 DIAGNOSIS — Z170 Estrogen receptor positive status [ER+]: Secondary | ICD-10-CM

## 2022-04-21 ENCOUNTER — Other Ambulatory Visit: Payer: Self-pay | Admitting: Psychiatry

## 2022-04-21 ENCOUNTER — Telehealth: Payer: Self-pay | Admitting: *Deleted

## 2022-04-21 ENCOUNTER — Ambulatory Visit: Admission: RE | Admit: 2022-04-21 | Payer: Medicare HMO | Source: Ambulatory Visit

## 2022-04-21 DIAGNOSIS — F250 Schizoaffective disorder, bipolar type: Secondary | ICD-10-CM

## 2022-04-21 DIAGNOSIS — F4310 Post-traumatic stress disorder, unspecified: Secondary | ICD-10-CM

## 2022-04-21 NOTE — Telephone Encounter (Signed)
I called the pt. Today and per pt she said she is only get ing 4  treatments and feels like she can make it through with out a port. She states if she was going to have 10 to 12 treatments she might would have one then .

## 2022-04-22 ENCOUNTER — Other Ambulatory Visit: Payer: Self-pay

## 2022-04-22 ENCOUNTER — Ambulatory Visit
Admission: RE | Admit: 2022-04-22 | Discharge: 2022-04-22 | Disposition: A | Discharge status: Home / Self Care | Payer: Medicare HMO | Source: Ambulatory Visit | Attending: Radiation Oncology | Admitting: Radiation Oncology

## 2022-04-22 DIAGNOSIS — Z510 Encounter for antineoplastic radiation therapy: Secondary | ICD-10-CM | POA: Diagnosis not present

## 2022-04-22 LAB — RAD ONC ARIA SESSION SUMMARY
Course Elapsed Days: 0
Plan Fractions Treated to Date: 1
Plan Prescribed Dose Per Fraction: 3.5 Gy
Plan Total Fractions Prescribed: 10
Plan Total Prescribed Dose: 35 Gy
Reference Point Dosage Given to Date: 3.5 Gy
Reference Point Session Dosage Given: 3.5 Gy
Session Number: 1

## 2022-04-23 ENCOUNTER — Ambulatory Visit
Admission: RE | Admit: 2022-04-23 | Discharge: 2022-04-23 | Disposition: A | Discharge status: Home / Self Care | Payer: Medicare HMO | Source: Ambulatory Visit | Attending: Radiation Oncology | Admitting: Radiation Oncology

## 2022-04-23 ENCOUNTER — Other Ambulatory Visit: Payer: Self-pay

## 2022-04-23 DIAGNOSIS — Z510 Encounter for antineoplastic radiation therapy: Secondary | ICD-10-CM | POA: Diagnosis not present

## 2022-04-23 LAB — RAD ONC ARIA SESSION SUMMARY
Course Elapsed Days: 1
Plan Fractions Treated to Date: 2
Plan Prescribed Dose Per Fraction: 3.5 Gy
Plan Total Fractions Prescribed: 10
Plan Total Prescribed Dose: 35 Gy
Reference Point Dosage Given to Date: 7 Gy
Reference Point Session Dosage Given: 3.5 Gy
Session Number: 2

## 2022-04-24 ENCOUNTER — Ambulatory Visit: Payer: Medicare HMO

## 2022-04-27 ENCOUNTER — Inpatient Hospital Stay: Payer: Medicare HMO

## 2022-04-27 ENCOUNTER — Ambulatory Visit: Payer: Medicare HMO

## 2022-04-28 ENCOUNTER — Encounter: Payer: Self-pay | Admitting: *Deleted

## 2022-04-28 ENCOUNTER — Ambulatory Visit: Payer: Medicare HMO

## 2022-04-28 NOTE — Progress Notes (Unsigned)
Ms. Thien called and left a voicemail that she does not want any more treatment. She said the radiation burned her and she doesn't want chemo either. I returned her call to discuss further. She is adamant. She is not willing to try taking antihormone pill either. I explained to her that Dr. Janese Banks and Dr. Baruch Gouty would still want to see her and follow her but she doesn't want to come back in and will just follow up with her PCP. Dr. Baruch Gouty and Dr. Janese Banks notified of above.   Dr. Janese Banks would like to see her back in 2 months for follow up.

## 2022-04-29 ENCOUNTER — Encounter: Payer: Self-pay | Admitting: *Deleted

## 2022-04-29 ENCOUNTER — Ambulatory Visit: Payer: Medicare HMO

## 2022-04-29 NOTE — Progress Notes (Signed)
Spoke to Patricia Good and she is willing to come in for follow up with Dr. Janese Banks in 2 months.   Appt. Made and AVS mailed to her.

## 2022-04-30 ENCOUNTER — Ambulatory Visit: Payer: Medicare HMO

## 2022-05-01 ENCOUNTER — Ambulatory Visit: Payer: Medicare HMO

## 2022-05-04 ENCOUNTER — Ambulatory Visit: Payer: Medicare HMO

## 2022-05-05 ENCOUNTER — Ambulatory Visit: Payer: Medicare HMO

## 2022-05-05 ENCOUNTER — Inpatient Hospital Stay: Payer: Medicare HMO | Admitting: Oncology

## 2022-05-06 ENCOUNTER — Encounter: Payer: Self-pay | Admitting: Psychiatry

## 2022-05-06 ENCOUNTER — Ambulatory Visit (INDEPENDENT_AMBULATORY_CARE_PROVIDER_SITE_OTHER): Payer: Medicare HMO | Admitting: Psychiatry

## 2022-05-06 VITALS — BP 136/75 | HR 80 | Temp 97.8°F | Ht 61.0 in | Wt 206.0 lb

## 2022-05-06 DIAGNOSIS — F4010 Social phobia, unspecified: Secondary | ICD-10-CM | POA: Diagnosis not present

## 2022-05-06 DIAGNOSIS — F4310 Post-traumatic stress disorder, unspecified: Secondary | ICD-10-CM

## 2022-05-06 DIAGNOSIS — F5105 Insomnia due to other mental disorder: Secondary | ICD-10-CM | POA: Diagnosis not present

## 2022-05-06 DIAGNOSIS — F250 Schizoaffective disorder, bipolar type: Secondary | ICD-10-CM | POA: Diagnosis not present

## 2022-05-06 DIAGNOSIS — F17200 Nicotine dependence, unspecified, uncomplicated: Secondary | ICD-10-CM

## 2022-05-06 MED ORDER — HYDROXYZINE HCL 10 MG PO TABS: 20.0000 mg | ORAL_TABLET | Freq: Every evening | ORAL | 0 refills | Status: DC | PRN

## 2022-05-06 MED ORDER — BUPROPION HCL ER (XL) 300 MG PO TB24: 300.0000 mg | ORAL_TABLET | Freq: Every day | ORAL | 0 refills | Status: DC

## 2022-05-06 MED ORDER — PRAZOSIN HCL 2 MG PO CAPS: 2.0000 mg | ORAL_CAPSULE | Freq: Every day | ORAL | 1 refills | Status: DC

## 2022-05-06 NOTE — Progress Notes (Unsigned)
[facility] [place] MD OP Progress Note  05/06/2022 5:31 PM Patricia Good  MRN:  [id number]  Chief Complaint:  Chief Complaint  Patient presents with   Follow-up   Depression   Anxiety   Medication Refill   HPI: Patricia Good is a 62 year old Caucasian female, has a history of schizoaffective disorder, social anxiety disorder, PTSD, COPD, vitamin B12 deficiency, psoriasis, history of malignant melanoma, hypothyroidism, osteoarthritis was evaluated in office today.  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  Patient today reports she has been feeling slightly down, withdrawn, low energy since the past few months.  She reports she had good support system from friends and family while she went through her treatment.  She reports she was advised to get chemotherapy however she did not want to go through with the treatment since she lost her trust in her providers after she had a possible side effect to one of the treatments.  She reports she has is not going to return for further treatment at this time.  She however does have follow-up appointment scheduled and is willing to follow-up for further discussion.  Patient reports she currently does not have any significant sleep problems, hallucinations.    Reports she is currently compliant on medications.  Denies side effects.  Patient denies any suicidality, homicidality or perceptual disturbances.  Patient is not interested in making any further medication changes.  Patient denies any other concerns today.  Visit Diagnosis:    ICD-10-CM   1. Schizoaffective disorder, bipolar type ([place])  F25.0 buPROPion (WELLBUTRIN XL) 300 MG 24 hr tablet    hydrOXYzine (ATARAX) 10 MG tablet    2. PTSD  (post-traumatic stress disorder)  F43.10 prazosin (MINIPRESS) 2 MG capsule    buPROPion (WELLBUTRIN XL) 300 MG 24 hr tablet    hydrOXYzine (ATARAX) 10 MG tablet    3. Social anxiety disorder  F40.10     4. Insomnia due to mental condition  F51.05    mood    5. Tobacco use disorder  F17.200       Past Psychiatric History: Reviewed past psychiatric history from progress note on 07/16/2017.  Past Medical History:  Past Medical History:  Diagnosis Date   Abnormal thyroid stimulating hormone (TSH) level    Anemia    Anxiety    Asthma    Bipolar 1 disorder (HCC)    COPD (chronic obstructive pulmonary disease) (HCC)    Endometriosis    GERD (gastroesophageal reflux disease)    Hepatitis A    Hypertension    Hypothyroidism    Malignant neoplasm of upper-inner quadrant of left breast in female, estrogen receptor positive ([place]) 03/2022   Pre-diabetes    PTSD (post-traumatic stress disorder)    Schizoaffective disorder ([place])     Past Surgical History:  Procedure Laterality Date   BREAST BIOPSY Left 03/03/2022   Left breast stereo bx Ribbon Clip- path pending   BREAST BIOPSY Left 03/03/2022   MM LT BREAST BX W LOC DEV 1ST LESION IMAGE BX SPEC STEREO GUIDE 03/03/2022 [facility]   ECTOPIC PREGNANCY SURGERY     PART MASTECTOMY,RADIO FREQUENCY LOCALIZER,AXILLARY SENTINEL NODE BIOPSY Left 03/20/2022   Procedure: PART MASTECTOMY,RADIO FREQUENCY LOCALIZER,AXILLARY SENTINEL NODE BIOPSY;  Surgeon: Herbert Pun, MD;  Location: [facility] ORS;  Service: General;  Laterality:  Left;   TOTAL ABDOMINAL HYSTERECTOMY W/ BILATERAL SALPINGOOPHORECTOMY  1990    Family Psychiatric History: Reviewed family psychiatric history from progress note on 07/16/2017.  Family History:  Family History  Problem Relation Age of Onset   Hypertension Father    Diabetes Father    Heart disease Father    Blindness Father    Alcohol abuse Sister    Drug abuse Sister    Anxiety disorder Sister     Depression Sister    Obesity Brother    Arthritis Brother    Alcohol abuse Brother    Drug abuse Brother    Depression Brother    Breast cancer Cousin    Clotting disorder Neg Hx     Social History: Reviewed social history from 07/16/2017. Social History   Socioeconomic History   Marital status: Single    Spouse name: Not on file   Number of children: 0   Years of education: Not on file   Highest education level: Associate degree: occupational, Hotel manager, or vocational program  Occupational History   Not on file  Tobacco Use   Smoking status: Every Day    Packs/day: 0.50    Types: Cigarettes    Start date: 01/22/1969   Smokeless tobacco: Never   Tobacco comments:    Continues to go down on number per day to 5-7 per day.   Vaping Use   Vaping Use: Never used  Substance and Sexual Activity   Alcohol use: Not Currently    Alcohol/week: 1.0 - 2.0 standard drink of alcohol    Types: 1 Glasses of wine per week    Comment: occasional   Drug use: No   Sexual activity: Not Currently  Other Topics Concern   Not on file  Social History Narrative   Lives alone   Social Determinants of Health   Financial Resource Strain: Medium Risk (06/15/2017)   Overall Financial Resource Strain (CARDIA)    Difficulty of Paying Living Expenses: Somewhat hard  Food Insecurity: Food Insecurity Present (06/15/2017)   Hunger Vital Sign    Worried About Running Out of Food in the Last Year: Sometimes true    Ran Out of Food in the Last Year: Sometimes true  Transportation Needs: No Transportation Needs (06/15/2017)   PRAPARE - Hydrologist (Medical): No    Lack of Transportation (Non-Medical): No  Physical Activity: Inactive (06/15/2017)   Exercise Vital Sign    Days of Exercise per Week: 0 days    Minutes of Exercise per Session: 0 min  Stress: Stress Concern Present (06/15/2017)   Ducor     Feeling of Stress : Very much  Social Connections: Socially Isolated (06/15/2017)   Social Connection and Isolation Panel [NHANES]    Frequency of Communication with Friends and Family: Never    Frequency of Social Gatherings with Friends and Family: Never    Attends Religious Services: Never    Marine scientist or Organizations: No    Attends Archivist Meetings: Never    Marital Status: Divorced    Allergies:  Allergies  Allergen Reactions   Shellfish Allergy Anaphylaxis    Only to crab meat (non-imitation), not shrimp   Sulfa Antibiotics Hives, Anaphylaxis and Other (See Comments)   Sulfasalazine Anaphylaxis and Hives    Other reaction(s): Other (See Comments)   Lisinopril     unknown    Metabolic Disorder Labs: Lab Results  Component Value Date   HGBA1C 5.7 (H) 05/06/2020   MPG 116.89 05/06/2020   MPG 117 12/21/2017   Lab Results  Component Value Date   PROLACTIN 1.3 (L) 05/06/2020   PROLACTIN 1.2 (L) 07/12/2017   Lab Results  Component Value Date   CHOL 193 05/06/2020   TRIG 103 05/06/2020   HDL 56 05/06/2020   CHOLHDL 3.4 05/06/2020   VLDL 21 05/06/2020   LDLCALC 116 (H) 05/06/2020   LDLCALC 163 (H) 12/21/2017   Lab Results  Component Value Date   TSH 2.250 06/17/2021   TSH 2.143 05/06/2020    Therapeutic Level Labs: No results found for: "LITHIUM" No results found for: "VALPROATE" No results found for: "CBMZ"  Current Medications: Current Outpatient Medications  Medication Sig Dispense Refill   albuterol (VENTOLIN HFA) 108 (90 Base) MCG/ACT inhaler Inhale into the lungs.     alendronate (FOSAMAX) 70 MG tablet Take 70 mg by mouth once a week.     amLODipine (NORVASC) 10 MG tablet Take 10 mg by mouth daily.     ARIPiprazole (ABILIFY) 20 MG tablet Take 1 tablet (20 mg total) by mouth daily. 90 tablet 0   atorvastatin (LIPITOR) 20 MG tablet Take 20 mg by mouth at bedtime.     B Complex-C (B-COMPLEX WITH VITAMIN C) tablet Take 1 tablet  by mouth daily.     calcium-vitamin D (OSCAL WITH D) 500-200 MG-UNIT tablet Take 1 tablet by mouth daily with breakfast.     furosemide (LASIX) 20 MG tablet Take 20 mg by mouth daily.      levothyroxine (SYNTHROID, LEVOTHROID) 112 MCG tablet      meloxicam (MOBIC) 15 MG tablet Take 1 tablet by mouth daily.     nortriptyline (PAMELOR) 50 MG capsule TAKE 1 CAPSULE BY MOUTH AT BEDTIME 90 capsule 0   omeprazole (PRILOSEC) 40 MG capsule Take 40 mg by mouth daily.      PREVIDENT 5000 DRY MOUTH 1.1 % GEL dental gel Place onto teeth.     SPIRIVA HANDIHALER 18 MCG inhalation capsule      valACYclovir (VALTREX) 1000 MG tablet      buPROPion (WELLBUTRIN XL) 300 MG 24 hr tablet Take 1 tablet (300 mg total) by mouth daily. 90 tablet 0   hydrOXYzine (ATARAX) 10 MG tablet Take 2 tablets (20 mg total) by mouth at bedtime as needed. for sleep and anxiety 180 tablet 0   prazosin (MINIPRESS) 2 MG capsule Take 1 capsule (2 mg total) by mouth at bedtime. 90 capsule 1   No current facility-administered medications for this visit.     Musculoskeletal: Strength & Muscle Tone: within normal limits Gait & Station: normal Patient leans: N/A  Psychiatric Specialty Exam: Review of Systems  Musculoskeletal:  Positive for back pain.  Psychiatric/Behavioral:  Positive for dysphoric mood. The patient is nervous/anxious.   All other systems reviewed and are negative.   Blood pressure 136/75, pulse 80, temperature 97.8 F (36.6 C), height '5\' 1"'$  (1.549 m), weight 206 lb (93.4 kg), SpO2 97 %.Body mass index is 38.92 kg/m.  General Appearance: Casual  Eye Contact:  Fair  Speech:  Clear and Coherent  Volume:  Normal  Mood:  Anxious and Depressed  Affect:  Congruent  Thought Process:  Goal Directed and Descriptions of Associations: Intact  Orientation:  Full (Time, Place, and Person)  Thought Content: Logical   Suicidal Thoughts:  No  Homicidal Thoughts:  No  Memory:  Immediate;   Fair Recent;  Fair Remote;    limited  Judgement:  Fair  Insight:  Fair  Psychomotor Activity:  Normal  Concentration:  Concentration: Fair and Attention Span: Fair  Recall:  [employer] of Knowledge: Fair  Language: Fair  Akathisia:  No  Handed:  Right  AIMS (if indicated): done  Assets:  Communication Skills Desire for Improvement Housing Transportation  ADL's:  Intact  Cognition: WNL  Sleep:  Fair   Screenings: [place] Visit from 12/23/2021 in [place] [facility] Office Visit from 11/24/2021 in [place] Office Visit from 10/16/2021 in [facility] Office Visit from 09/24/2021 in [place] Video Visit from 08/29/2021 in [facility] Total Score 0 0 0 0 0      AUDIT    Flowsheet Row Admission (Discharged) from 12/22/2017 in [facility]  Alcohol Use Disorder Identification Test Final Score (AUDIT) 1      GAD-7    Flowsheet Row Office Visit from 05/06/2022 in [facility] Office Visit from 12/23/2021 in [facility] Office Visit from 11/24/2021 in [place] [facility] Office Visit from 09/24/2021 in [facility] Office Visit from 06/05/2021 in [facility]  Total GAD-7 Score '6 4 12 5 7      '$ PHQ2-9    [place] Visit from 05/06/2022 in [place] Office Visit from 12/23/2021 in [facility] Office Visit from 11/24/2021 in [place] Office Visit from 10/16/2021 in [facility] Office Visit from 09/24/2021 in [facility]  PHQ-2 Total Score '3 3 3 3 5  '$ PHQ-9 Total Score '11 6 9 7 17      '$ [place] Office Visit from 05/06/2022 in [facility] Admission (Discharged) from 03/20/2022 in [facility] Office Visit from 12/23/2021 in [facility] No Risk No Risk No Risk        Assessment and Plan: FLAVIA BRUSS is a 62 year old Caucasian female, divorced, on SSD, currently lives in [place], has a history of schizoaffective disorder, social anxiety disorder, PTSD, recent diagnosis of breast cancer left-sided status post mastectomy, was evaluated in office today.  Patient although depressed currently he is not interested in medication management.  Discussed plan as noted below.  Plan Schizoaffective disorder-unstable Continue Abilify 20 mg p.o. daily-reduced dosage.  Discussed increasing the dosage of Abilify to 25 mg p.o. daily and reducing Wellbutrin to a lower dosage.  Patient declines. Continue Wellbutrin XL 300 mg p.o. daily Nortriptyline 50 mg p.o. nightly Patient to establish care with psychotherapist-used to be under the care of [facility] in [place] previously.  Patient to reestablish care.  PTSD-improving Nortriptyline as prescribed Prazosin 2 mg p.o. nightly Hydroxyzine 10 to 20 mg p.o. nightly as needed Continue CBT as needed  Social anxiety disorder-improving Continue CBT  Insomnia-stable Nortriptyline 50 mg p.o. nightly Prazosin 2 mg p.o. nightly Continue sleep hygiene techniques  Reviewed notes per Dr. Janese Banks oncology-dated 04/13/2022 as noted above.  Follow-up in clinic in 1 month or sooner if needed. This note was generated in part or whole with voice recognition software. Voice  recognition is usually quite accurate but there are transcription errors that can and very often do occur. I apologize for  any typographical errors that were not detected and corrected.     Ursula Alert, MD 05/06/2022, 5:31 PM

## 2022-05-07 ENCOUNTER — Other Ambulatory Visit: Payer: Medicare Other

## 2022-05-08 ENCOUNTER — Other Ambulatory Visit: Payer: Medicare HMO

## 2022-06-05 ENCOUNTER — Other Ambulatory Visit: Payer: Self-pay

## 2022-06-05 ENCOUNTER — Emergency Department
Admission: EM | Admit: 2022-06-05 | Discharge: 2022-06-05 | Disposition: A | Discharge status: Home / Self Care | Payer: Medicare HMO | Attending: Emergency Medicine | Admitting: Emergency Medicine

## 2022-06-05 DIAGNOSIS — Z599 Problem related to housing and economic circumstances, unspecified: Secondary | ICD-10-CM | POA: Diagnosis present

## 2022-06-05 NOTE — ED Triage Notes (Signed)
Patient arrives with EMS stating that she does not feel well.  She says that people were smoking crack cocaine in her building, and now she feels like she has chalky skin and a dry mouth.  She also complains that her skin is red and bumpy around her neck.  Patient was ambulatory with steady gait on arrival.  No distress noted.

## 2022-06-05 NOTE — ED Provider Notes (Signed)
   [facility] Provider Note    Event Date/Time   First MD Initiated Contact with Patient 06/05/22 2053     (approximate)   History   People are smoking crack   HPI  Patricia Good is a 62 y.o. female who presents to the emergency department today because she would like to sleep here for 2 to 3 days.  She states that where she is living other people in her apartment complex smoke crack.  She states that she does not react well when people are smoking crack.  She denies any medical complaints.     Physical Exam   Triage Vital Signs: ED Triage Vitals [06/05/22 2045]  Enc Vitals Group     BP 134/73     Pulse Rate 92     Resp 18     Temp 98.1 F (36.7 C)     Temp Source Oral     SpO2 97 %     Weight      Height      Head Circumference      Peak Flow      Pain Score      Pain Loc      Pain Edu?      Excl. in [place]?     Most recent vital signs: Vitals:   06/05/22 2100 06/05/22 2101  BP:    Pulse:    Resp:    Temp:    SpO2: 97% 97%   General: Awake, alert, oriented. CV:  Good peripheral perfusion.  Resp:  Normal effort.  Abd:  No distention.     ED Results / Procedures / Treatments   Labs (all labs ordered are listed, but only abnormal results are displayed) Labs Reviewed - No data to display   EKG  None   RADIOLOGY None    PROCEDURES:  Critical Care performed: No  Procedures    MEDICATIONS ORDERED IN ED: Medications - No data to display   IMPRESSION / MDM / [place] / ED COURSE  I reviewed the triage vital signs and the nursing notes.                              Patient presented to the emergency department today because she was looking for a place to sleep for 2 to 3 days because she is having a hard time sleeping at her apartment because people in the apartment complex her smoking crack.  I did have a discussion with the patient that this would not be an appropriate place for her to simply sleep for  a couple of days.  She denies any medical complaints.  Will discharge.   FINAL CLINICAL IMPRESSION(S) / ED DIAGNOSES   Final diagnoses:  Housing problems    Note:  This document was prepared using Dragon voice recognition software and may include unintentional dictation errors.    Nance Pear, MD 06/05/22 2147

## 2022-06-05 NOTE — Discharge Instructions (Signed)
Please seek medical attention for any high fevers, chest pain, shortness of breath, change in behavior, persistent vomiting, bloody stool or any other new or concerning symptoms.  

## 2022-06-18 ENCOUNTER — Ambulatory Visit (INDEPENDENT_AMBULATORY_CARE_PROVIDER_SITE_OTHER): Payer: Medicare HMO | Admitting: Psychiatry

## 2022-06-18 ENCOUNTER — Telehealth: Payer: Self-pay | Admitting: Psychiatry

## 2022-06-18 ENCOUNTER — Encounter: Payer: Self-pay | Admitting: Psychiatry

## 2022-06-18 VITALS — BP 131/75 | HR 91 | Temp 97.9°F | Ht 61.0 in | Wt 205.4 lb

## 2022-06-18 DIAGNOSIS — F4310 Post-traumatic stress disorder, unspecified: Secondary | ICD-10-CM | POA: Diagnosis not present

## 2022-06-18 DIAGNOSIS — F17200 Nicotine dependence, unspecified, uncomplicated: Secondary | ICD-10-CM

## 2022-06-18 DIAGNOSIS — F5105 Insomnia due to other mental disorder: Secondary | ICD-10-CM | POA: Diagnosis not present

## 2022-06-18 DIAGNOSIS — F4010 Social phobia, unspecified: Secondary | ICD-10-CM | POA: Diagnosis not present

## 2022-06-18 DIAGNOSIS — F250 Schizoaffective disorder, bipolar type: Secondary | ICD-10-CM

## 2022-06-18 DIAGNOSIS — Z9189 Other specified personal risk factors, not elsewhere classified: Secondary | ICD-10-CM

## 2022-06-18 MED ORDER — NORTRIPTYLINE HCL 10 MG PO CAPS: 10.0000 mg | ORAL_CAPSULE | Freq: Every day | ORAL | 0 refills | Status: DC

## 2022-06-18 MED ORDER — OLANZAPINE 5 MG PO TABS: 5.0000 mg | ORAL_TABLET | Freq: Every day | ORAL | 1 refills | Status: DC

## 2022-06-18 MED ORDER — ARIPIPRAZOLE 5 MG PO TABS: 5.0000 mg | ORAL_TABLET | Freq: Every day | ORAL | 0 refills | Status: DC

## 2022-06-18 NOTE — Telephone Encounter (Signed)
Contacted patient to discuss.  Discussed with patient to follow the instructions provided today regarding medication changes.  However discussed with patient if in a crisis to go to the nearest emergency department.  Patient agrees with same.

## 2022-06-18 NOTE — Patient Instructions (Signed)
Stop taking Nortriptyline 50 mg at bedtime. Start taking Nortriptyline 10 mg for the next 5 days and stop taking it.  Stop taking Abilify 20 mg daily in the morning. Start taking Abilify 5 mg daily for the next 21 days.  Start taking Olanzapine 5 mg at bedtime.  The Olanzapine is going to replace your nortriptyline and Abilify and will help with her mood and sleep.  After 21 days wants to completely get off of the Abilify we can consider increasing your olanzapine.  In the meantime please try to get the EKG completed by calling Please call for EKG - [place]

## 2022-06-18 NOTE — Telephone Encounter (Signed)
Patient called after being seen today. Spoke to patient and she stated that she wanted you to know that she does not want to be put in the hospital because she was upset at the visit.

## 2022-06-18 NOTE — Progress Notes (Signed)
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  Patient reports she has been struggling with sleep problems.  She reports this has been getting worse since the past several weeks.  Patient reports her relatives in 'spirit forms' are bothering her at night.  She reports they are playing games with her, talking to her, stealing from her and this is affecting her sleep.  She reports she has not slept in a very long time and this is affecting her.  She however does not believe she needs treatment for her relatives bothering her since to her it ' normal'.  She does not believe medications can take that away from her.  She however just wants to sleep.  Patient currently denies any suicidality or homicidality.  Patient appeared to be alert, oriented to person place time and situation.  Patient today reports she is currently compliant with medications.  Patient was evaluated in the emergency department on 06/05/2022-reviewed notes per Dr. Tama Headings was discharged after having a discussion that emergency department will not be an appropriate place for her to simply sleep for a couple of days, diagnosis documented as housing problems.'  Visit Diagnosis:    ICD-10-CM   1. Schizoaffective disorder, bipolar type ([place])  F25.0 nortriptyline (PAMELOR) 10 MG capsule     ARIPiprazole (ABILIFY) 5 MG tablet    OLANZapine (ZYPREXA) 5 MG tablet    EKG 12-Lead    2. PTSD (post-traumatic stress disorder)  F43.10 nortriptyline (PAMELOR) 10 MG capsule    ARIPiprazole (ABILIFY) 5 MG tablet    EKG 12-Lead    3. Social anxiety disorder  F40.10     4. Insomnia due to mental condition  F51.05    mood    5. Tobacco use disorder  F17.200     6. At risk for prolonged QT interval syndrome  Z91.89 EKG 12-Lead      Past Psychiatric History: Reviewed past psychiatric history from progress note on 07/16/2017.  Past Medical History:  Past Medical History:  Diagnosis Date   Abnormal thyroid stimulating hormone (TSH) level    Anemia    Anxiety    Asthma    Bipolar 1 disorder (HCC)    COPD (chronic obstructive pulmonary disease) (HCC)    Endometriosis    GERD (gastroesophageal reflux disease)    Hepatitis A    Hypertension    Hypothyroidism    Malignant neoplasm of upper-inner quadrant of left breast in female, estrogen receptor positive ([place]) 03/2022   Pre-diabetes    PTSD (post-traumatic stress disorder)    Schizoaffective disorder [facility])     Past Surgical History:  Procedure Laterality Date   BREAST BIOPSY Left 03/03/2022   Left breast stereo bx Ribbon Clip- path pending   BREAST BIOPSY Left 03/03/2022   MM LT BREAST BX W LOC DEV 1ST LESION IMAGE BX SPEC STEREO GUIDE 03/03/2022 [facility]   ECTOPIC PREGNANCY SURGERY  PART MASTECTOMY,RADIO FREQUENCY LOCALIZER,AXILLARY SENTINEL NODE BIOPSY Left 03/20/2022   Procedure: PART MASTECTOMY,RADIO FREQUENCY LOCALIZER,AXILLARY SENTINEL NODE BIOPSY;  Surgeon: Herbert Pun, MD;  Location: [facility] ORS;  Service: General;  Laterality: Left;   TOTAL ABDOMINAL HYSTERECTOMY W/ BILATERAL SALPINGOOPHORECTOMY  1990    Family Psychiatric History: Reviewed family psych History from progress note on 07/16/2017.  Family History:  Family History  Problem Relation Age of Onset   Hypertension Father    Diabetes  Father    Heart disease Father    Blindness Father    Alcohol abuse Sister    Drug abuse Sister    Anxiety disorder Sister    Depression Sister    Obesity Brother    Arthritis Brother    Alcohol abuse Brother    Drug abuse Brother    Depression Brother    Breast cancer Cousin    Clotting disorder Neg Hx     Social History: Reviewed social history from progress note on 07/16/2017. Social History   Socioeconomic History   Marital status: Single    Spouse name: Not on file   Number of children: 0   Years of education: Not on file   Highest education level: Associate degree: occupational, Hotel manager, or vocational program  Occupational History   Not on file  Tobacco Use   Smoking status: Every Day    Packs/day: .5    Types: Cigarettes    Start date: 01/22/1969   Smokeless tobacco: Never   Tobacco comments:    Continues to go down on number per day to 5-7 per day.   Vaping Use   Vaping Use: Never used  Substance and Sexual Activity   Alcohol use: Not Currently    Alcohol/week: 1.0 - 2.0 standard drink of alcohol    Types: 1 Glasses of wine per week    Comment: occasional   Drug use: No   Sexual activity: Not Currently  Other Topics Concern   Not on file  Social History Narrative   Lives alone   Social Determinants of Health   Financial Resource Strain: Medium Risk (06/15/2017)   Overall Financial Resource Strain (CARDIA)    Difficulty of Paying Living Expenses: Somewhat hard  Food Insecurity: Food Insecurity Present (06/15/2017)   Hunger Vital Sign    Worried About Running Out of Food in the Last Year: Sometimes true    Ran Out of Food in the Last Year: Sometimes true  Transportation Needs: No Transportation Needs (06/15/2017)   PRAPARE - Hydrologist (Medical): No    Lack of Transportation (Non-Medical): No  Physical Activity: Inactive (06/15/2017)   Exercise Vital Sign    Days of Exercise per Week: 0 days    Minutes of Exercise per  Session: 0 min  Stress: Stress Concern Present (06/15/2017)   [place]    Feeling of Stress : Very much  Social Connections: Socially Isolated (06/15/2017)   Social Connection and Isolation Panel [NHANES]    Frequency of Communication with Friends and Family: Never    Frequency of Social Gatherings with Friends and Family: Never    Attends Religious Services: Never    Marine scientist or Organizations: No    Attends Archivist Meetings: Never    Marital Status: Divorced    Allergies:  Allergies  Allergen Reactions   Shellfish Allergy Anaphylaxis    Only to crab meat (non-imitation), not shrimp   Sulfa Antibiotics Hives,  Anaphylaxis and Other (See Comments)   Sulfasalazine Anaphylaxis and Hives    Other reaction(s): Other (See Comments)   Lisinopril     unknown    Metabolic Disorder Labs: Lab Results  Component Value Date   HGBA1C 5.7 (H) 05/06/2020   MPG 116.89 05/06/2020   MPG 117 12/21/2017   Lab Results  Component Value Date   PROLACTIN 1.3 (L) 05/06/2020   PROLACTIN 1.2 (L) 07/12/2017   Lab Results  Component Value Date   CHOL 193 05/06/2020   TRIG 103 05/06/2020   HDL 56 05/06/2020   CHOLHDL 3.4 05/06/2020   VLDL 21 05/06/2020   LDLCALC 116 (H) 05/06/2020   LDLCALC 163 (H) 12/21/2017   Lab Results  Component Value Date   TSH 2.250 06/17/2021   TSH 2.143 05/06/2020    Therapeutic Level Labs: No results found for: "LITHIUM" No results found for: "VALPROATE" No results found for: "CBMZ"  Current Medications: Current Outpatient Medications  Medication Sig Dispense Refill   albuterol (VENTOLIN HFA) 108 (90 Base) MCG/ACT inhaler Inhale into the lungs.     alendronate (FOSAMAX) 70 MG tablet Take 70 mg by mouth once a week.     amLODipine (NORVASC) 10 MG tablet Take 10 mg by mouth daily.     ARIPiprazole (ABILIFY) 5 MG tablet Take 1 tablet (5 mg total) by mouth daily for 21  days. Stop Abilify 20 mg . 21 tablet 0   atorvastatin (LIPITOR) 20 MG tablet Take 20 mg by mouth at bedtime.     B Complex-C (B-COMPLEX WITH VITAMIN C) tablet Take 1 tablet by mouth daily.     buPROPion (WELLBUTRIN XL) 300 MG 24 hr tablet Take 1 tablet (300 mg total) by mouth daily. 90 tablet 0   calcium-vitamin D (OSCAL WITH D) 500-200 MG-UNIT tablet Take 1 tablet by mouth daily with breakfast.     furosemide (LASIX) 20 MG tablet Take 20 mg by mouth daily.      hydrOXYzine (ATARAX) 10 MG tablet Take 2 tablets (20 mg total) by mouth at bedtime as needed. for sleep and anxiety 180 tablet 0   levothyroxine (SYNTHROID, LEVOTHROID) 112 MCG tablet      meloxicam (MOBIC) 15 MG tablet Take 1 tablet by mouth daily.     nortriptyline (PAMELOR) 10 MG capsule Take 1 capsule (10 mg total) by mouth at bedtime for 5 days. Stop taking in 5 days. Stop Nortriptyline 50 mg. 5 capsule 0   OLANZapine (ZYPREXA) 5 MG tablet Take 1 tablet (5 mg total) by mouth at bedtime. 30 tablet 1   omeprazole (PRILOSEC) 40 MG capsule Take 40 mg by mouth daily.      prazosin (MINIPRESS) 2 MG capsule Take 1 capsule (2 mg total) by mouth at bedtime. 90 capsule 1   PREVIDENT 5000 DRY MOUTH 1.1 % GEL dental gel Place onto teeth.     SPIRIVA HANDIHALER 18 MCG inhalation capsule      valACYclovir (VALTREX) 1000 MG tablet      No current facility-administered medications for this visit.     Musculoskeletal: Strength & Muscle Tone: within normal limits Gait & Station: normal Patient leans: N/A  Psychiatric Specialty Exam: Review of Systems  Constitutional:  Positive for fatigue.  Psychiatric/Behavioral:  Positive for hallucinations and sleep disturbance.   All other systems reviewed and are negative.   Blood pressure 131/75, pulse 91, temperature 97.9 F (36.6 C), temperature source Skin, height '5\' 1"'$  (1.549 m), weight 205 lb 6.4 oz (93.2 kg).Body  mass index is 38.81 kg/m.  General Appearance: Casual  Eye Contact:  Fair   Speech:  Clear and Coherent  Volume:  Normal  Mood:   Reports she is tired, denies any other concerns  Affect:  Restricted  Thought Process:  Goal Directed and Descriptions of Associations: Intact  Orientation:  Full (Time, Place, and Person)  Thought Content: Delusions and Hallucinations: Auditory hear her relatives in spirit form and they play games with her  Suicidal Thoughts:  No  Homicidal Thoughts:  No  Memory:  Immediate;   Fair Recent;   Fair Remote;   limited  Judgement:  Fair  Insight:  Shallow  Psychomotor Activity:  Normal  Concentration:  Concentration: Fair and Attention Span: Fair  Recall:  [employer] of Knowledge: Fair  Language: Fair  Akathisia:  No  Handed:  Right  AIMS (if indicated): done  Assets:  Communication Skills Desire for Improvement Housing Transportation  ADL's:  Intact  Cognition: WNL  Sleep:  Poor   Screenings: Administrator, Civil Service Office Visit from 06/18/2022 in [place] Office Visit from 05/06/2022 in [place] Office Visit from 12/23/2021 in [place] Office Visit from 11/24/2021 in [place] Office Visit from 10/16/2021 in [place] Total Score 0 0 0 0 0      AUDIT    Flowsheet Row Admission (Discharged) from 12/22/2017 in [place]  Alcohol Use Disorder Identification Test Final Score (AUDIT) 1      GAD-7    Flowsheet Row Office Visit from 06/18/2022 in [place] Office Visit from 05/06/2022 in [place] Office Visit from 12/23/2021 in [place] Office Visit from 11/24/2021 in [place] Office Visit from 09/24/2021 in [place]  Total GAD-7 Score '3 6 4 12 5      '$ PHQ2-9    [place] Office Visit from 06/18/2022 in [place] Office Visit from 05/06/2022 in [place] Office Visit from 12/23/2021 in [place] Office Visit from 11/24/2021 in [place] Office Visit from 10/16/2021 in [place]  PHQ-2 Total Score '1 3 3 3 3  '$ PHQ-9 Total Score '7 11 6 9 7      '$ [place] Office Visit from 06/18/2022 in [place] ED from 06/05/2022 in [facility] Department at [facility] Visit from 05/06/2022 in [place] No Risk No Risk No Risk        Assessment and Plan: STEPHAIE CAMPIONE is a 59 year old Caucasian female, divorced, on SSD, currently lives in [place], has a history of schizoaffective disorder, social anxiety disorder, PTSD, recent diagnosis of breast cancer left-sided status post mastectomy was evaluated in office today.  Patient with recent visit to the emergency department for lack of sleeping, housing problems, currently presents with worsening sleep issues, ongoing delusions, hallucinations-will benefit from the following plan.  Plan Schizoaffective disorder-unstable Taper off Abilify, patient advised to take Abilify 5 mg p.o. daily for the next 21 days and stop. Start olanzapine 5 mg p.o. nightly. Taper of nortriptyline, due to drug to drug  interaction concerns with Abilify and olanzapine, patient being on polypharmacy. Patient advised to start taking nortriptyline 10 mg p.o. daily for the next 5 days and stop. Discontinue nortriptyline 50 mg p.o. nightly Continue Wellbutrin XL 300 mg p.o. daily.  If patient continues to have sleep  issues as well as hallucinations we will consider reducing the dosage of Wellbutrin in the future.  PTSD-unstable Prazosin 2 mg p.o. nightly Added olanzapine as noted above Hydroxyzine 10 to 20 mg p.o. nightly as needed Discussed with patient to reestablish care with therapist.  Social anxiety disorder-improving Patient advised to reestablish care with therapist.  Insomnia-unstable Start olanzapine 5 mg p.o. nightly for mood and sleep as well as psychosis.  At risk for prolonged QT syndrome-we will order EKG-patient to call [phone].    Follow-up in clinic in 3 to 4 weeks or sooner if needed.     Collaboration of Care: Collaboration of Care: Other I have reviewed notes Dr. Ethelene Browns department physician dated 06/05/2022 as noted above.  Patient/Guardian was advised Release of Information must be obtained prior to any record release in order to collaborate their care with an outside provider. Patient/Guardian was advised if they have not already done so to contact the registration department to sign all necessary forms in order for [place] to release information regarding their care.   Consent: Patient/Guardian gives verbal consent for treatment and assignment of benefits for services provided during this visit. Patient/Guardian expressed understanding and agreed to proceed.   This note was generated in part or whole with voice recognition software. Voice recognition is usually quite accurate but there are transcription errors that can and very often do occur. I apologize for any typographical errors that were not detected and corrected.    Ursula Alert, MD 06/18/2022, 6:09 PM

## 2022-06-25 ENCOUNTER — Ambulatory Visit
Admission: RE | Admit: 2022-06-25 | Discharge: 2022-06-25 | Disposition: A | Discharge status: Home / Self Care | Payer: Medicare HMO | Source: Ambulatory Visit | Attending: Oncology | Admitting: Oncology

## 2022-06-25 DIAGNOSIS — M8589 Other specified disorders of bone density and structure, multiple sites: Secondary | ICD-10-CM | POA: Diagnosis not present

## 2022-06-25 DIAGNOSIS — J449 Chronic obstructive pulmonary disease, unspecified: Secondary | ICD-10-CM | POA: Insufficient documentation

## 2022-06-25 DIAGNOSIS — Z87891 Personal history of nicotine dependence: Secondary | ICD-10-CM | POA: Diagnosis not present

## 2022-06-25 DIAGNOSIS — E039 Hypothyroidism, unspecified: Secondary | ICD-10-CM | POA: Diagnosis not present

## 2022-06-25 DIAGNOSIS — C50212 Malignant neoplasm of upper-inner quadrant of left female breast: Secondary | ICD-10-CM | POA: Insufficient documentation

## 2022-06-25 DIAGNOSIS — Z780 Asymptomatic menopausal state: Secondary | ICD-10-CM | POA: Diagnosis not present

## 2022-06-25 DIAGNOSIS — Z170 Estrogen receptor positive status [ER+]: Secondary | ICD-10-CM | POA: Diagnosis present

## 2022-06-25 DIAGNOSIS — Z13820 Encounter for screening for osteoporosis: Secondary | ICD-10-CM | POA: Insufficient documentation

## 2022-07-03 ENCOUNTER — Ambulatory Visit: Payer: Medicare HMO | Admitting: Oncology

## 2022-07-08 ENCOUNTER — Ambulatory Visit: Payer: No Typology Code available for payment source | Admitting: Psychiatry

## 2022-07-08 ENCOUNTER — Ambulatory Visit: Payer: Medicare HMO | Admitting: Oncology

## 2022-08-14 ENCOUNTER — Encounter: Payer: Self-pay | Admitting: Oncology

## 2022-08-14 ENCOUNTER — Inpatient Hospital Stay: Payer: Medicare HMO | Attending: Oncology | Admitting: Oncology

## 2022-08-14 VITALS — BP 121/54 | HR 82 | Temp 97.2°F | Resp 18 | Ht 60.1 in | Wt 201.2 lb

## 2022-08-14 DIAGNOSIS — M8580 Other specified disorders of bone density and structure, unspecified site: Secondary | ICD-10-CM | POA: Diagnosis not present

## 2022-08-14 DIAGNOSIS — F17210 Nicotine dependence, cigarettes, uncomplicated: Secondary | ICD-10-CM | POA: Diagnosis not present

## 2022-08-14 DIAGNOSIS — Z170 Estrogen receptor positive status [ER+]: Secondary | ICD-10-CM | POA: Diagnosis not present

## 2022-08-14 DIAGNOSIS — Z7189 Other specified counseling: Secondary | ICD-10-CM | POA: Diagnosis not present

## 2022-08-14 DIAGNOSIS — C50212 Malignant neoplasm of upper-inner quadrant of left female breast: Secondary | ICD-10-CM | POA: Diagnosis not present

## 2022-08-14 DIAGNOSIS — Z79899 Other long term (current) drug therapy: Secondary | ICD-10-CM | POA: Insufficient documentation

## 2022-08-14 MED ORDER — LETROZOLE 2.5 MG PO TABS: 2.5000 mg | ORAL_TABLET | Freq: Every day | ORAL | 3 refills | Status: DC

## 2022-08-16 NOTE — Progress Notes (Signed)
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 This was followed by a diagnostic mammogram which showed a 3 mm group of calcifications in the upper inner quadrant of the left breast and a biopsy was recommended. Core biopsy showed invasive mammary carcinoma 2 mm grade 2 ER greater than 90% positive PR greater than 90% positive and HER2 negative.    Patient has baseline osteoporosis and is currently on weekly Fosamax   Final pathology showed 4 mm grade 3 tumor with negative margins.  Given the high negative tumor Oncotype testing was sent and came back at high risk with a recurrence score of 28.  Patient declined adjuvant chemotherapy.  She started adjuvant radiation but stopped prematurely due to concern for side effects.  Patient has a history of schizoaffective/bipolar disorder, PTSD and social anxiety disorder for which she follows up with psychiatry    Interval history-patient is here to reestablish follow-up for breast cancer.  She is willing to consider endocrine therapy at this time.  She is concerned about potential side effects  ECOG PS- 1 Pain scale- 0   Review of systems- Review of Systems  Constitutional:  Negative for chills, fever, malaise/fatigue and weight loss.  HENT:  Negative for congestion, ear  discharge and nosebleeds.   Eyes:  Negative for blurred vision.  Respiratory:  Negative for cough, hemoptysis, sputum production, shortness of breath and wheezing.   Cardiovascular:  Negative for chest pain, palpitations, orthopnea and claudication.  Gastrointestinal:  Negative for abdominal pain, blood in stool, constipation, diarrhea, heartburn, melena, nausea and vomiting.  Genitourinary:  Negative for dysuria, flank pain, frequency, hematuria and urgency.  Musculoskeletal:  Negative for back pain, joint pain and myalgias.  Skin:  Negative for rash.  Neurological:  Negative for dizziness, tingling, focal weakness, seizures, weakness and headaches.  Endo/Heme/Allergies:  Does not bruise/bleed easily.  Psychiatric/Behavioral:  Negative for depression and suicidal ideas. The patient does not have insomnia.       Allergies  Allergen Reactions   Shellfish Allergy Anaphylaxis    Only to crab meat (non-imitation), not shrimp   Sulfa Antibiotics Hives, Anaphylaxis and Other (See Comments)   Sulfasalazine Anaphylaxis and Hives    Other reaction(s): Other (See Comments)   Lisinopril     unknown     Past Medical History:  Diagnosis Date   Abnormal thyroid stimulating hormone (TSH) level    Anemia    Anxiety    Asthma    Bipolar 1 disorder (HCC)    COPD (chronic obstructive pulmonary disease) (HCC)    Endometriosis    GERD (gastroesophageal reflux disease)    Hepatitis A    Hypertension    Hypothyroidism    Malignant neoplasm of upper-inner quadrant of left breast in female, 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   Pre-diabetes    PTSD (post-traumatic stress disorder)    Schizoaffective disorder (HCC)  Past Surgical History:  Procedure Laterality Date   BREAST BIOPSY Left 03/03/2022   Left breast stereo bx Ribbon Clip- path pending   BREAST BIOPSY Left 03/03/2022   MM LT BREAST BX W LOC DEV 1ST LESION IMAGE BX SPEC STEREO GUIDE 03/03/2022 [facility]   ECTOPIC  PREGNANCY SURGERY     PART MASTECTOMY,RADIO FREQUENCY LOCALIZER,AXILLARY SENTINEL NODE BIOPSY Left 03/20/2022   Procedure: PART MASTECTOMY,RADIO FREQUENCY LOCALIZER,AXILLARY SENTINEL NODE BIOPSY;  Surgeon: Carolan Shiver, MD;  Location: [facility];  Service: General;  Laterality: Left;   TOTAL ABDOMINAL HYSTERECTOMY W/ BILATERAL SALPINGOOPHORECTOMY  1990    Social History   Socioeconomic History   Marital status: Single    Spouse name: Not on file   Number of children: 0   Years of education: Not on file   Highest education level: Associate degree: occupational, Scientist, product/process development, or vocational program  Occupational History   Not on file  Tobacco Use   Smoking status: Every Day    Packs/day: .5    Types: Cigarettes    Start date: 01/22/1969   Smokeless tobacco: Never   Tobacco comments:    Continues to go down on number per day to 5-7 per day.   Vaping Use   Vaping Use: Never used  Substance and Sexual Activity   Alcohol use: Not Currently    Alcohol/week: 1.0 - 2.0 standard drink of alcohol    Types: 1 Glasses of wine per week    Comment: occasional   Drug use: No   Sexual activity: Not Currently  Other Topics Concern   Not on file  Social History Narrative   Lives alone   Social Determinants of Health   Financial Resource Strain: Medium Risk (06/15/2017)   Overall Financial Resource Strain (CARDIA)    Difficulty of Paying Living Expenses: Somewhat hard  Food Insecurity: Food Insecurity Present (06/15/2017)   Hunger Vital Sign    Worried About Running Out of Food in the Last Year: Sometimes true    Ran Out of Food in the Last Year: Sometimes true  Transportation Needs: No Transportation Needs (06/15/2017)   PRAPARE - Administrator, Civil Service (Medical): No    Lack of Transportation (Non-Medical): No  Physical Activity: Inactive (06/15/2017)   Exercise Vital Sign    Days of Exercise per Week: 0 days    Minutes of Exercise per Session: 0 min  Stress:  Stress Concern Present (06/15/2017)   [employer] of Occupational Health - Occupational Stress Questionnaire    Feeling of Stress : Very much  Social Connections: Socially Isolated (06/15/2017)   Social Connection and Isolation Panel [NHANES]    Frequency of Communication with Friends and Family: Never    Frequency of Social Gatherings with Friends and Family: Never    Attends Religious Services: Never    Database administrator or Organizations: No    Attends Banker Meetings: Never    Marital Status: Divorced  Catering manager Violence: Not At Risk (06/15/2017)   Humiliation, Afraid, Rape, and Kick questionnaire    Fear of Current or Ex-Partner: No    Emotionally Abused: No    Physically Abused: No    Sexually Abused: No    Family History  Problem Relation Age of Onset   Hypertension Father    Diabetes Father    Heart disease Father    Blindness Father    Alcohol abuse Sister    Drug abuse Sister    Anxiety disorder Sister  Depression Sister    Obesity Brother    Arthritis Brother    Alcohol abuse Brother    Drug abuse Brother    Depression Brother    Breast cancer Cousin    Clotting disorder Neg Hx      Current Outpatient Medications:    albuterol (VENTOLIN HFA) 108 (90 Base) MCG/ACT inhaler, Inhale into the lungs., Disp: , Rfl:    alendronate (FOSAMAX) 70 MG tablet, Take 70 mg by mouth once a week., Disp: , Rfl:    amLODipine (NORVASC) 10 MG tablet, Take 10 mg by mouth daily., Disp: , Rfl:    atorvastatin (LIPITOR) 20 MG tablet, Take 20 mg by mouth at bedtime., Disp: , Rfl:    B Complex-C (B-COMPLEX WITH VITAMIN C) tablet, Take 1 tablet by mouth daily., Disp: , Rfl:    buPROPion (WELLBUTRIN XL) 300 MG 24 hr tablet, Take 1 tablet (300 mg total) by mouth daily., Disp: 90 tablet, Rfl: 0   calcium-vitamin D (OSCAL WITH D) 500-200 MG-UNIT tablet, Take 1 tablet by mouth daily with breakfast., Disp: , Rfl:    furosemide (LASIX) 20 MG tablet, Take 20 mg by  mouth daily. , Disp: , Rfl:    hydrOXYzine (ATARAX) 10 MG tablet, Take 2 tablets (20 mg total) by mouth at bedtime as needed. for sleep and anxiety, Disp: 180 tablet, Rfl: 0   letrozole (FEMARA) 2.5 MG tablet, Take 1 tablet (2.5 mg total) by mouth daily., Disp: 30 tablet, Rfl: 3   levothyroxine (SYNTHROID, LEVOTHROID) 112 MCG tablet, , Disp: , Rfl:    meloxicam (MOBIC) 15 MG tablet, Take 1 tablet by mouth daily., Disp: , Rfl:    OLANZapine (ZYPREXA) 5 MG tablet, Take 1 tablet (5 mg total) by mouth at bedtime., Disp: 30 tablet, Rfl: 1   omeprazole (PRILOSEC) 40 MG capsule, Take 40 mg by mouth daily. , Disp: , Rfl:    prazosin (MINIPRESS) 2 MG capsule, Take 1 capsule (2 mg total) by mouth at bedtime., Disp: 90 capsule, Rfl: 1   PREVIDENT 5000 DRY MOUTH 1.1 % GEL dental gel, Place onto teeth., Disp: , Rfl:    SPIRIVA HANDIHALER 18 MCG inhalation capsule, , Disp: , Rfl:    valACYclovir (VALTREX) 1000 MG tablet, , Disp: , Rfl:    ARIPiprazole (ABILIFY) 5 MG tablet, Take 1 tablet (5 mg total) by mouth daily for 21 days. Stop Abilify 20 mg ., Disp: 21 tablet, Rfl: 0   nortriptyline (PAMELOR) 10 MG capsule, Take 1 capsule (10 mg total) by mouth at bedtime for 5 days. Stop taking in 5 days. Stop Nortriptyline 50 mg., Disp: 5 capsule, Rfl: 0  Physical exam:  Vitals:   08/14/22 1358  BP: (!) 121/54  Pulse: 82  Resp: 18  Temp: (!) 97.2 F (36.2 C)  TempSrc: Tympanic  SpO2: 97%  Weight: 201 lb 3.2 oz (91.3 kg)  Height: 5' 0.1" (1.527 m)   Physical Exam Cardiovascular:     Rate and Rhythm: Normal rate and regular rhythm.     Heart sounds: Normal heart sounds.  Pulmonary:     Effort: Pulmonary effort is normal.     Breath sounds: Normal breath sounds.  Abdominal:     General: Bowel sounds are normal.     Palpations: Abdomen is soft.  Skin:    General: Skin is warm and dry.  Neurological:     Mental Status: She is alert and oriented to person, place, and time.  Latest Ref Rng &  Units 06/12/2019   11:41 PM  CMP  Glucose 70 - 99 mg/dL 130   BUN 8 - 23 mg/dL 14   Creatinine 8.65 - 1.00 mg/dL 7.84   Sodium 696 - 295 mmol/L 136   Potassium 3.5 - 5.1 mmol/L 3.3   Chloride 98 - 111 mmol/L 99   CO2 22 - 32 mmol/L 26   Calcium 8.9 - 10.3 mg/dL 9.0   Total Protein 6.5 - 8.1 g/dL 7.9   Total Bilirubin 0.3 - 1.2 mg/dL 0.6   Alkaline Phos 38 - 126 U/L 149   AST 15 - 41 U/L 32   ALT 0 - 44 U/L 41       Latest Ref Rng & Units 06/12/2019   11:41 PM  CBC  WBC 4.0 - 10.5 K/uL 11.1   Hemoglobin 12.0 - 15.0 g/dL 28.4   Hematocrit 13.2 - 46.0 % 37.7   Platelets 150 - 400 K/uL 247      Assessment and plan- Patient is a 62 y.o. 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  She is here for routine follow-up of breast cancer  Clinically patient is doing well with no concerning signs and symptoms of recurrence based on today's exam.  Patient chose not to proceed with adjuvant chemotherapy or radiation therapy.  She is willing to consider adjuvant endocrine therapy at this time.  Patient had a bone density scan March 2024 which showed osteopenia with a T-score of -2.3 in the AP spine.  She is on chronic Fosamax.  Discussed risks and benefits of Arimidex including all but not limited to fatigue, arthralgias, mood swings, worsening bone health.  Patient understands and agrees to proceed as planned.  I will see her back in 2 months with labs.  Treatment will be given with a curative intent   Visit Diagnosis 1. Malignant neoplasm of upper-inner quadrant of left breast in female, estrogen receptor positive (HCC)   2. Goals of care, counseling/discussion      Dr. Owens Shark, MD, MPH [facility] at [facility] [phone] 08/16/2022 6:19 PM

## 2022-08-21 ENCOUNTER — Other Ambulatory Visit: Payer: Self-pay | Admitting: Psychiatry

## 2022-08-21 DIAGNOSIS — F250 Schizoaffective disorder, bipolar type: Secondary | ICD-10-CM

## 2022-08-26 ENCOUNTER — Ambulatory Visit (INDEPENDENT_AMBULATORY_CARE_PROVIDER_SITE_OTHER): Payer: Medicare HMO | Admitting: Psychiatry

## 2022-08-26 ENCOUNTER — Encounter: Payer: Self-pay | Admitting: Psychiatry

## 2022-08-26 VITALS — BP 109/71 | HR 80 | Temp 98.0°F | Ht 60.1 in | Wt 203.4 lb

## 2022-08-26 DIAGNOSIS — F17200 Nicotine dependence, unspecified, uncomplicated: Secondary | ICD-10-CM

## 2022-08-26 DIAGNOSIS — F5105 Insomnia due to other mental disorder: Secondary | ICD-10-CM

## 2022-08-26 DIAGNOSIS — F250 Schizoaffective disorder, bipolar type: Secondary | ICD-10-CM | POA: Diagnosis not present

## 2022-08-26 DIAGNOSIS — F4310 Post-traumatic stress disorder, unspecified: Secondary | ICD-10-CM

## 2022-08-26 DIAGNOSIS — F4010 Social phobia, unspecified: Secondary | ICD-10-CM

## 2022-08-26 MED ORDER — OLANZAPINE 5 MG PO TABS: 5.0000 mg | ORAL_TABLET | Freq: Every day | ORAL | 0 refills | Status: DC

## 2022-08-26 MED ORDER — BUPROPION HCL ER (XL) 300 MG PO TB24: 300.0000 mg | ORAL_TABLET | Freq: Every day | ORAL | 1 refills | Status: DC

## 2022-08-26 NOTE — Progress Notes (Signed)
BH MD OP Progress Note  08/26/2022 1:28 PM Patricia Good  MRN:  [id number]  Chief Complaint:  Chief Complaint  Patient presents with   Follow-up   Anxiety   Depression   HPI: Patricia Good is a 62 year old Caucasian female who has a history of schizoaffective disorder, PTSD, social anxiety disorder, COPD, vitamin B12 deficiency, psoriasis, history of malignant melanoma, hypothyroidism, osteoarthritis, stage Ib invasive mammary carcinoma of the left breast, ER/PR-positive,HER 2 negative diagnosed after she went through routine mammogram in October 2023, status post left-sided partial mastectomy currently on letrozole was evaluated in office today.  Patient today reports that she is currently doing fairly well on the current combination of psychotropic medications.  Reports she was able to stop the Abilify.  She is currently tolerating the olanzapine.  Denies any significant paranoia.  Reports mood symptoms are stable.  Patient also was able to taper off the nortriptyline.  Reports sleep is overall good on the olanzapine.  Patient denies any suicidality, homicidality or perceptual disturbances.  Patient was able to follow-up with oncologist.  I have reviewed notes per Dr.Rao dated 08/14/2022-patient was seen for trial of endocrine therapy.  Patient reports she is currently trying to be active in her community.  She reports there are lot of activities going on at her residence.  They have bingo, arts and crafts and other activities.  She tries to participate on and off.  She often gets bored when she sits alone at home.  Does not have a lot of friends.  She is motivated to work on it.  Patient denies any other concerns today.  Visit Diagnosis:    ICD-10-CM   1. Schizoaffective disorder, bipolar type (HCC)  F25.0 OLANZapine (ZYPREXA) 5 MG tablet    buPROPion (WELLBUTRIN XL) 300 MG 24 hr tablet    2. PTSD (post-traumatic stress disorder)  F43.10 buPROPion (WELLBUTRIN XL) 300 MG 24 hr tablet     3. Social anxiety disorder  F40.10     4. Insomnia due to mental condition  F51.05    Mood    5. Tobacco use disorder  F17.200       Past Psychiatric History: I have reviewed past psychiatric history from progress note on 07/16/2017.  Past Medical History:  Past Medical History:  Diagnosis Date   Abnormal thyroid stimulating hormone (TSH) level    Anemia    Anxiety    Asthma    Bipolar 1 disorder (HCC)    COPD (chronic obstructive pulmonary disease) (HCC)    Endometriosis    GERD (gastroesophageal reflux disease)    Hepatitis A    Hypertension    Hypothyroidism    Malignant neoplasm of upper-inner quadrant of left breast in female, 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   Pre-diabetes    PTSD (post-traumatic stress disorder)    Schizoaffective disorder (HCC)     Past Surgical History:  Procedure Laterality Date   BREAST BIOPSY Left 03/03/2022   Left breast stereo bx Ribbon Clip- path pending   BREAST BIOPSY Left 03/03/2022   MM LT BREAST BX W LOC DEV 1ST LESION IMAGE BX SPEC STEREO GUIDE 03/03/2022 [facility]   ECTOPIC PREGNANCY SURGERY     PART MASTECTOMY,RADIO FREQUENCY LOCALIZER,AXILLARY SENTINEL NODE BIOPSY Left 03/20/2022   Procedure: PART MASTECTOMY,RADIO FREQUENCY LOCALIZER,AXILLARY SENTINEL NODE BIOPSY;  Surgeon: Carolan Shiver, MD;  Location: [facility] ORS;  Service: General;  Laterality: Left;   TOTAL ABDOMINAL HYSTERECTOMY W/ BILATERAL SALPINGOOPHORECTOMY  1990    Family Psychiatric History: I  have reviewed family psychiatric history from progress note on 07/16/2017.  Family History:  Family History  Problem Relation Age of Onset   Hypertension Father    Diabetes Father    Heart disease Father    Blindness Father    Alcohol abuse Sister    Drug abuse Sister    Anxiety disorder Sister    Depression Sister    Obesity Brother    Arthritis Brother    Alcohol abuse Brother    Drug abuse Brother    Depression Brother    Breast cancer  Cousin    Clotting disorder Neg Hx     Social History: I have reviewed social history from progress note on 07/16/2017. Social History   Socioeconomic History   Marital status: Single    Spouse name: Not on file   Number of children: 0   Years of education: Not on file   Highest education level: Associate degree: occupational, Scientist, product/process development, or vocational program  Occupational History   Not on file  Tobacco Use   Smoking status: Every Day    Packs/day: .5    Types: Cigarettes    Start date: 01/22/1969   Smokeless tobacco: Never   Tobacco comments:    Continues to go down on number per day to 5-7 per day.   Vaping Use   Vaping Use: Never used  Substance and Sexual Activity   Alcohol use: Not Currently    Alcohol/week: 1.0 - 2.0 standard drink of alcohol    Types: 1 Glasses of wine per week    Comment: occasional   Drug use: No   Sexual activity: Not Currently  Other Topics Concern   Not on file  Social History Narrative   Lives alone   Social Determinants of Health   Financial Resource Strain: Medium Risk (06/15/2017)   Overall Financial Resource Strain (CARDIA)    Difficulty of Paying Living Expenses: Somewhat hard  Food Insecurity: Food Insecurity Present (06/15/2017)   Hunger Vital Sign    Worried About Running Out of Food in the Last Year: Sometimes true    Ran Out of Food in the Last Year: Sometimes true  Transportation Needs: No Transportation Needs (06/15/2017)   PRAPARE - Administrator, Civil Service (Medical): No    Lack of Transportation (Non-Medical): No  Physical Activity: Inactive (06/15/2017)   Exercise Vital Sign    Days of Exercise per Week: 0 days    Minutes of Exercise per Session: 0 min  Stress: Stress Concern Present (06/15/2017)   [employer] of Occupational Health - Occupational Stress Questionnaire    Feeling of Stress : Very much  Social Connections: Socially Isolated (06/15/2017)   Social Connection and Isolation Panel [NHANES]     Frequency of Communication with Friends and Family: Never    Frequency of Social Gatherings with Friends and Family: Never    Attends Religious Services: Never    Database administrator or Organizations: No    Attends Banker Meetings: Never    Marital Status: Divorced    Allergies:  Allergies  Allergen Reactions   Shellfish Allergy Anaphylaxis    Only to crab meat (non-imitation), not shrimp   Sulfa Antibiotics Hives, Anaphylaxis and Other (See Comments)   Sulfasalazine Anaphylaxis and Hives    Other reaction(s): Other (See Comments)   Lisinopril     unknown    Metabolic Disorder Labs: Lab Results  Component Value Date   HGBA1C 5.7 (H) 05/06/2020  MPG 116.89 05/06/2020   MPG 117 12/21/2017   Lab Results  Component Value Date   PROLACTIN 1.3 (L) 05/06/2020   PROLACTIN 1.2 (L) 07/12/2017   Lab Results  Component Value Date   CHOL 193 05/06/2020   TRIG 103 05/06/2020   HDL 56 05/06/2020   CHOLHDL 3.4 05/06/2020   VLDL 21 05/06/2020   LDLCALC 116 (H) 05/06/2020   LDLCALC 163 (H) 12/21/2017   Lab Results  Component Value Date   TSH 2.250 06/17/2021   TSH 2.143 05/06/2020    Therapeutic Level Labs: No results found for: "LITHIUM" No results found for: "VALPROATE" No results found for: "CBMZ"  Current Medications: Current Outpatient Medications  Medication Sig Dispense Refill   albuterol (VENTOLIN HFA) 108 (90 Base) MCG/ACT inhaler Inhale into the lungs.     alendronate (FOSAMAX) 70 MG tablet Take 70 mg by mouth once a week.     amLODipine (NORVASC) 10 MG tablet Take 10 mg by mouth daily.     atorvastatin (LIPITOR) 20 MG tablet Take 20 mg by mouth at bedtime.     B Complex-C (B-COMPLEX WITH VITAMIN C) tablet Take 1 tablet by mouth daily.     buPROPion (WELLBUTRIN XL) 300 MG 24 hr tablet Take 1 tablet (300 mg total) by mouth daily. 90 tablet 1   calcium-vitamin D (OSCAL WITH D) 500-200 MG-UNIT tablet Take 1 tablet by mouth daily with breakfast.      furosemide (LASIX) 20 MG tablet Take 20 mg by mouth daily.      hydrOXYzine (ATARAX) 10 MG tablet Take 2 tablets (20 mg total) by mouth at bedtime as needed. for sleep and anxiety (Patient not taking: Reported on 08/26/2022) 180 tablet 0   letrozole (FEMARA) 2.5 MG tablet Take 1 tablet (2.5 mg total) by mouth daily. 30 tablet 3   levothyroxine (SYNTHROID, LEVOTHROID) 112 MCG tablet      meloxicam (MOBIC) 15 MG tablet Take 1 tablet by mouth daily.     [START ON 09/18/2022] OLANZapine (ZYPREXA) 5 MG tablet Take 1 tablet (5 mg total) by mouth at bedtime. 90 tablet 0   omeprazole (PRILOSEC) 40 MG capsule Take 40 mg by mouth daily.      prazosin (MINIPRESS) 2 MG capsule Take 1 capsule (2 mg total) by mouth at bedtime. 90 capsule 1   PREVIDENT 5000 DRY MOUTH 1.1 % GEL dental gel Place onto teeth.     SPIRIVA HANDIHALER 18 MCG inhalation capsule      valACYclovir (VALTREX) 1000 MG tablet      No current facility-administered medications for this visit.     Musculoskeletal: Strength & Muscle Tone: within normal limits Gait & Station: normal Patient leans: N/A  Psychiatric Specialty Exam: Review of Systems  Psychiatric/Behavioral: Negative.      Blood pressure 109/71, pulse 80, temperature 98 F (36.7 C), temperature source Skin, height 5' 0.1" (1.527 m), weight 203 lb 6.4 oz (92.3 kg).Body mass index is 39.59 kg/m.  General Appearance: Casual  Eye Contact:  Good  Speech:  Normal Rate  Volume:  Normal  Mood:  Euthymic  Affect:  Congruent  Thought Process:  Goal Directed and Descriptions of Associations: Intact  Orientation:  Full (Time, Place, and Person)  Thought Content: Logical   Suicidal Thoughts:  No  Homicidal Thoughts:  No  Memory:  Immediate;   Fair Recent;   Fair Remote;   Poor  Judgement:  Fair  Insight:  Fair  Psychomotor Activity:  Normal  Concentration:  Concentration: Fair and Attention Span: Fair  Recall:  [employer] of Knowledge: Fair  Language: Fair   Akathisia:  No  Handed:  Right  AIMS (if indicated): done  Assets:  Communication Skills Desire for Improvement Housing Social Support  ADL's:  Intact  Cognition: WNL  Sleep:  Fair   Screenings: Midwife Visit from 08/26/2022 in [facility] Office Visit from 06/18/2022 in [facility] Office Visit from 05/06/2022 in [facility] Office Visit from 12/23/2021 in [facility] Office Visit from 11/24/2021 in [facility]  AIMS Total Score 0 0 0 0 0      AUDIT    Flowsheet Row Admission (Discharged) from 12/22/2017 in [facility]  Alcohol Use Disorder Identification Test Final Score (AUDIT) 1      GAD-7    Flowsheet Row Office Visit from 08/26/2022 in [facility] Office Visit from 06/18/2022 in [facility] Office Visit from 05/06/2022 in [facility] Office Visit from 12/23/2021 in [facility] Office Visit from 11/24/2021 in [facility]  Total GAD-7 Score 1 3 6 4 12       PHQ2-9    Flowsheet Row Office Visit from 08/26/2022 in [facility] Office Visit from 06/18/2022 in [facility] Office Visit from 05/06/2022 in [facility] Office Visit from 12/23/2021 in [facility] Office Visit from 11/24/2021 in [facility]  PHQ-2 Total Score 0 1 3 3 3   PHQ-9 Total Score 1 7 11 6 9       Flowsheet Row Office Visit from 06/18/2022 in [facility] ED from 06/05/2022 in [facility] Visit from 05/06/2022 in [facility]  C-SSRS RISK CATEGORY No Risk No Risk No Risk        Assessment and Plan: Patricia Good is a 62 year old Caucasian female, divorced, on SSD, currently lives in [place], has a history of schizoaffective disorder, social anxiety disorder, PTSD, recent diagnosis of breast cancer status post mastectomy currently on letrozole was evaluated in office today.  Patient is currently improving, will benefit from the following plan.  Plan Schizoaffective disorder-improving Continue olanzapine 5 mg p.o. nightly Continue Wellbutrin XL 300 mg p.o. daily  PTSD-improving Prazosin 2 mg p.o. nightly Hydroxyzine 10 to 20 mg p.o. nightly as needed Patient was advised to reestablish care with therapist.  Social anxiety disorder-improving Continue hydroxyzine 10 to 20 mg p.o. nightly as needed  Insomnia-stable Olanzapine 5 mg p.o. nightly helps with sleep also   Tobacco use disorder-unstable Will monitor closely.  Pending EKG-patient noncompliant.  Collaboration of Care: Collaboration of Care: Other I have reviewed notes per Dr. Rao-oncology as noted above.  Patient is currently on letrozole.  Patient/Guardian was advised Release of Information must be obtained prior to any record release in order to collaborate their care with an outside provider. Patient/Guardian was advised if they have not already done so to contact the registration department to sign all necessary forms in order for [place] to release information regarding their care.   Consent: Patient/Guardian gives verbal consent for treatment and assignment of benefits for  services provided during this visit. Patient/Guardian expressed understanding and agreed to proceed.   Follow-up in clinic in 2 months or sooner if needed.  This note was generated in part or  whole with voice recognition software. Voice recognition is usually quite accurate but there are transcription errors that can and very often do occur. I apologize for any typographical errors that were not detected and corrected.    Jomarie Longs, MD 08/26/2022, 1:28 PM

## 2022-10-16 ENCOUNTER — Inpatient Hospital Stay (HOSPITAL_BASED_OUTPATIENT_CLINIC_OR_DEPARTMENT_OTHER): Payer: Medicare HMO | Admitting: Oncology

## 2022-10-16 ENCOUNTER — Other Ambulatory Visit: Payer: Self-pay | Admitting: *Deleted

## 2022-10-16 ENCOUNTER — Encounter: Payer: Self-pay | Admitting: Oncology

## 2022-10-16 ENCOUNTER — Inpatient Hospital Stay: Payer: Medicare HMO | Attending: Oncology

## 2022-10-16 VITALS — BP 129/78 | HR 73 | Temp 96.9°F | Resp 19 | Wt 198.9 lb

## 2022-10-16 DIAGNOSIS — Z79899 Other long term (current) drug therapy: Secondary | ICD-10-CM | POA: Insufficient documentation

## 2022-10-16 DIAGNOSIS — F419 Anxiety disorder, unspecified: Secondary | ICD-10-CM | POA: Diagnosis not present

## 2022-10-16 DIAGNOSIS — Z5181 Encounter for therapeutic drug level monitoring: Secondary | ICD-10-CM | POA: Diagnosis not present

## 2022-10-16 DIAGNOSIS — F17210 Nicotine dependence, cigarettes, uncomplicated: Secondary | ICD-10-CM | POA: Diagnosis not present

## 2022-10-16 DIAGNOSIS — M8588 Other specified disorders of bone density and structure, other site: Secondary | ICD-10-CM | POA: Diagnosis not present

## 2022-10-16 DIAGNOSIS — Z170 Estrogen receptor positive status [ER+]: Secondary | ICD-10-CM

## 2022-10-16 DIAGNOSIS — Z79811 Long term (current) use of aromatase inhibitors: Secondary | ICD-10-CM | POA: Insufficient documentation

## 2022-10-16 DIAGNOSIS — C50212 Malignant neoplasm of upper-inner quadrant of left female breast: Secondary | ICD-10-CM

## 2022-10-16 DIAGNOSIS — F319 Bipolar disorder, unspecified: Secondary | ICD-10-CM | POA: Diagnosis not present

## 2022-10-16 DIAGNOSIS — F259 Schizoaffective disorder, unspecified: Secondary | ICD-10-CM | POA: Diagnosis not present

## 2022-10-16 DIAGNOSIS — F4310 Post-traumatic stress disorder, unspecified: Secondary | ICD-10-CM | POA: Diagnosis not present

## 2022-10-16 DIAGNOSIS — Z08 Encounter for follow-up examination after completed treatment for malignant neoplasm: Secondary | ICD-10-CM

## 2022-10-16 LAB — COMPREHENSIVE METABOLIC PANEL
ALT: 43 U/L (ref 0–44)
AST: 37 U/L (ref 15–41)
Albumin: 3.9 g/dL (ref 3.5–5.0)
Alkaline Phosphatase: 203 U/L — ABNORMAL HIGH (ref 38–126)
Anion gap: 7 (ref 5–15)
BUN: 13 mg/dL (ref 8–23)
CO2: 29 mmol/L (ref 22–32)
Calcium: 8.7 mg/dL — ABNORMAL LOW (ref 8.9–10.3)
Chloride: 94 mmol/L — ABNORMAL LOW (ref 98–111)
Creatinine, Ser: 1.13 mg/dL — ABNORMAL HIGH (ref 0.44–1.00)
GFR, Estimated: 54 mL/min — ABNORMAL LOW (ref >60)
Glucose, Bld: 110 mg/dL — ABNORMAL HIGH (ref 70–99)
Potassium: 4.2 mmol/L (ref 3.5–5.1)
Sodium: 130 mmol/L — ABNORMAL LOW (ref 135–145)
Total Bilirubin: 0.4 mg/dL (ref 0.3–1.2)
Total Protein: 7.8 g/dL (ref 6.5–8.1)

## 2022-10-16 NOTE — Progress Notes (Signed)
Orders in 

## 2022-10-16 NOTE — Progress Notes (Signed)
Hematology/Oncology Consult note [facility]  [phone] [phone]  Patient Care Team: Entzminger, Danton Clap, MD as PCP - General (Internal Medicine) Hulen Luster, RN as Oncology Nurse Navigator   Name of the patient: Patricia Good  [id number]  1960/12/07   Date of visit: 10/16/22  Diagnosis- pathological prognostic stage Ib invasive mammary carcinoma of the left breast pT1a N0 M0 ER/PR positive HER2 negative   Chief complaint/ Reason for visit-routine follow-up of breast cancer on letrozole  Heme/Onc history: Patient is a 62 year old female who underwent a routine screening bilateral mammogram in October 2023 which showed possible calcifications in the left breast. This was followed by a diagnostic mammogram which showed a 3 mm group of calcifications in the upper inner quadrant of the left breast and a biopsy was recommended. Core biopsy showed invasive mammary carcinoma 2 mm grade 2 ER greater than 90% positive PR greater than 90% positive and HER2 negative.    Patient has baseline osteoporosis and is currently on weekly Fosamax   Final pathology showed 4 mm grade 3 tumor with negative margins.  Given the high negative tumor Oncotype testing was sent and came back at high risk with a recurrence score of 28.  Patient declined adjuvant chemotherapy.  She started adjuvant radiation but stopped prematurely due to concern for side effects.  Patient started taking Arimidex in May 2024   Patient has a history of schizoaffective/bipolar disorder, PTSD and social anxiety disorder for which she follows up with psychiatry  Interval history-patient is tolerating letrozole well Without any significant side effects.  Reports no worsening joint pains or hot flashes.  ECOG PS- 1 Pain scale- 0   Review of systems- Review of Systems  Constitutional:  Positive for malaise/fatigue. Negative for chills, fever and weight loss.  HENT:  Negative for  congestion, ear discharge and nosebleeds.   Eyes:  Negative for blurred vision.  Respiratory:  Negative for cough, hemoptysis, sputum production, shortness of breath and wheezing.   Cardiovascular:  Negative for chest pain, palpitations, orthopnea and claudication.  Gastrointestinal:  Negative for abdominal pain, blood in stool, constipation, diarrhea, heartburn, melena, nausea and vomiting.  Genitourinary:  Negative for dysuria, flank pain, frequency, hematuria and urgency.  Musculoskeletal:  Negative for back pain, joint pain and myalgias.  Skin:  Negative for rash.  Neurological:  Negative for dizziness, tingling, focal weakness, seizures, weakness and headaches.  Endo/Heme/Allergies:  Does not bruise/bleed easily.  Psychiatric/Behavioral:  Negative for depression and suicidal ideas. The patient does not have insomnia.       Allergies  Allergen Reactions   Shellfish Allergy Anaphylaxis    Only to crab meat (non-imitation), not shrimp   Sulfa Antibiotics Hives, Anaphylaxis and Other (See Comments)   Sulfasalazine Anaphylaxis and Hives    Other reaction(s): Other (See Comments)   Lisinopril     unknown     Past Medical History:  Diagnosis Date   Abnormal thyroid stimulating hormone (TSH) level    Anemia    Anxiety    Asthma    Bipolar 1 disorder (HCC)    COPD (chronic obstructive pulmonary disease) (HCC)    Endometriosis    GERD (gastroesophageal reflux disease)    Hepatitis A    Hypertension    Hypothyroidism    Malignant neoplasm of upper-inner quadrant of left breast in female, 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   Pre-diabetes    PTSD (post-traumatic stress disorder)    Schizoaffective disorder (HCC)  Past Surgical History:  Procedure Laterality Date   BREAST BIOPSY Left 03/03/2022   Left breast stereo bx Ribbon Clip- path pending   BREAST BIOPSY Left 03/03/2022   MM LT BREAST BX W LOC DEV 1ST LESION IMAGE BX SPEC STEREO GUIDE 03/03/2022  [facility]   ECTOPIC PREGNANCY SURGERY     PART MASTECTOMY,RADIO FREQUENCY LOCALIZER,AXILLARY SENTINEL NODE BIOPSY Left 03/20/2022   Procedure: PART MASTECTOMY,RADIO FREQUENCY LOCALIZER,AXILLARY SENTINEL NODE BIOPSY;  Surgeon: Carolan Shiver, MD;  Location: [facility] ORS;  Service: General;  Laterality: Left;   TOTAL ABDOMINAL HYSTERECTOMY W/ BILATERAL SALPINGOOPHORECTOMY  1990    Social History   Socioeconomic History   Marital status: Single    Spouse name: Not on file   Number of children: 0   Years of education: Not on file   Highest education level: Associate degree: occupational, Scientist, product/process development, or vocational program  Occupational History   Not on file  Tobacco Use   Smoking status: Every Day    Current packs/day: 0.50    Average packs/day: 0.5 packs/day for 53.7 years (26.9 ttl pk-yrs)    Types: Cigarettes    Start date: 01/22/1969   Smokeless tobacco: Never   Tobacco comments:    Continues to go down on number per day to 5-7 per day.   Vaping Use   Vaping status: Never Used  Substance and Sexual Activity   Alcohol use: Not Currently    Alcohol/week: 1.0 - 2.0 standard drink of alcohol    Types: 1 Glasses of wine per week    Comment: occasional   Drug use: No   Sexual activity: Not Currently  Other Topics Concern   Not on file  Social History Narrative   Lives alone   Social Determinants of Health   Financial Resource Strain: Medium Risk (06/15/2017)   Overall Financial Resource Strain (CARDIA)    Difficulty of Paying Living Expenses: Somewhat hard  Food Insecurity: Food Insecurity Present (06/15/2017)   Hunger Vital Sign    Worried About Running Out of Food in the Last Year: Sometimes true    Ran Out of Food in the Last Year: Sometimes true  Transportation Needs: No Transportation Needs (06/15/2017)   PRAPARE - Administrator, Civil Service (Medical): No    Lack of Transportation (Non-Medical): No  Physical Activity: Inactive (06/15/2017)    Exercise Vital Sign    Days of Exercise per Week: 0 days    Minutes of Exercise per Session: 0 min  Stress: Stress Concern Present (06/15/2017)   [employer] of Occupational Health - Occupational Stress Questionnaire    Feeling of Stress : Very much  Social Connections: Socially Isolated (06/15/2017)   Social Connection and Isolation Panel [NHANES]    Frequency of Communication with Friends and Family: Never    Frequency of Social Gatherings with Friends and Family: Never    Attends Religious Services: Never    Database administrator or Organizations: No    Attends Banker Meetings: Never    Marital Status: Divorced  Catering manager Violence: Not At Risk (06/15/2017)   Humiliation, Afraid, Rape, and Kick questionnaire    Fear of Current or Ex-Partner: No    Emotionally Abused: No    Physically Abused: No    Sexually Abused: No    Family History  Problem Relation Age of Onset   Hypertension Father    Diabetes Father    Heart disease Father    Blindness Father    Alcohol abuse Sister  Drug abuse Sister    Anxiety disorder Sister    Depression Sister    Obesity Brother    Arthritis Brother    Alcohol abuse Brother    Drug abuse Brother    Depression Brother    Breast cancer Cousin    Clotting disorder Neg Hx      Current Outpatient Medications:    albuterol (VENTOLIN HFA) 108 (90 Base) MCG/ACT inhaler, Inhale into the lungs., Disp: , Rfl:    alendronate (FOSAMAX) 70 MG tablet, Take 70 mg by mouth once a week., Disp: , Rfl:    amLODipine (NORVASC) 10 MG tablet, Take 10 mg by mouth daily., Disp: , Rfl:    atorvastatin (LIPITOR) 20 MG tablet, Take 20 mg by mouth at bedtime., Disp: , Rfl:    B Complex-C (B-COMPLEX WITH VITAMIN C) tablet, Take 1 tablet by mouth daily., Disp: , Rfl:    buPROPion (WELLBUTRIN XL) 300 MG 24 hr tablet, Take 1 tablet (300 mg total) by mouth daily., Disp: 90 tablet, Rfl: 1   calcium-vitamin D (OSCAL WITH D) 500-200 MG-UNIT tablet,  Take 1 tablet by mouth daily with breakfast., Disp: , Rfl:    furosemide (LASIX) 20 MG tablet, Take 20 mg by mouth daily. , Disp: , Rfl:    letrozole (FEMARA) 2.5 MG tablet, Take 1 tablet (2.5 mg total) by mouth daily., Disp: 30 tablet, Rfl: 3   levothyroxine (SYNTHROID, LEVOTHROID) 112 MCG tablet, , Disp: , Rfl:    meloxicam (MOBIC) 15 MG tablet, Take 1 tablet by mouth daily., Disp: , Rfl:    OLANZapine (ZYPREXA) 5 MG tablet, Take 1 tablet (5 mg total) by mouth at bedtime., Disp: 90 tablet, Rfl: 0   omeprazole (PRILOSEC) 40 MG capsule, Take 40 mg by mouth daily. , Disp: , Rfl:    prazosin (MINIPRESS) 2 MG capsule, Take 1 capsule (2 mg total) by mouth at bedtime., Disp: 90 capsule, Rfl: 1   PREVIDENT 5000 DRY MOUTH 1.1 % GEL dental gel, Place onto teeth., Disp: , Rfl:    SPIRIVA HANDIHALER 18 MCG inhalation capsule, , Disp: , Rfl:    valACYclovir (VALTREX) 1000 MG tablet, , Disp: , Rfl:    hydrOXYzine (ATARAX) 10 MG tablet, Take 2 tablets (20 mg total) by mouth at bedtime as needed. for sleep and anxiety (Patient not taking: Reported on 08/26/2022), Disp: 180 tablet, Rfl: 0  Physical exam:  Vitals:   10/16/22 1031  BP: 129/78  Pulse: 73  Resp: 19  Temp: (!) 96.9 F (36.1 C)  SpO2: 99%  Weight: 198 lb 14.4 oz (90.2 kg)   Physical Exam Cardiovascular:     Rate and Rhythm: Normal rate and regular rhythm.     Heart sounds: Normal heart sounds.  Pulmonary:     Effort: Pulmonary effort is normal.     Breath sounds: Normal breath sounds.  Abdominal:     General: Bowel sounds are normal.     Palpations: Abdomen is soft.  Skin:    General: Skin is warm and dry.  Neurological:     Mental Status: She is alert and oriented to person, place, and time.    Breast exam was performed in seated and lying down position. Patient is status post left lumpectomy with a well-healed surgical scar. No evidence of any palpable masses. No evidence of axillary adenopathy. No evidence of any palpable masses  or lumps in the right breast. No evidence of right axillary adenopathy      Latest Ref  Rng & Units 10/16/2022   10:09 AM  CMP  Glucose 70 - 99 mg/dL 485   BUN 8 - 23 mg/dL 13   Creatinine 4.62 - 1.00 mg/dL 7.03   Sodium 500 - 938 mmol/L 130   Potassium 3.5 - 5.1 mmol/L 4.2   Chloride 98 - 111 mmol/L 94   CO2 22 - 32 mmol/L 29   Calcium 8.9 - 10.3 mg/dL 8.7   Total Protein 6.5 - 8.1 g/dL 7.8   Total Bilirubin 0.3 - 1.2 mg/dL 0.4   Alkaline Phos 38 - 126 U/L 203   AST 15 - 41 U/L 37   ALT 0 - 44 U/L 43       Latest Ref Rng & Units 06/12/2019   11:41 PM  CBC  WBC 4.0 - 10.5 K/uL 11.1   Hemoglobin 12.0 - 15.0 g/dL 18.2   Hematocrit 99.3 - 46.0 % 37.7   Platelets 150 - 400 K/uL 247      Assessment and plan- Patient is a 62 y.o. female   with pathological prognostic stage Ia invasive mammary carcinoma of the left breast pT1a N0 M0 ER/PR positive HER2 negative.  She is currently on letrozole and this is a routine follow-up visit  Clinically patient is doing well with no concerning signs and symptoms of recurrence based on today's exam.  She is tolerating letrozole well without any significant side effects.  I will see her back in 6 months no labs.  She is due for a mammogram in November 2024 which I will schedule.  Her baseline bone density scan shows osteopenia with a T-score of -2.3 in the AP spine.  She is on weekly Fosamax which she will continue along with calcium and vitamin D.   Visit Diagnosis 1. Encounter for follow-up surveillance of breast cancer   2. Visit for monitoring Arimidex therapy   3. Osteopenia of lumbar spine      Dr. Owens Shark, MD, MPH [facility] at [facility] [phone] 10/16/2022 2:52 PM

## 2022-10-20 IMAGING — US US RENAL
1 series · 14 of 25 positions shown · non-contrast
Comparison: CT dated November 03, 2011

CLINICAL DATA: Stage 3a chronic kidney disease (CKD) (HCC)

EXAM:
RENAL / URINARY TRACT ULTRASOUND COMPLETE

[Series 1: us renal · 0.28mm/px · 14 of 40 slices shown]
[im 1/40]
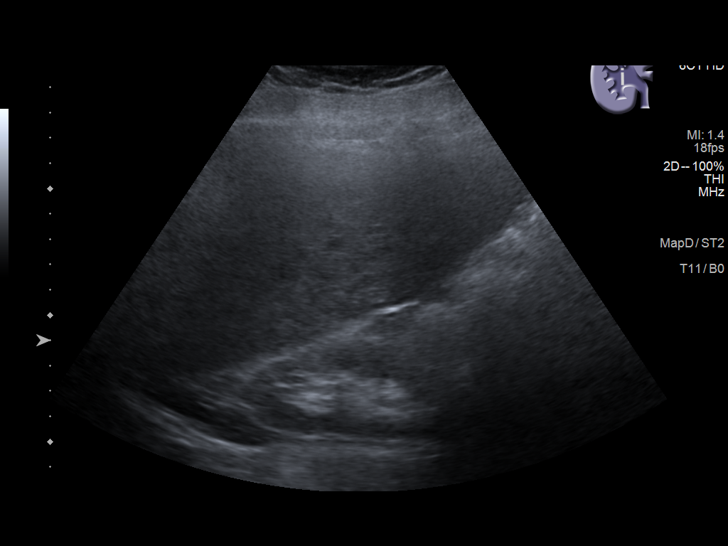
[im 4/40]
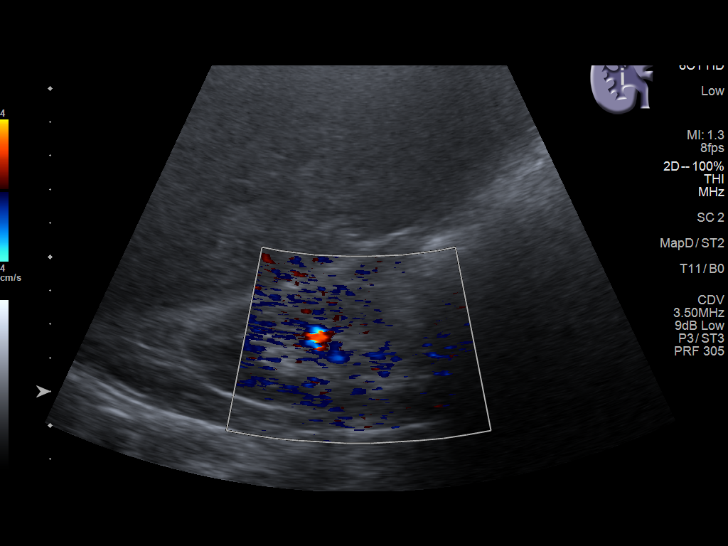
[im 7/40]
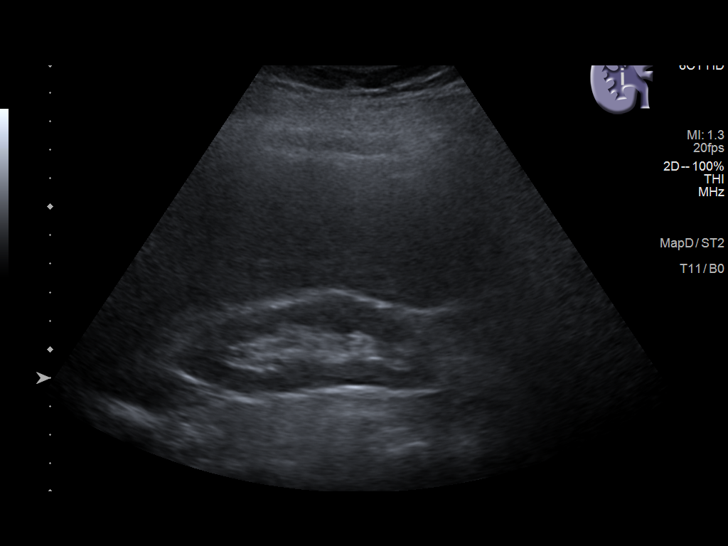
[im 10/40]
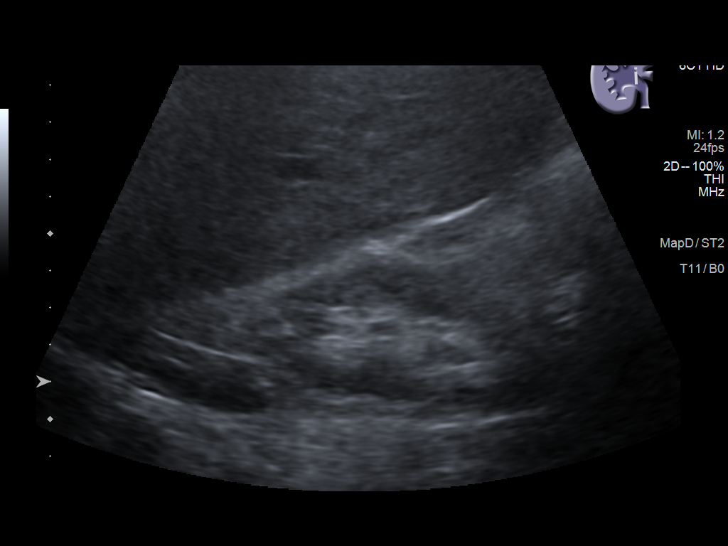
[im 14/40]
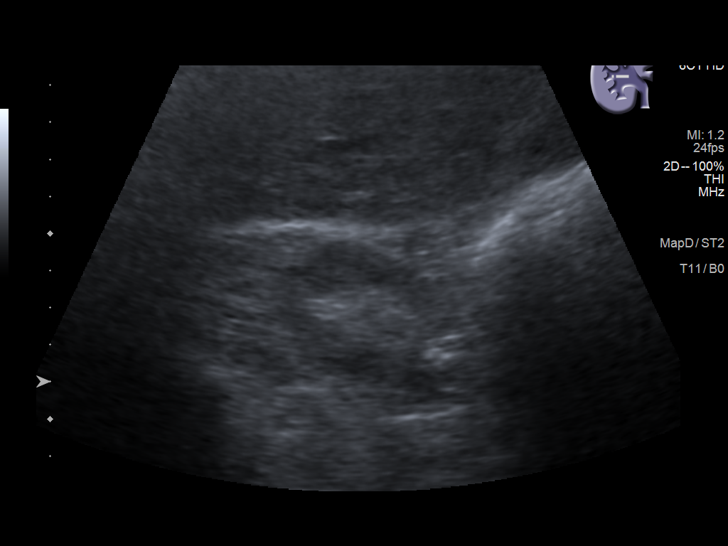
[im 15/40]
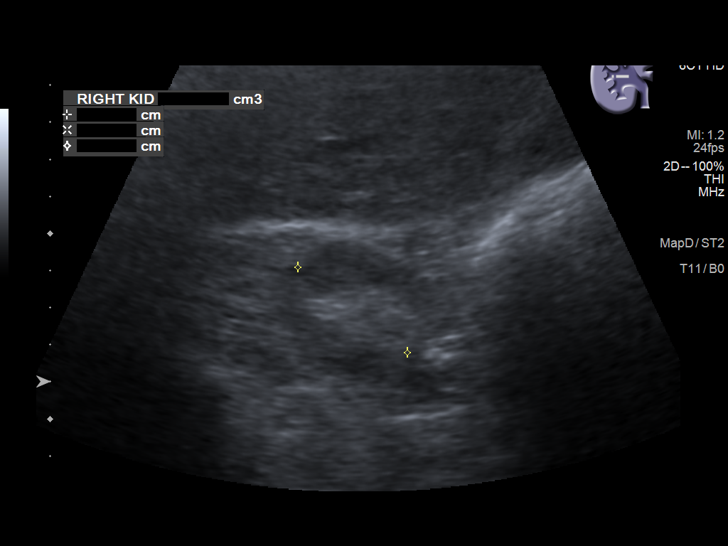
[im 18/40]
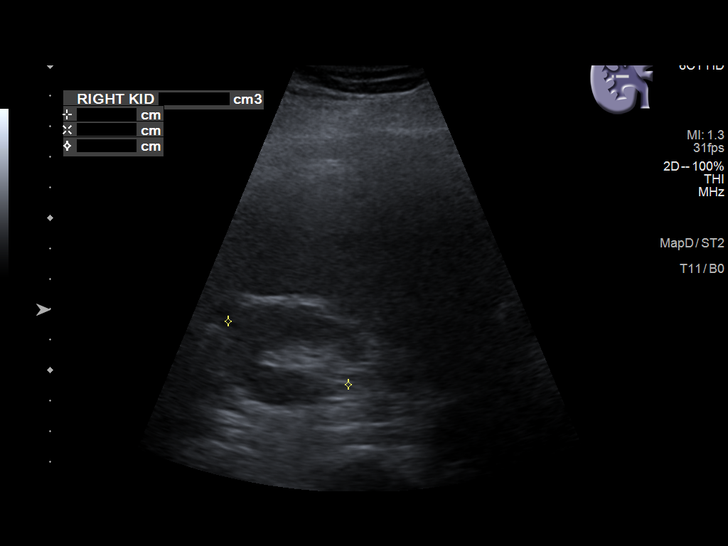
[im 22/40]
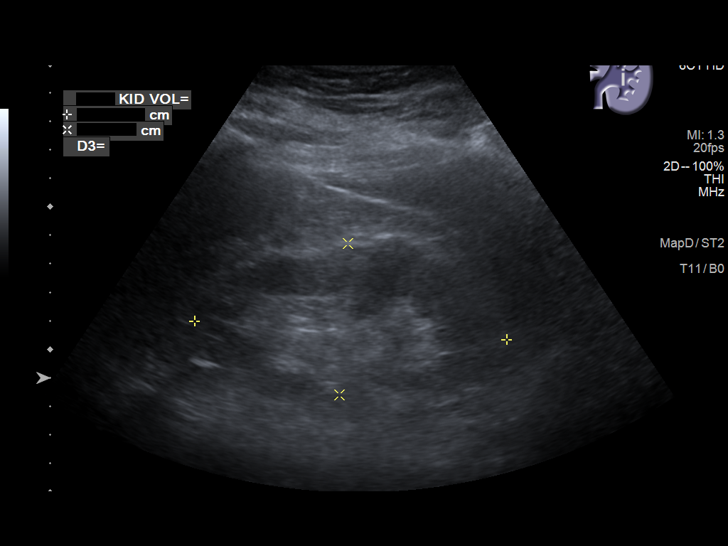
[im 25/40]
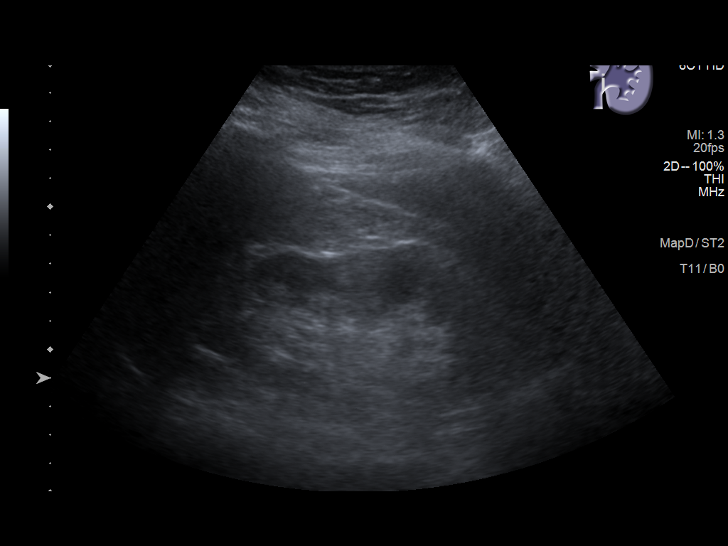
[im 27/40]
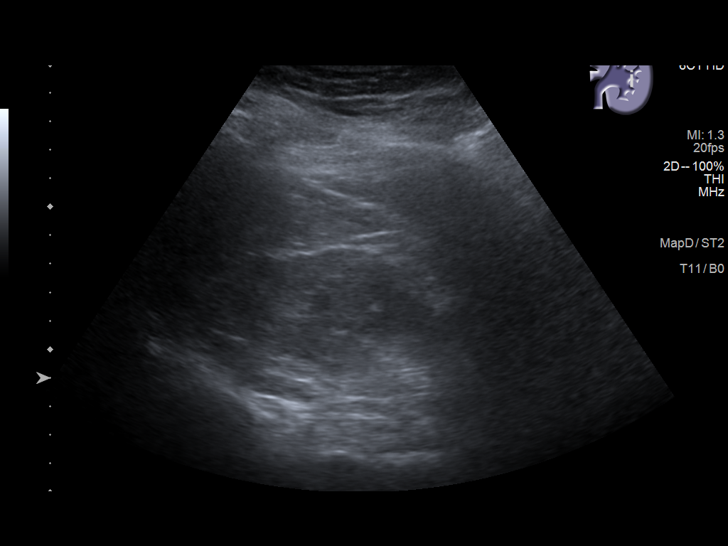
[im 30/40]
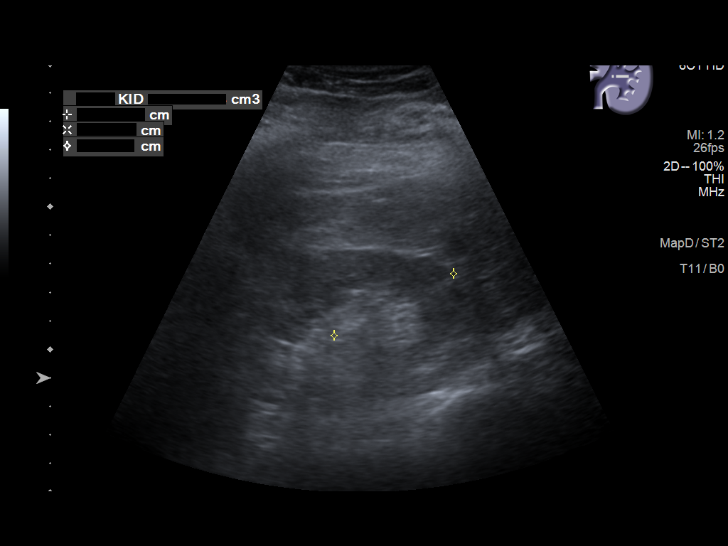
[im 33/40]
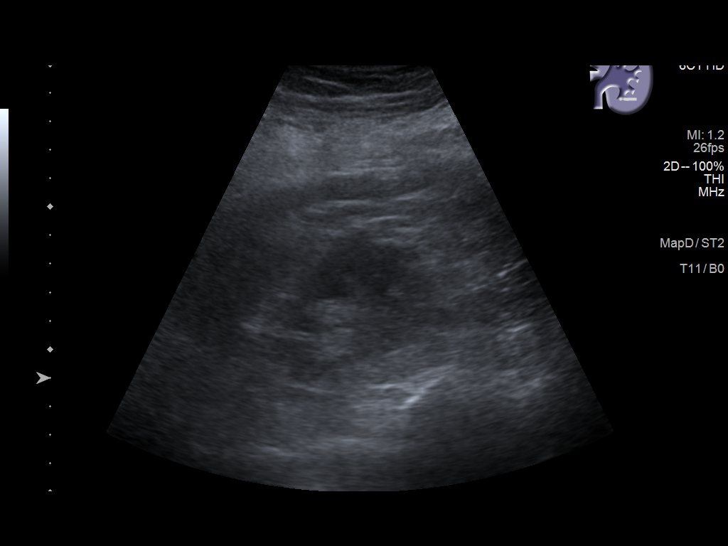
[im 36/40]
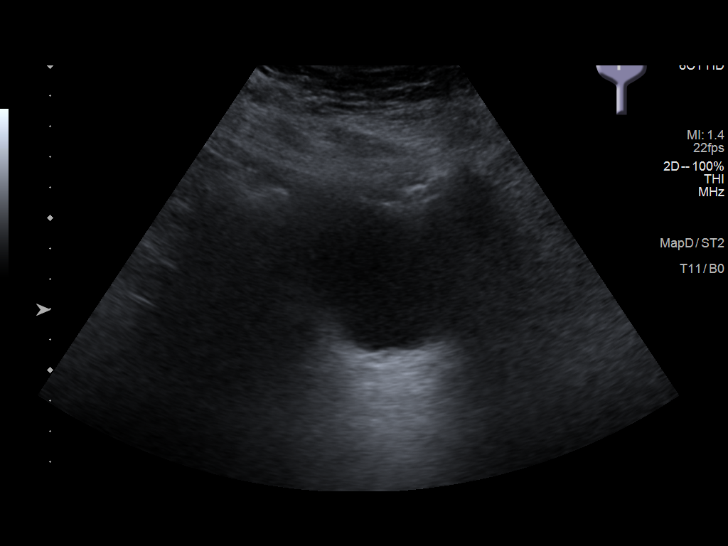
[im 40/40]
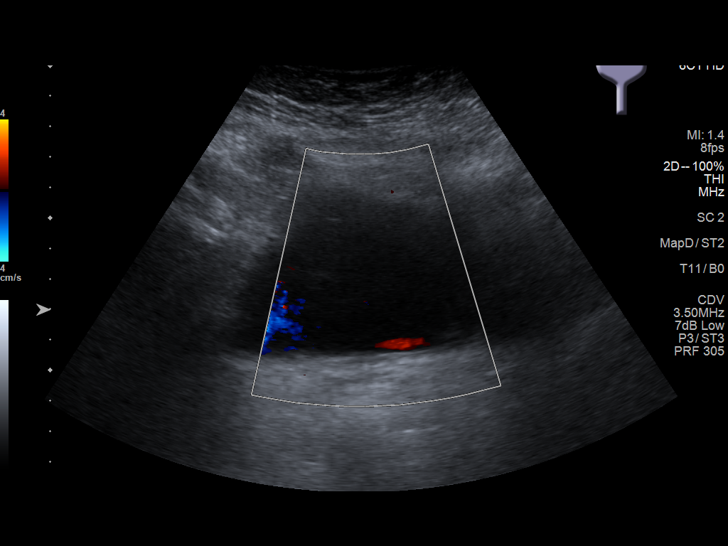

[14 of 25 positions shown; findings below may reference images not displayed]

FINDINGS: Limited evaluation secondary to body habitus and limited acoustic
windows.

Right Kidney:

Renal measurements: 9.6 x 3.5 x 4.5 cm = volume: 80 mL. Echogenicity
within normal limits. No mass or hydronephrosis visualized.

Left Kidney:

Renal measurements: 11.0 x 5.3 x 4.7 cm = volume: 144 mL.
Echogenicity within normal limits. No mass or hydronephrosis
visualized.

Bladder:

Appears normal for degree of bladder distention.

Other:

None.
IMPRESSION: No hydronephrosis.

## 2022-10-28 ENCOUNTER — Ambulatory Visit: Payer: Medicare HMO | Admitting: Psychiatry

## 2022-11-12 ENCOUNTER — Telehealth: Payer: Self-pay | Admitting: Psychiatry

## 2022-11-12 NOTE — Telephone Encounter (Signed)
Please let her know I need to evaluate her and letter can be discussed at her visit.

## 2022-11-12 NOTE — Telephone Encounter (Signed)
Patient put on list for sooner appointment. She is requesting a letter from you stating that she would benefit moving to a lower level apartment. She is currently on the 6th floor.

## 2022-11-13 NOTE — Telephone Encounter (Signed)
Noted  

## 2022-12-09 ENCOUNTER — Ambulatory Visit: Payer: Medicare HMO | Admitting: Psychiatry

## 2022-12-15 ENCOUNTER — Other Ambulatory Visit: Payer: Self-pay | Admitting: Oncology

## 2022-12-15 ENCOUNTER — Other Ambulatory Visit: Payer: Self-pay | Admitting: Psychiatry

## 2022-12-15 DIAGNOSIS — F250 Schizoaffective disorder, bipolar type: Secondary | ICD-10-CM

## 2023-01-13 ENCOUNTER — Encounter: Payer: Self-pay | Admitting: Psychiatry

## 2023-01-13 ENCOUNTER — Ambulatory Visit (INDEPENDENT_AMBULATORY_CARE_PROVIDER_SITE_OTHER): Payer: Medicare HMO | Admitting: Psychiatry

## 2023-01-13 VITALS — BP 125/71 | HR 88 | Temp 98.6°F | Ht 60.1 in | Wt 200.4 lb

## 2023-01-13 DIAGNOSIS — F4310 Post-traumatic stress disorder, unspecified: Secondary | ICD-10-CM | POA: Diagnosis not present

## 2023-01-13 DIAGNOSIS — F250 Schizoaffective disorder, bipolar type: Secondary | ICD-10-CM | POA: Diagnosis not present

## 2023-01-13 DIAGNOSIS — F4010 Social phobia, unspecified: Secondary | ICD-10-CM

## 2023-01-13 DIAGNOSIS — Z79899 Other long term (current) drug therapy: Secondary | ICD-10-CM

## 2023-01-13 DIAGNOSIS — F17200 Nicotine dependence, unspecified, uncomplicated: Secondary | ICD-10-CM

## 2023-01-13 DIAGNOSIS — F5105 Insomnia due to other mental disorder: Secondary | ICD-10-CM

## 2023-01-13 MED ORDER — HYDROXYZINE HCL 10 MG PO TABS: 20.0000 mg | ORAL_TABLET | Freq: Every evening | ORAL | 0 refills | Status: DC | PRN

## 2023-01-13 MED ORDER — PRAZOSIN HCL 2 MG PO CAPS: 2.0000 mg | ORAL_CAPSULE | Freq: Every day | ORAL | 1 refills | Status: DC

## 2023-01-13 NOTE — Progress Notes (Unsigned)
BH MD OP Progress Note  01/13/2023 5:20 PM Patricia Good  MRN:  [id number]  Chief Complaint:  Chief Complaint  Patient presents with   Follow-up   Anxiety   Depression   Psychosis   Medication Refill   HPI: Patricia Good is a 62 year old Caucasian female who has a history of schizoaffective disorder, PTSD, social anxiety disorder, COPD, vitamin B12 deficiency, psoriasis, history of malignant melanoma, hypothyroidism, osteoarthritis, stage Ib invasive mammary carcinoma of the left breast, ER/PR-positive,HER 2 negative, status post left-sided partial mastectomy currently on letrozole was evaluated in office today.  Patient today reports she is currently doing fairly well on the current medication regimen.  Denies any significant anxiety.  Denies any sadness or lack of motivation.  She reports sleep is overall okay.  Currently denies any nightmares.  She reports she tries to stay away from her family members due to interpersonal conflicts.  She however does have a few acquaintances that she associates with.  Patient reports she would like to try to get housing at a lower level at her apartment complex.  She however would like to get a letter to give it to her housing manager to support that.  Patient currently reports she is compliant on all her medications.  Denies any side effects.  She denies any suicidality, homicidality.  Patient denies any perceptual disturbances, did not appear to be preoccupied with any delusions or paranoia.  Patient denies any other concerns today.  Visit Diagnosis:    ICD-10-CM   1. Schizoaffective disorder, bipolar type (HCC)  F25.0 hydrOXYzine (ATARAX) 10 MG tablet    2. PTSD (post-traumatic stress disorder)  F43.10 prazosin (MINIPRESS) 2 MG capsule    hydrOXYzine (ATARAX) 10 MG tablet    3. Social anxiety disorder  F40.10     4. Insomnia due to mental condition  F51.05    Mood    5. High risk medication use  Z79.899     6. Tobacco use disorder   F17.200       Past Psychiatric History: I have reviewed past psychiatric history from progress note on 07/16/2017.  Past Medical History:  Past Medical History:  Diagnosis Date   Abnormal thyroid stimulating hormone (TSH) level    Anemia    Anxiety    Asthma    Bipolar 1 disorder (HCC)    COPD (chronic obstructive pulmonary disease) (HCC)    Endometriosis    GERD (gastroesophageal reflux disease)    Hepatitis A    Hypertension    Hypothyroidism    Malignant neoplasm of upper-inner quadrant of left breast in female, 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   Pre-diabetes    PTSD (post-traumatic stress disorder)    Schizoaffective disorder (HCC)     Past Surgical History:  Procedure Laterality Date   BREAST BIOPSY Left 03/03/2022   Left breast stereo bx Ribbon Clip- path pending   BREAST BIOPSY Left 03/03/2022   MM LT BREAST BX W LOC DEV 1ST LESION IMAGE BX SPEC STEREO GUIDE 03/03/2022 [facility]   ECTOPIC PREGNANCY SURGERY     PART MASTECTOMY,RADIO FREQUENCY LOCALIZER,AXILLARY SENTINEL NODE BIOPSY Left 03/20/2022   Procedure: PART MASTECTOMY,RADIO FREQUENCY LOCALIZER,AXILLARY SENTINEL NODE BIOPSY;  Surgeon: Carolan Shiver, MD;  Location: [facility];  Service: General;  Laterality: Left;   TOTAL ABDOMINAL HYSTERECTOMY W/ BILATERAL SALPINGOOPHORECTOMY  1990    Family Psychiatric History: I have reviewed family psychiatric history from progress note on 07/16/2017.  Family History:  Family History  Problem Relation Age of  Onset   Hypertension Father    Diabetes Father    Heart disease Father    Blindness Father    Alcohol abuse Sister    Drug abuse Sister    Anxiety disorder Sister    Depression Sister    Obesity Brother    Arthritis Brother    Alcohol abuse Brother    Drug abuse Brother    Depression Brother    Breast cancer Cousin    Clotting disorder Neg Hx     Social History: I have reviewed social history from progress note on 07/16/2017. Social  History   Socioeconomic History   Marital status: Single    Spouse name: Not on file   Number of children: 0   Years of education: Not on file   Highest education level: Associate degree: occupational, Scientist, product/process development, or vocational program  Occupational History   Not on file  Tobacco Use   Smoking status: Every Day    Current packs/day: 0.50    Average packs/day: 0.5 packs/day for 54.0 years (27.0 ttl pk-yrs)    Types: Cigarettes    Start date: 01/22/1969   Smokeless tobacco: Never   Tobacco comments:    Continues to go down on number per day to 5-7 per day.   Vaping Use   Vaping status: Never Used  Substance and Sexual Activity   Alcohol use: Not Currently    Alcohol/week: 1.0 - 2.0 standard drink of alcohol    Types: 1 Glasses of wine per week    Comment: occasional   Drug use: No   Sexual activity: Not Currently  Other Topics Concern   Not on file  Social History Narrative   Lives alone   Social Determinants of Health   Financial Resource Strain: Medium Risk (06/15/2017)   Overall Financial Resource Strain (CARDIA)    Difficulty of Paying Living Expenses: Somewhat hard  Food Insecurity: Food Insecurity Present (06/15/2017)   Hunger Vital Sign    Worried About Running Out of Food in the Last Year: Sometimes true    Ran Out of Food in the Last Year: Sometimes true  Transportation Needs: No Transportation Needs (06/15/2017)   PRAPARE - Administrator, Civil Service (Medical): No    Lack of Transportation (Non-Medical): No  Physical Activity: Inactive (06/15/2017)   Exercise Vital Sign    Days of Exercise per Week: 0 days    Minutes of Exercise per Session: 0 min  Stress: Stress Concern Present (06/15/2017)   [employer] of Occupational Health - Occupational Stress Questionnaire    Feeling of Stress : Very much  Social Connections: Socially Isolated (06/15/2017)   Social Connection and Isolation Panel [NHANES]    Frequency of Communication with Friends and  Family: Never    Frequency of Social Gatherings with Friends and Family: Never    Attends Religious Services: Never    Database administrator or Organizations: No    Attends Banker Meetings: Never    Marital Status: Divorced    Allergies:  Allergies  Allergen Reactions   Shellfish Allergy Anaphylaxis    Only to crab meat (non-imitation), not shrimp   Sulfa Antibiotics Hives, Anaphylaxis and Other (See Comments)   Sulfasalazine Anaphylaxis and Hives    Other reaction(s): Other (See Comments)   Lisinopril     unknown    Metabolic Disorder Labs: Lab Results  Component Value Date   HGBA1C 5.7 (H) 05/06/2020   MPG 116.89 05/06/2020   MPG 117  12/21/2017   Lab Results  Component Value Date   PROLACTIN 1.3 (L) 05/06/2020   PROLACTIN 1.2 (L) 07/12/2017   Lab Results  Component Value Date   CHOL 193 05/06/2020   TRIG 103 05/06/2020   HDL 56 05/06/2020   CHOLHDL 3.4 05/06/2020   VLDL 21 05/06/2020   LDLCALC 116 (H) 05/06/2020   LDLCALC 163 (H) 12/21/2017   Lab Results  Component Value Date   TSH 2.250 06/17/2021   TSH 2.143 05/06/2020    Therapeutic Level Labs: No results found for: "LITHIUM" No results found for: "VALPROATE" No results found for: "CBMZ"  Current Medications: Current Outpatient Medications  Medication Sig Dispense Refill   albuterol (VENTOLIN HFA) 108 (90 Base) MCG/ACT inhaler Inhale into the lungs.     alendronate (FOSAMAX) 70 MG tablet Take 70 mg by mouth once a week.     amLODipine (NORVASC) 10 MG tablet Take 10 mg by mouth daily.     atorvastatin (LIPITOR) 20 MG tablet Take 20 mg by mouth at bedtime.     B Complex-C (B-COMPLEX WITH VITAMIN C) tablet Take 1 tablet by mouth daily.     buPROPion (WELLBUTRIN XL) 300 MG 24 hr tablet Take 1 tablet (300 mg total) by mouth daily. 90 tablet 1   calcium-vitamin D (OSCAL WITH D) 500-200 MG-UNIT tablet Take 1 tablet by mouth daily with breakfast.     furosemide (LASIX) 20 MG tablet Take 20 mg  by mouth daily.      letrozole (FEMARA) 2.5 MG tablet TAKE 1 TABLET BY MOUTH DAILY 30 tablet 3   levothyroxine (SYNTHROID, LEVOTHROID) 112 MCG tablet      meloxicam (MOBIC) 15 MG tablet Take 1 tablet by mouth daily.     OLANZapine (ZYPREXA) 5 MG tablet TAKE 1 TABLET BY MOUTH AT BEDTIME 90 tablet 0   omeprazole (PRILOSEC) 40 MG capsule Take 40 mg by mouth daily.      PREVIDENT 5000 DRY MOUTH 1.1 % GEL dental gel Place onto teeth.     SPIRIVA HANDIHALER 18 MCG inhalation capsule      valACYclovir (VALTREX) 1000 MG tablet      hydrOXYzine (ATARAX) 10 MG tablet Take 2 tablets (20 mg total) by mouth at bedtime as needed. for sleep and anxiety 180 tablet 0   prazosin (MINIPRESS) 2 MG capsule Take 1 capsule (2 mg total) by mouth at bedtime. 90 capsule 1   No current facility-administered medications for this visit.     Musculoskeletal: Strength & Muscle Tone: within normal limits Gait & Station: normal Patient leans: N/A  Psychiatric Specialty Exam: Review of Systems  Psychiatric/Behavioral: Negative.      Blood pressure 125/71, pulse 88, temperature 98.6 F (37 C), temperature source Skin, height 5' 0.1" (1.527 m), weight 200 lb 6.4 oz (90.9 kg).Body mass index is 39.01 kg/m.  General Appearance: Fairly Groomed  Eye Contact:  Fair  Speech:  Normal Rate  Volume:  Normal  Mood:  Euthymic  Affect:  Congruent  Thought Process:  Goal Directed and Descriptions of Associations: Intact  Orientation:  Full (Time, Place, and Person)  Thought Content: Logical   Suicidal Thoughts:  No  Homicidal Thoughts:  No  Memory:  Immediate;   Fair Recent;   Fair Remote;   Fair  Judgement:  Fair  Insight:  Fair  Psychomotor Activity:  Normal  Concentration:  Concentration: Fair and Attention Span: Fair  Recall:  [employer] of Knowledge: Fair  Language: Fair  Akathisia:  No  Handed:  Right  AIMS (if indicated): done  Assets:  Communication Skills Desire for Improvement Housing Social Support   ADL's:  Intact  Cognition: WNL  Sleep:  Fair   Screenings: Midwife Visit from 01/13/2023 in [facility] Office Visit from 08/26/2022 in [facility] Office Visit from 06/18/2022 in [facility] Office Visit from 05/06/2022 in [facility] Office Visit from 12/23/2021 in [facility]  AIMS Total Score 0 0 0 0 0      AUDIT    Flowsheet Row Admission (Discharged) from 12/22/2017 in [facility]  Alcohol Use Disorder Identification Test Final Score (AUDIT) 1      GAD-7    Flowsheet Row Office Visit from 08/26/2022 in [facility] Office Visit from 06/18/2022 in [facility] Office Visit from 05/06/2022 in [facility] Office Visit from 12/23/2021 in [facility] Office Visit from 11/24/2021 in [facility]  Total GAD-7 Score 1 3 6 4 12       PHQ2-9    Flowsheet Row Office Visit from 08/26/2022 in [facility] Office Visit from 06/18/2022 in [facility] Office Visit from 05/06/2022 in [facility] Office Visit from 12/23/2021 in [facility] Office Visit from 11/24/2021 in [facility]  PHQ-2 Total Score 0 1 3 3 3   PHQ-9 Total Score 1 7 11 6 9       Flowsheet Row Office Visit from 01/13/2023 in [facility] Office Visit from 06/18/2022 in [facility] ED from 06/05/2022 in [facility] Emergency  Department at [facility]  C-SSRS RISK CATEGORY No Risk No Risk No Risk        Assessment and Plan: Patricia Good is a 62 year old Caucasian female, divorced, on SSD, currently lives in [place], has a history of schizoaffective disorder, social anxiety, PTSD,  breast cancer status post mastectomy currently on letrozole was evaluated in office today.  Patient is currently stable on the current medication regimen.  Plan as noted below.  Plan Schizoaffective disorder-stable Continue olanzapine 5 mg p.o. nightly Wellbutrin XL 300 mg p.o. daily  PTSD-improving Prazosin 2 mg p.o. nightly Hydroxyzine 10 -20 mg p.o. nightly as needed  Social anxiety disorder-improving Hydroxyzine 10 to 20 mg p.o. nightly as needed  Insomnia-stable Olanzapine 5 mg p.o. nightly which will also help with sleep.  Tobacco use disorder-improving Provided counseling for 1 minute.  High risk medication use-patient will benefit from lipid panel, hemoglobin A1c, prolactin, repeat sodium level as well as TSH level patient has upcoming appointment with primary care provider next week and agrees to get it done.  Otherwise we will consider repeating it when she returns for her visit.  At risk for prolonged QT syndrome-pending EKG-patient encouraged to get it done.  I have printed out a letter supporting her need to stay at a lower level at her apartment complex and provided to patient.  Collaboration of Care: Collaboration of Care: Primary Care Provider AEB patient encouraged to follow up with primary care provider.  Patient/Guardian was advised Release of Information must be obtained prior to any record release in order to  collaborate their care with an outside provider. Patient/Guardian was advised if they have not already done so to contact the registration department to sign all necessary forms in order for [place] to release information regarding their care.   Consent: Patient/Guardian gives verbal consent  for treatment and assignment of benefits for services provided during this visit. Patient/Guardian expressed understanding and agreed to proceed.   Follow-up in clinic in 3 months or sooner if needed. Jomarie Longs, MD 01/13/2023, 5:20 PM

## 2023-01-13 NOTE — Patient Instructions (Signed)
Please get EKG as well as labs, lipid panel , Hemoglobin A1C, Prolactin, Repeat sodium.

## 2023-01-18 ENCOUNTER — Ambulatory Visit
Admission: RE | Admit: 2023-01-18 | Discharge: 2023-01-18 | Disposition: A | Discharge status: Home / Self Care | Payer: Medicare HMO | Source: Ambulatory Visit | Attending: Oncology | Admitting: Oncology

## 2023-01-18 DIAGNOSIS — Z170 Estrogen receptor positive status [ER+]: Secondary | ICD-10-CM

## 2023-01-18 DIAGNOSIS — C50212 Malignant neoplasm of upper-inner quadrant of left female breast: Secondary | ICD-10-CM | POA: Insufficient documentation

## 2023-01-18 DIAGNOSIS — Z853 Personal history of malignant neoplasm of breast: Secondary | ICD-10-CM | POA: Insufficient documentation

## 2023-01-18 DIAGNOSIS — Z08 Encounter for follow-up examination after completed treatment for malignant neoplasm: Secondary | ICD-10-CM | POA: Insufficient documentation

## 2023-01-18 HISTORY — DX: Personal history of irradiation: Z92.3

## 2023-01-20 DIAGNOSIS — R7303 Prediabetes: Secondary | ICD-10-CM | POA: Insufficient documentation

## 2023-03-24 ENCOUNTER — Telehealth: Payer: Self-pay

## 2023-03-24 DIAGNOSIS — F250 Schizoaffective disorder, bipolar type: Secondary | ICD-10-CM

## 2023-03-24 MED ORDER — OLANZAPINE 5 MG PO TABS: 5.0000 mg | ORAL_TABLET | Freq: Every day | ORAL | 3 refills | Status: DC

## 2023-03-24 NOTE — Telephone Encounter (Signed)
Made several attempts to call patient to make aware of the medication had been sent to the pharmacy got message that the call could not be completed

## 2023-03-24 NOTE — Addendum Note (Signed)
Addended byJomarie Longs on: 03/24/2023 01:23 PM   Modules accepted: Orders

## 2023-03-24 NOTE — Telephone Encounter (Signed)
I have sent olanzapine to pharmacy. 

## 2023-03-24 NOTE — Telephone Encounter (Signed)
Received fax from pharmacy requesting a refill of the following medication please advise  OLANZapine (ZYPREXA) 5 MG tablet   Last visit 01/13/23 Next visit 04/15/23  Pharmacy [facility] - [place], [place] - [address]

## 2023-04-15 ENCOUNTER — Encounter: Payer: Self-pay | Admitting: Psychiatry

## 2023-04-15 ENCOUNTER — Ambulatory Visit (INDEPENDENT_AMBULATORY_CARE_PROVIDER_SITE_OTHER): Payer: 59 | Admitting: Psychiatry

## 2023-04-15 VITALS — BP 118/82 | HR 99 | Temp 97.8°F | Ht 61.0 in | Wt 197.6 lb

## 2023-04-15 DIAGNOSIS — F4010 Social phobia, unspecified: Secondary | ICD-10-CM | POA: Diagnosis not present

## 2023-04-15 DIAGNOSIS — F4310 Post-traumatic stress disorder, unspecified: Secondary | ICD-10-CM | POA: Diagnosis not present

## 2023-04-15 DIAGNOSIS — F250 Schizoaffective disorder, bipolar type: Secondary | ICD-10-CM

## 2023-04-15 DIAGNOSIS — F17200 Nicotine dependence, unspecified, uncomplicated: Secondary | ICD-10-CM | POA: Diagnosis not present

## 2023-04-15 MED ORDER — BUPROPION HCL ER (XL) 150 MG PO TB24: 150.0000 mg | ORAL_TABLET | Freq: Every day | ORAL | 1 refills | Status: DC

## 2023-04-15 MED ORDER — BUPROPION HCL ER (XL) 300 MG PO TB24: 300.0000 mg | ORAL_TABLET | Freq: Every day | ORAL | 1 refills | Status: DC

## 2023-04-15 NOTE — Progress Notes (Signed)
 [facility] MD OP Progress Note  04/15/2023 9:55 AM Patricia Good  MRN:  [id number]  Chief Complaint:  Chief Complaint  Patient presents with   Follow-up   Schizophrenia   Depression   Medication Refill   HPI: Patricia Good is a 63 year old Caucasian female who has a history of schizoaffective disorder, PTSD, social anxiety disorder, COPD, vitamin B12 deficiency, psoriasis, history of malignant melanoma, hypothyroidism, osteoarthritis, stage Ib invasive mammary carcinoma of the left breast, ER/PR-positive, HER 2 negative, status post left sided partial mastectomy currently on letrozole  was evaluated in office today.  The patient reports that her cancer treatment is progressing well. She has been seeing an oncologist regularly and reports no new health concerns. She recently moved to a lower floor apartment, which she is pleased with.  The patient has been experiencing feelings of depression, despite being generally content with her surroundings. She reports feeling down and out and sad most of the time, but struggles to identify a specific cause for these feelings. She spent the holiday season in a quiet manner, participating in a communal dinner sponsored by her landlord.  The patient is currently on following medications: olanzapine  (5mg ) for mood swings and hallucinations, and bupropion  (300mg ) as an antidepressant. She expresses a desire to increase the dosage of bupropion , hoping it might alleviate her depressive symptoms. She also mentions that she has been smoking about half a pack of cigarettes a day, having previously quit for eight weeks.  The patient also reports occasional intrusive memories and flashbacks related to her PTSD, but states that these symptoms are better managed now than in the past. She continues to take medication for nightmares associated with PTSD.  The patient is also dealing with a financial obligation, making payments on a car which she has agreed not to drive until  it is fully paid off. The car is expected to be paid off in eight months.  Patient currently denies any suicidality, homicidality or perceptual disturbances.     Visit Diagnosis:    ICD-10-CM   1. Schizoaffective disorder, bipolar type (HCC)  F25.0 buPROPion  (WELLBUTRIN  XL) 300 MG 24 hr tablet    buPROPion  (WELLBUTRIN  XL) 150 MG 24 hr tablet    2. PTSD (post-traumatic stress disorder)  F43.10 buPROPion  (WELLBUTRIN  XL) 300 MG 24 hr tablet    3. Social anxiety disorder  F40.10     4. Tobacco use disorder  F17.200       Past Psychiatric History: I have reviewed past psychiatric history from progress note on 07/16/2017.  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   Personal history of radiation therapy    Pre-diabetes    PTSD (post-traumatic stress disorder)    Schizoaffective disorder [facility])     Past Surgical History:  Procedure Laterality Date   BREAST BIOPSY Left 03/03/2022   Left breast stereo bx Ribbon Clip- path pending   BREAST BIOPSY Left 03/03/2022   MM LT BREAST BX W LOC DEV 1ST LESION IMAGE BX SPEC STEREO GUIDE 03/03/2022 [facility]  BREAST LUMPECTOMY Left 03/20/2022   ECTOPIC PREGNANCY SURGERY     PART MASTECTOMY,RADIO FREQUENCY LOCALIZER,AXILLARY SENTINEL NODE BIOPSY Left 03/20/2022   Procedure: PART MASTECTOMY,RADIO FREQUENCY LOCALIZER,AXILLARY SENTINEL NODE BIOPSY;  Surgeon: Rodolph Romano, MD;  Location: [facility] ORS;  Service: General;  Laterality: Left;   TOTAL ABDOMINAL HYSTERECTOMY W/ BILATERAL SALPINGOOPHORECTOMY  1990    Family Psychiatric  History: I have reviewed family psychiatric history from progress note on 07/16/2017.  Family History:  Family History  Problem Relation Age of Onset   Hypertension Father    Diabetes Father    Heart disease Father    Blindness Father    Alcohol abuse Sister    Drug abuse Sister    Anxiety disorder Sister    Depression Sister    Obesity Brother    Arthritis Brother    Alcohol abuse Brother    Drug abuse Brother    Depression Brother    Breast cancer Cousin    Clotting disorder Neg Hx     Social History: I have reviewed social history from progress note on 07/16/2017. Social History   Socioeconomic History   Marital status: Single    Spouse name: Not on file   Number of children: 0   Years of education: Not on file   Highest education level: Associate degree: occupational, scientist, product/process development, or vocational program  Occupational History   Not on file  Tobacco Use   Smoking status: Every Day    Current packs/day: 0.50    Average packs/day: 0.5 packs/day for 54.2 years (27.1 ttl pk-yrs)    Types: Cigarettes    Start date: 01/22/1969   Smokeless tobacco: Never   Tobacco comments:    Continues to go down on number per day to 5-7 per day.   Vaping Use   Vaping status: Never Used  Substance and Sexual Activity   Alcohol use: Not Currently    Alcohol/week: 1.0 - 2.0 standard drink of alcohol    Types: 1 Glasses of wine per week    Comment: occasional   Drug use: No   Sexual activity: Not Currently  Other Topics Concern   Not on file  Social History Narrative   Lives alone   Social Drivers of Health   Financial Resource Strain: Medium Risk (06/15/2017)   Overall Financial Resource Strain (CARDIA)    Difficulty of Paying Living Expenses: Somewhat hard  Food Insecurity: Food Insecurity Present (06/15/2017)   Hunger Vital Sign    Worried About Running Out of Food in the Last Year: Sometimes true    Ran Out of Food in the Last Year: Sometimes true  Transportation Needs: No  Transportation Needs (06/15/2017)   PRAPARE - Administrator, Civil Service (Medical): No    Lack of Transportation (Non-Medical): No  Physical Activity: Inactive (06/15/2017)   Exercise Vital Sign    Days of Exercise per Week: 0 days    Minutes of Exercise per Session: 0 min  Stress: Stress Concern Present (06/15/2017)   [employer] of Occupational Health - Occupational Stress Questionnaire    Feeling of Stress : Very much  Social Connections: Socially Isolated (06/15/2017)   Social Connection and Isolation Panel [NHANES]    Frequency of Communication with Friends and Family: Never    Frequency of Social Gatherings with Friends and Family: Never    Attends Religious Services: Never    Database Administrator or Organizations: No    Attends Banker Meetings: Never    Marital  Status: Divorced    Allergies:  Allergies  Allergen Reactions   Shellfish Allergy Anaphylaxis    Only to crab meat (non-imitation), not shrimp   Sulfa Antibiotics Hives, Anaphylaxis and Other (See Comments)   Sulfasalazine Anaphylaxis and Hives    Other reaction(s): Other (See Comments)   Lisinopril     unknown    Metabolic Disorder Labs: Lab Results  Component Value Date   HGBA1C 5.7 (H) 05/06/2020   MPG 116.89 05/06/2020   MPG 117 12/21/2017   Lab Results  Component Value Date   PROLACTIN 1.3 (L) 05/06/2020   PROLACTIN 1.2 (L) 07/12/2017   Lab Results  Component Value Date   CHOL 193 05/06/2020   TRIG 103 05/06/2020   HDL 56 05/06/2020   CHOLHDL 3.4 05/06/2020   VLDL 21 05/06/2020   LDLCALC 116 (H) 05/06/2020   LDLCALC 163 (H) 12/21/2017   Lab Results  Component Value Date   TSH 2.250 06/17/2021   TSH 2.143 05/06/2020    Therapeutic Level Labs: No results found for: LITHIUM No results found for: VALPROATE No results found for: CBMZ  Current Medications: Current Outpatient Medications  Medication Sig Dispense Refill   albuterol  (VENTOLIN  HFA) 108  (90 Base) MCG/ACT inhaler Inhale into the lungs.     alendronate (FOSAMAX) 70 MG tablet Take 70 mg by mouth once a week.     amLODipine  (NORVASC ) 10 MG tablet Take 10 mg by mouth daily.     atorvastatin (LIPITOR) 20 MG tablet Take 20 mg by mouth at bedtime.     B Complex-C (B-COMPLEX WITH VITAMIN C) tablet Take 1 tablet by mouth daily.     buPROPion  (WELLBUTRIN  XL) 150 MG 24 hr tablet Take 1 tablet (150 mg total) by mouth daily. Take along with 300 mg daily , total of 450 mg daily 90 tablet 1   calcium -vitamin D  (OSCAL WITH D) 500-200 MG-UNIT tablet Take 1 tablet by mouth daily with breakfast.     furosemide  (LASIX ) 20 MG tablet Take 20 mg by mouth daily.      hydrOXYzine  (ATARAX ) 10 MG tablet Take 2 tablets (20 mg total) by mouth at bedtime as needed. for sleep and anxiety 180 tablet 0   letrozole  (FEMARA ) 2.5 MG tablet TAKE 1 TABLET BY MOUTH DAILY 30 tablet 3   levothyroxine  (SYNTHROID , LEVOTHROID) 112 MCG tablet      meloxicam (MOBIC) 15 MG tablet Take 1 tablet by mouth daily.     OLANZapine  (ZYPREXA ) 5 MG tablet Take 1 tablet (5 mg total) by mouth at bedtime. 90 tablet 3   omeprazole (PRILOSEC) 40 MG capsule Take 40 mg by mouth daily.      prazosin  (MINIPRESS ) 2 MG capsule Take 1 capsule (2 mg total) by mouth at bedtime. 90 capsule 1   PREVIDENT 5000 DRY MOUTH 1.1 % GEL dental gel Place onto teeth.     SPIRIVA  HANDIHALER 18 MCG inhalation capsule      valACYclovir (VALTREX) 1000 MG tablet      buPROPion  (WELLBUTRIN  XL) 300 MG 24 hr tablet Take 1 tablet (300 mg total) by mouth daily. Take along with 150 mg daily , total of 450 mg daily 90 tablet 1   No current facility-administered medications for this visit.     Musculoskeletal: Strength & Muscle Tone: within normal limits Gait & Station: normal Patient leans: N/A  Psychiatric Specialty Exam: Review of Systems  Psychiatric/Behavioral:  Positive for dysphoric mood.     Blood pressure 118/82, pulse 99, temperature  97.8 F (36.6  C), temperature source Temporal, height 5' 1 (1.549 m), weight 197 lb 9.6 oz (89.6 kg), SpO2 97%.Body mass index is 37.34 kg/m.  General Appearance: Casual  Eye Contact:  Fair  Speech:  Clear and Coherent  Volume:  Normal  Mood:  Depressed  Affect:  Congruent  Thought Process:  Goal Directed and Descriptions of Associations: Intact  Orientation:  Full (Time, Place, and Person)  Thought Content: Logical   Suicidal Thoughts:  No  Homicidal Thoughts:  No  Memory:  Immediate;   Fair Recent;   Fair Remote;   Fair  Judgement:  Fair  Insight:  Fair  Psychomotor Activity:  Normal  Concentration:  Concentration: Fair and Attention Span: Fair  Recall:  [employer] of Knowledge: Fair  Language: Fair  Akathisia:  No  Handed:  Right  AIMS (if indicated): done  Assets:  Desire for Improvement Housing Transportation  ADL's:  Intact  Cognition: WNL  Sleep:  Fair   Screenings: Midwife Visit from 04/15/2023 in [facility] Office Visit from 01/13/2023 in [facility] Office Visit from 08/26/2022 in [facility] Office Visit from 06/18/2022 in [facility] Office Visit from 05/06/2022 in [facility]  AIMS Total Score 0 0 0 0 0      AUDIT    Flowsheet Row Admission (Discharged) from 12/22/2017 in [facility]  Alcohol Use Disorder Identification Test Final Score (AUDIT) 1      GAD-7    Flowsheet Row Office Visit from 04/15/2023 in [facility] Office Visit from 01/13/2023 in [facility] Office Visit from 08/26/2022 in [facility] Office Visit from 06/18/2022 in [facility] Office Visit from  05/06/2022 in [facility]  Total GAD-7 Score 2 2 1 3 6       PHQ2-9    Flowsheet Row Office Visit from 04/15/2023 in [facility] Office Visit from 01/13/2023 in [facility] Office Visit from 08/26/2022 in [facility] Office Visit from 06/18/2022 in [facility] Office Visit from 05/06/2022 in [facility]  PHQ-2 Total Score 4 0 0 1 3  PHQ-9 Total Score 12 -- 1 7 11       Flowsheet Row Office Visit from 04/15/2023 in [facility] Office Visit from 01/13/2023 in [facility] Office Visit from 06/18/2022 in [facility]  C-SSRS RISK CATEGORY No Risk Low Risk No Risk        Assessment and Plan: TAYELOR OSBORNE is a 63 year old Caucasian female, divorced, on SSD, currently lives in [place], has a history of schizoaffective disorder, PTSD, social anxiety disorder, history of breast cancer status post mastectomy was evaluated in office today.  Patient presented with worsening depression symptoms, discussed assessment and plan as noted below.  Schizoaffective disorder bipolar type-unstable Reports persistent sadness despite satisfaction with surroundings. Currently on bupropion  300 mg and olanzapine  5 mg. No hallucinations. Prefers not to engage in therapy due to PTSD exacerbation. Informed about bupropion  benefits (no weight gain, potential smoking cessation aid) and risks (potential increase in anxiety or anger). - Increase bupropion  to 450  mg daily (300 mg + 150 mg in the morning) - Send prescriptions for bupropion  300 mg and 150 mg to [facility] - Continue olanzapine  5 mg at bedtime  Post-Traumatic Stress Disorder  (PTSD)-improving Experiences occasional intrusive memories and flashbacks. Symptoms better managed compared to before. Prefers not to engage in therapy as it exacerbates PTSD symptoms. - Continue prazosin  2 mg at bedtime - Continue hydroxyzine  10 - 20 mg p.o. nightly as needed.  Social anxiety disorder-unstable  Patient with chronic social anxiety, stays to herself limited social activities. - Declines referral to therapist.  Tobacco use disorder-unstable Smokes about half a pack of cigarettes per day. Previously stopped for eight weeks but resumed. Not using Chantix  or nicotine  patches. Discussed that increasing bupropion  may help with smoking cessation. - Increase bupropion  to 450 mg daily (300 mg + 150 mg in the morning) - Provided counseling for 1 minute.  Follow-up - Schedule follow-up appointment in four weeks.    Collaboration of Care: Collaboration of Care: Patient refused AEB patient declined referral for CBT  Patient/Guardian was advised Release of Information must be obtained prior to any record release in order to collaborate their care with an outside provider. Patient/Guardian was advised if they have not already done so to contact the registration department to sign all necessary forms in order for us  to release information regarding their care.   Consent: Patient/Guardian gives verbal consent for treatment and assignment of benefits for services provided during this visit. Patient/Guardian expressed understanding and agreed to proceed.   This note was generated in part or whole with voice recognition software. Voice recognition is usually quite accurate but there are transcription errors that can and very often do occur. I apologize for any typographical errors that were not detected and corrected.    Shawonda Kerce, MD 04/16/2023, 8:04 AM

## 2023-04-21 ENCOUNTER — Inpatient Hospital Stay: Payer: 59 | Attending: Oncology | Admitting: Oncology

## 2023-04-21 ENCOUNTER — Other Ambulatory Visit: Payer: Self-pay | Admitting: Oncology

## 2023-04-21 ENCOUNTER — Encounter: Payer: Self-pay | Admitting: Oncology

## 2023-04-21 VITALS — BP 122/65 | HR 80 | Temp 96.6°F | Resp 18 | Wt 193.4 lb

## 2023-04-21 DIAGNOSIS — M810 Age-related osteoporosis without current pathological fracture: Secondary | ICD-10-CM | POA: Diagnosis not present

## 2023-04-21 DIAGNOSIS — F259 Schizoaffective disorder, unspecified: Secondary | ICD-10-CM | POA: Diagnosis not present

## 2023-04-21 DIAGNOSIS — Z79811 Long term (current) use of aromatase inhibitors: Secondary | ICD-10-CM | POA: Insufficient documentation

## 2023-04-21 DIAGNOSIS — C50212 Malignant neoplasm of upper-inner quadrant of left female breast: Secondary | ICD-10-CM | POA: Diagnosis present

## 2023-04-21 DIAGNOSIS — F17210 Nicotine dependence, cigarettes, uncomplicated: Secondary | ICD-10-CM | POA: Insufficient documentation

## 2023-04-21 DIAGNOSIS — Z08 Encounter for follow-up examination after completed treatment for malignant neoplasm: Secondary | ICD-10-CM

## 2023-04-21 DIAGNOSIS — Z79899 Other long term (current) drug therapy: Secondary | ICD-10-CM | POA: Insufficient documentation

## 2023-04-21 DIAGNOSIS — F4310 Post-traumatic stress disorder, unspecified: Secondary | ICD-10-CM | POA: Insufficient documentation

## 2023-04-21 DIAGNOSIS — Z853 Personal history of malignant neoplasm of breast: Secondary | ICD-10-CM | POA: Diagnosis not present

## 2023-04-21 DIAGNOSIS — Z803 Family history of malignant neoplasm of breast: Secondary | ICD-10-CM | POA: Insufficient documentation

## 2023-04-21 DIAGNOSIS — Z170 Estrogen receptor positive status [ER+]: Secondary | ICD-10-CM | POA: Insufficient documentation

## 2023-04-21 NOTE — Progress Notes (Signed)
 Hematology/Oncology Consult note [facility]  [phone] [phone]  Patient Care Team: Entzminger, Iantha Mainland, MD as PCP - General (Internal Medicine) Waverly Hageman, RN as Oncology Nurse Navigator   Name of the patient: Patricia Good  [id number]  Jan 26, 1961   Date of visit: 04/21/23  Diagnosis- pathological prognostic stage Ib invasive mammary carcinoma of the left breast pT1a N0 M0 ER/PR positive HER2 negative   Chief complaint/ Reason for visit-routine follow-up of breast cancer  Heme/Onc history: Patient is a 63 year old female who underwent a routine screening bilateral mammogram in October 2023 which showed possible calcifications in the left breast. This was followed by a diagnostic mammogram which showed a 3 mm group of calcifications in the upper inner quadrant of the left breast and a biopsy was recommended. Core biopsy showed invasive mammary carcinoma 2 mm grade 2 ER greater than 90% positive PR greater than 90% positive and HER2 negative.    Patient has baseline osteoporosis and is currently on weekly Fosamax   Final pathology showed 4 mm grade 3 tumor with negative margins.  Given the high negative tumor Oncotype testing was sent and came back at high risk with a recurrence score of 28.  Patient declined adjuvant chemotherapy.  She started adjuvant radiation but stopped prematurely due to concern for side effects.  Patient started taking Arimidex in May 2024   Patient has a history of schizoaffective/bipolar disorder, PTSD and social anxiety disorder for which she follows up with psychiatry  Interval history-she has baseline fatigue which is unchanged.States that she has not missed any doses of letrozole  so far she is also taking her calcium  vitamin D  and Fosamax she is following up with psychiatry for her history of schizoaffective disorder as well as PTSD she is on medications for the same  ECOG PS- 1 Pain scale- 0   Review  of systems- Review of Systems  Constitutional:  Positive for malaise/fatigue. Negative for chills, fever and weight loss.  HENT:  Negative for congestion, ear discharge and nosebleeds.   Eyes:  Negative for blurred vision.  Respiratory:  Negative for cough, hemoptysis, sputum production, shortness of breath and wheezing.   Cardiovascular:  Negative for chest pain, palpitations, orthopnea and claudication.  Gastrointestinal:  Negative for abdominal pain, blood in stool, constipation, diarrhea, heartburn, melena, nausea and vomiting.  Genitourinary:  Negative for dysuria, flank pain, frequency, hematuria and urgency.  Musculoskeletal:  Negative for back pain, joint pain and myalgias.  Skin:  Negative for rash.  Neurological:  Negative for dizziness, tingling, focal weakness, seizures, weakness and headaches.  Endo/Heme/Allergies:  Does not bruise/bleed easily.  Psychiatric/Behavioral:  Positive for depression. Negative for suicidal ideas. The patient does not have insomnia.       Allergies  Allergen Reactions   Shellfish Allergy Anaphylaxis    Only to crab meat (non-imitation), not shrimp   Sulfa Antibiotics Hives, Anaphylaxis and Other (See Comments)   Sulfasalazine Anaphylaxis and Hives    Other reaction(s): Other (See Comments)   Lisinopril     unknown     Past Medical History:  Diagnosis Date   Abnormal thyroid  stimulating hormone (TSH) level    Anemia    Anxiety    Asthma    Bipolar 1 disorder (HCC)    COPD (chronic obstructive pulmonary disease) (HCC)    Endometriosis    GERD (gastroesophageal reflux disease)    Hepatitis A    Hypertension    Hypothyroidism    Malignant neoplasm  of upper-inner quadrant of left breast in female, 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   Personal history of radiation therapy    Pre-diabetes    PTSD (post-traumatic stress disorder)    Schizoaffective disorder [facility])      Past Surgical History:  Procedure Laterality Date   BREAST  BIOPSY Left 03/03/2022   Left breast stereo bx Ribbon Clip- path pending   BREAST BIOPSY Left 03/03/2022   MM LT BREAST BX W LOC DEV 1ST LESION IMAGE BX SPEC STEREO GUIDE 03/03/2022 [facility]   BREAST LUMPECTOMY Left 03/20/2022   ECTOPIC PREGNANCY SURGERY     PART MASTECTOMY,RADIO FREQUENCY LOCALIZER,AXILLARY SENTINEL NODE BIOPSY Left 03/20/2022   Procedure: PART MASTECTOMY,RADIO FREQUENCY LOCALIZER,AXILLARY SENTINEL NODE BIOPSY;  Surgeon: Eldred Grego, MD;  Location: [facility] ORS;  Service: General;  Laterality: Left;   TOTAL ABDOMINAL HYSTERECTOMY W/ BILATERAL SALPINGOOPHORECTOMY  1990    Social History   Socioeconomic History   Marital status: Single    Spouse name: Not on file   Number of children: 0   Years of education: Not on file   Highest education level: Associate degree: occupational, Scientist, product/process development, or vocational program  Occupational History   Not on file  Tobacco Use   Smoking status: Every Day    Current packs/day: 0.50    Average packs/day: 0.5 packs/day for 54.2 years (27.1 ttl pk-yrs)    Types: Cigarettes    Start date: 01/22/1969   Smokeless tobacco: Never   Tobacco comments:    Continues to go down on number per day to 5-7 per day.   Vaping Use   Vaping status: Never Used  Substance and Sexual Activity   Alcohol use: Not Currently    Alcohol/week: 1.0 - 2.0 standard drink of alcohol    Types: 1 Glasses of wine per week    Comment: occasional   Drug use: No   Sexual activity: Not Currently  Other Topics Concern   Not on file  Social History Narrative   Lives alone   Social Drivers of Health   Financial Resource Strain: Medium Risk (06/15/2017)   Overall Financial Resource Strain (CARDIA)    Difficulty of Paying Living Expenses: Somewhat hard  Food Insecurity: Food Insecurity Present (06/15/2017)   Hunger Vital Sign    Worried About Running Out of Food in the Last Year: Sometimes true    Ran Out of Food in the Last Year: Sometimes true   Transportation Needs: No Transportation Needs (06/15/2017)   PRAPARE - Administrator, Civil Service (Medical): No    Lack of Transportation (Non-Medical): No  Physical Activity: Inactive (06/15/2017)   Exercise Vital Sign    Days of Exercise per Week: 0 days    Minutes of Exercise per Session: 0 min  Stress: Stress Concern Present (06/15/2017)   [employer] of Occupational Health - Occupational Stress Questionnaire    Feeling of Stress : Very much  Social Connections: Socially Isolated (06/15/2017)   Social Connection and Isolation Panel [NHANES]    Frequency of Communication with Friends and Family: Never    Frequency of Social Gatherings with Friends and Family: Never    Attends Religious Services: Never    Database administrator or Organizations: No    Attends Banker Meetings: Never    Marital Status: Divorced  Catering manager Violence: Not At Risk (06/15/2017)   Humiliation, Afraid, Rape, and Kick questionnaire    Fear of Current or Ex-Partner: No    Emotionally Abused: No  Physically Abused: No    Sexually Abused: No    Family History  Problem Relation Age of Onset   Hypertension Father    Diabetes Father    Heart disease Father    Blindness Father    Alcohol abuse Sister    Drug abuse Sister    Anxiety disorder Sister    Depression Sister    Obesity Brother    Arthritis Brother    Alcohol abuse Brother    Drug abuse Brother    Depression Brother    Breast cancer Cousin    Clotting disorder Neg Hx      Current Outpatient Medications:    albuterol  (VENTOLIN  HFA) 108 (90 Base) MCG/ACT inhaler, Inhale into the lungs., Disp: , Rfl:    alendronate (FOSAMAX) 70 MG tablet, Take 70 mg by mouth once a week., Disp: , Rfl:    amLODipine  (NORVASC ) 10 MG tablet, Take 10 mg by mouth daily., Disp: , Rfl:    atorvastatin (LIPITOR) 20 MG tablet, Take 20 mg by mouth at bedtime., Disp: , Rfl:    B Complex-C (B-COMPLEX WITH VITAMIN C) tablet, Take  1 tablet by mouth daily., Disp: , Rfl:    buPROPion  (WELLBUTRIN  XL) 150 MG 24 hr tablet, Take 1 tablet (150 mg total) by mouth daily. Take along with 300 mg daily , total of 450 mg daily, Disp: 90 tablet, Rfl: 1   buPROPion  (WELLBUTRIN  XL) 300 MG 24 hr tablet, Take 1 tablet (300 mg total) by mouth daily. Take along with 150 mg daily , total of 450 mg daily, Disp: 90 tablet, Rfl: 1   calcium -vitamin D  (OSCAL WITH D) 500-200 MG-UNIT tablet, Take 1 tablet by mouth daily with breakfast., Disp: , Rfl:    furosemide  (LASIX ) 20 MG tablet, Take 20 mg by mouth daily. , Disp: , Rfl:    hydrOXYzine  (ATARAX ) 10 MG tablet, Take 2 tablets (20 mg total) by mouth at bedtime as needed. for sleep and anxiety, Disp: 180 tablet, Rfl: 0   letrozole  (FEMARA ) 2.5 MG tablet, TAKE 1 TABLET BY MOUTH DAILY, Disp: 30 tablet, Rfl: 3   levothyroxine  (SYNTHROID , LEVOTHROID) 112 MCG tablet, , Disp: , Rfl:    meloxicam (MOBIC) 15 MG tablet, Take 1 tablet by mouth daily., Disp: , Rfl:    OLANZapine  (ZYPREXA ) 5 MG tablet, Take 1 tablet (5 mg total) by mouth at bedtime., Disp: 90 tablet, Rfl: 3   omeprazole (PRILOSEC) 40 MG capsule, Take 40 mg by mouth daily. , Disp: , Rfl:    prazosin  (MINIPRESS ) 2 MG capsule, Take 1 capsule (2 mg total) by mouth at bedtime., Disp: 90 capsule, Rfl: 1   PREVIDENT 5000 DRY MOUTH 1.1 % GEL dental gel, Place onto teeth., Disp: , Rfl:    SPIRIVA  HANDIHALER 18 MCG inhalation capsule, , Disp: , Rfl:    valACYclovir (VALTREX) 1000 MG tablet, , Disp: , Rfl:   Physical exam:  Vitals:   04/21/23 1016  BP: 122/65  Pulse: 80  Resp: 18  Temp: (!) 96.6 F (35.9 C)  TempSrc: Tympanic  SpO2: 100%  Weight: 193 lb 6.4 oz (87.7 kg)   Physical Exam Cardiovascular:     Rate and Rhythm: Normal rate and regular rhythm.     Heart sounds: Normal heart sounds.  Pulmonary:     Effort: Pulmonary effort is normal.     Breath sounds: Normal breath sounds.  Skin:    General: Skin is warm and dry.  Neurological:  Mental Status: She is alert and oriented to person, place, and time.         Latest Ref Rng & Units 10/16/2022   10:09 AM  CMP  Glucose 70 - 99 mg/dL 811   BUN 8 - 23 mg/dL 13   Creatinine 9.14 - 1.00 mg/dL 7.82   Sodium 956 - 213 mmol/L 130   Potassium 3.5 - 5.1 mmol/L 4.2   Chloride 98 - 111 mmol/L 94   CO2 22 - 32 mmol/L 29   Calcium  8.9 - 10.3 mg/dL 8.7   Total Protein 6.5 - 8.1 g/dL 7.8   Total Bilirubin 0.3 - 1.2 mg/dL 0.4   Alkaline Phos 38 - 126 U/L 203   AST 15 - 41 U/L 37   ALT 0 - 44 U/L 43       Latest Ref Rng & Units 06/12/2019   11:41 PM  CBC  WBC 4.0 - 10.5 K/uL 11.1   Hemoglobin 12.0 - 15.0 g/dL 08.6   Hematocrit 57.8 - 46.0 % 37.7   Platelets 150 - 400 K/uL 247     Assessment and plan- Patient is a 63 y.o. 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  She is here for routine follow-up of breast cancer currently on letrozole   Clinically patient is doing well with no concerning signs and symptoms of recurrence based on today's exam.  She is reporting to be compliant with letrozole  along with calcium  vitamin D  and Fosamax which she will continue.I will see her back in 6 months no labs.  Her mammogram from October 2024 was unremarkable.  She has symptoms of schizoaffective disorder and PTSD for which she continues to follow-up with psychiatry and is currently on bupropion  and olanzapine  the dose of which was increased recently.   Visit Diagnosis 1. Encounter for follow-up surveillance of breast cancer   2. Use of letrozole  (Femara )      Dr. Seretha Dance, MD, MPH [facility] [phone] 04/21/2023 1:49 PM

## 2023-04-22 ENCOUNTER — Telehealth: Payer: Self-pay | Admitting: Psychiatry

## 2023-04-22 NOTE — Telephone Encounter (Signed)
Returned call to patient.  She reports she is going to be evicted from her apartment since she violated her lease agreement.  Provided patient with brief supportive therapy.  Discussed resources including reaching out to family, friends, going to her mother, Extended Stay hotels. Also provided information for community resources.  Crisis plan discussed with patient.  Patient to go to the nearest emergency department or call [phone] if in a crisis.

## 2023-04-22 NOTE — Telephone Encounter (Signed)
Attempted to contact patient-had to leave a voicemail. ?
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
## 2023-04-27 ENCOUNTER — Other Ambulatory Visit: Payer: Self-pay | Admitting: *Deleted

## 2023-04-27 ENCOUNTER — Telehealth: Payer: Self-pay | Admitting: *Deleted

## 2023-04-27 MED ORDER — LETROZOLE 2.5 MG PO TABS: 2.5000 mg | ORAL_TABLET | Freq: Every day | ORAL | 3 refills | Status: DC

## 2023-04-27 NOTE — Progress Notes (Signed)
Pt needs a refill, she is out of med

## 2023-04-27 NOTE — Telephone Encounter (Signed)
Got a call from the pharmacy that the patient is out of letrozole.  I put in an order for patient to get letrozole and I send it electronically but I wanted to let the pharmacy know that it was done and when I called back to the pharmacy they said that they already see now.

## 2023-04-27 NOTE — Telephone Encounter (Signed)
I called the patient and let her know that the the pharmacy sent [place] a refill request and the med is at the pharmacy ready already. Pt appreciate for that

## 2023-05-24 ENCOUNTER — Telehealth: Payer: Self-pay | Admitting: *Deleted

## 2023-05-24 ENCOUNTER — Other Ambulatory Visit: Payer: Self-pay | Admitting: *Deleted

## 2023-05-24 NOTE — Telephone Encounter (Signed)
Pt. Asked for 3 months supply I called the [facility] and the pt. Did not get it for 39 day supply and pharmacy changed it to 90 pills every 3 months and 1 refill per me on telephone with Dr Leaver Robert

## 2023-06-01 ENCOUNTER — Ambulatory Visit (INDEPENDENT_AMBULATORY_CARE_PROVIDER_SITE_OTHER): Payer: No Typology Code available for payment source | Admitting: Psychiatry

## 2023-06-01 ENCOUNTER — Encounter: Payer: Self-pay | Admitting: Psychiatry

## 2023-06-01 ENCOUNTER — Ambulatory Visit: Payer: Self-pay | Admitting: Psychiatry

## 2023-06-01 VITALS — BP 122/76 | HR 88 | Temp 98.5°F | Ht 61.0 in | Wt 190.2 lb

## 2023-06-01 DIAGNOSIS — F17200 Nicotine dependence, unspecified, uncomplicated: Secondary | ICD-10-CM | POA: Diagnosis not present

## 2023-06-01 DIAGNOSIS — F4010 Social phobia, unspecified: Secondary | ICD-10-CM

## 2023-06-01 DIAGNOSIS — F4310 Post-traumatic stress disorder, unspecified: Secondary | ICD-10-CM | POA: Diagnosis not present

## 2023-06-01 DIAGNOSIS — F250 Schizoaffective disorder, bipolar type: Secondary | ICD-10-CM

## 2023-06-01 MED ORDER — HYDROXYZINE HCL 10 MG PO TABS: 20.0000 mg | ORAL_TABLET | Freq: Every evening | ORAL | 0 refills | Status: DC | PRN

## 2023-06-01 NOTE — Progress Notes (Unsigned)
 [facility] MD OP Progress Note  06/01/2023 3:23 PM Patricia Good  MRN:  [id number]  Chief Complaint:  Chief Complaint  Patient presents with   Follow-up   Hallucinations   Depression   Medication Refill   HPI: Patricia Good is a 62 year old Caucasian female, single, lives in [place], who has a history of schizoaffective disorder, PTSD, social anxiety disorder, COPD, vitamin B12 deficiency, psoriasis, history of malignant melanoma, hypothyroidism, osteoarthritis, stage Ib invasive mammary carcinoma of the left breast, ER/PR positive, HER 2 negative status post left sided partial mastectomy currently on letrozole was evaluated in office today.  She currently reports overall mood symptoms are stable.  Currently taking Olanzapine , Minipress and Hydroxyzine as needed, which she has been using every night. She also takes Wellbutrin, with a lower dosage needing a refill next week. No concerns about her medications.  Denies thoughts of self-harm, auditory hallucinations. Not currently speaking with a therapist, as it brings back bad memories, but she feels at peace with herself.  Living in a rooming house on [address], residing alone. She shares two bathrooms with occupants of five rooms and has access to a shared kitchen. She has known the landlord for a long time and has previously leased from him. She pays $500 in rent, which will increase to $600 after her car is paid off in November. She manages grocery shopping with the landlord's assistance and has access to a [employer] across the street.  Prefers to keep to herself and watch TV for entertainment. Content with her current living situation and does not wish to apply for other housing due to past family issues.  Patient today appeared to be alert, oriented to person place time situation.  3 word memory immediate 3 out of 3, after 5 minutes 3 out of 3.  Attention and focus seem to be good in session today.  Sleeping well and has lost  three pounds, which she views positively.   Visit Diagnosis:    ICD-10-CM   1. Schizoaffective disorder, bipolar type (HCC)  F25.0 hydrOXYzine (ATARAX) 10 MG tablet    2. PTSD (post-traumatic stress disorder)  F43.10 hydrOXYzine (ATARAX) 10 MG tablet    3. Social anxiety disorder  F40.10     4. Tobacco use disorder  F17.200       Past Psychiatric History: I have reviewed past psychiatric history from progress note on 07/16/2017.  Past Medical History:  Past Medical History:  Diagnosis Date   Abnormal thyroid stimulating hormone (TSH) level    Anemia    Anxiety    Asthma    Bipolar 1 disorder (HCC)    COPD (chronic obstructive pulmonary disease) (HCC)    Endometriosis    GERD (gastroesophageal reflux disease)    Hepatitis A    Hypertension    Hypothyroidism    Malignant neoplasm of upper-inner quadrant of left breast in female, 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   Personal history of radiation therapy    Pre-diabetes    PTSD (post-traumatic stress disorder)    Schizoaffective disorder (HCC)     Past Surgical History:  Procedure Laterality Date   BREAST BIOPSY Left 03/03/2022   Left breast stereo bx Ribbon Clip- path pending   BREAST BIOPSY Left 03/03/2022   MM LT BREAST BX W LOC DEV 1ST LESION IMAGE BX SPEC STEREO GUIDE 03/03/2022 [facility]   BREAST LUMPECTOMY Left 03/20/2022   ECTOPIC PREGNANCY SURGERY     PART MASTECTOMY,RADIO FREQUENCY LOCALIZER,AXILLARY SENTINEL NODE  BIOPSY Left 03/20/2022   Procedure: PART MASTECTOMY,RADIO FREQUENCY LOCALIZER,AXILLARY SENTINEL NODE BIOPSY;  Surgeon: Carolan Shiver, MD;  Location: [facility] ORS;  Service: General;  Laterality: Left;   TOTAL ABDOMINAL HYSTERECTOMY W/ BILATERAL SALPINGOOPHORECTOMY  1990    Family Psychiatric History: I have reviewed family psychiatric history from progress note on 07/16/2017.  Family History:  Family History  Problem Relation Age of Onset   Hypertension Father    Diabetes Father     Heart disease Father    Blindness Father    Alcohol abuse Sister    Drug abuse Sister    Anxiety disorder Sister    Depression Sister    Obesity Brother    Arthritis Brother    Alcohol abuse Brother    Drug abuse Brother    Depression Brother    Breast cancer Cousin    Clotting disorder Neg Hx     Social History: I have reviewed social history from progress note on 07/16/2017. Social History   Socioeconomic History   Marital status: Single    Spouse name: Not on file   Number of children: 0   Years of education: Not on file   Highest education level: Associate degree: occupational, Scientist, product/process development, or vocational program  Occupational History   Not on file  Tobacco Use   Smoking status: Every Day    Current packs/day: 0.50    Average packs/day: 0.5 packs/day for 54.4 years (27.2 ttl pk-yrs)    Types: Cigarettes    Start date: 01/22/1969   Smokeless tobacco: Never   Tobacco comments:    Continues to go down on number per day to 5-7 per day.   Vaping Use   Vaping status: Never Used  Substance and Sexual Activity   Alcohol use: Not Currently    Alcohol/week: 1.0 - 2.0 standard drink of alcohol    Types: 1 Glasses of wine per week    Comment: occasional   Drug use: No   Sexual activity: Not Currently  Other Topics Concern   Not on file  Social History Narrative   Lives alone   Social Drivers of Health   Financial Resource Strain: Medium Risk (06/15/2017)   Overall Financial Resource Strain (CARDIA)    Difficulty of Paying Living Expenses: Somewhat hard  Food Insecurity: Food Insecurity Present (06/15/2017)   Hunger Vital Sign    Worried About Running Out of Food in the Last Year: Sometimes true    Ran Out of Food in the Last Year: Sometimes true  Transportation Needs: No Transportation Needs (06/15/2017)   PRAPARE - Administrator, Civil Service (Medical): No    Lack of Transportation (Non-Medical): No  Physical Activity: Inactive (06/15/2017)   Exercise  Vital Sign    Days of Exercise per Week: 0 days    Minutes of Exercise per Session: 0 min  Stress: Stress Concern Present (06/15/2017)   Patricia Good [employer] Occupational Health - Occupational Stress Questionnaire    Feeling of Stress : Very much  Social Connections: Socially Isolated (06/15/2017)   Social Connection and Isolation Panel [NHANES]    Frequency of Communication with Friends and Family: Never    Frequency of Social Gatherings with Friends and Family: Never    Attends Religious Services: Never    Database administrator or Organizations: No    Attends Banker Meetings: Never    Marital Status: Divorced    Allergies:  Allergies  Allergen Reactions   Shellfish Allergy Anaphylaxis  Only to crab meat (non-imitation), not shrimp   Sulfa Antibiotics Hives, Anaphylaxis and Other (See Comments)   Sulfasalazine Anaphylaxis and Hives    Other reaction(s): Other (See Comments)   Lisinopril     unknown    Metabolic Disorder Labs: Lab Results  Component Value Date   HGBA1C 5.7 (H) 05/06/2020   MPG 116.89 05/06/2020   MPG 117 12/21/2017   Lab Results  Component Value Date   PROLACTIN 1.3 (L) 05/06/2020   PROLACTIN 1.2 (L) 07/12/2017   Lab Results  Component Value Date   CHOL 193 05/06/2020   TRIG 103 05/06/2020   HDL 56 05/06/2020   CHOLHDL 3.4 05/06/2020   VLDL 21 05/06/2020   LDLCALC 116 (H) 05/06/2020   LDLCALC 163 (H) 12/21/2017   Lab Results  Component Value Date   TSH 2.250 06/17/2021   TSH 2.143 05/06/2020    Therapeutic Level Labs: No results found for: "LITHIUM" No results found for: "VALPROATE" No results found for: "CBMZ"  Current Medications: Current Outpatient Medications  Medication Sig Dispense Refill   albuterol (VENTOLIN HFA) 108 (90 Base) MCG/ACT inhaler Inhale into the lungs.     alendronate (FOSAMAX) 70 MG tablet Take 70 mg by mouth once a week.     amLODipine (NORVASC) 10 MG tablet Take 10 mg by mouth daily.      atorvastatin (LIPITOR) 20 MG tablet Take 20 mg by mouth at bedtime.     B Complex-C (B-COMPLEX WITH VITAMIN C) tablet Take 1 tablet by mouth daily.     buPROPion (WELLBUTRIN XL) 150 MG 24 hr tablet Take 1 tablet (150 mg total) by mouth daily. Take along with 300 mg daily , total of 450 mg daily 90 tablet 1   buPROPion (WELLBUTRIN XL) 300 MG 24 hr tablet Take 1 tablet (300 mg total) by mouth daily. Take along with 150 mg daily , total of 450 mg daily 90 tablet 1   calcium-vitamin D (OSCAL WITH D) 500-200 MG-UNIT tablet Take 1 tablet by mouth daily with breakfast.     furosemide (LASIX) 20 MG tablet Take 20 mg by mouth daily.      letrozole (FEMARA) 2.5 MG tablet TAKE 1 TABLET BY MOUTH DAILY 30 tablet 3   letrozole (FEMARA) 2.5 MG tablet Take 1 tablet (2.5 mg total) by mouth daily. 30 tablet 3   levothyroxine (SYNTHROID, LEVOTHROID) 112 MCG tablet      meloxicam (MOBIC) 15 MG tablet Take 1 tablet by mouth daily.     OLANZapine (ZYPREXA) 5 MG tablet Take 1 tablet (5 mg total) by mouth at bedtime. 90 tablet 3   omeprazole (PRILOSEC) 40 MG capsule Take 40 mg by mouth daily.      prazosin (MINIPRESS) 2 MG capsule Take 1 capsule (2 mg total) by mouth at bedtime. 90 capsule 1   PREVIDENT 5000 DRY MOUTH 1.1 % GEL dental gel Place onto teeth.     SPIRIVA HANDIHALER 18 MCG inhalation capsule      valACYclovir (VALTREX) 1000 MG tablet      hydrOXYzine (ATARAX) 10 MG tablet Take 2 tablets (20 mg total) by mouth at bedtime as needed. for sleep and anxiety 180 tablet 0   No current facility-administered medications for this visit.     Musculoskeletal: Strength & Muscle Tone:  Normal Gait & Station: normal Patient leans: N/A  Psychiatric Specialty Exam: Review of Systems  Psychiatric/Behavioral: Negative.      Blood pressure 122/76, pulse 88, temperature 98.5 F (36.9 C),  temperature source Temporal, height 5\' 1"  (1.549 m), weight 190 lb 3.2 oz (86.3 kg), SpO2 96%.Body mass index is 35.94 kg/m.   General Appearance: Fairly Groomed  Eye Contact:  Fair  Speech:  Clear and Coherent  Volume:  Normal  Mood:  Euthymic  Affect:  Flat  Thought Process:  Goal Directed and Descriptions of Associations: Intact  Orientation:  Full (Time, Place, and Person)  Thought Content: Logical   Suicidal Thoughts:  No  Homicidal Thoughts:  No  Memory:  Immediate;   Fair Recent;   Fair Remote;   Fair  Judgement:  Fair  Insight:  Fair  Psychomotor Activity:  Normal  Concentration:  Concentration: Fair and Attention Span: Fair  Recall:  [employer] of Knowledge: Fair  Language: Fair  Akathisia:  No  Handed:  Right  AIMS (if indicated): done  Assets:  Desire for Improvement Housing Social Support Transportation  ADL's:  Intact  Cognition: WNL  Sleep:  Fair   Screenings: Midwife Visit from 06/01/2023 in [facility] Office Visit from 04/15/2023 in [facility] Office Visit from 01/13/2023 in [facility] Visit from 08/26/2022 in [facility] Office Visit from 06/18/2022 in [facility]  AIMS Total Score 0 0 0 0 0      AUDIT    Flowsheet Row Admission (Discharged) from 12/22/2017 in [facility]  Alcohol Use Disorder Identification Test Final Score (AUDIT) 1      GAD-7    Flowsheet Row Office Visit from 06/01/2023 in [facility] Office Visit from 04/15/2023 in [facility] Office Visit from 01/13/2023 in [facility] Office Visit from 08/26/2022 in [facility] Office Visit from 06/18/2022 in [facility]  Total GAD-7 Score 4 2 2 1 3       PHQ2-9     Flowsheet Row Office Visit from 06/01/2023 in [facility] Office Visit from 04/15/2023 in [facility] Office Visit from 01/13/2023 in [facility] Office Visit from 08/26/2022 in [facility] Office Visit from 06/18/2022 in [facility]  PHQ-2 Total Score 1 4 0 0 1  PHQ-9 Total Score 4 12 -- 1 7      Flowsheet Row Office Visit from 06/01/2023 in [facility] Office Visit from 04/15/2023 in [facility] Office Visit from 01/13/2023 in [facility]  C-SSRS RISK CATEGORY No Risk No Risk Low Risk        Assessment and Plan: Patricia Good is a 63 year old Caucasian female, presented for follow-up appointment, discussed assessment and plan as noted below.  Schizoaffective disorder bipolar type-improving Well-managed on olanzapine . No auditory hallucinations or self-harm ideation. Prefers not to engage in therapy due to past negative experiences. Discussed potential benefits of therapy for additional support and managing current stressors. - Continue Olanzapine 5 mg daily - Continue Wellbutrin 450 mg daily - Encourage reconsideration of therapy for additional support  Posttraumatic stress disorder-improving Chronic PTSD currently denies any significant intrusive memories. - Continue Prazosin 2 mg at bedtime - Patient declined referral for CBT  Social anxiety disorder-unstable Patient with chronic  social anxiety stays to herself with limited social contact. - Continue Hydroxyzine 10 to 20 mg at bedtime as needed - Patient declined referral for CBT.  Tobacco use disorder-unstable Patient smokes about half a pack cigarettes per day.  Not interested in quitting at this time. - We  will reevaluate in future sessions.   Patient will benefit from labs including TSH, lipid panel, hemoglobin A1c, prolactin.  Will consider getting it at next visit if patient unable to get it at primary care provider's office.  Follow-up - Schedule follow-up appointment in two months.  Collaboration of Care: Collaboration of Care: Patient refused AEB patient declined referral for CBT  Patient/Guardian was advised Release of Information must be obtained prior to any record release in order to collaborate their care with an outside provider. Patient/Guardian was advised if they have not already done so to contact the registration department to sign all necessary forms in order for [place] to release information regarding their care.   Consent: Patient/Guardian gives verbal consent for treatment and assignment of benefits for services provided during this visit. Patient/Guardian expressed understanding and agreed to proceed.   This note was generated in part or whole with voice recognition software. Voice recognition is usually quite accurate but there are transcription errors that can and very often do occur. I apologize for any typographical errors that were not detected and corrected.    Jomarie Longs, MD 06/02/2023, 9:39 AM

## 2023-06-07 DIAGNOSIS — Z5320 Procedure and treatment not carried out because of patient's decision for unspecified reasons: Secondary | ICD-10-CM | POA: Diagnosis not present

## 2023-06-10 ENCOUNTER — Telehealth: Payer: Self-pay | Admitting: Psychiatry

## 2023-06-10 NOTE — Telephone Encounter (Signed)
 Thank you for the update!

## 2023-06-16 DIAGNOSIS — N1831 Chronic kidney disease, stage 3a: Secondary | ICD-10-CM | POA: Diagnosis not present

## 2023-06-16 DIAGNOSIS — I700 Atherosclerosis of aorta: Secondary | ICD-10-CM | POA: Diagnosis not present

## 2023-06-16 DIAGNOSIS — I129 Hypertensive chronic kidney disease with stage 1 through stage 4 chronic kidney disease, or unspecified chronic kidney disease: Secondary | ICD-10-CM | POA: Diagnosis not present

## 2023-06-16 DIAGNOSIS — R7303 Prediabetes: Secondary | ICD-10-CM | POA: Diagnosis not present

## 2023-06-16 DIAGNOSIS — E039 Hypothyroidism, unspecified: Secondary | ICD-10-CM | POA: Diagnosis not present

## 2023-06-16 DIAGNOSIS — M810 Age-related osteoporosis without current pathological fracture: Secondary | ICD-10-CM | POA: Diagnosis not present

## 2023-06-21 DIAGNOSIS — N1831 Chronic kidney disease, stage 3a: Secondary | ICD-10-CM | POA: Diagnosis not present

## 2023-06-21 DIAGNOSIS — E039 Hypothyroidism, unspecified: Secondary | ICD-10-CM | POA: Diagnosis not present

## 2023-07-15 ENCOUNTER — Ambulatory Visit: Payer: Self-pay | Admitting: Psychiatry

## 2023-07-20 ENCOUNTER — Ambulatory Visit: Admitting: Infectious Diseases

## 2023-07-26 ENCOUNTER — Other Ambulatory Visit: Payer: Self-pay | Admitting: Psychiatry

## 2023-07-26 DIAGNOSIS — F4310 Post-traumatic stress disorder, unspecified: Secondary | ICD-10-CM

## 2023-07-27 ENCOUNTER — Ambulatory Visit: Admitting: Infectious Diseases

## 2023-07-27 ENCOUNTER — Ambulatory Visit: Attending: Infectious Diseases | Admitting: Infectious Diseases

## 2023-07-27 ENCOUNTER — Encounter: Payer: Self-pay | Admitting: Infectious Diseases

## 2023-07-27 ENCOUNTER — Other Ambulatory Visit
Admission: RE | Admit: 2023-07-27 | Discharge: 2023-07-27 | Disposition: A | Discharge status: Home / Self Care | Source: Ambulatory Visit | Attending: Infectious Diseases | Admitting: Infectious Diseases

## 2023-07-27 VITALS — BP 134/66 | HR 86 | Temp 97.2°F | Ht 61.0 in | Wt 186.0 lb

## 2023-07-27 DIAGNOSIS — E785 Hyperlipidemia, unspecified: Secondary | ICD-10-CM | POA: Insufficient documentation

## 2023-07-27 DIAGNOSIS — N9089 Other specified noninflammatory disorders of vulva and perineum: Secondary | ICD-10-CM | POA: Diagnosis not present

## 2023-07-27 DIAGNOSIS — F17210 Nicotine dependence, cigarettes, uncomplicated: Secondary | ICD-10-CM | POA: Insufficient documentation

## 2023-07-27 DIAGNOSIS — I10 Essential (primary) hypertension: Secondary | ICD-10-CM | POA: Diagnosis not present

## 2023-07-27 DIAGNOSIS — M810 Age-related osteoporosis without current pathological fracture: Secondary | ICD-10-CM | POA: Diagnosis not present

## 2023-07-27 DIAGNOSIS — Z79811 Long term (current) use of aromatase inhibitors: Secondary | ICD-10-CM | POA: Insufficient documentation

## 2023-07-27 DIAGNOSIS — Z79899 Other long term (current) drug therapy: Secondary | ICD-10-CM | POA: Insufficient documentation

## 2023-07-27 DIAGNOSIS — A630 Anogenital (venereal) warts: Secondary | ICD-10-CM | POA: Insufficient documentation

## 2023-07-27 DIAGNOSIS — C50212 Malignant neoplasm of upper-inner quadrant of left female breast: Secondary | ICD-10-CM | POA: Diagnosis not present

## 2023-07-27 DIAGNOSIS — Z7983 Long term (current) use of bisphosphonates: Secondary | ICD-10-CM | POA: Diagnosis not present

## 2023-07-27 DIAGNOSIS — E039 Hypothyroidism, unspecified: Secondary | ICD-10-CM | POA: Insufficient documentation

## 2023-07-27 DIAGNOSIS — F32A Depression, unspecified: Secondary | ICD-10-CM | POA: Insufficient documentation

## 2023-07-27 DIAGNOSIS — J441 Chronic obstructive pulmonary disease with (acute) exacerbation: Secondary | ICD-10-CM

## 2023-07-27 DIAGNOSIS — J449 Chronic obstructive pulmonary disease, unspecified: Secondary | ICD-10-CM | POA: Insufficient documentation

## 2023-07-27 DIAGNOSIS — A6004 Herpesviral vulvovaginitis: Secondary | ICD-10-CM | POA: Insufficient documentation

## 2023-07-27 DIAGNOSIS — Z170 Estrogen receptor positive status [ER+]: Secondary | ICD-10-CM | POA: Diagnosis not present

## 2023-07-27 DIAGNOSIS — K219 Gastro-esophageal reflux disease without esophagitis: Secondary | ICD-10-CM | POA: Insufficient documentation

## 2023-07-27 LAB — HIV ANTIBODY (ROUTINE TESTING W REFLEX): HIV Screen 4th Generation wRfx: NONREACTIVE
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
## 2023-07-27 NOTE — Patient Instructions (Signed)
 VISIT SUMMARY:  Today, you were seen for worsening genital lesions. You were told you had herpes, and despite taking Valtrex, your symptoms have worsened over the past year. We discussed your current medications and medical history, including your past breast cancer treatment, depression, hypothyroidism, hypertension, hyperlipidemia, acid reflux, COPD, and osteoporosis.  YOUR PLAN:  -HERPES SIMPLEX VIRUS INFECTION: Herpes simplex virus (HSV) is a viral infection that causes painful sores. We will perform an HSV swab test to confirm the presence of herpes and conduct resistance testing for Valtrex if positive. We will also perform syphilis and HIV blood tests. If the syphilis test is negative and warts are suspected, we may refer you to a dermatologist or gynecologist.  -GENITAL WARTS: Genital warts are growths that appear on the genital area and are caused by certain types of human papillomavirus (HPV). We suspect the lesions might be warts or syphilitic lesions. We will perform a syphilis blood test and, if syphilis is confirmed, start treatment with penicillin shots. If the syphilis test is negative, we may refer you to a dermatologist or gynecologist for further evaluation.  -CHRONIC OBSTRUCTIVE PULMONARY DISEASE (COPD): COPD is a chronic lung disease that makes it hard to breathe. Your COPD is managed with Spiriva  and albuterol  as needed.  -BREAST CANCER: You have a history of breast cancer, and it is currently managed with letrozole .  -DEPRESSION: Depression is a mood disorder that causes persistent feelings of sadness and loss of interest. Your depression is managed with Wellbutrin  under the care of psychiatrist Dr. Monnie Anthony.  -HYPOTHYROIDISM: Hypothyroidism is a condition where the thyroid  gland does not produce enough thyroid  hormone. Your hypothyroidism is managed with levothyroxine .  -HYPERTENSION: Hypertension is high blood pressure. Your hypertension is managed with amlodipine  and  prazosin .  -HYPERLIPIDEMIA: Hyperlipidemia is having high levels of fats in the blood. Your hyperlipidemia is managed with atorvastatin.  -GASTROESOPHAGEAL REFLUX DISEASE (GERD): GERD is a condition where stomach acid frequently flows back into the tube connecting your mouth and stomach. Your GERD is managed with omeprazole.  -OSTEOPOROSIS: Osteoporosis is a condition that weakens bones, making them fragile and more likely to break. Your osteoporosis is managed with alendronate, Caltrate, and vitamin D .  INSTRUCTIONS:  We will contact you with the test results and coordinate with Dr. Nilda Barthel for further management based on the outcomes. If necessary, we will refer you to appropriate specialists based on the test results.

## 2023-07-28 LAB — RPR: RPR Ser Ql: NONREACTIVE

## 2023-07-29 ENCOUNTER — Other Ambulatory Visit: Payer: Self-pay | Admitting: Infectious Diseases

## 2023-07-29 DIAGNOSIS — N949 Unspecified condition associated with female genital organs and menstrual cycle: Secondary | ICD-10-CM

## 2023-07-29 LAB — HSV DNA BY PCR (REFERENCE LAB)
HSV 1 DNA: NEGATIVE
HSV 2 DNA: NEGATIVE

## 2023-07-30 LAB — T.PALLIDUM AB, TOTAL: T Pallidum Abs: NONREACTIVE

## 2023-08-03 DIAGNOSIS — I10 Essential (primary) hypertension: Secondary | ICD-10-CM | POA: Diagnosis not present

## 2023-08-03 DIAGNOSIS — Z139 Encounter for screening, unspecified: Secondary | ICD-10-CM | POA: Diagnosis not present

## 2023-08-03 DIAGNOSIS — F17210 Nicotine dependence, cigarettes, uncomplicated: Secondary | ICD-10-CM | POA: Diagnosis not present

## 2023-08-03 DIAGNOSIS — Z5986 Financial insecurity: Secondary | ICD-10-CM | POA: Diagnosis not present

## 2023-08-03 DIAGNOSIS — Z9189 Other specified personal risk factors, not elsewhere classified: Secondary | ICD-10-CM | POA: Diagnosis not present

## 2023-08-03 DIAGNOSIS — C50212 Malignant neoplasm of upper-inner quadrant of left female breast: Secondary | ICD-10-CM | POA: Diagnosis not present

## 2023-08-03 DIAGNOSIS — Z170 Estrogen receptor positive status [ER+]: Secondary | ICD-10-CM | POA: Diagnosis not present

## 2023-08-03 DIAGNOSIS — Z7185 Encounter for immunization safety counseling: Secondary | ICD-10-CM | POA: Diagnosis not present

## 2023-08-09 DIAGNOSIS — C519 Malignant neoplasm of vulva, unspecified: Secondary | ICD-10-CM | POA: Insufficient documentation

## 2023-08-10 ENCOUNTER — Ambulatory Visit: Payer: 59 | Admitting: Psychiatry

## 2023-08-12 ENCOUNTER — Telehealth: Payer: Self-pay | Admitting: Psychiatry

## 2023-08-12 DIAGNOSIS — F250 Schizoaffective disorder, bipolar type: Secondary | ICD-10-CM

## 2023-08-12 DIAGNOSIS — F4310 Post-traumatic stress disorder, unspecified: Secondary | ICD-10-CM

## 2023-08-12 MED ORDER — HYDROXYZINE HCL 10 MG PO TABS: 20.0000 mg | ORAL_TABLET | Freq: Every evening | ORAL | 0 refills | Status: DC | PRN

## 2023-08-12 NOTE — Telephone Encounter (Signed)
 Patient had appointment on 08-10-23 but did not show. She came on 08-12-23 instead. Got her rescheduled to 09-09-23 but is unsure of her med refills. Please verify. She did also mention her phone is not working so not sure how to get her information.

## 2023-08-12 NOTE — Telephone Encounter (Signed)
 I have sent hydroxyzine  as needed to pharmacy.

## 2023-08-18 ENCOUNTER — Inpatient Hospital Stay

## 2023-08-18 ENCOUNTER — Inpatient Hospital Stay: Attending: Obstetrics and Gynecology | Admitting: Obstetrics and Gynecology

## 2023-08-18 VITALS — BP 126/66 | HR 80 | Temp 98.3°F | Resp 19 | Wt 185.0 lb

## 2023-08-18 DIAGNOSIS — Z803 Family history of malignant neoplasm of breast: Secondary | ICD-10-CM | POA: Diagnosis not present

## 2023-08-18 DIAGNOSIS — Z9079 Acquired absence of other genital organ(s): Secondary | ICD-10-CM | POA: Diagnosis not present

## 2023-08-18 DIAGNOSIS — Z90710 Acquired absence of both cervix and uterus: Secondary | ICD-10-CM | POA: Insufficient documentation

## 2023-08-18 DIAGNOSIS — F419 Anxiety disorder, unspecified: Secondary | ICD-10-CM | POA: Diagnosis not present

## 2023-08-18 DIAGNOSIS — Z91410 Personal history of adult physical and sexual abuse: Secondary | ICD-10-CM | POA: Diagnosis not present

## 2023-08-18 DIAGNOSIS — F250 Schizoaffective disorder, bipolar type: Secondary | ICD-10-CM | POA: Insufficient documentation

## 2023-08-18 DIAGNOSIS — J449 Chronic obstructive pulmonary disease, unspecified: Secondary | ICD-10-CM | POA: Insufficient documentation

## 2023-08-18 DIAGNOSIS — Z85820 Personal history of malignant melanoma of skin: Secondary | ICD-10-CM | POA: Diagnosis not present

## 2023-08-18 DIAGNOSIS — Z170 Estrogen receptor positive status [ER+]: Secondary | ICD-10-CM | POA: Diagnosis not present

## 2023-08-18 DIAGNOSIS — Z79890 Hormone replacement therapy (postmenopausal): Secondary | ICD-10-CM | POA: Insufficient documentation

## 2023-08-18 DIAGNOSIS — E039 Hypothyroidism, unspecified: Secondary | ICD-10-CM | POA: Insufficient documentation

## 2023-08-18 DIAGNOSIS — Z79899 Other long term (current) drug therapy: Secondary | ICD-10-CM | POA: Insufficient documentation

## 2023-08-18 DIAGNOSIS — M1990 Unspecified osteoarthritis, unspecified site: Secondary | ICD-10-CM | POA: Diagnosis not present

## 2023-08-18 DIAGNOSIS — I10 Essential (primary) hypertension: Secondary | ICD-10-CM | POA: Insufficient documentation

## 2023-08-18 DIAGNOSIS — C519 Malignant neoplasm of vulva, unspecified: Secondary | ICD-10-CM | POA: Diagnosis present

## 2023-08-18 DIAGNOSIS — F4310 Post-traumatic stress disorder, unspecified: Secondary | ICD-10-CM | POA: Insufficient documentation

## 2023-08-18 DIAGNOSIS — I739 Peripheral vascular disease, unspecified: Secondary | ICD-10-CM | POA: Diagnosis not present

## 2023-08-18 DIAGNOSIS — Z90722 Acquired absence of ovaries, bilateral: Secondary | ICD-10-CM | POA: Diagnosis not present

## 2023-08-18 DIAGNOSIS — Z79811 Long term (current) use of aromatase inhibitors: Secondary | ICD-10-CM | POA: Insufficient documentation
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
## 2023-08-19 ENCOUNTER — Telehealth: Payer: Self-pay | Admitting: Oncology

## 2023-08-19 NOTE — Telephone Encounter (Signed)
 Called patient to give details of upcoming PET scan. No answer left a voicemail

## 2023-08-24 ENCOUNTER — Encounter

## 2023-08-24 ENCOUNTER — Telehealth: Payer: Self-pay

## 2023-08-24 NOTE — Telephone Encounter (Signed)
 Called Patricia Good with concerns regarding her cancelling her appointments for PET and radiation consult. I had talked to her on Friday regarding her concerns for radiation and she had agreed to have the PET and to at least consult with radiation. She had been offered another follow up with gyn onc to voice her treatment concerns. She called yesterday and cancelled her appointments. At this time, she voiced that she is still thinking about what she wants to do. Again offered her an appointment with gyn onc to voice her concerns and she declined. I have encouraged her to call us  to reschedule as we are concerned regarding her receiving treatment for her vulvar cancer.

## 2023-08-31 ENCOUNTER — Institutional Professional Consult (permissible substitution): Admitting: Radiation Oncology

## 2023-09-06 DIAGNOSIS — Z7189 Other specified counseling: Secondary | ICD-10-CM | POA: Diagnosis not present

## 2023-09-06 DIAGNOSIS — M810 Age-related osteoporosis without current pathological fracture: Secondary | ICD-10-CM | POA: Diagnosis not present

## 2023-09-06 DIAGNOSIS — Z0000 Encounter for general adult medical examination without abnormal findings: Secondary | ICD-10-CM | POA: Diagnosis not present

## 2023-09-06 DIAGNOSIS — I10 Essential (primary) hypertension: Secondary | ICD-10-CM | POA: Diagnosis not present

## 2023-09-06 DIAGNOSIS — E039 Hypothyroidism, unspecified: Secondary | ICD-10-CM | POA: Diagnosis not present

## 2023-09-06 DIAGNOSIS — J302 Other seasonal allergic rhinitis: Secondary | ICD-10-CM | POA: Diagnosis not present

## 2023-09-06 DIAGNOSIS — Z1389 Encounter for screening for other disorder: Secondary | ICD-10-CM | POA: Diagnosis not present

## 2023-09-06 DIAGNOSIS — K219 Gastro-esophageal reflux disease without esophagitis: Secondary | ICD-10-CM | POA: Diagnosis not present

## 2023-09-06 DIAGNOSIS — J449 Chronic obstructive pulmonary disease, unspecified: Secondary | ICD-10-CM | POA: Diagnosis not present

## 2023-09-07 ENCOUNTER — Other Ambulatory Visit: Payer: Self-pay

## 2023-09-09 ENCOUNTER — Encounter: Payer: Self-pay | Admitting: Psychiatry

## 2023-09-09 ENCOUNTER — Ambulatory Visit (INDEPENDENT_AMBULATORY_CARE_PROVIDER_SITE_OTHER): Admitting: Psychiatry

## 2023-09-09 ENCOUNTER — Other Ambulatory Visit: Payer: Self-pay

## 2023-09-09 VITALS — BP 125/81 | HR 71 | Temp 98.4°F | Ht 61.0 in | Wt 186.8 lb

## 2023-09-09 DIAGNOSIS — F4320 Adjustment disorder, unspecified: Secondary | ICD-10-CM | POA: Diagnosis not present

## 2023-09-09 DIAGNOSIS — F250 Schizoaffective disorder, bipolar type: Secondary | ICD-10-CM | POA: Diagnosis not present

## 2023-09-09 DIAGNOSIS — F4010 Social phobia, unspecified: Secondary | ICD-10-CM | POA: Diagnosis not present

## 2023-09-09 DIAGNOSIS — F4310 Post-traumatic stress disorder, unspecified: Secondary | ICD-10-CM

## 2023-09-09 DIAGNOSIS — F17200 Nicotine dependence, unspecified, uncomplicated: Secondary | ICD-10-CM

## 2023-09-09 NOTE — Progress Notes (Signed)
 [facility] MD OP Progress Note  09/09/2023 2:23 PM Patricia Good  MRN:  [id number]  Chief Complaint:  Chief Complaint  Patient presents with   Follow-up   Anxiety   Medication Refill   Halluciantion   Discussed the use of AI scribe software for clinical note transcription with the patient, who gave verbal consent to proceed.  History of Present Illness Patricia Good is a 63 year old Caucasian female, single, lives in [place], has a history of schizoaffective disorder, PTSD, social anxiety disorder, COPD, vitamin B12 deficiency, psoriasis, history of malignant melanoma, hypothyroidism, osteoarthritis, stage I.  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  She was diagnosed with vulvar cancer approximately three weeks ago. She has been presented with treatment options including surgery or radiation and chemotherapy. She is concerned about the potential side effects of radiation, particularly in sensitive areas, and is considering surgery despite the possibility of requiring a colostomy bag post-operatively.  She experiences painful bowel movements and burning during urination, which she attributes to an open cancerous lesion. Initially, she believed the lesion was herpes and had been taking Valtrex for years until a gynecologist performed a biopsy and diagnosed her with cancer. She has since discontinued Valtrex.  Her social support is limited; she lives alone in a rooming house and has neighbors who keep to themselves. Her sister is occupied with caring for their bedridden mother, who is in poor health. She plans to arrange for her own transportation by September and is considering a stay in rehab for four to six weeks post-surgery.  She reports a fair appetite and no significant depression or anxiety, although she is not currently seeing a  therapist. She continues to take Wellbutrin  150 mg and 300 mg, hydroxyzine  as needed, and olanzapine  5 mg at bedtime.  She denies any side effects to her medications.  She is agreeable to finding a psychotherapist however would like to discuss this option with her oncologist/gynecologist since she does not believe she can call outside resources to find a therapist at this time.  She denies any suicidality, homicidality or perceptual disturbances.    Visit Diagnosis:    ICD-10-CM   1. Schizoaffective disorder, bipolar type (HCC)  F25.0     2. PTSD (post-traumatic stress disorder)  F43.10     3. Social anxiety disorder  F40.10     4. Adjustment disorder, unspecified type  F43.20     5. Tobacco use disorder  F17.200       Past Psychiatric History: I have reviewed past psychiatric history from progress note on 07/16/2017.  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   Personal history of radiation therapy    Pre-diabetes    PTSD (post-traumatic stress disorder)    Schizoaffective disorder [facility])     Past Surgical History:  Procedure Laterality Date   BREAST BIOPSY Left 03/03/2022   Left breast stereo bx  Ribbon Clip- path pending   BREAST BIOPSY Left 03/03/2022   MM LT BREAST BX W LOC DEV 1ST LESION IMAGE BX SPEC STEREO GUIDE 03/03/2022 [facility]   BREAST LUMPECTOMY Left 03/20/2022   ECTOPIC PREGNANCY SURGERY     PART MASTECTOMY,RADIO FREQUENCY LOCALIZER,AXILLARY SENTINEL NODE BIOPSY Left 03/20/2022   Procedure: PART MASTECTOMY,RADIO FREQUENCY LOCALIZER,AXILLARY  SENTINEL NODE BIOPSY;  Surgeon: Eldred Grego, MD;  Location: [facility] ORS;  Service: General;  Laterality: Left;   TOTAL ABDOMINAL HYSTERECTOMY W/ BILATERAL SALPINGOOPHORECTOMY  1990    Family Psychiatric History: I have reviewed family psychiatric history from progress note on 07/16/2017.  Family History:  Family History  Problem Relation Age of Onset   Hypertension Father    Diabetes Father    Heart disease Father    Blindness Father    Alcohol abuse Sister    Drug abuse Sister    Anxiety disorder Sister    Depression Sister    Obesity Brother    Arthritis Brother    Alcohol abuse Brother    Drug abuse Brother    Depression Brother    Breast cancer Cousin    Clotting disorder Neg Hx     Social History: I have reviewed the history from progress note on 07/16/2017. Social History   Socioeconomic History   Marital status: Single    Spouse name: Not on file   Number of children: 0   Years of education: Not on file   Highest education level: Associate degree: occupational, Scientist, product/process development, or vocational program  Occupational History   Not on file  Tobacco Use   Smoking status: Every Day    Current packs/day: 0.50    Average packs/day: 0.5 packs/day for 54.6 years (27.3 ttl pk-yrs)    Types: Cigarettes    Start date: 01/22/1969   Smokeless tobacco: Never   Tobacco comments:    Continues to go down on number per day to 5-7 per day.   Vaping Use   Vaping status: Never Used  Substance and Sexual Activity   Alcohol use: Not Currently    Alcohol/week: 1.0 - 2.0 standard drink of alcohol    Types: 1 Glasses of wine per week    Comment: occasional   Drug use: No   Sexual activity: Not Currently    Birth control/protection: None  Other Topics Concern   Not on file  Social History Narrative   Lives alone   Social Drivers of Health   Financial Resource Strain: Medium Risk (08/03/2023)   Received from [facility]   Overall Financial Resource Strain  (CARDIA)    Difficulty of Paying Living Expenses: Somewhat hard  Food Insecurity: No Food Insecurity (08/19/2023)   Hunger Vital Sign    Worried About Running Out of Food in the Last Year: Never true    Ran Out of Food in the Last Year: Never true  Transportation Needs: No Transportation Needs (08/19/2023)   PRAPARE - Administrator, Civil Service (Medical): No    Lack of Transportation (Non-Medical): No  Physical Activity: Inactive (06/15/2017)   Exercise Vital Sign    Days of Exercise per Week: 0 days    Minutes of Exercise per Session: 0 min  Stress: Stress Concern Present (06/15/2017)   [employer] of Occupational Health - Occupational Stress Questionnaire    Feeling of Stress : Very much  Social Connections: Socially Isolated (06/15/2017)   Social Connection and Isolation Panel [NHANES]    Frequency of Communication with Friends and  Family: Never    Frequency of Social Gatherings with Friends and Family: Never    Attends Religious Services: Never    Database administrator or Organizations: No    Attends Banker Meetings: Never    Marital Status: Divorced    Allergies:  Allergies  Allergen Reactions   Shellfish Allergy Anaphylaxis    Only to crab meat (non-imitation), not shrimp   Sulfa Antibiotics Hives, Anaphylaxis and Other (See Comments)   Sulfasalazine Anaphylaxis and Hives    Other reaction(s): Other (See Comments)   Lisinopril     unknown    Metabolic Disorder Labs: Lab Results  Component Value Date   HGBA1C 5.7 (H) 05/06/2020   MPG 116.89 05/06/2020   MPG 117 12/21/2017   Lab Results  Component Value Date   PROLACTIN 1.3 (L) 05/06/2020   PROLACTIN 1.2 (L) 07/12/2017   Lab Results  Component Value Date   CHOL 193 05/06/2020   TRIG 103 05/06/2020   HDL 56 05/06/2020   CHOLHDL 3.4 05/06/2020   VLDL 21 05/06/2020   LDLCALC 116 (H) 05/06/2020   LDLCALC 163 (H) 12/21/2017   Lab Results  Component Value Date   TSH 2.250  06/17/2021   TSH 2.143 05/06/2020    Therapeutic Level Labs: No results found for: "LITHIUM" No results found for: "VALPROATE" No results found for: "CBMZ"  Current Medications: Current Outpatient Medications  Medication Sig Dispense Refill   fluticasone  (FLONASE ) 50 MCG/ACT nasal spray Place into both nostrils as needed for allergies or rhinitis.     albuterol  (VENTOLIN  HFA) 108 (90 Base) MCG/ACT inhaler Inhale into the lungs.     alendronate (FOSAMAX) 70 MG tablet Take 70 mg by mouth once a week.     amLODipine  (NORVASC ) 10 MG tablet Take 10 mg by mouth daily.     atorvastatin (LIPITOR) 20 MG tablet Take 20 mg by mouth at bedtime.     B Complex-C (B-COMPLEX WITH VITAMIN C) tablet Take 1 tablet by mouth daily.     buPROPion  (WELLBUTRIN  XL) 150 MG 24 hr tablet Take 1 tablet (150 mg total) by mouth daily. Take along with 300 mg daily , total of 450 mg daily 90 tablet 1   buPROPion  (WELLBUTRIN  XL) 300 MG 24 hr tablet Take 1 tablet (300 mg total) by mouth daily. Take along with 150 mg daily , total of 450 mg daily 90 tablet 1   Calcium  Carb-Cholecalciferol 500-10 MG-MCG TABS Take 1 tablet by mouth.     calcium -vitamin D  (OSCAL WITH D) 500-200 MG-UNIT tablet Take 1 tablet by mouth daily with breakfast.     furosemide  (LASIX ) 20 MG tablet Take 20 mg by mouth daily.      hydrOXYzine  (ATARAX ) 10 MG tablet Take 2 tablets (20 mg total) by mouth at bedtime as needed. for sleep and anxiety 180 tablet 0   imiquimod (ALDARA) 5 % cream SMARTSIG:Topical (Patient not taking: Reported on 09/09/2023)     letrozole  (FEMARA ) 2.5 MG tablet TAKE 1 TABLET BY MOUTH DAILY 30 tablet 3   letrozole  (FEMARA ) 2.5 MG tablet Take 1 tablet (2.5 mg total) by mouth daily. 30 tablet 3   levothyroxine  (SYNTHROID , LEVOTHROID) 112 MCG tablet      meloxicam (MOBIC) 15 MG tablet Take 1 tablet by mouth daily.     OLANZapine  (ZYPREXA ) 5 MG tablet Take 1 tablet (5 mg total) by mouth at bedtime. 90 tablet 3   omeprazole (PRILOSEC)  40 MG capsule Take 40 mg by  mouth daily.      prazosin  (MINIPRESS ) 2 MG capsule TAKE ONE CAPSULE BY MOUTH AT BEDTIME. 90 capsule 1   PREVIDENT 5000 DRY MOUTH 1.1 % GEL dental gel Place onto teeth.     SPIRIVA  HANDIHALER 18 MCG inhalation capsule      valACYclovir (VALTREX) 1000 MG tablet  (Patient not taking: Reported on 09/09/2023)     No current facility-administered medications for this visit.     Musculoskeletal: Strength & Muscle Tone: within normal limits Gait & Station: normal Patient leans: N/A  Psychiatric Specialty Exam: Review of Systems  Psychiatric/Behavioral:  The patient is nervous/anxious.     Blood pressure 125/81, pulse 71, temperature 98.4 F (36.9 C), temperature source Temporal, height 5\' 1"  (1.549 m), weight 186 lb 12.8 oz (84.7 kg).Body mass index is 35.3 kg/m.  General Appearance: Casual  Eye Contact:  Fair  Speech:  Clear and Coherent  Volume:  Normal  Mood:  Anxious  Affect:  Congruent  Thought Process:  Goal Directed and Descriptions of Associations: Intact  Orientation:  Full (Time, Place, and Person)  Thought Content: Logical   Suicidal Thoughts:  No  Homicidal Thoughts:  No  Memory:  Immediate;   Fair Recent;   Fair Remote;   Fair  Judgement:  Fair  Insight:  Fair  Psychomotor Activity:  Normal  Concentration:  Concentration: Fair and Attention Span: Fair  Recall:  [employer] of Knowledge: Fair  Language: Fair  Akathisia:  No  Handed:  Right  AIMS (if indicated): done  Assets:  Communication Skills Desire for Improvement Housing Transportation  ADL's:  Intact  Cognition: WNL  Sleep:  Fair   Screenings: Midwife Visit from 09/09/2023 in [facility] Office Visit from 06/01/2023 in [facility] Office Visit from 04/15/2023 in [facility] Office Visit from 01/13/2023 in [facility] Office Visit from 08/26/2022 in [facility]  AIMS Total Score 0 0 0 0 0      AUDIT    Flowsheet Row Admission (Discharged) from 12/22/2017 in [facility]  Alcohol Use Disorder Identification Test Final Score (AUDIT) 1      GAD-7    Flowsheet Row Office Visit from 06/01/2023 in [facility] Office Visit from 04/15/2023 in [facility] Office Visit from 01/13/2023 in [facility] Office Visit from 08/26/2022 in [facility] Office Visit from 06/18/2022 in [facility]  Total GAD-7 Score 4 2 2 1 3       PHQ2-9    Flowsheet Row Office Visit from 08/18/2023 in [facility] [place] Office Visit from 06/01/2023 in [facility] Office Visit from 04/15/2023 in [facility] Office Visit from 01[facility]13/2023 in [facility] Office Visit from 08/26/2022 in [facility]  PHQ-2 Total Score 0 1 4 0 0  PHQ-9 Total Score -- 4 12 -- 1      Flowsheet Row Office Visit from 09/09/2023 in [facility] Office Visit from 06/01/2023 in [facility] Office Visit from 04/15/2023 in [facility]  C-SSRS RISK  CATEGORY Moderate Risk No Risk No Risk        Assessment and Plan: Patricia Good is a 63 year old Caucasian female who was evaluated in office today for a follow-up appointment.  Discussed assessment and plan as noted below.  Schizoaffective disorder-stable Currently denies any perceptual disturbances and is compliant on  medications like olanzapine . Continue Olanzapine  5 mg daily Continue Wellbutrin  450 mg daily  Posttraumatic stress disorder-stable Currently denies any significant intrusive memories flashbacks or nightmares. Continue Prazosin  2 mg at bedtime  Social anxiety disorder-unstable Patient with chronic social anxiety, stays to herself with limited social contact Patient agreeable to find a psychotherapist since she also has her cancer diagnosis. She agrees to discuss with oncologist. Continue Hydroxyzine  10 to 20 mg at bedtime as needed  Tobacco use disorder-unstable Patient currently used to smoke half a pack of cigarettes per day. Not interested in quitting.  Adjustment disorder unspecified-unstable Patient with anxiety regarding her recent cancer diagnosis as well as her mother who is terminally ill..  She is not interested in further medication management at this time and agrees to establish care with  therapist to start psychotherapy. Patient is not interested in resources for therapist in the community and would like to discuss with gynecologist/oncologist.  Follow-up Follow-up in clinic in 4 weeks or sooner if needed.   Collaboration of Care: Collaboration of Care: Referral or follow-up with counselor/therapist AEB patient encouraged to establish care with the therapist, patient declines resources in the community and would like to discuss with her oncologist/gynecologist.  Discussed with patient that our in-house therapist are currently not accepting patients.  Patient/Guardian was advised Release of Information must be obtained prior to any record release in order to collaborate their care with an outside provider. Patient/Guardian was advised if they have not already done so to contact the registration department to sign all necessary forms in order for us  to release information regarding their care.   Consent: Patient/Guardian gives verbal consent for treatment and assignment of  benefits for services provided during this visit. Patient/Guardian expressed understanding and agreed to proceed.   This note was generated in part or whole with voice recognition software. Voice recognition is usually quite accurate but there are transcription errors that can and very often do occur. I apologize for any typographical errors that were not detected and corrected.    Moataz Tavis, MD 09/09/2023, 2:23 PM

## 2023-09-10 ENCOUNTER — Ambulatory Visit
Admission: RE | Admit: 2023-09-10 | Discharge: 2023-09-10 | Disposition: A | Discharge status: Home / Self Care | Source: Ambulatory Visit | Attending: Nurse Practitioner | Admitting: Nurse Practitioner

## 2023-09-10 ENCOUNTER — Other Ambulatory Visit: Payer: Self-pay | Admitting: Psychiatry

## 2023-09-10 DIAGNOSIS — N9089 Other specified noninflammatory disorders of vulva and perineum: Secondary | ICD-10-CM | POA: Insufficient documentation

## 2023-09-10 DIAGNOSIS — C519 Malignant neoplasm of vulva, unspecified: Secondary | ICD-10-CM | POA: Diagnosis present

## 2023-09-10 DIAGNOSIS — Z853 Personal history of malignant neoplasm of breast: Secondary | ICD-10-CM | POA: Diagnosis not present

## 2023-09-10 DIAGNOSIS — M47816 Spondylosis without myelopathy or radiculopathy, lumbar region: Secondary | ICD-10-CM | POA: Diagnosis not present

## 2023-09-10 DIAGNOSIS — F250 Schizoaffective disorder, bipolar type: Secondary | ICD-10-CM

## 2023-09-10 DIAGNOSIS — I700 Atherosclerosis of aorta: Secondary | ICD-10-CM | POA: Insufficient documentation

## 2023-09-10 LAB — GLUCOSE, CAPILLARY: Glucose-Capillary: 128 mg/dL — ABNORMAL HIGH (ref 70–99)

## 2023-09-10 MED ORDER — FLUDEOXYGLUCOSE F - 18 (FDG) INJECTION
10.0500 | Freq: Once | INTRAVENOUS | Status: AC | PRN
Start: 2023-09-10 — End: 2023-09-10
  Administered 2023-09-10: 10.05 via INTRAVENOUS

## 2023-09-13 ENCOUNTER — Telehealth: Payer: Self-pay

## 2023-09-13 NOTE — Telephone Encounter (Signed)
 Call returned to Patricia Good. Notified that results of PET are not available yet. She states she feels her vulvar cancer has gotten worse and that it has gotten painful to bath the area. She did not see radiation. She cancelled this appointment stating she cannot tolerate radiation. Offered appointment to follow up with gyn oncology. Appointment accepted 6/11 at 1100 with Dr. Randalyn Bushman.

## 2023-09-15 ENCOUNTER — Inpatient Hospital Stay: Admitting: Nurse Practitioner

## 2023-09-15 ENCOUNTER — Telehealth: Payer: Self-pay | Admitting: *Deleted

## 2023-09-15 ENCOUNTER — Inpatient Hospital Stay

## 2023-09-15 ENCOUNTER — Other Ambulatory Visit: Payer: Self-pay | Admitting: Lab

## 2023-09-15 ENCOUNTER — Other Ambulatory Visit (HOSPITAL_BASED_OUTPATIENT_CLINIC_OR_DEPARTMENT_OTHER): Payer: Self-pay | Admitting: Nurse Practitioner

## 2023-09-15 ENCOUNTER — Inpatient Hospital Stay: Attending: Obstetrics and Gynecology | Admitting: Obstetrics and Gynecology

## 2023-09-15 VITALS — BP 130/73 | HR 87 | Resp 18 | Ht 61.0 in | Wt 184.0 lb

## 2023-09-15 DIAGNOSIS — E66812 Obesity, class 2: Secondary | ICD-10-CM | POA: Insufficient documentation

## 2023-09-15 DIAGNOSIS — L02429 Furuncle of limb, unspecified: Secondary | ICD-10-CM | POA: Insufficient documentation

## 2023-09-15 DIAGNOSIS — M159 Polyosteoarthritis, unspecified: Secondary | ICD-10-CM | POA: Insufficient documentation

## 2023-09-15 DIAGNOSIS — Z79899 Other long term (current) drug therapy: Secondary | ICD-10-CM | POA: Insufficient documentation

## 2023-09-15 DIAGNOSIS — Z1721 Progesterone receptor positive status: Secondary | ICD-10-CM | POA: Diagnosis not present

## 2023-09-15 DIAGNOSIS — M810 Age-related osteoporosis without current pathological fracture: Secondary | ICD-10-CM | POA: Insufficient documentation

## 2023-09-15 DIAGNOSIS — C50212 Malignant neoplasm of upper-inner quadrant of left female breast: Secondary | ICD-10-CM | POA: Insufficient documentation

## 2023-09-15 DIAGNOSIS — C519 Malignant neoplasm of vulva, unspecified: Secondary | ICD-10-CM

## 2023-09-15 DIAGNOSIS — A6004 Herpesviral vulvovaginitis: Secondary | ICD-10-CM | POA: Insufficient documentation

## 2023-09-15 DIAGNOSIS — E039 Hypothyroidism, unspecified: Secondary | ICD-10-CM | POA: Insufficient documentation

## 2023-09-15 DIAGNOSIS — F17210 Nicotine dependence, cigarettes, uncomplicated: Secondary | ICD-10-CM | POA: Diagnosis not present

## 2023-09-15 DIAGNOSIS — C774 Secondary and unspecified malignant neoplasm of inguinal and lower limb lymph nodes: Secondary | ICD-10-CM | POA: Insufficient documentation

## 2023-09-15 DIAGNOSIS — R102 Pelvic and perineal pain: Secondary | ICD-10-CM | POA: Diagnosis not present

## 2023-09-15 DIAGNOSIS — Z170 Estrogen receptor positive status [ER+]: Secondary | ICD-10-CM | POA: Insufficient documentation

## 2023-09-15 DIAGNOSIS — F606 Avoidant personality disorder: Secondary | ICD-10-CM | POA: Insufficient documentation

## 2023-09-15 DIAGNOSIS — Z7189 Other specified counseling: Secondary | ICD-10-CM

## 2023-09-15 DIAGNOSIS — Z1732 Human epidermal growth factor receptor 2 negative status: Secondary | ICD-10-CM | POA: Insufficient documentation

## 2023-09-15 DIAGNOSIS — R7989 Other specified abnormal findings of blood chemistry: Secondary | ICD-10-CM

## 2023-09-15 DIAGNOSIS — G893 Neoplasm related pain (acute) (chronic): Secondary | ICD-10-CM | POA: Insufficient documentation

## 2023-09-15 DIAGNOSIS — Z6835 Body mass index (BMI) 35.0-35.9, adult: Secondary | ICD-10-CM | POA: Insufficient documentation

## 2023-09-15 DIAGNOSIS — I10 Essential (primary) hypertension: Secondary | ICD-10-CM

## 2023-09-15 DIAGNOSIS — J449 Chronic obstructive pulmonary disease, unspecified: Secondary | ICD-10-CM | POA: Insufficient documentation

## 2023-09-15 LAB — CBC WITH DIFFERENTIAL/PLATELET
Abs Immature Granulocytes: 0.07 10*3/uL (ref 0.00–0.07)
Basophils Absolute: 0.1 10*3/uL (ref 0.0–0.1)
Basophils Relative: 1 %
Eosinophils Absolute: 0.8 10*3/uL — ABNORMAL HIGH (ref 0.0–0.5)
Eosinophils Relative: 10 %
HCT: 37 % (ref 36.0–46.0)
Hemoglobin: 12.7 g/dL (ref 12.0–15.0)
Immature Granulocytes: 1 %
Lymphocytes Relative: 30 %
Lymphs Abs: 2.5 10*3/uL (ref 0.7–4.0)
MCH: 36.5 pg — ABNORMAL HIGH (ref 26.0–34.0)
MCHC: 34.3 g/dL (ref 30.0–36.0)
MCV: 106.3 fL — ABNORMAL HIGH (ref 80.0–100.0)
Monocytes Absolute: 0.8 10*3/uL (ref 0.1–1.0)
Monocytes Relative: 10 %
Neutro Abs: 4.1 10*3/uL (ref 1.7–7.7)
Neutrophils Relative %: 48 %
Platelets: 221 10*3/uL (ref 150–400)
RBC: 3.48 MIL/uL — ABNORMAL LOW (ref 3.87–5.11)
RDW: 11.4 % — ABNORMAL LOW (ref 11.5–15.5)
WBC: 8.4 10*3/uL (ref 4.0–10.5)
nRBC: 0 % (ref 0.0–0.2)

## 2023-09-15 LAB — COMPREHENSIVE METABOLIC PANEL WITH GFR
ALT: 57 U/L — ABNORMAL HIGH (ref 0–44)
AST: 57 U/L — ABNORMAL HIGH (ref 15–41)
Albumin: 3.8 g/dL (ref 3.5–5.0)
Alkaline Phosphatase: 227 U/L — ABNORMAL HIGH (ref 38–126)
Anion gap: 7 (ref 5–15)
BUN: 13 mg/dL (ref 8–23)
CO2: 29 mmol/L (ref 22–32)
Calcium: 8.6 mg/dL — ABNORMAL LOW (ref 8.9–10.3)
Chloride: 97 mmol/L — ABNORMAL LOW (ref 98–111)
Creatinine, Ser: 1.2 mg/dL — ABNORMAL HIGH (ref 0.44–1.00)
GFR, Estimated: 50 mL/min — ABNORMAL LOW (ref >60)
Glucose, Bld: 92 mg/dL (ref 70–99)
Potassium: 4.2 mmol/L (ref 3.5–5.1)
Sodium: 133 mmol/L — ABNORMAL LOW (ref 135–145)
Total Bilirubin: 0.6 mg/dL (ref 0.0–1.2)
Total Protein: 7.5 g/dL (ref 6.5–8.1)

## 2023-09-15 MED ORDER — OXYCODONE HCL 5 MG PO TABS: 5.0000 mg | ORAL_TABLET | Freq: Three times a day (TID) | ORAL | 0 refills | Status: DC | PRN

## 2023-09-15 MED ORDER — CEPHALEXIN 500 MG PO CAPS: 500.0000 mg | ORAL_CAPSULE | Freq: Four times a day (QID) | ORAL | 0 refills | Status: AC

## 2023-09-15 NOTE — Progress Notes (Signed)
 Virtual Visit Progress Note  Symptom Management Clinic  [facility] A Department of the [facility] [address], [address] [place], [place] [address] [phone] (phone) [phone] (fax)  I connected with Patricia Good on 09/15/23 at  1:30 PM EDT by telephone visit and verified that I am speaking with the correct person using two identifiers.   I discussed the limitations, risks, security and privacy concerns of performing an evaluation and management service by telemedicine and the availability of in-person appointments. I also discussed with the patient that there may be a patient responsible charge related to this service. The patient expressed understanding and agreed to proceed.   Other persons participating in the visit and their role in the encounter: none   Patient's location: home  Provider's location: clinic     Patient Care Team: Entzminger, Iantha Mainland, MD as PCP - General (Internal Medicine) Waverly Hageman, RN as Oncology Nurse Navigator Rochell Chroman, RN as Oncology Nurse Navigator Avonne Boettcher, MD as Consulting Physician (Oncology)   Name of the patient: Patricia Good  [id number]  25-Oct-1960   Date of visit: 09/15/23  Diagnosis- Vulvar Cancer  Chief complaint/ Reason for visit- hypocalcemia & abnormal LFTs  Heme/Onc history:  Oncology History  Malignant neoplasm of upper-inner quadrant of left breast in female, estrogen receptor positive (HCC)  03/10/2022 Initial Diagnosis   Malignant neoplasm of upper-inner quadrant of left breast in female, estrogen receptor positive (HCC)   03/10/2022 Cancer Staging   Staging form: Breast, AJCC 8th Edition - Clinical stage from 03/10/2022: Stage IA (cT1a, cN0, cM0, G2, ER+, PR+, HER2-) - Signed by Avonne Boettcher, MD on 03/10/2022 Histologic grading system: 3 grade system   04/07/2022 Cancer Staging   Staging form: Breast, AJCC 8th Edition - Pathologic stage  from 04/07/2022: Stage IA (pT1a, pN0, cM0, G3, ER+, PR+, HER2-, Oncotype DX score: 28) - Signed by Avonne Boettcher, MD on 04/13/2022 Stage prefix: Initial diagnosis Multigene prognostic tests performed: Oncotype DX Recurrence score range: Greater than or equal to 11 Histologic grading system: 3 grade system     Interval history- Patient agrees to telephone visit to discuss lab results.    Allergies  Allergen Reactions   Shellfish Allergy Anaphylaxis    Only to crab meat (non-imitation), not shrimp   Sulfa Antibiotics Hives, Anaphylaxis and Other (See Comments)   Sulfasalazine Anaphylaxis and Hives    Other reaction(s): Other (See Comments)   Lisinopril     unknown    Past Medical History:  Diagnosis Date   Abnormal thyroid  stimulating hormone (TSH) level    Anemia    Anxiety    Asthma    Bipolar 1 disorder (HCC)    COPD (chronic obstructive pulmonary disease) (HCC)    Endometriosis    GERD (gastroesophageal reflux disease)    Hepatitis A    Hypertension    Hypothyroidism    Malignant neoplasm of upper-inner quadrant of left breast in female, 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   Personal history of radiation therapy    Pre-diabetes    PTSD (post-traumatic stress disorder)    Schizoaffective disorder [facility])     Past Surgical History:  Procedure Laterality Date   BREAST BIOPSY Left 03/03/2022   Left breast stereo bx Ribbon Clip- path pending   BREAST BIOPSY Left 03/03/2022   MM LT BREAST BX W LOC DEV 1ST LESION IMAGE BX SPEC STEREO GUIDE 03/03/2022 [facility]  BREAST LUMPECTOMY Left 03/20/2022   ECTOPIC PREGNANCY SURGERY     PART MASTECTOMY,RADIO FREQUENCY LOCALIZER,AXILLARY SENTINEL NODE BIOPSY Left 03/20/2022   Procedure: PART MASTECTOMY,RADIO FREQUENCY LOCALIZER,AXILLARY SENTINEL NODE BIOPSY;  Surgeon: Eldred Grego, MD;  Location: [facility] ORS;  Service: General;  Laterality: Left;   TOTAL ABDOMINAL HYSTERECTOMY W/ BILATERAL SALPINGOOPHORECTOMY  1990     Social History   Socioeconomic History   Marital status: Single    Spouse name: Not on file   Number of children: 0   Years of education: Not on file   Highest education level: Associate degree: occupational, Scientist, product/process development, or vocational program  Occupational History   Not on file  Tobacco Use   Smoking status: Every Day    Current packs/day: 0.50    Average packs/day: 0.5 packs/day for 54.6 years (27.3 ttl pk-yrs)    Types: Cigarettes    Start date: 01/22/1969   Smokeless tobacco: Never   Tobacco comments:    Continues to go down on number per day to 5-7 per day.   Vaping Use   Vaping status: Never Used  Substance and Sexual Activity   Alcohol use: Not Currently    Alcohol/week: 1.0 - 2.0 standard drink of alcohol    Types: 1 Glasses of wine per week    Comment: occasional   Drug use: No   Sexual activity: Not Currently    Birth control/protection: None  Other Topics Concern   Not on file  Social History Narrative   Lives alone   Social Drivers of Health   Financial Resource Strain: Medium Risk (08/03/2023)   Received from [facility]   Overall Financial Resource Strain (CARDIA)    Difficulty of Paying Living Expenses: Somewhat hard  Food Insecurity: No Food Insecurity (08/19/2023)   Hunger Vital Sign    Worried About Running Out of Food in the Last Year: Never true    Ran Out of Food in the Last Year: Never true  Transportation Needs: No Transportation Needs (08/19/2023)   PRAPARE - Administrator, Civil Service (Medical): No    Lack of Transportation (Non-Medical): No  Physical Activity: Inactive (06/15/2017)   Exercise Vital Sign    Days of Exercise per Week: 0 days    Minutes of Exercise per Session: 0 min  Stress: Stress Concern Present (06/15/2017)   [employer] of Occupational Health - Occupational Stress Questionnaire    Feeling of Stress : Very much  Social Connections: Socially Isolated (06/15/2017)   Social Connection  and Isolation Panel [NHANES]    Frequency of Communication with Friends and Family: Never    Frequency of Social Gatherings with Friends and Family: Never    Attends Religious Services: Never    Database administrator or Organizations: No    Attends Banker Meetings: Never    Marital Status: Divorced  Catering manager Violence: Not At Risk (08/19/2023)   Humiliation, Afraid, Rape, and Kick questionnaire    Fear of Current or Ex-Partner: No    Emotionally Abused: No    Physically Abused: No    Sexually Abused: No    Family History  Problem Relation Age of Onset   Hypertension Father    Diabetes Father    Heart disease Father    Blindness Father    Alcohol abuse Sister    Drug abuse Sister    Anxiety disorder Sister    Depression Sister    Obesity Brother    Arthritis Brother  Alcohol abuse Brother    Drug abuse Brother    Depression Brother    Breast cancer Cousin    Clotting disorder Neg Hx      Current Outpatient Medications:    albuterol  (VENTOLIN  HFA) 108 (90 Base) MCG/ACT inhaler, Inhale into the lungs., Disp: , Rfl:    alendronate (FOSAMAX) 70 MG tablet, Take 70 mg by mouth once a week., Disp: , Rfl:    amLODipine  (NORVASC ) 10 MG tablet, Take 10 mg by mouth daily., Disp: , Rfl:    atorvastatin (LIPITOR) 20 MG tablet, Take 20 mg by mouth at bedtime., Disp: , Rfl:    B Complex-C (B-COMPLEX WITH VITAMIN C) tablet, Take 1 tablet by mouth daily., Disp: , Rfl:    buPROPion  (WELLBUTRIN  XL) 150 MG 24 hr tablet, TAKE 1 TABLET BY MOUTH DAILY ALONG WITH 300 MG TABLET FOR TOTAL OF 450 MG DAILY, Disp: 90 tablet, Rfl: 1   buPROPion  (WELLBUTRIN  XL) 300 MG 24 hr tablet, Take 1 tablet (300 mg total) by mouth daily. Take along with 150 mg daily , total of 450 mg daily, Disp: 90 tablet, Rfl: 1   Calcium  Carb-Cholecalciferol 500-10 MG-MCG TABS, Take 1 tablet by mouth., Disp: , Rfl:    calcium -vitamin D  (OSCAL WITH D) 500-200 MG-UNIT tablet, Take 1 tablet by mouth daily with  breakfast., Disp: , Rfl:    cephALEXin  (KEFLEX ) 500 MG capsule, Take 1 capsule (500 mg total) by mouth every 6 (six) hours for 7 days., Disp: 28 capsule, Rfl: 0   fluticasone  (FLONASE ) 50 MCG/ACT nasal spray, Place into both nostrils as needed for allergies or rhinitis., Disp: , Rfl:    furosemide  (LASIX ) 20 MG tablet, Take 20 mg by mouth daily. , Disp: , Rfl:    hydrOXYzine  (ATARAX ) 10 MG tablet, Take 2 tablets (20 mg total) by mouth at bedtime as needed. for sleep and anxiety, Disp: 180 tablet, Rfl: 0   imiquimod (ALDARA) 5 % cream, SMARTSIG:Topical (Patient not taking: Reported on 09/15/2023), Disp: , Rfl:    letrozole  (FEMARA ) 2.5 MG tablet, TAKE 1 TABLET BY MOUTH DAILY, Disp: 30 tablet, Rfl: 3   letrozole  (FEMARA ) 2.5 MG tablet, Take 1 tablet (2.5 mg total) by mouth daily., Disp: 30 tablet, Rfl: 3   levothyroxine  (SYNTHROID , LEVOTHROID) 112 MCG tablet, , Disp: , Rfl:    meloxicam (MOBIC) 15 MG tablet, Take 1 tablet by mouth daily., Disp: , Rfl:    OLANZapine  (ZYPREXA ) 5 MG tablet, Take 1 tablet (5 mg total) by mouth at bedtime., Disp: 90 tablet, Rfl: 3   omeprazole (PRILOSEC) 40 MG capsule, Take 40 mg by mouth daily. , Disp: , Rfl:    oxyCODONE  (OXY IR/ROXICODONE ) 5 MG immediate release tablet, Take 1 tablet (5 mg total) by mouth every 8 (eight) hours as needed for severe pain (pain score 7-10) (for pain unrelieved by tylenol )., Disp: 30 tablet, Rfl: 0   prazosin  (MINIPRESS ) 2 MG capsule, TAKE ONE CAPSULE BY MOUTH AT BEDTIME., Disp: 90 capsule, Rfl: 1   PREVIDENT 5000 DRY MOUTH 1.1 % GEL dental gel, Place onto teeth., Disp: , Rfl:    SPIRIVA  HANDIHALER 18 MCG inhalation capsule, , Disp: , Rfl:    valACYclovir (VALTREX) 1000 MG tablet, , Disp: , Rfl:   Physical exam: Exam limited due to telemedicine  There were no vitals filed for this visit. Physical Exam Pulmonary:     Effort: No respiratory distress.  Neurological:     Mental Status: She is alert and oriented to  person, place, and  time.         Latest Ref Rng & Units 09/15/2023   12:03 PM  CMP  Glucose 70 - 99 mg/dL 92   BUN 8 - 23 mg/dL 13   Creatinine 1.61 - 1.00 mg/dL 0.96   Sodium 045 - 409 mmol/L 133   Potassium 3.5 - 5.1 mmol/L 4.2   Chloride 98 - 111 mmol/L 97   CO2 22 - 32 mmol/L 29   Calcium  8.9 - 10.3 mg/dL 8.6   Total Protein 6.5 - 8.1 g/dL 7.5   Total Bilirubin 0.0 - 1.2 mg/dL 0.6   Alkaline Phos 38 - 126 U/L 227   AST 15 - 41 U/L 57   ALT 0 - 44 U/L 57       Latest Ref Rng & Units 09/15/2023   12:03 PM  CBC  WBC 4.0 - 10.5 K/uL 8.4   Hemoglobin 12.0 - 15.0 g/dL 81.1   Hematocrit 91.4 - 46.0 % 37.0   Platelets 150 - 400 K/uL 221     Assessment and plan- Patient is a 63 y.o. female    Hypocalcemia- plan to add ionized calcium  level to confirm. Unable to add on vitamin d  levels. Can draw at next visit. She is taking calcium  and vit d supplements, 1200 mg BID with 1000 mcg vit d BID. Encouraged compliance and increase to 3 tablets morning and night.  Health maintenance- encouraged patient to be compliant with prescribed medications and follow up with Dr Orlinda Blackbird for ongoing management of her chronic medical problems.  Abnormal LFTs- likely due to excessive tylenol  use. Recommend avoiding > 3000 mg of tylenol  from all sources in 24 hour period. Avoid hepatotoxic substances including aspirin  and alcohol as well.   Add lab visit on 09/22/23- la  Visit Diagnosis 1. Abnormal LFTs   2. Hypocalcemia    Patient expressed understanding and was in agreement with this plan. She also understands that She can call clinic at any time with any questions, concerns, or complaints.   I discussed the assessment and treatment plan with the patient. The patient was provided an opportunity to ask questions and all were answered. The patient agreed with the plan and demonstrated an understanding of the instructions.   The patient was advised to call back or seek an in-person evaluation if the symptoms worsen  or if the condition fails to improve as anticipated.   I spent 10 minutes on this telephone encounter.   Thank you for allowing me to participate in the care of this patient.   Kenney Peacemaker, DNP, AGNP-[facility]

## 2023-09-15 NOTE — Telephone Encounter (Signed)
 The patient called because she did not get pain med and antibiotic on her papers today. The meds are at her pharmacy and she understands.
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
## 2023-09-15 NOTE — Progress Notes (Signed)
 Tumor Board Documentation  Patricia Good was presented by Simpson Dubs, RN at our Tumor Board on 09/15/2023, which included representatives from medical oncology, radiation oncology, surgical oncology, pathology, navigation, palliative care.  Patricia Good currently presents as a new patient with history of the following treatments: none.  Additionally, we reviewed previous medical and familial history, history of present illness, and recent lab results along with all available histopathologic and imaging studies. The tumor board considered available treatment options and made the following recommendations: Concurrent chemo-radiation therapy Recommend adding PD-L1 to biopsy  The following procedures/referrals were also placed: No orders of the defined types were placed in this encounter.   Clinical Trial Status: not discussed   Staging used:    National site-specific guidelines NCCN (at least stage III based on imaging studies) were discussed with respect to the case. - at least stage III based on imaging studies   Tumor board is a meeting of clinicians from various specialty areas who evaluate and discuss patients for whom a multidisciplinary approach is being considered. Final determinations in the plan of care are those of the provider(s). The responsibility for follow up of recommendations given during tumor board is that of the provider.   Today's extended care, comprehensive team conference, Patricia Good was not present for the discussion and was not examined.

## 2023-09-15 NOTE — Progress Notes (Signed)
 Patient reports: Constipation Tired Weak Feeling irritable Vag discharge Vulvar pain 8/10

## 2023-09-16 ENCOUNTER — Telehealth: Payer: Self-pay

## 2023-09-16 LAB — CALCIUM, IONIZED: Calcium, Ionized, Serum: 4.7 mg/dL (ref 4.5–5.6)

## 2023-09-16 NOTE — Telephone Encounter (Addendum)
 PD-L1 requested with [facility] on vulvar specimen, collected 08/03/2023. Specimen is located at [facility]. Software engineer completed with Ms. Manges. Reviewed upcoming appointments on 6/18 with radiation/medical oncology and palliative care. Read back for arrival time completed. She reports oxycodone  is helping her pain and she has started her antibiotic.

## 2023-09-20 ENCOUNTER — Telehealth: Payer: Self-pay | Admitting: *Deleted

## 2023-09-20 NOTE — Telephone Encounter (Signed)
 Please let patient know I am not in town starting 7/9 and would like to see her prior to going on vacation

## 2023-09-20 NOTE — Telephone Encounter (Signed)
 Says that she wants to cancel her appointment for now and her mom is also in comfort care and so she wants to cancel the appointment that is coming up and put it out till 4 weeks.  And she would like when the next appointments which is 4 weeks from now would like someone to call her because she does not have another way of getting what the date and time is for her

## 2023-09-21 ENCOUNTER — Telehealth: Payer: Self-pay

## 2023-09-21 NOTE — Telephone Encounter (Signed)
 Called and spoke to Ms. Hindes regarding her request to delay her consults for 4 weeks. She states she has had diarrhea, some type of flu bug. She also reports that her mother was moved from inpatient hospice to the hospice home and this takes precedence over her own health. She was educated that delaying her own health could result in her cancer continuing to spread and her care would become not curable at some point. She was adamant that she could not handle her appointments with her mother declining. She was agreeable to coming sooner than 4 weeks but would only do 3. We will reschedule her appointments at her request. Palliative care will not be available during that time. She states her pain is controlled with the oxycodone  that was prescribed. She was given her new appointment details for 7/7 starting at 1430 in radiation followed by Dr. Randy Buttery at 1500. She will still receive a call 6/19 by the NP to follow up on her abscess.

## 2023-09-22 ENCOUNTER — Ambulatory Visit: Admitting: Radiation Oncology

## 2023-09-22 ENCOUNTER — Inpatient Hospital Stay

## 2023-09-22 ENCOUNTER — Inpatient Hospital Stay: Admitting: Oncology

## 2023-09-22 ENCOUNTER — Inpatient Hospital Stay: Admitting: Hospice and Palliative Medicine

## 2023-09-23 ENCOUNTER — Inpatient Hospital Stay: Admitting: Nurse Practitioner

## 2023-09-23 ENCOUNTER — Telehealth: Admitting: Nurse Practitioner

## 2023-09-23 DIAGNOSIS — L02429 Furuncle of limb, unspecified: Secondary | ICD-10-CM | POA: Diagnosis not present

## 2023-09-23 NOTE — Progress Notes (Signed)
 Virtual Visit Progress Note  Symptom Management Clinic  [facility] A Department of the [place]. [facility] [address] [place], [place] [address] [phone] (phone) [phone] (fax)  I connected with Patricia Good on 09/23/23 at  3:00 PM EDT by telephone visit and verified that I am speaking with the correct person using two identifiers.   I discussed the limitations, risks, security and privacy concerns of performing an evaluation and management service by telemedicine and the availability of in-person appointments. I also discussed with the patient that there may be a patient responsible charge related to this service. The patient expressed understanding and agreed to proceed.   Other persons participating in the visit and their role in the encounter: none   Patient's location: home  Provider's location: clinic     Patient Care Team: Entzminger, Iantha Mainland, MD as PCP - General (Internal Medicine) Waverly Hageman, RN as Oncology Nurse Navigator Rochell Chroman, RN as Oncology Nurse Navigator Avonne Boettcher, MD as Consulting Physician (Oncology) Glenis Langdon, MD as Consulting Physician (Radiation Oncology)   Name of the patient: Patricia Good  [id number]  07/06/1960   Date of visit: 09/23/23  Diagnosis- Vulvar Cancer  Chief complaint/ Reason for visit- Skin Infection  Heme/Onc history:  Oncology History  Malignant neoplasm of upper-inner quadrant of left breast in female, estrogen receptor positive (HCC)  03/10/2022 Initial Diagnosis   Malignant neoplasm of upper-inner quadrant of left breast in female, estrogen receptor positive (HCC)   03/10/2022 Cancer Staging   Staging form: Breast, AJCC 8th Edition - Clinical stage from 03/10/2022: Stage IA (cT1a, cN0, cM0, G2, ER+, PR+, HER2-) - Signed by Avonne Boettcher, MD on 03/10/2022 Histologic grading system: 3 grade system   04/07/2022 Cancer Staging   Staging  form: Breast, AJCC 8th Edition - Pathologic stage from 04/07/2022: Stage IA (pT1a, pN0, cM0, G3, ER+, PR+, HER2-, Oncotype DX score: 28) - Signed by Avonne Boettcher, MD on 04/13/2022 Stage prefix: Initial diagnosis Multigene prognostic tests performed: Oncotype DX Recurrence score range: Greater than or equal to 11 Histologic grading system: 3 grade system     Interval history- Patient agrees to telephone visit to follow up on skin infection. Her mother has been transitioned to hospice house. She reports poor compliance with antibiotics but symptoms are improving.   Review of systems- Review of Systems  Constitutional:  Negative for fever.    Allergies  Allergen Reactions   Shellfish Allergy Anaphylaxis    Only to crab meat (non-imitation), not shrimp   Sulfa Antibiotics Hives, Anaphylaxis and Other (See Comments)   Sulfasalazine Anaphylaxis and Hives    Other reaction(s): Other (See Comments)   Lisinopril     unknown    Current Outpatient Medications:    alendronate (FOSAMAX) 70 MG tablet, Take 70 mg by mouth once a week., Disp: , Rfl:    atorvastatin (LIPITOR) 20 MG tablet, Take 20 mg by mouth at bedtime., Disp: , Rfl:    buPROPion  (WELLBUTRIN  XL) 150 MG 24 hr tablet, TAKE 1 TABLET BY MOUTH DAILY ALONG WITH 300 MG TABLET FOR TOTAL OF 450 MG DAILY, Disp: 90 tablet, Rfl: 1   buPROPion  (WELLBUTRIN  XL) 300 MG 24 hr tablet, Take 1 tablet (300 mg total) by mouth daily. Take along with 150 mg daily , total of 450 mg daily, Disp: 90 tablet, Rfl: 1   Calcium  Carb-Cholecalciferol 500-10 MG-MCG TABS, Take 1 tablet by mouth.,  Disp: , Rfl:    calcium -vitamin D  (OSCAL WITH D) 500-200 MG-UNIT tablet, Take 1 tablet by mouth daily with breakfast., Disp: , Rfl:    fluticasone  (FLONASE ) 50 MCG/ACT nasal spray, Place into both nostrils as needed for allergies or rhinitis., Disp: , Rfl:    furosemide  (LASIX ) 20 MG tablet, Take 20 mg by mouth daily. , Disp: , Rfl:    hydrOXYzine  (ATARAX ) 10 MG tablet, Take  2 tablets (20 mg total) by mouth at bedtime as needed. for sleep and anxiety, Disp: 180 tablet, Rfl: 0   letrozole  (FEMARA ) 2.5 MG tablet, Take 1 tablet (2.5 mg total) by mouth daily., Disp: 30 tablet, Rfl: 3   levothyroxine  (SYNTHROID , LEVOTHROID) 112 MCG tablet, , Disp: , Rfl:    OLANZapine  (ZYPREXA ) 5 MG tablet, Take 1 tablet (5 mg total) by mouth at bedtime., Disp: 90 tablet, Rfl: 3   omeprazole (PRILOSEC) 40 MG capsule, Take 40 mg by mouth daily. , Disp: , Rfl:    oxyCODONE  (OXY IR/ROXICODONE ) 5 MG immediate release tablet, Take 1 tablet (5 mg total) by mouth every 8 (eight) hours as needed for severe pain (pain score 7-10) (for pain unrelieved by tylenol )., Disp: 30 tablet, Rfl: 0   prazosin  (MINIPRESS ) 2 MG capsule, TAKE ONE CAPSULE BY MOUTH AT BEDTIME., Disp: 90 capsule, Rfl: 1  Physical exam: Exam limited due to telemedicine  There were no vitals filed for this visit. Physical Exam Pulmonary:     Effort: No respiratory distress.   Neurological:     Mental Status: She is oriented to person, place, and time.   Psychiatric:        Mood and Affect: Mood normal.    Assessment and plan- Patient is a 63 y.o. female diagnosed with vulvar cancer, not yet started treatment who follows up by telephone for:    1) Skin Infection- prescribed cephalexin  500 mg q6h however, poor compliance. Symptoms are improving however. Encouraged her to continue antibiotics, take as prescribed. Complete entire course of antibiotics.   Visit Diagnosis 1. Furuncle of thigh    Patient expressed understanding and was in agreement with this plan. She also understands that She can call clinic at any time with any questions, concerns, or complaints.   I discussed the assessment and treatment plan with the patient. The patient was provided an opportunity to ask questions and all were answered. The patient agreed with the plan and demonstrated an understanding of the instructions.   The patient was advised to call  back or seek an in-person evaluation if the symptoms worsen or if the condition fails to improve as anticipated.  I spent 10 minutes on this telephone encounter.   Thank you for allowing me to participate in the care of this very pleasant patient.   Kenney Peacemaker, DNP, AGNP-C [facility] at [facility]

## 2023-09-28 ENCOUNTER — Encounter: Payer: Self-pay | Admitting: Psychiatry

## 2023-09-28 ENCOUNTER — Other Ambulatory Visit: Payer: Self-pay

## 2023-09-28 ENCOUNTER — Ambulatory Visit (INDEPENDENT_AMBULATORY_CARE_PROVIDER_SITE_OTHER): Admitting: Psychiatry

## 2023-09-28 VITALS — BP 118/75 | HR 84 | Temp 98.6°F | Ht 61.0 in | Wt 185.6 lb

## 2023-09-28 DIAGNOSIS — F250 Schizoaffective disorder, bipolar type: Secondary | ICD-10-CM | POA: Diagnosis not present

## 2023-09-28 DIAGNOSIS — F4310 Post-traumatic stress disorder, unspecified: Secondary | ICD-10-CM

## 2023-09-28 DIAGNOSIS — Z634 Disappearance and death of family member: Secondary | ICD-10-CM | POA: Insufficient documentation

## 2023-09-28 DIAGNOSIS — F4010 Social phobia, unspecified: Secondary | ICD-10-CM | POA: Diagnosis not present

## 2023-09-28 DIAGNOSIS — F17200 Nicotine dependence, unspecified, uncomplicated: Secondary | ICD-10-CM

## 2023-09-28 MED ORDER — BUPROPION HCL ER (XL) 300 MG PO TB24: 300.0000 mg | ORAL_TABLET | Freq: Every day | ORAL | 1 refills | Status: DC

## 2023-09-28 NOTE — Progress Notes (Signed)
 BH MD OP Progress Note  09/28/2023 2:20 PM Patricia Good  MRN:  [id number]  Chief Complaint:  Chief Complaint  Patient presents with   Follow-up   Depression   Anxiety   Medication Refill   Discussed the use of AI scribe software for clinical note transcription with the patient, who gave verbal consent to proceed.  History of Present Illness Patricia Good is a 63 year old Caucasian female, single, lives in [place], has a history of schizoaffective disorder, PTSD, social anxiety disorder, COPD, vitamin B12 deficiency, psoriasis, history of malignant melanoma, hypothyroidism, osteoarthritis, stage I increase it carcinoma of the left breast ER/PR-positive,HER 2 negative status post left sided partial mastectomy currently on letrozole , Vulvar cancer was evaluated in office today for a follow-up.  She is experiencing grief following the recent death of her mother, who passed away last November 07, 2023 after multiple strokes. Her mother was in hospice care with a DNR order. She feels guilt and sadness, particularly regarding her sister's perception of her involvement in her mother's care.  She has been diagnosed with cancer affecting the vulva with lymph node involvement. Treatment has not started due to her mother's passing, but a consultation is scheduled for July 7th to discuss radiation and chemotherapy. She is concerned about radiation pain and is considering surgery as an alternative. She uses opioids for pain management but is cautious about regular use to avoid addiction.  She is currently taking Cefalexin 500 mg every six hours for an infection that was abscessed and infected. She also uses Neosporin and Gold Bond powder for additional relief.   Her psychiatric medications include Wellbutrin  (bupropion ) 150 mg and 300 mg, hydroxyzine  as needed, olanzapine , and Minipress  for nightmares. Her medications are effective, with no hallucinations or paranoia, and she generally sleeps well.  She lives in  a rooming house but is seeking alternative housing due to its unsuitability.  She experiences occasional PTSD symptoms, including flashbacks triggered by certain men, related to past trauma. She is interested in therapy but has not yet arranged it. She smokes and is working on improving her diet by eating more vegetables.  She denies any suicidality, homicidality or perceptual disturbances.  Visit Diagnosis:    ICD-10-CM   1. Schizoaffective disorder, bipolar type (HCC)  F25.0 buPROPion  (WELLBUTRIN  XL) 300 MG 24 hr tablet    2. PTSD (post-traumatic stress disorder)  F43.10 buPROPion  (WELLBUTRIN  XL) 300 MG 24 hr tablet    3. Social anxiety disorder  F40.10     4. Bereavement  Z63.4     5. Tobacco use disorder  F17.200       Past Psychiatric History: I have reviewed past psychiatric history from progress note on 07/16/2017.  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   Personal history of radiation therapy    Pre-diabetes    PTSD (post-traumatic stress disorder)    Schizoaffective disorder [facility])     Past Surgical History:  Procedure Laterality Date   BREAST BIOPSY Left 03/03/2022   Left breast stereo bx  [place]ib[place] Clip- path pending   BREAST BIOPSY Left 03/03/2022   MM LT BREAST BX W LOC DEV 1ST LESION IMAGE BX SPEC STEREO GUIDE 03/03/2022 [facility]   BREAST LUMPECTOMY Left 03/20/2022   ECTOPIC PREGNANCY SURGERY     PART MASTECTOMY,RADIO FREQUENCY LOCALIZER,AXILLARY SENTINEL NODE BIOPSY Left 03/20/2022   Procedure: PART MASTECTOMY,RADIO FREQUENCY LOCALIZER,AXILLARY  SENTINEL NODE BIOPSY;  Surgeon: Rodolph Romano, MD;  Location: [facility] ORS;  Service: General;  Laterality: Left;   TOTAL ABDOMINAL HYSTERECTOMY W/ BILATERAL SALPINGOOPHORECTOMY  1990    Family Psychiatric History: I have reviewed family psychiatric history from progress note on 07/16/2017.  Family History:  Family History  Problem Relation Age of Onset   Hypertension Father    Diabetes Father    Heart disease Father    Blindness Father    Alcohol abuse Sister    Drug abuse Sister    Anxiety disorder Sister    Depression Sister    Obesity Brother    Arthritis Brother    Alcohol abuse Brother    Drug abuse Brother    Depression Brother    Breast cancer Cousin    Clotting disorder Neg Hx     Social History: Reviewed social history from my progress note on 07/16/2017. Social History   Socioeconomic History   Marital status: Single    Spouse name: Not on file   Number of children: 0   Years of education: Not on file   Highest education level: Associate degree: occupational, Scientist, product/process development, or vocational program  Occupational History   Not on file  Tobacco Use   Smoking status: Every Day    Current packs/day: 0.50    Average packs/day: 0.5 packs/day for 54.7 years (27.3 ttl pk-yrs)    Types: Cigarettes    Start date: 01/22/1969   Smokeless tobacco: Never   Tobacco comments:    Continues to go down on number per day to 5-7 per day.   Vaping Use   Vaping status: Never Used  Substance and Sexual Activity   Alcohol use: Not Currently    Alcohol/week: 1.0 - 2.0 standard drink of alcohol    Types: 1 Glasses of wine per week    Comment: occasional   Drug use: No   Sexual activity: Not Currently    Birth control/protection: None  Other Topics Concern   Not on file  Social History Narrative   Lives alone   Social Drivers of Health   Financial Resource Strain: Medium Risk (08/03/2023)   Received from [facility]   Overall Financial Resource Strain  (CARDIA)    Difficulty of Paying Living Expenses: Somewhat hard  Food Insecurity: No Food Insecurity (08/19/2023)   Hunger Vital Sign    Worried About Running Out of Food in the Last Year: Never true    Ran Out of Food in the Last Year: Never true  Transportation Needs: No Transportation Needs (08/19/2023)   PRAPARE - Administrator, Civil Service (Medical): No    Lack of Transportation (Non-Medical): No  Physical Activity: Inactive (06/15/2017)   Exercise Vital Sign    Days of Exercise per Week: 0 days    Minutes of Exercise per Session: 0 min  Stress: Stress Concern Present (06/15/2017)   [employer] of Occupational Health - Occupational Stress Questionnaire    Feeling of Stress : Very much  Social Connections: Socially Isolated (06/15/2017)   Social Connection and Isolation Panel    Frequency of Communication with Friends and Family: Never  Frequency of Social Gatherings with Friends and Family: Never    Attends Religious Services: Never    Database administrator or Organizations: No    Attends Banker Meetings: Never    Marital Status: Divorced    Allergies:  Allergies  Allergen Reactions   Shellfish Allergy Anaphylaxis    Only to crab meat (non-imitation), not shrimp   Sulfa Antibiotics Hives, Anaphylaxis and Other (See Comments)   Sulfasalazine Anaphylaxis and Hives    Other reaction(s): Other (See Comments)   Lisinopril     unknown    Metabolic Disorder Labs: Lab Results  Component Value Date   HGBA1C 5.7 (H) 05/06/2020   MPG 116.89 05/06/2020   MPG 117 12/21/2017   Lab Results  Component Value Date   PROLACTIN 1.3 (L) 05/06/2020   PROLACTIN 1.2 (L) 07/12/2017   Lab Results  Component Value Date   CHOL 193 05/06/2020   TRIG 103 05/06/2020   HDL 56 05/06/2020   CHOLHDL 3.4 05/06/2020   VLDL 21 05/06/2020   LDLCALC 116 (H) 05/06/2020   LDLCALC 163 (H) 12/21/2017   Lab Results  Component Value Date   TSH 2.250 06/17/2021    TSH 2.143 05/06/2020    Therapeutic Level Labs: No results found for: LITHIUM No results found for: VALPROATE No results found for: CBMZ  Current Medications: Current Outpatient Medications  Medication Sig Dispense Refill   cephALEXin  (KEFLEX ) 500 MG capsule Take 500 mg by mouth 4 (four) times daily.     alendronate (FOSAMAX) 70 MG tablet Take 70 mg by mouth once a week.     atorvastatin (LIPITOR) 20 MG tablet Take 20 mg by mouth at bedtime.     buPROPion  (WELLBUTRIN  XL) 150 MG 24 hr tablet TAKE 1 TABLET BY MOUTH DAILY ALONG WITH 300 MG TABLET FOR TOTAL OF 450 MG DAILY 90 tablet 1   buPROPion  (WELLBUTRIN  XL) 300 MG 24 hr tablet Take 1 tablet (300 mg total) by mouth daily. Take along with 150 mg daily , total of 450 mg daily 90 tablet 1   Calcium  Carb-Cholecalciferol 500-10 MG-MCG TABS Take 1 tablet by mouth.     calcium -vitamin D  (OSCAL WITH D) 500-200 MG-UNIT tablet Take 1 tablet by mouth daily with breakfast.     fluticasone  (FLONASE ) 50 MCG/ACT nasal spray Place into both nostrils as needed for allergies or rhinitis.     furosemide  (LASIX ) 20 MG tablet Take 20 mg by mouth daily.      hydrOXYzine  (ATARAX ) 10 MG tablet Take 2 tablets (20 mg total) by mouth at bedtime as needed. for sleep and anxiety 180 tablet 0   letrozole  (FEMARA ) 2.5 MG tablet Take 1 tablet (2.5 mg total) by mouth daily. 30 tablet 3   levothyroxine  (SYNTHROID , LEVOTHROID) 112 MCG tablet      OLANZapine  (ZYPREXA ) 5 MG tablet Take 1 tablet (5 mg total) by mouth at bedtime. 90 tablet 3   omeprazole (PRILOSEC) 40 MG capsule Take 40 mg by mouth daily.      oxyCODONE  (OXY IR/ROXICODONE ) 5 MG immediate release tablet Take 1 tablet (5 mg total) by mouth every 8 (eight) hours as needed for severe pain (pain score 7-10) (for pain unrelieved by tylenol ). 30 tablet 0   prazosin  (MINIPRESS ) 2 MG capsule TAKE ONE CAPSULE BY MOUTH AT BEDTIME. 90 capsule 1   No current facility-administered medications for this visit.      Musculoskeletal: Strength & Muscle Tone: within normal limits Gait & Station: normal Patient  leans: N/A  Psychiatric Specialty Exam: Review of Systems  Psychiatric/Behavioral:  The patient is nervous/anxious.        Grieving    Blood pressure 118/75, pulse 84, temperature 98.6 F (37 C), temperature source Temporal, height 5' 1 (1.549 m), weight 185 lb 9.6 oz (84.2 kg).Body mass index is 35.07 kg/m.  General Appearance: Casual  Eye Contact:  Fair  Speech:  Clear and Coherent  Volume:  Normal  Mood:  Anxious and Grieving  Affect:  Tearful  Thought Process:  Goal Directed and Descriptions of Associations: Intact  Orientation:  Full (Time, Place, and Person)  Thought Content: Logical   Suicidal Thoughts:  No  Homicidal Thoughts:  No  Memory:  Immediate;   Fair Recent;   Fair Remote;   Fair  Judgement:  Fair  Insight:  Fair  Psychomotor Activity:  Normal  Concentration:  Concentration: Fair and Attention Span: Fair  Recall:  [employer] of Knowledge: Fair  Language: Fair  Akathisia:  No  Handed:  Right  AIMS (if indicated): done  Assets:  Communication Skills Desire for Improvement Housing Social Support  ADL's:  Intact  Cognition: WNL  Sleep:  Fair   Screenings: Midwife Visit from 09/28/2023 in [facility] Office Visit from 09/09/2023 in [facility] Office Visit from 06/01/2023 in [facility] Office Visit from 04/15/2023 in [facility] Office Visit from 01/13/2023 in [facility]  AIMS Total Score 0 0 0 0 0   AUDIT    Flowsheet Row Admission (Discharged) from 12/22/2017 in [facility]  Alcohol Use Disorder Identification Test Final Score (AUDIT) 1   GAD-7    Flowsheet Row Office Visit from 06/01/2023 in [facility] Office Visit from 04/15/2023 in [facility] Office Visit from 01/13/2023 in [facility] Office Visit from 08/26/2022 in [facility] Office Visit from 06/18/2022 in [facility]  Total GAD-7 Score 4 2 2 1 3    PHQ2-9    Flowsheet Row Office Visit from 08/18/2023 in [facility] [place] Office Visit from 06/01/2023 in [facility] Office Visit from 04/15/2023 in [facility] Office Visit from 01/13/2023 in [facility] Office Visit from 08/26/2022 in [facility]  PHQ-2 Total Score 0 1 4 0 0  PHQ-9 Total Score -- 4 12 -- 1   Flowsheet Row Office Visit from 09/28/2023 in [facility] Office Visit from 09/09/2023 in [facility] Office Visit from 06/01/2023 in [facility]  C-SSRS RISK CATEGORY No Risk Moderate Risk No Risk     Assessment and Plan: Patricia Good is a 63 year old Caucasian female who was evaluated in office today for a follow-up appointment.  Discussed assessment and plan as noted below.  Schizoaffective disorder-stable Currently denies any significant mood swings or hallucinations. Continue Olanzapine  5 mg daily Continue Wellbutrin  450 mg daily  Posttraumatic stress disorder-stable Does have certain triggers which causes intrusive memories although she is better than before. Continue Prazosin  2 mg at bedtime Will benefit from psychotherapy sessions.  Social anxiety  disorder-unstable Continues to have his social anxiety and limits her social contacts. Will benefit from  psychotherapy sessions, previously provided resources. Continue Hydroxyzine  10 to 20 mg at bedtime as needed.  Bereavement-unstable Currently grieving the loss of her mother who passed away a few days ago. Provided brief grief counseling.  Tobacco use disorder-unstable Continues to smoke half a pack of cigarettes per day.  Not interested in quitting.  Follow-up Follow-up in clinic in 4 weeks or sooner if needed.  Collaboration of Care: Collaboration of Care: Referral or follow-up with counselor/therapist AEB and courage to establish care with therapist.  Patient/Guardian was advised Release of Information must be obtained prior to any record release in order to collaborate their care with an outside provider. Patient/Guardian was advised if they have not already done so to contact the registration department to sign all necessary forms in order for us  to release information regarding their care.   Consent: Patient/Guardian gives verbal consent for treatment and assignment of benefits for services provided during this visit. Patient/Guardian expressed understanding and agreed to proceed.  This note was generated in part or whole with voice recognition software. Voice recognition is usually quite accurate but there are transcription errors that can and very often do occur. I apologize for any typographical errors that were not detected and corrected.     Storey Stangeland, MD 09/28/2023, 2:20 PM

## 2023-10-04 ENCOUNTER — Telehealth: Payer: Self-pay

## 2023-10-04 ENCOUNTER — Encounter: Payer: Self-pay | Admitting: Obstetrics and Gynecology

## 2023-10-04 NOTE — Telephone Encounter (Signed)
 Received voicemail from Patricia Good, notifying me that she has cancelled her appointments with med onc and rad onc stating she is refusing any chemo or radiation for her vulvar cancer and if this is her only option she will go untreated. Returned her call and made her aware that her appointment with Dr. Melanee was also her breast cancer follow up and she does want to reschedule this. Offered her another appointment with gyn oncology regarding surgery. She will attempt to get transportation for this Wednesday 7/2. She also needs to see palliative care and we will attempt to get this rescheduled as well.

## 2023-10-06 ENCOUNTER — Inpatient Hospital Stay: Attending: Obstetrics and Gynecology | Admitting: Obstetrics and Gynecology

## 2023-10-06 ENCOUNTER — Ambulatory Visit
Admission: RE | Admit: 2023-10-06 | Discharge: 2023-10-06 | Disposition: A | Discharge status: Home / Self Care | Source: Ambulatory Visit | Attending: Radiation Oncology | Admitting: Radiation Oncology

## 2023-10-06 ENCOUNTER — Encounter: Payer: Self-pay | Admitting: Obstetrics and Gynecology

## 2023-10-06 ENCOUNTER — Inpatient Hospital Stay (HOSPITAL_BASED_OUTPATIENT_CLINIC_OR_DEPARTMENT_OTHER): Admitting: Nurse Practitioner

## 2023-10-06 VITALS — BP 132/67 | HR 87 | Temp 97.8°F | Resp 19 | Wt 186.3 lb

## 2023-10-06 DIAGNOSIS — C50919 Malignant neoplasm of unspecified site of unspecified female breast: Secondary | ICD-10-CM | POA: Diagnosis not present

## 2023-10-06 DIAGNOSIS — C519 Malignant neoplasm of vulva, unspecified: Secondary | ICD-10-CM | POA: Diagnosis not present

## 2023-10-06 DIAGNOSIS — Z515 Encounter for palliative care: Secondary | ICD-10-CM | POA: Insufficient documentation

## 2023-10-06 DIAGNOSIS — I739 Peripheral vascular disease, unspecified: Secondary | ICD-10-CM | POA: Insufficient documentation

## 2023-10-06 DIAGNOSIS — R7989 Other specified abnormal findings of blood chemistry: Secondary | ICD-10-CM | POA: Insufficient documentation

## 2023-10-06 DIAGNOSIS — N1831 Chronic kidney disease, stage 3a: Secondary | ICD-10-CM | POA: Insufficient documentation

## 2023-10-06 DIAGNOSIS — G893 Neoplasm related pain (acute) (chronic): Secondary | ICD-10-CM | POA: Diagnosis not present

## 2023-10-06 DIAGNOSIS — Z79811 Long term (current) use of aromatase inhibitors: Secondary | ICD-10-CM | POA: Insufficient documentation

## 2023-10-06 DIAGNOSIS — C774 Secondary and unspecified malignant neoplasm of inguinal and lower limb lymph nodes: Secondary | ICD-10-CM | POA: Insufficient documentation

## 2023-10-06 DIAGNOSIS — E878 Other disorders of electrolyte and fluid balance, not elsewhere classified: Secondary | ICD-10-CM | POA: Insufficient documentation

## 2023-10-06 DIAGNOSIS — R102 Pelvic and perineal pain: Secondary | ICD-10-CM

## 2023-10-06 DIAGNOSIS — E871 Hypo-osmolality and hyponatremia: Secondary | ICD-10-CM | POA: Diagnosis not present

## 2023-10-06 DIAGNOSIS — F17210 Nicotine dependence, cigarettes, uncomplicated: Secondary | ICD-10-CM | POA: Insufficient documentation

## 2023-10-06 MED ORDER — OXYCODONE HCL 5 MG PO TABS: 2.5000 mg | ORAL_TABLET | Freq: Three times a day (TID) | ORAL | 0 refills | Status: AC | PRN

## 2023-10-06 NOTE — Progress Notes (Signed)
 Palliative Medicine [facility] at [facility] Telephone:([phone]) [phone] Fax:([phone]) [phone]   Name: Patricia Good Date: 10/06/2023 MRN: [id number]  DOB: 07-07-1960  Patient Care Team: Sampson Ethridge LABOR, MD as PCP - General (Internal Medicine) [name] Shasta POUR, RN as Oncology Nurse Navigator Maurie Rayfield BIRCH, RN as Oncology Nurse Navigator Melanee Annah JAYSON, MD as Consulting Physician (Oncology) Lenn Aran, MD as Consulting Physician (Radiation Oncology)   REASON FOR CONSULTATION: Patricia Good is a 63 y.o. female with multiple medical problems including schizoaffective disorder, PTSD, social anxiety disorder, COPD, breast cancer, melanoma, hypothyroidism, OA, bipolar, newly diagnosed with vulvar cancer who presents to palliative care clinic for symptom management. .   SOCIAL HISTORY:     reports that she has been smoking cigarettes. She started smoking about 54 years ago. She has a 27.4 pack-year smoking history. She has never used smokeless tobacco. She reports that she does not currently use alcohol after a past usage of about 1.0 - 2.0 standard drink of alcohol per week. She reports that she does not use drugs.  ADVANCE DIRECTIVES:  None on file  CODE STATUS: Full code  PAST MEDICAL HISTORY: Past Medical History:  Diagnosis Date   Abnormal thyroid  stimulating hormone (TSH) level    Anemia    Anxiety    Asthma    Bipolar 1 disorder (HCC)    COPD (chronic obstructive pulmonary disease) (HCC)    Endometriosis    GERD (gastroesophageal reflux disease)    Hepatitis A    Hypertension    Hypothyroidism    Malignant neoplasm of upper-inner quadrant of left breast in female, 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   Personal history of radiation therapy    Pre-diabetes    PTSD (post-traumatic stress disorder)    Schizoaffective disorder (HCC)     PAST SURGICAL HISTORY:  Past Surgical History:  Procedure Laterality Date   BREAST BIOPSY Left  03/03/2022   Left breast stereo bx Ribbon Clip- path pending   BREAST BIOPSY Left 03/03/2022   MM LT BREAST BX W LOC DEV 1ST LESION IMAGE BX SPEC STEREO GUIDE 03/03/2022 [facility]   BREAST LUMPECTOMY Left 03/20/2022   ECTOPIC PREGNANCY SURGERY     PART MASTECTOMY,RADIO FREQUENCY LOCALIZER,AXILLARY SENTINEL NODE BIOPSY Left 03/20/2022   Procedure: PART MASTECTOMY,RADIO FREQUENCY LOCALIZER,AXILLARY SENTINEL NODE BIOPSY;  Surgeon: Rodolph Romano, MD;  Location: [facility] ORS;  Service: General;  Laterality: Left;   TOTAL ABDOMINAL HYSTERECTOMY W/ BILATERAL SALPINGOOPHORECTOMY  1990    HEMATOLOGY/ONCOLOGY HISTORY:  Oncology History  Malignant neoplasm of upper-inner quadrant of left breast in female, estrogen receptor positive (HCC)  03/10/2022 Initial Diagnosis   Malignant neoplasm of upper-inner quadrant of left breast in female, estrogen receptor positive (HCC)   03/10/2022 Cancer Staging   Staging form: Breast, AJCC 8th Edition - Clinical stage from 03/10/2022: Stage IA (cT1a, cN0, cM0, G2, ER+, PR+, HER2-) - Signed by Melanee Annah JAYSON, MD on 03/10/2022 Histologic grading system: 3 grade system   04/07/2022 Cancer Staging   Staging form: Breast, AJCC 8th Edition - Pathologic stage from 04/07/2022: Stage IA (pT1a, pN0, cM0, G3, ER+, PR+, HER2-, Oncotype DX score: 28) - Signed by Melanee Annah JAYSON, MD on 04/13/2022 Stage prefix: Initial diagnosis Multigene prognostic tests performed: Oncotype DX Recurrence score range: Greater than or equal to 11 Histologic grading system: 3 grade system     ALLERGIES:  is allergic to shellfish allergy, sulfa antibiotics, sulfasalazine, and lisinopril.  MEDICATIONS:  Current Outpatient Medications  Medication Sig Dispense Refill   alendronate (FOSAMAX) 70 MG tablet Take 70 mg by mouth once a week.     atorvastatin (LIPITOR) 20 MG tablet Take 20 mg by mouth at bedtime.     buPROPion  (WELLBUTRIN  XL) 150 MG 24 hr tablet TAKE 1 TABLET BY MOUTH DAILY ALONG  WITH 300 MG TABLET FOR TOTAL OF 450 MG DAILY 90 tablet 1   buPROPion  (WELLBUTRIN  XL) 300 MG 24 hr tablet Take 1 tablet (300 mg total) by mouth daily. Take along with 150 mg daily , total of 450 mg daily 90 tablet 1   Calcium  Carb-Cholecalciferol 500-10 MG-MCG TABS Take 1 tablet by mouth.     calcium -vitamin D  (OSCAL WITH D) 500-200 MG-UNIT tablet Take 1 tablet by mouth daily with breakfast.     cephALEXin  (KEFLEX ) 500 MG capsule Take 500 mg by mouth 4 (four) times daily.     fluticasone  (FLONASE ) 50 MCG/ACT nasal spray Place into both nostrils as needed for allergies or rhinitis.     furosemide  (LASIX ) 20 MG tablet Take 20 mg by mouth daily.      hydrOXYzine  (ATARAX ) 10 MG tablet Take 2 tablets (20 mg total) by mouth at bedtime as needed. for sleep and anxiety 180 tablet 0   letrozole  (FEMARA ) 2.5 MG tablet Take 1 tablet (2.5 mg total) by mouth daily. 30 tablet 3   levothyroxine  (SYNTHROID , LEVOTHROID) 112 MCG tablet      OLANZapine  (ZYPREXA ) 5 MG tablet Take 1 tablet (5 mg total) by mouth at bedtime. 90 tablet 3   omeprazole (PRILOSEC) 40 MG capsule Take 40 mg by mouth daily.      oxyCODONE  (OXY IR/ROXICODONE ) 5 MG immediate release tablet Take 0.5-1 tablets (2.5-5 mg total) by mouth every 8 (eight) hours as needed for severe pain (pain score 7-10) (for pain unrelieved by tylenol ). 30 tablet 0   prazosin  (MINIPRESS ) 2 MG capsule TAKE ONE CAPSULE BY MOUTH AT BEDTIME. 90 capsule 1   No current facility-administered medications for this visit.    VITAL SIGNS: There were no vitals taken for this visit. There were no vitals filed for this visit.  Estimated body mass index is 35.2 kg/m as calculated from the following:   Height as of 09/28/23: 5' 1 (1.549 m).   Weight as of an earlier encounter on 10/06/23: 186 lb 4.8 oz (84.5 kg).  LABS: CBC:    Component Value Date/Time   WBC 8.4 09/15/2023 1203   HGB 12.7 09/15/2023 1203   HGB 12.8 07/12/2017 1038   HCT 37.0 09/15/2023 1203   HCT 36.5  07/12/2017 1038   PLT 221 09/15/2023 1203   PLT 258 07/12/2017 1038   MCV 106.3 (H) 09/15/2023 1203   MCV 101 (H) 07/12/2017 1038   MCV 109 (H) 11/29/2013 0455   NEUTROABS 4.1 09/15/2023 1203   NEUTROABS 2.8 07/12/2017 1038   NEUTROABS 16.4 (H) 11/29/2013 0455   LYMPHSABS 2.5 09/15/2023 1203   LYMPHSABS 4.0 (H) 07/12/2017 1038   LYMPHSABS 1.4 11/29/2013 0455   MONOABS 0.8 09/15/2023 1203   MONOABS 0.3 11/29/2013 0455   EOSABS 0.8 (H) 09/15/2023 1203   EOSABS 0.9 (H) 07/12/2017 1038   EOSABS 0.0 11/29/2013 0455   BASOSABS 0.1 09/15/2023 1203   BASOSABS 0.0 07/12/2017 1038   BASOSABS 0.0 11/29/2013 0455   Comprehensive Metabolic Panel:    Component Value Date/Time   NA 133 (L) 09/15/2023 1203   NA 139 07/12/2017 1038   NA 138 11/29/2013 0455   K  4.2 09/15/2023 1203   K 3.9 11/29/2013 0455   CL 97 (L) 09/15/2023 1203   CL 105 11/29/2013 0455   CO2 29 09/15/2023 1203   CO2 23 11/29/2013 0455   BUN 13 09/15/2023 1203   BUN 13 07/12/2017 1038   BUN 18 11/29/2013 0455   CREATININE 1.20 (H) 09/15/2023 1203   CREATININE 1.46 (H) 11/29/2013 0455   GLUCOSE 92 09/15/2023 1203   GLUCOSE 165 (H) 11/29/2013 0455   CALCIUM  8.6 (L) 09/15/2023 1203   CALCIUM  8.6 11/29/2013 0455   AST 57 (H) 09/15/2023 1203   AST 34 11/28/2013 0152   ALT 57 (H) 09/15/2023 1203   ALT 32 11/28/2013 0152   ALKPHOS 227 (H) 09/15/2023 1203   ALKPHOS 131 (H) 11/28/2013 0152   BILITOT 0.6 09/15/2023 1203   BILITOT 0.2 07/12/2017 1038   BILITOT 0.2 11/28/2013 0152   PROT 7.5 09/15/2023 1203   PROT 7.4 07/12/2017 1038   PROT 7.2 11/28/2013 0152   ALBUMIN 3.8 09/15/2023 1203   ALBUMIN 4.3 07/12/2017 1038   ALBUMIN 3.2 (L) 11/28/2013 0152    RADIOGRAPHIC STUDIES: NM PET Image Initial (PI) Skull Base To Thigh Result Date: 09/14/2023 CLINICAL DATA:  Initial treatment strategy for vulvar cancer. EXAM: NUCLEAR MEDICINE PET SKULL BASE TO THIGH TECHNIQUE: 10.1 mCi F-18 FDG was injected intravenously.  Full-ring PET imaging was performed from the skull base to thigh after the radiotracer. CT data was obtained and used for attenuation correction and anatomic localization. Fasting blood glucose: 128 mg/dl COMPARISON:  CT scan [id number] FINDINGS: Mediastinal blood pool activity: SUV max 3.0 Liver activity: SUV max NA NECK: Physiologic muscular activity in the neck. Incidental CT findings: Bilateral common carotid atheromatous vascular calcification. CHEST: Mild asymmetry of left upper breast glandular tissues, maximum SUV 3.0 on the left and 1.2 on the right, potentially incidental although correlation with mammographic history is suggested. Right internal mammary node 0.5 cm in short axis on image 53 series 6 with maximum SUV 2.5, below blood pool. Incidental CT findings: Coronary, aortic arch, and branch vessel atherosclerotic vascular disease. ABDOMEN/PELVIS: Superficial right vulvar lesion noted measuring up to 3.3 cm in length with maximum SUV 13.6, compatible with malignancy. Small amount of accentuated activity along the right upper medial thigh just below the crease with the pubis as on image 155 series 6, with associated soft tissue density measuring about 0.7 by 1.7 cm on image 155 series 6. This has a maximum SUV of 7.7 and is concerning for malignancy. Right inguinal lymph node measuring 0.9 cm in short axis on image 136 series 6 has a maximum SUV of 4.8, moderately above blood pool. A right external iliac node measuring 0.8 cm in short axis on image 132 series 6 as a maximum SUV of 3.7. Incidental CT findings: Atherosclerosis is present, including aortoiliac atherosclerotic disease. Low-density fullness of the adrenal glands. Uterus absent. SKELETON: No significant abnormal hypermetabolic activity in this region. Incidental CT findings: Thoracic and lower lumbar spondylosis. IMPRESSION: 1. Superficial right vulvar lesion measuring up to 3.3 cm in length with maximum SUV 13.6, compatible with malignancy.  2. Small amount of accentuated activity along the right upper medial thigh just below the crease with the pubis, with associated soft tissue density measuring about 0.7 by 1.7 cm, with maximum SUV 7.7, concerning for malignancy. 3. Right inguinal and right external iliac lymph nodes with maximum above blood pool, suspicious for metastatic disease. 4. Mild asymmetry of left upper breast glandular tissues, maximum SUV 3.0 on  the left and 1.2 on the right, probably incidental although correlation with mammographic history is suggested. 5. Right internal mammary node 0.5 cm in short axis with maximum SUV 2.5, below blood pool activity. Likely incidental. 6. Thoracic and lower lumbar spondylosis. 7.  Aortic Atherosclerosis (ICD10-I70.0). Electronically Signed   By: Ryan Salvage M.D.   On: 09/14/2023 15:31    PERFORMANCE STATUS (ECOG) : 1 - Symptomatic but completely ambulatory  Review of Systems  Constitutional:  Negative for activity change, appetite change and fatigue.  Gastrointestinal:  Positive for constipation. Negative for abdominal pain, blood in stool, diarrhea and nausea.  Genitourinary:  Positive for genital sores, pelvic pain and vaginal pain. Negative for difficulty urinating.  Psychiatric/Behavioral:  Negative for self-injury and suicidal ideas.   Unless otherwise noted, a complete review of systems is negative.  General: NAD Cardiovascular: regular rate and rhythm Pulmonary: clear ant fields Abdomen: soft, nontender, + bowel sounds GU: no suprapubic tenderness Extremities: no edema, no joint deformities Skin: no rashes Neurological: Weakness but otherwise nonfocal  IMPRESSION: Vulvar cancer - newly diagnosed. Reluctant to undergo chemotherapy or radiation due to prior history of and preconceptions of side effects. Desires surgery, however, not an option based on extent of her disease. I discussed this with Dr Elby, Dr Lenn, and Dr Melanee today. Again reviewed that  chemotherapy and radiation would present opportunity to cure her cancer. Discussed role of symptom management clinic. Recommended collaborative approach with psychiatry. She is now in agreement with chemo and radiation. Plan to add visit with me next week for follow up. She will also benefit from ongoing symptom management and pain management. She reports mood changes with oxycodone  after taking 1-2 doses. Denies having taken norco in the past but concerned for combination with tylenol . Unlikely oxycodone  caused mood changes and recommend re-trial. Can try lower dose of 2.5 mg/half tablet. Reviewed narcotic risks and need for constipation prophylaxis.   PLAN: - Oxycodone  2.5-5 mg q8h as needed for pain.  - Tylenol - not to exceed 3000 mg in 24 hour period  Patient expressed understanding and was in agreement with this plan. She also understands that She can call the clinic at any time with any questions, concerns, or complaints.   Time Total: 25 minutes  Visit consisted of counseling and education dealing with the complex and emotionally intense issues of symptom management and palliative care in the setting of serious and potentially life-threatening illness.Greater than 50%  of this time was spent counseling and coordinating care related to the above assessment and plan.  Signed by: Tinnie Dawn, DNP, AGNP-C, AOCNP [facility] [phone] (clinic)  CC: Dr Coby
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
## 2023-10-06 NOTE — Progress Notes (Signed)
 Radiation Oncology Follow up Note old patient new area of vulvar cancer  Name: Patricia Good   Date:   10/06/2023 MRN:  [id number] DOB: July 08, 1960    This 63 y.o. female presents to the clinic today for locally advanced vulvar cancer in patient previously treated with radiation therapy for stage Ia invasive mammary carcinoma of her left breast to discontinue treatment.  REFERRING PROVIDER: Sampson Ethridge LABOR,*  HPI: Patient is a 63 year old female with multiple comorbidities including schizoaffective disorder PTSD social anxiety disorder COPD and breast cancer.  She was seen by her gynecologist Dr. Lovetta for her herpetic type lesions of the vulva.  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  Tumor extended down to the anal region.  Unfortunately PET scan showed superficial right vulvar lesion measuring 3.3 cm hypermetabolic.  She also had a small amount of accentuated activity along the right upper medial thigh just below the crease with the pubis with hypermetabolic activity concerning for malignancies.  She also had right inguinal and right external iliac lymph nodes with hypermetabolic activity again suspicious for metastatic disease.  Patient had started accelerated partial breast irradiation only received 2 courses and discontinued on her own based on red area appearing.  Again she is been recommended for radiation therapy and possible concurrent chemotherapy.  She is not a surgical candidate based on the extensive nature of her disease by PET/CT criteria.  She is seen today for consultation.  She says she is having pain in that area and is requesting pain medication for that.  She tends towards constipation is not having any lower urinary tract symptoms at this time.  COMPLICATIONS OF TREATMENT: none  FOLLOW UP COMPLIANCE: keeps appointments   PHYSICAL EXAM:  There were no vitals taken for this visit. Well-developed  well-nourished patient in NAD. HEENT reveals PERLA, EOMI, discs not visualized.  Oral cavity is clear. No oral mucosal lesions are identified. Neck is clear without evidence of cervical or supraclavicular adenopathy. Lungs are clear to A&P. Cardiac examination is essentially unremarkable with regular rate and rhythm without murmur rub or thrill. Abdomen is benign with no organomegaly or masses noted. Motor sensory and DTR levels are equal and symmetric in the upper and lower extremities. Cranial nerves II through XII are grossly intact. Proprioception is intact. No peripheral adenopathy or edema is identified. No motor or sensory levels are noted. Crude visual fields are within normal range.  RADIOLOGY RESULTS: PET CT scan reviewed  PLAN: At this time I have recommended concurrent chemoradiation therapy.  Would treat to her vulvar and involved lymph nodes as well as all hypermetabolic areas to 6480 Gray in 36 fractions using IMRT treatment planning and delivery.  I would choose IMRT to spare critical structures such as the rectum bladder small bowel and does paint involved nodes to a higher dose than her standard nodes will receive.  Standard nodes without evidence of hypermetabolic activity received 45 Gray.  While involved nodes will be treated to 60 Gray in 36 fractions.  Patient will be seeing medical oncology for possible addition of weekly cis-platinum therapy.  Risks and benefits of treat including skin reaction fatigue alteration blood counts increased lower Neri tract symptoms diarrhea all were discussed in detail with the patient.  I have set her up for CT simulation next week.  Again we have had problems with compliance in the past for her treatments and I have explained that this is important to do at  this time and attending daily treatment is absolutely necessary.  I believe the patient comprehends my recommendations well.  I would like to take this opportunity to thank you for allowing me to  participate in the care of your patient.SABRA Marcey Penton, MD

## 2023-10-11 ENCOUNTER — Ambulatory Visit: Admitting: Oncology

## 2023-10-11 ENCOUNTER — Inpatient Hospital Stay (HOSPITAL_BASED_OUTPATIENT_CLINIC_OR_DEPARTMENT_OTHER): Admitting: Nurse Practitioner

## 2023-10-11 ENCOUNTER — Institutional Professional Consult (permissible substitution): Admitting: Radiation Oncology

## 2023-10-11 ENCOUNTER — Inpatient Hospital Stay (HOSPITAL_BASED_OUTPATIENT_CLINIC_OR_DEPARTMENT_OTHER): Admitting: Oncology

## 2023-10-11 ENCOUNTER — Ambulatory Visit
Admission: RE | Admit: 2023-10-11 | Discharge: 2023-10-11 | Disposition: A | Discharge status: Home / Self Care | Source: Ambulatory Visit | Attending: Radiation Oncology | Admitting: Radiation Oncology

## 2023-10-11 ENCOUNTER — Encounter: Payer: Self-pay | Admitting: Oncology

## 2023-10-11 VITALS — BP 140/90 | HR 80 | Temp 97.7°F | Resp 18 | Wt 183.0 lb

## 2023-10-11 DIAGNOSIS — C519 Malignant neoplasm of vulva, unspecified: Secondary | ICD-10-CM

## 2023-10-11 DIAGNOSIS — Z515 Encounter for palliative care: Secondary | ICD-10-CM | POA: Diagnosis not present

## 2023-10-11 MED ORDER — PROCHLORPERAZINE MALEATE 10 MG PO TABS: 10.0000 mg | ORAL_TABLET | Freq: Four times a day (QID) | ORAL | 1 refills | Status: AC | PRN

## 2023-10-11 MED ORDER — DEXAMETHASONE 4 MG PO TABS: ORAL_TABLET | ORAL | 1 refills | Status: AC

## 2023-10-11 MED ORDER — ONDANSETRON HCL 8 MG PO TABS: ORAL_TABLET | ORAL | 1 refills | Status: AC

## 2023-10-11 NOTE — Progress Notes (Signed)
 Hematology/Oncology Consult note [facility]  [phone] [phone]  Patient Care Team: Entzminger, Ethridge LABOR, MD as PCP - General (Internal Medicine) [name] Shasta POUR, RN as Oncology Nurse Navigator Maurie Rayfield BIRCH, RN as Oncology Nurse Navigator [name], MD as Consulting Physician (Oncology) Lenn Aran, MD as Consulting Physician (Radiation Oncology)   Name of the patient: Patricia Good  [id number]  November 03, 1960   Date of visit: 10/11/23  Diagnosis- 1. H/o breast cancer 2. New diagnosis of vulvar cancer  Chief complaint/ Reason for visit- discuss management of vulvar carcinoma  Heme/Onc history: Patient is a 63 year old female with history of stage I left breast cancer ER/PR positive HER2 negative.  She underwent lumpectomy and sentinel lymph node biopsy.  She declined adjuvant chemotherapy despite her recurrence score of 28.  She also stopped radiation therapy prematurely.She is presently on letrozole  for endocrine therapy.  She has baseline osteoporosis for which she is on Fosamax.  She was seen by infectious disease for herpetic like lesions but overall lesions were concerning for malignancy and patient was seen by Dr. Lovetta April 2025.  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   PET CT scan on 09/10/2023 showed: 1. Superficial right vulvar lesion measuring up to 3.3 cm in length with maximum SUV 13.6, compatible with malignancy. 2. Small amount of accentuated activity along the right upper medial thigh just below the crease with the pubis, with associated soft tissue density measuring about 0.7 by 1.7 cm, with maximum SUV 7.7, concerning for malignancy. 3. Right inguinal and right external  iliac lymph nodes with maximum above blood pool, suspicious for metastatic disease.  Interval history-patient has been seen by radiation oncology but does not wish to start any treatment until second week of August.  She has underlying symptoms of anxiety and depression for which she follows up with psychiatry.  ECOG PS- 2 Pain scale- 4   Review of systems- Review of Systems  Constitutional:  Negative for chills, fever, malaise/fatigue and weight loss.  HENT:  Negative for congestion, ear discharge and nosebleeds.   Eyes:  Negative for blurred vision.  Respiratory:  Negative for cough, hemoptysis, sputum production, shortness of breath and wheezing.   Cardiovascular:  Negative for chest pain, palpitations, orthopnea and claudication.  Gastrointestinal:  Negative for abdominal pain, blood in stool, constipation, diarrhea, heartburn, melena, nausea and vomiting.  Genitourinary:  Negative for dysuria, flank pain, frequency, hematuria and urgency.  Musculoskeletal:  Negative for back pain, joint pain and myalgias.  Skin:  Negative for rash.  Neurological:  Negative for dizziness, tingling, focal weakness, seizures, weakness and headaches.  Endo/Heme/Allergies:  Does not bruise/bleed easily.  Psychiatric/Behavioral:  Negative for depression and suicidal ideas. The patient is nervous/anxious. The patient does not have insomnia.       Allergies  Allergen Reactions   Shellfish Allergy Anaphylaxis    Only to crab meat (non-imitation), not shrimp   Sulfa Antibiotics Hives, Anaphylaxis and Other (See Comments)   Sulfasalazine Anaphylaxis and Hives    Other reaction(s): Other (See Comments)   Lisinopril     unknown     Past Medical History:  Diagnosis Date   Abnormal thyroid  stimulating hormone (TSH) level    Anemia    Anxiety    Asthma    Bipolar 1 disorder (HCC)  COPD (chronic obstructive pulmonary disease) (HCC)    Endometriosis    GERD (gastroesophageal reflux disease)     Hepatitis A    Hypertension    Hypothyroidism    Malignant neoplasm of upper-inner quadrant of left breast in female, 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   Personal history of radiation therapy    Pre-diabetes    PTSD (post-traumatic stress disorder)    Schizoaffective disorder [facility])      Past Surgical History:  Procedure Laterality Date   BREAST BIOPSY Left 03/03/2022   Left breast stereo bx Ribbon Clip- path pending   BREAST BIOPSY Left 03/03/2022   MM LT BREAST BX W LOC DEV 1ST LESION IMAGE BX SPEC STEREO GUIDE 03/03/2022 [facility]   BREAST LUMPECTOMY Left 03/20/2022   ECTOPIC PREGNANCY SURGERY     PART MASTECTOMY,RADIO FREQUENCY LOCALIZER,AXILLARY SENTINEL NODE BIOPSY Left 03/20/2022   Procedure: PART MASTECTOMY,RADIO FREQUENCY LOCALIZER,AXILLARY SENTINEL NODE BIOPSY;  Surgeon: Rodolph Romano, MD;  Location: [facility] ORS;  Service: General;  Laterality: Left;   TOTAL ABDOMINAL HYSTERECTOMY W/ BILATERAL SALPINGOOPHORECTOMY  1990    Social History   Socioeconomic History   Marital status: Single    Spouse name: Not on file   Number of children: 0   Years of education: Not on file   Highest education level: Associate degree: occupational, Scientist, product/process development, or vocational program  Occupational History   Not on file  Tobacco Use   Smoking status: Every Day    Current packs/day: 0.50    Average packs/day: 0.5 packs/day for 54.7 years (27.4 ttl pk-yrs)    Types: Cigarettes    Start date: 01/22/1969   Smokeless tobacco: Never   Tobacco comments:    Continues to go down on number per day to 5-7 per day.   Vaping Use   Vaping status: Never Used  Substance and Sexual Activity   Alcohol use: Not Currently    Alcohol/week: 1.0 - 2.0 standard drink of alcohol    Types: 1 Glasses of wine per week    Comment: occasional   Drug use: No   Sexual activity: Not Currently    Birth control/protection: None  Other Topics Concern   Not on file  Social History Narrative    Lives alone   Social Drivers of Health   Financial Resource Strain: Medium Risk (08/03/2023)   Received from [facility]   Overall Financial Resource Strain (CARDIA)    Difficulty of Paying Living Expenses: Somewhat hard  Food Insecurity: No Food Insecurity (08/19/2023)   Hunger Vital Sign    Worried About Running Out of Food in the Last Year: Never true    Ran Out of Food in the Last Year: Never true  Transportation Needs: No Transportation Needs (08/19/2023)   PRAPARE - Administrator, Civil Service (Medical): No    Lack of Transportation (Non-Medical): No  Physical Activity: Inactive (06/15/2017)   Exercise Vital Sign    Days of Exercise per Week: 0 days    Minutes of Exercise per Session: 0 min  Stress: Stress Concern Present (06/15/2017)   [employer] of Occupational Health - Occupational Stress Questionnaire    Feeling of Stress : Very much  Social Connections: Socially Isolated (06/15/2017)   Social Connection and Isolation Panel    Frequency of Communication with Friends and Family: Never    Frequency of Social Gatherings with Friends and Family: Never    Attends Religious Services: Never    Database administrator or Organizations:  No    Attends Club or Organization Meetings: Never    Marital Status: Divorced  Catering manager Violence: Not At Risk (08/19/2023)   Humiliation, Afraid, Rape, and Kick questionnaire    Fear of Current or Ex-Partner: No    Emotionally Abused: No    Physically Abused: No    Sexually Abused: No    Family History  Problem Relation Age of Onset   Hypertension Father    Diabetes Father    Heart disease Father    Blindness Father    Alcohol abuse Sister    Drug abuse Sister    Anxiety disorder Sister    Depression Sister    Obesity Brother    Arthritis Brother    Alcohol abuse Brother    Drug abuse Brother    Depression Brother    Breast cancer Cousin    Clotting disorder Neg Hx      Current  Outpatient Medications:    alendronate (FOSAMAX) 70 MG tablet, Take 70 mg by mouth once a week., Disp: , Rfl:    atorvastatin (LIPITOR) 20 MG tablet, Take 20 mg by mouth at bedtime., Disp: , Rfl:    buPROPion  (WELLBUTRIN  XL) 150 MG 24 hr tablet, TAKE 1 TABLET BY MOUTH DAILY ALONG WITH 300 MG TABLET FOR TOTAL OF 450 MG DAILY, Disp: 90 tablet, Rfl: 1   buPROPion  (WELLBUTRIN  XL) 300 MG 24 hr tablet, Take 1 tablet (300 mg total) by mouth daily. Take along with 150 mg daily , total of 450 mg daily, Disp: 90 tablet, Rfl: 1   Calcium  Carb-Cholecalciferol 500-10 MG-MCG TABS, Take 1 tablet by mouth., Disp: , Rfl:    calcium -vitamin D  (OSCAL WITH D) 500-200 MG-UNIT tablet, Take 1 tablet by mouth daily with breakfast., Disp: , Rfl:    cephALEXin  (KEFLEX ) 500 MG capsule, Take 500 mg by mouth 4 (four) times daily., Disp: , Rfl:    fluticasone  (FLONASE ) 50 MCG/ACT nasal spray, Place into both nostrils as needed for allergies or rhinitis., Disp: , Rfl:    furosemide  (LASIX ) 20 MG tablet, Take 20 mg by mouth daily. , Disp: , Rfl:    hydrOXYzine  (ATARAX ) 10 MG tablet, Take 2 tablets (20 mg total) by mouth at bedtime as needed. for sleep and anxiety, Disp: 180 tablet, Rfl: 0   letrozole  (FEMARA ) 2.5 MG tablet, Take 1 tablet (2.5 mg total) by mouth daily., Disp: 30 tablet, Rfl: 3   levothyroxine  (SYNTHROID , LEVOTHROID) 112 MCG tablet, , Disp: , Rfl:    OLANZapine  (ZYPREXA ) 5 MG tablet, Take 1 tablet (5 mg total) by mouth at bedtime., Disp: 90 tablet, Rfl: 3   omeprazole (PRILOSEC) 40 MG capsule, Take 40 mg by mouth daily. , Disp: , Rfl:    oxyCODONE  (OXY IR/ROXICODONE ) 5 MG immediate release tablet, Take 0.5-1 tablets (2.5-5 mg total) by mouth every 8 (eight) hours as needed for severe pain (pain score 7-10) (for pain unrelieved by tylenol )., Disp: 30 tablet, Rfl: 0   prazosin  (MINIPRESS ) 2 MG capsule, TAKE ONE CAPSULE BY MOUTH AT BEDTIME., Disp: 90 capsule, Rfl: 1   dexamethasone  (DECADRON ) 4 MG tablet, Take 2  tablets daily x 3 days starting the day after chemotherapy. Take with food., Disp: 30 tablet, Rfl: 1   ondansetron  (ZOFRAN ) 8 MG tablet, Take 1 tablet (8 mg total) by mouth every 8hrs as needed for nausea or vomiting. Start on the third day after cisplatin., Disp: 30 tablet, Rfl: 1   prochlorperazine  (COMPAZINE ) 10 MG tablet, Take 1 tablet (  10 mg total) by mouth every 6 (six) hours as needed for nausea or vomiting., Disp: 30 tablet, Rfl: 1  Physical exam:  Vitals:   10/11/23 1003 10/11/23 1007  BP: (!) 140/90 (!) 140/90  Pulse: 80   Resp: 18   Temp: 97.7 F (36.5 C)   TempSrc: Tympanic   SpO2: 97%   Weight: 183 lb (83 kg)    Physical Exam Cardiovascular:     Rate and Rhythm: Normal rate and regular rhythm.     Heart sounds: Normal heart sounds.  Pulmonary:     Effort: Pulmonary effort is normal.     Breath sounds: Normal breath sounds.  Abdominal:     General: Bowel sounds are normal.     Palpations: Abdomen is soft.     Comments: External GYN exam was deferred today as per patient preference  Skin:    General: Skin is warm and dry.  Neurological:     Mental Status: She is alert and oriented to person, place, and time.      I have personally reviewed labs listed below:    Latest Ref Rng & Units 09/15/2023   12:03 PM  CMP  Glucose 70 - 99 mg/dL 92   BUN 8 - 23 mg/dL 13   Creatinine 9.55 - 1.00 mg/dL 8.79   Sodium 864 - 854 mmol/L 133   Potassium 3.5 - 5.1 mmol/L 4.2   Chloride 98 - 111 mmol/L 97   CO2 22 - 32 mmol/L 29   Calcium  8.9 - 10.3 mg/dL 8.6   Total Protein 6.5 - 8.1 g/dL 7.5   Total Bilirubin 0.0 - 1.2 mg/dL 0.6   Alkaline Phos 38 - 126 U/L 227   AST 15 - 41 U/L 57   ALT 0 - 44 U/L 57       Latest Ref Rng & Units 09/15/2023   12:03 PM  CBC  WBC 4.0 - 10.5 K/uL 8.4   Hemoglobin 12.0 - 15.0 g/dL 87.2   Hematocrit 63.9 - 46.0 % 37.0   Platelets 150 - 400 K/uL 221       Assessment and plan- Patient is a 63 y.o. female with prior history of left  breast cancer stage I now with newly diagnosed squamous cell carcinoma of the vulva.  Discussed with the patient that she has fairly 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  This would constitute stage III disease.  She would not be a candidate for surgery at this time as per GYN oncology.  As such I would recommend concurrent chemoradiation with weekly cisplatin for her.  Patient does have an element of CKD with a creatinine that fluctuates between 1.2-1.3 which we will need to monitor closely while she is on cisplatin.  I would like to offer cisplatin at 40 mg/m weekly concurrent with radiation.  Discussed risks and benefits of cisplatin including all but not limited to nausea, vomiting, low blood counts, risk of peripheral neuropathy hair loss, kidney injury and hearing loss as well as risk of infection and hospitalization.  Patient understands and agrees to proceed as planned but would like to wait until second week of August before proceeding with any treatment.  She also wishes to hold off on any port placement at this time.  We will arrange for chemo class prior to starting treatment.  I will see her back during second week of  August to start chemotherapy.   Cancer Staging  Malignant neoplasm of upper-inner quadrant of left breast in female, estrogen receptor positive (HCC) Staging form: Breast, AJCC 8th Edition - Clinical stage from 03/10/2022: Stage IA (cT1a, cN0, cM0, G2, ER+, PR+, HER2-) - Signed by [name], MD on 03/10/2022 Histologic grading system: 3 grade system - Pathologic stage from 04/07/2022: Stage IA (pT1a, pN0, cM0, G3, ER+, PR+, HER2-, Oncotype DX score: 28) - Signed by [name], MD on 04/13/2022 Stage prefix: Initial diagnosis Multigene prognostic tests performed: Oncotype DX Recurrence score range: Greater than or equal to 11 Histologic grading system:  3 grade system  Vulvar cancer (HCC) Staging form: Vulva, AJCC V9 - Clinical stage from 10/11/2023: FIGO Stage IIIB (cT2, cN1b, cM0) - Signed by [name], MD on 10/11/2023 Stage prefix: Initial diagnosis     Visit Diagnosis 1. Vulvar cancer (HCC)      Dr. Annah Melanee, MD, MPH [facility] at [facility] [phone] 10/11/2023 6:30 PM

## 2023-10-11 NOTE — Progress Notes (Signed)
 Palliative Medicine [facility] at [facility] Telephone:([phone] [phone]  Name: Patricia Good Date: 10/11/2023 MRN: [id number]  DOB: 07-29-1960  Patient Care Team: Sampson Ethridge LABOR, MD as PCP - General (Internal Medicine) [name] Shasta POUR, RN as Oncology Nurse Navigator Maurie Rayfield BIRCH, RN as Oncology Nurse Navigator Melanee Annah JAYSON, MD as Consulting Physician (Oncology) Lenn Aran, MD as Consulting Physician (Radiation Oncology)   REASON FOR CONSULTATION: DEMETRIUS Good is a 63 y.o. female with multiple medical problems including schizoaffective disorder, PTSD, social anxiety disorder, COPD, breast cancer, melanoma, hypothyroidism, OA, bipolar, newly diagnosed with vulvar cancer who presents to palliative care clinic for symptom management.  SOCIAL HISTORY:     reports that she has been smoking cigarettes. She started smoking about 54 years ago. She has a 27.4 pack-year smoking history. She has never used smokeless tobacco. She reports that she does not currently use alcohol after a past usage of about 1.0 - 2.0 standard drink of alcohol per week. She reports that she does not use drugs.  ADVANCE DIRECTIVES:  None on file  CODE STATUS: Full code  PAST MEDICAL HISTORY: Past Medical History:  Diagnosis Date   Abnormal thyroid  stimulating hormone (TSH) level    Anemia    Anxiety    Asthma    Bipolar 1 disorder (HCC)    COPD (chronic obstructive pulmonary disease) (HCC)    Endometriosis    GERD (gastroesophageal reflux disease)    Hepatitis A    Hypertension    Hypothyroidism    Malignant neoplasm of upper-inner quadrant of left breast in female, 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   Personal history of radiation therapy    Pre-diabetes    PTSD (post-traumatic stress disorder)    Schizoaffective disorder (HCC)     PAST SURGICAL HISTORY:  Past Surgical History:  Procedure Laterality Date   BREAST BIOPSY Left  03/03/2022   Left breast stereo bx Ribbon Clip- path pending   BREAST BIOPSY Left 03/03/2022   MM LT BREAST BX W LOC DEV 1ST LESION IMAGE BX SPEC STEREO GUIDE 03/03/2022 [facility]   BREAST LUMPECTOMY Left 03/20/2022   ECTOPIC PREGNANCY SURGERY     PART MASTECTOMY,RADIO FREQUENCY LOCALIZER,AXILLARY SENTINEL NODE BIOPSY Left 03/20/2022   Procedure: PART MASTECTOMY,RADIO FREQUENCY LOCALIZER,AXILLARY SENTINEL NODE BIOPSY;  Surgeon: Rodolph Romano, MD;  Location: [facility] ORS;  Service: General;  Laterality: Left;   TOTAL ABDOMINAL HYSTERECTOMY W/ BILATERAL SALPINGOOPHORECTOMY  1990    HEMATOLOGY/ONCOLOGY HISTORY:  Oncology History  Malignant neoplasm of upper-inner quadrant of left breast in female, estrogen receptor positive (HCC)  03/10/2022 Initial Diagnosis   Malignant neoplasm of upper-inner quadrant of left breast in female, estrogen receptor positive (HCC)   03/10/2022 Cancer Staging   Staging form: Breast, AJCC 8th Edition - Clinical stage from 03/10/2022: Stage IA (cT1a, cN0, cM0, G2, ER+, PR+, HER2-) - Signed by Melanee Annah JAYSON, MD on 03/10/2022 Histologic grading system: 3 grade system   04/07/2022 Cancer Staging   Staging form: Breast, AJCC 8th Edition - Pathologic stage from 04/07/2022: Stage IA (pT1a, pN0, cM0, G3, ER+, PR+, HER2-, Oncotype DX score: 28) - Signed by Melanee Annah JAYSON, MD on 04/13/2022 Stage prefix: Initial diagnosis Multigene prognostic tests performed: Oncotype DX Recurrence score range: Greater than or equal to 11 Histologic grading system: 3 grade system     ALLERGIES:  is allergic to shellfish allergy, sulfa antibiotics, sulfasalazine, and lisinopril.  MEDICATIONS:  Current Outpatient Medications  Medication Sig Dispense  Refill   alendronate (FOSAMAX) 70 MG tablet Take 70 mg by mouth once a week.     atorvastatin (LIPITOR) 20 MG tablet Take 20 mg by mouth at bedtime.     buPROPion  (WELLBUTRIN  XL) 150 MG 24 hr tablet TAKE 1 TABLET BY MOUTH DAILY ALONG  WITH 300 MG TABLET FOR TOTAL OF 450 MG DAILY 90 tablet 1   buPROPion  (WELLBUTRIN  XL) 300 MG 24 hr tablet Take 1 tablet (300 mg total) by mouth daily. Take along with 150 mg daily , total of 450 mg daily 90 tablet 1   Calcium  Carb-Cholecalciferol 500-10 MG-MCG TABS Take 1 tablet by mouth.     calcium -vitamin D  (OSCAL WITH D) 500-200 MG-UNIT tablet Take 1 tablet by mouth daily with breakfast.     cephALEXin  (KEFLEX ) 500 MG capsule Take 500 mg by mouth 4 (four) times daily.     fluticasone  (FLONASE ) 50 MCG/ACT nasal spray Place into both nostrils as needed for allergies or rhinitis.     furosemide  (LASIX ) 20 MG tablet Take 20 mg by mouth daily.      hydrOXYzine  (ATARAX ) 10 MG tablet Take 2 tablets (20 mg total) by mouth at bedtime as needed. for sleep and anxiety 180 tablet 0   letrozole  (FEMARA ) 2.5 MG tablet Take 1 tablet (2.5 mg total) by mouth daily. 30 tablet 3   levothyroxine  (SYNTHROID , LEVOTHROID) 112 MCG tablet      OLANZapine  (ZYPREXA ) 5 MG tablet Take 1 tablet (5 mg total) by mouth at bedtime. 90 tablet 3   omeprazole (PRILOSEC) 40 MG capsule Take 40 mg by mouth daily.      oxyCODONE  (OXY IR/ROXICODONE ) 5 MG immediate release tablet Take 0.5-1 tablets (2.5-5 mg total) by mouth every 8 (eight) hours as needed for severe pain (pain score 7-10) (for pain unrelieved by tylenol ). 30 tablet 0   prazosin  (MINIPRESS ) 2 MG capsule TAKE ONE CAPSULE BY MOUTH AT BEDTIME. 90 capsule 1   No current facility-administered medications for this visit.    VITAL SIGNS: There were no vitals taken for this visit. There were no vitals filed for this visit.  Estimated body mass index is 34.58 kg/m as calculated from the following:   Height as of 09/28/23: 5' 1 (1.549 m).   Weight as of an earlier encounter on 10/11/23: 183 lb (83 kg).  LABS: Labs per medical oncology  RADIOGRAPHIC STUDIES: No imaging on site today  PERFORMANCE STATUS (ECOG) : 1 - Symptomatic but completely ambulatory  Review of  Systems  Constitutional:  Negative for activity change, appetite change and fatigue.  Gastrointestinal:  Positive for constipation. Negative for abdominal pain, blood in stool, diarrhea and nausea.  Genitourinary:  Positive for genital sores, pelvic pain and vaginal pain. Negative for difficulty urinating.  Psychiatric/Behavioral:  Negative for self-injury and suicidal ideas.   Unless otherwise noted, a complete review of systems is negative.  General: NAD Pulmonary: no audible coughing or wheezing Abdomen: soft, nontender Extremities: no edema, no joint deformities Skin: no rashes Neurological: cooperative. Tearful.   IMPRESSION: Vulvar cancer - newly diagnosed. Reluctant to undergo chemotherapy or radiation due to prior history of and preconceptions of side effects. Strongly desired surgery, however, extent of disease not conducive to surgical resection. Recommendation from gyn-onc for concurrent chemo-radiation with curative intent. Patient agreeable, however, today she today expresses desire to postpone treatments. Primarily due to concern for side effects. She wishes to delay her treatment again today and sets date of second week of August. This is  due to potentially moving homes. She has follow up visit with Dr Coby, psychiatry. I encouraged her to reach out to psychiatry to see if this appointment could be moved up. I have reached out to Dr Coby who has placed patient on wait list for counseling services. I've also provided patient with information for local counseling services. Also provided her with information through FindHelp link as well as some local organizations. I encouraged her to seek counseling/CBT services and to not delay care. She cites understanding as well as desire to undergo treatments and understands that delaying treatment may mean worsening of her cancer.   Pain due to malignancy- currently well controlled. She has not re-trialed oxycodone  due to previous episode of  mood changes. Unlikely related. She has oxycodone  on hand with planned dose of 2.5-5 mg.   PLAN: - Continue Oxycodone  2.5-5 mg q8h as needed for pain.  - Continue Tylenol - not to exceed 3000 mg in 24 hour period - Follow up at next appointment with medical oncology on 11/15/23 with Josh- la  Patient expressed understanding and was in agreement with this plan. She also understands that She can call the clinic at any time with any questions, concerns, or complaints.   Time Total: 25 minutes  Visit consisted of counseling and education dealing with the complex and emotionally intense issues of symptom management and palliative care in the setting of serious and potentially life-threatening illness.Greater than 50%  of this time was spent counseling and coordinating care related to the above assessment and plan.  Signed by: Tinnie Dawn, DNP, AGNP-C, AOCNP [facility] [phone] (clinic)  CC: Dr Coby

## 2023-10-11 NOTE — Progress Notes (Signed)
 Patient is having more discomfort, she is feeling very fatigued.

## 2023-10-12 ENCOUNTER — Ambulatory Visit: Admitting: Oncology

## 2023-10-12 ENCOUNTER — Encounter: Payer: Self-pay | Admitting: Oncology

## 2023-10-19 ENCOUNTER — Encounter: Payer: Self-pay | Admitting: Oncology

## 2023-10-19 ENCOUNTER — Telehealth: Payer: Self-pay | Admitting: *Deleted

## 2023-10-19 ENCOUNTER — Ambulatory Visit

## 2023-10-19 NOTE — Telephone Encounter (Signed)
 The patient called and said that she no longer has a address with street number it is now [address] [place], [place] [address].

## 2023-10-19 NOTE — Telephone Encounter (Signed)
 The patient called that she wants the reason with her hair to color it while on immunotherapy.She wants yes or no and if no why?

## 2023-10-19 NOTE — Telephone Encounter (Signed)
 Per Dr. Babara, ok to color her hair. She recommends for pt to use a Dye that she had used in the past, to prevent any reactions. Outbound call; informed of above.  Patient stated she did not call in this morning inquiring about hair coloring.  Said she spoke with someone either on Sunday or yesterday about an address change. Apologized for the confusion; patient has no other questions / concerns at this time.

## 2023-10-20 ENCOUNTER — Ambulatory Visit: Payer: 59 | Admitting: Oncology

## 2023-10-20 ENCOUNTER — Ambulatory Visit

## 2023-10-21 ENCOUNTER — Ambulatory Visit

## 2023-10-22 ENCOUNTER — Telehealth: Payer: Self-pay | Admitting: *Deleted

## 2023-10-22 ENCOUNTER — Other Ambulatory Visit: Payer: Self-pay | Admitting: Hospice and Palliative Medicine

## 2023-10-22 ENCOUNTER — Ambulatory Visit

## 2023-10-22 MED ORDER — CEPHALEXIN 500 MG PO CAPS: 500.0000 mg | ORAL_CAPSULE | Freq: Three times a day (TID) | ORAL | 0 refills | Status: DC

## 2023-10-22 NOTE — Progress Notes (Signed)
 Spoke with patient by phone.  She reports recurrent furuncle in her groin area.  She was treated last month with course of cephalexin  for similar complaint with good response.  Will repeat course of antibiotics but patient advised to follow-up next week in clinic if no improvement.

## 2023-10-22 NOTE — Telephone Encounter (Signed)
 The patient has called and said that she feels like she is getting reinfected at the area of the cancer lesions at the vulva and she would like to have an antibiotic sent in so it does not get worse.  I called the patient and let her know that Dr. Babara is not here today but we are sending the message to the doctor on them for her.

## 2023-10-25 ENCOUNTER — Ambulatory Visit

## 2023-10-26 ENCOUNTER — Ambulatory Visit

## 2023-10-27 ENCOUNTER — Ambulatory Visit

## 2023-10-28 ENCOUNTER — Ambulatory Visit (INDEPENDENT_AMBULATORY_CARE_PROVIDER_SITE_OTHER): Admitting: Psychiatry

## 2023-10-28 ENCOUNTER — Ambulatory Visit

## 2023-10-28 ENCOUNTER — Other Ambulatory Visit: Payer: Self-pay

## 2023-10-28 ENCOUNTER — Encounter: Payer: Self-pay | Admitting: Psychiatry

## 2023-10-28 ENCOUNTER — Other Ambulatory Visit: Payer: Self-pay | Admitting: Nurse Practitioner

## 2023-10-28 VITALS — BP 119/77 | HR 98 | Temp 98.6°F | Ht 61.0 in | Wt 182.4 lb

## 2023-10-28 DIAGNOSIS — F4323 Adjustment disorder with mixed anxiety and depressed mood: Secondary | ICD-10-CM

## 2023-10-28 DIAGNOSIS — F250 Schizoaffective disorder, bipolar type: Secondary | ICD-10-CM | POA: Diagnosis not present

## 2023-10-28 DIAGNOSIS — F4010 Social phobia, unspecified: Secondary | ICD-10-CM | POA: Diagnosis not present

## 2023-10-28 DIAGNOSIS — Z634 Disappearance and death of family member: Secondary | ICD-10-CM | POA: Diagnosis not present

## 2023-10-28 DIAGNOSIS — F4310 Post-traumatic stress disorder, unspecified: Secondary | ICD-10-CM | POA: Diagnosis not present

## 2023-10-28 DIAGNOSIS — F17200 Nicotine dependence, unspecified, uncomplicated: Secondary | ICD-10-CM

## 2023-10-28 NOTE — Progress Notes (Signed)
 [facility] MD OP Progress Note  10/29/2023 9:52 AM Patricia Good  MRN:  [id number]  Chief Complaint:  Chief Complaint  Patient presents with   Follow-up   Schizophrenia   Medication Refill   Anxiety   Discussed the use of AI scribe software for clinical note transcription with the patient, who gave verbal consent to proceed.  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  She has a history of vulvular cancer with lymph node involvement.  She has been in contact with oncology.  She has not started treatment yet and plans to begin treatment in the second week of August. Her lesions are currently severe and cause burning during urination.  She has a history of schizoaffective disorder, PTSD, social anxiety disorder, and bereavement. She usually sleeps well and denies suicidal thoughts, hallucinations, paranoia, or muscle spasms. She is coping better with her grief from her mother's passing.  She smokes and has expressed concerns about her health and the potential pain associated with cancer treatment.  Patient today appeared to be receptive to psychotherapy provided in session.    Visit Diagnosis:    ICD-10-CM   1. Schizoaffective disorder, bipolar type (HCC)  F25.0     2. PTSD (post-traumatic stress disorder)  F43.10     3. Social anxiety disorder  F40.10     4. Bereavement  Z63.4     5. Tobacco use disorder  F17.200     6. Adjustment disorder with mixed anxiety and depressed mood  F43.23       Past Psychiatric History: I have reviewed past  psychiatric history from progress note on 07/16/2017.  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   Personal history of radiation therapy    Pre-diabetes    PTSD (post-traumatic stress disorder)    Schizoaffective disorder (HCC)     Past Surgical History:  Procedure Laterality Date   BREAST BIOPSY Left 03/03/2022   Left breast stereo bx Ribbon Clip- path pending   BREAST BIOPSY Left 03/03/2022   MM LT BREAST BX W LOC DEV 1ST LESION IMAGE BX SPEC STEREO GUIDE 03/03/2022 [facility]   BREAST LUMPECTOMY Left 03/20/2022   ECTOPIC PREGNANCY SURGERY     PART MASTECTOMY,RADIO FREQUENCY LOCALIZER,AXILLARY SENTINEL NODE BIOPSY Left 03/20/2022   Procedure: PART MASTECTOMY,RADIO FREQUENCY LOCALIZER,AXILLARY SENTINEL NODE BIOPSY;  Surgeon: Rodolph Romano, MD;  Location: [facility] ORS;  Service: General;  Laterality: Left;   TOTAL ABDOMINAL HYSTERECTOMY W/ BILATERAL SALPINGOOPHORECTOMY  1990    Family Psychiatric History: I have reviewed family psychiatric history from progress note on 07/16/2017.  Family History:  Family History  Problem Relation Age of Onset   Hypertension Father  Diabetes Father    Heart disease Father    Blindness Father    Alcohol abuse Sister    Drug abuse Sister    Anxiety disorder Sister    Depression Sister    Obesity Brother    Arthritis Brother    Alcohol abuse Brother    Drug abuse Brother    Depression Brother    Breast cancer Cousin    Clotting disorder Neg Hx     Social History: I have reviewed social history from progress note on  07/16/2017. Social History   Socioeconomic History   Marital status: Single    Spouse name: Not on file   Number of children: 0   Years of education: Not on file   Highest education level: Associate degree: occupational, Scientist, product/process development, or vocational program  Occupational History   Not on file  Tobacco Use   Smoking status: Every Day    Current packs/day: 0.50    Average packs/day: 0.5 packs/day for 54.8 years (27.4 ttl pk-yrs)    Types: Cigarettes    Start date: 01/22/1969   Smokeless tobacco: Never   Tobacco comments:    Continues to go down on number per day to 5-7 per day.   Vaping Use   Vaping status: Never Used  Substance and Sexual Activity   Alcohol use: Not Currently    Alcohol/week: 1.0 - 2.0 standard drink of alcohol    Types: 1 Glasses of wine per week    Comment: occasional   Drug use: No   Sexual activity: Not Currently    Birth control/protection: None  Other Topics Concern   Not on file  Social History Narrative   Lives alone   Social Drivers of Health   Financial Resource Strain: Medium Risk (08/03/2023)   Received from [facility]   Overall Financial Resource Strain (CARDIA)    Difficulty of Paying Living Expenses: Somewhat hard  Food Insecurity: No Food Insecurity (08/19/2023)   Hunger Vital Sign    Worried About Running Out of Food in the Last Year: Never true    Ran Out of Food in the Last Year: Never true  Transportation Needs: No Transportation Needs (08/19/2023)   PRAPARE - Administrator, Civil Service (Medical): No    Lack of Transportation (Non-Medical): No  Physical Activity: Inactive (06/15/2017)   Exercise Vital Sign    Days of Exercise per Week: 0 days    Minutes of Exercise per Session: 0 min  Stress: Stress Concern Present (06/15/2017)   [employer] of Occupational Health - Occupational Stress Questionnaire    Feeling of Stress : Very much  Social Connections: Socially Isolated (06/15/2017)   Social  Connection and Isolation Panel    Frequency of Communication with Friends and Family: Never    Frequency of Social Gatherings with Friends and Family: Never    Attends Religious Services: Never    Database administrator or Organizations: No    Attends Banker Meetings: Never    Marital Status: Divorced    Allergies:  Allergies  Allergen Reactions   Shellfish Allergy Anaphylaxis    Only to crab meat (non-imitation), not shrimp   Sulfa Antibiotics Hives, Anaphylaxis and Other (See Comments)   Sulfasalazine Anaphylaxis and Hives    Other reaction(s): Other (See Comments)   Lisinopril     unknown    Metabolic Disorder Labs: Lab Results  Component Value Date   HGBA1C 5.7 (H) 05/06/2020   MPG 116.89  05/06/2020   MPG 117 12/21/2017   Lab Results  Component Value Date   PROLACTIN 1.3 (L) 05/06/2020   PROLACTIN 1.2 (L) 07/12/2017   Lab Results  Component Value Date   CHOL 193 05/06/2020   TRIG 103 05/06/2020   HDL 56 05/06/2020   CHOLHDL 3.4 05/06/2020   VLDL 21 05/06/2020   LDLCALC 116 (H) 05/06/2020   LDLCALC 163 (H) 12/21/2017   Lab Results  Component Value Date   TSH 2.250 06/17/2021   TSH 2.143 05/06/2020    Therapeutic Level Labs: No results found for: LITHIUM No results found for: VALPROATE No results found for: CBMZ  Current Medications: Current Outpatient Medications  Medication Sig Dispense Refill   alendronate (FOSAMAX) 70 MG tablet Take 70 mg by mouth once a week.     atorvastatin (LIPITOR) 20 MG tablet Take 20 mg by mouth at bedtime.     buPROPion  (WELLBUTRIN  XL) 150 MG 24 hr tablet TAKE 1 TABLET BY MOUTH DAILY ALONG WITH 300 MG TABLET FOR TOTAL OF 450 MG DAILY 90 tablet 1   buPROPion  (WELLBUTRIN  XL) 300 MG 24 hr tablet Take 1 tablet (300 mg total) by mouth daily. Take along with 150 mg daily , total of 450 mg daily 90 tablet 1   Calcium  Carb-Cholecalciferol 500-10 MG-MCG TABS Take 1 tablet by mouth.     calcium -vitamin D  (OSCAL  WITH D) 500-200 MG-UNIT tablet Take 1 tablet by mouth daily with breakfast.     cephALEXin  (KEFLEX ) 500 MG capsule Take 1 capsule (500 mg total) by mouth 3 (three) times daily. 30 capsule 0   dexamethasone  (DECADRON ) 4 MG tablet Take 2 tablets daily x 3 days starting the day after chemotherapy. Take with food. 30 tablet 1   fluticasone  (FLONASE ) 50 MCG/ACT nasal spray Place into both nostrils as needed for allergies or rhinitis.     furosemide  (LASIX ) 20 MG tablet Take 20 mg by mouth daily.      hydrOXYzine  (ATARAX ) 10 MG tablet Take 2 tablets (20 mg total) by mouth at bedtime as needed. for sleep and anxiety 180 tablet 0   letrozole  (FEMARA ) 2.5 MG tablet Take 1 tablet (2.5 mg total) by mouth daily. 30 tablet 3   levothyroxine  (SYNTHROID , LEVOTHROID) 112 MCG tablet      OLANZapine  (ZYPREXA ) 5 MG tablet Take 1 tablet (5 mg total) by mouth at bedtime. 90 tablet 3   omeprazole (PRILOSEC) 40 MG capsule Take 40 mg by mouth daily.      ondansetron  (ZOFRAN ) 8 MG tablet Take 1 tablet (8 mg total) by mouth every 8hrs as needed for nausea or vomiting. Start on the third day after cisplatin. 30 tablet 1   oxyCODONE  (OXY IR/ROXICODONE ) 5 MG immediate release tablet Take 0.5-1 tablets (2.5-5 mg total) by mouth every 8 (eight) hours as needed for severe pain (pain score 7-10) (for pain unrelieved by tylenol ). 30 tablet 0   prazosin  (MINIPRESS ) 2 MG capsule TAKE ONE CAPSULE BY MOUTH AT BEDTIME. 90 capsule 1   prochlorperazine  (COMPAZINE ) 10 MG tablet Take 1 tablet (10 mg total) by mouth every 6 (six) hours as needed for nausea or vomiting. 30 tablet 1   No current facility-administered medications for this visit.     Musculoskeletal: Strength & Muscle Tone: within normal limits Gait & Station: normal Patient leans: N/A  Psychiatric Specialty Exam: Review of Systems  Psychiatric/Behavioral:  The patient is nervous/anxious.        Grief    Blood pressure 119/77,  pulse 98, temperature 98.6 F (37 C),  temperature source Temporal, height 5' 1 (1.549 m), weight 182 lb 6.4 oz (82.7 kg).Body mass index is 34.46 kg/m.  General Appearance: Casual  Eye Contact:  Fair  Speech:  Clear and Coherent  Volume:  Normal  Mood:  Anxious and grieving  Affect:  Congruent  Thought Process:  Goal Directed and Descriptions of Associations: Intact  Orientation:  Full (Time, Place, and Person)  Thought Content: Logical   Suicidal Thoughts:  No  Homicidal Thoughts:  No  Memory:  Immediate;   Fair Recent;   Fair Remote;   Fair  Judgement:  Fair  Insight:  Fair  Psychomotor Activity:  Normal  Concentration:  Concentration: Fair and Attention Span: Fair  Recall:  [employer] of Knowledge: Fair  Language: Fair  Akathisia:  No  Handed:  Right  AIMS (if indicated): done  Assets:  Communication Skills Desire for Improvement Housing Social Support  ADL's:  Intact  Cognition: WNL  Sleep:  Fair   Screenings: Midwife Visit from 09/28/2023 in [facility] Office Visit from 09/09/2023 in [facility] Office Visit from 06/01/2023 in [facility] Office Visit from 04/15/2023 in [facility] Office Visit from 01/13/2023 in [facility]  AIMS Total Score 0 0 0 0 0   AUDIT    Flowsheet Row Admission (Discharged) from 12/22/2017 in [facility]  Alcohol Use Disorder Identification Test Final Score (AUDIT) 1   GAD-7    Flowsheet Row Office Visit from 06/01/2023 in [facility] Office Visit from 04/15/2023 in [facility] Office Visit from 01/13/2023 in [facility] Office Visit from 08/26/2022 in [facility] Office Visit from  06/18/2022 in [facility]  Total GAD-7 Score 4 2 2 1 3    PHQ2-9    Flowsheet Row Office Visit from 08/18/2023 in [facility] Office Visit from 06/01/2023 in [facility] Office Visit from 04/15/2023 in [facility] Office Visit from 01/13/2023 in [facility] Office Visit from 08/26/2022 in [facility]  PHQ-2 Total Score 0 1 4 0 0  PHQ-9 Total Score -- 4 12 -- 1   Flowsheet Row Office Visit from 10/28/2023 in [facility] Office Visit from 09/28/2023 in [facility] Office Visit from 09/09/2023 in [facility]  C-SSRS RISK CATEGORY Moderate Risk No Risk Moderate Risk     Assessment and Plan: Patricia Good is a 63 year old Caucasian female who was evaluated in office today for a follow-up appointment.  Discussed assessment and plan as noted below.  Adjustment disorder with mixed anxiety and depressed mood-unstable Currently having trouble coping with her cancer diagnosis.  Has been in touch with oncology however she has been delaying treatment since the past several weeks.  Receptive to counseling today.  Schizoaffective disorder-stable Currently denies any significant mood swings or hallucinations. Continue Olanzapine  5 mg daily Continue Wellbutrin  450 mg daily.  Posttraumatic stress disorder-stable Does have certain triggers which causes intrusive memories although she is overall coping well. Continue Prazosin  2  mg at bedtime  Social anxiety disorder-unstable Continues to have anxiety which limited social contacts. Provided brief psychotherapy. Continue Hydroxyzine  10 to 20 mg at bedtime as  needed  Bereavement-improving Currently coping with grief of her mother's passing better. Will monitor closely  Tobacco use disorder-unstable Continues to smoke half a pack of cigarettes per day.  Not interested in quitting.   Provided brief therapy sessions focusing on acceptance and mindfulness strategies.  Discussed willingness to feel anxiety symptoms without resistance.  Identified small value driven steps to engage in this week.  Provided homework strategies to work on.  Patient seemed to be receptive in session and voiced understanding.  Follow-up Follow-up in clinic in 1 to 2 weeks or sooner if needed.   Collaboration of Care: Collaboration of Care: Other encouraged to follow up with oncologist.  Patient/Guardian was advised Release of Information must be obtained prior to any record release in order to collaborate their care with an outside provider. Patient/Guardian was advised if they have not already done so to contact the registration department to sign all necessary forms in order for us  to release information regarding their care.   Consent: Patient/Guardian gives verbal consent for treatment and assignment of benefits for services provided during this visit. Patient/Guardian expressed understanding and agreed to proceed.   This note was generated in part or whole with voice recognition software. Voice recognition is usually quite accurate but there are transcription errors that can and very often do occur. I apologize for any typographical errors that were not detected and corrected.    Orpha Dain, MD 10/29/2023, 9:52 AM

## 2023-10-28 NOTE — Progress Notes (Signed)
 Pharmacist Chemotherapy Monitoring - Initial Assessment    Anticipated start date: 11/06/23   The following has been reviewed per standard work regarding the patient's treatment regimen: The patient's diagnosis, treatment plan and drug doses, and organ/hematologic function Lab orders and baseline tests specific to treatment regimen  The treatment plan start date, drug sequencing, and pre-medications Prior authorization status  Patient's documented medication list, including drug-drug interaction screen and prescriptions for anti-emetics and supportive care specific to the treatment regimen The drug concentrations, fluid compatibility, administration routes, and timing of the medications to be used The patient's access for treatment and lifetime cumulative dose history, if applicable  The patient's medication allergies and previous infusion related reactions, if applicable    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  This would constitute stage III disease.  She would not be a candidate for surgery at this time as per GYN oncology.  Recommend concurrent chemoradiation with weekly cisplatin  Follow up needed:  Monitor renal function due to moderate renal insufficiency (Srcr 1.2)   Redell JINNY Gaskins, RPH, 10/28/2023  3:55 PM

## 2023-10-29 ENCOUNTER — Encounter: Payer: Self-pay | Admitting: Psychiatry

## 2023-10-29 ENCOUNTER — Ambulatory Visit

## 2023-11-01 ENCOUNTER — Ambulatory Visit

## 2023-11-02 ENCOUNTER — Inpatient Hospital Stay

## 2023-11-02 ENCOUNTER — Ambulatory Visit

## 2023-11-03 ENCOUNTER — Ambulatory Visit

## 2023-11-03 ENCOUNTER — Other Ambulatory Visit

## 2023-11-04 ENCOUNTER — Inpatient Hospital Stay

## 2023-11-04 ENCOUNTER — Ambulatory Visit
Admission: RE | Admit: 2023-11-04 | Discharge: 2023-11-04 | Disposition: A | Discharge status: Home / Self Care | Source: Ambulatory Visit | Attending: Radiation Oncology | Admitting: Radiation Oncology

## 2023-11-04 ENCOUNTER — Ambulatory Visit

## 2023-11-04 DIAGNOSIS — C519 Malignant neoplasm of vulva, unspecified: Secondary | ICD-10-CM

## 2023-11-04 NOTE — Progress Notes (Signed)
 [facility] Psychosocial Distress Screening Clinical Social Work  Patricia Good is a 63 y.o. year old female. Clinical Social Work was referred by nurse navigator for positive distress screening. The patient scored a 6 on the Psychosocial Distress Thermometer which indicates moderate distress. Clinical Social Worker contacted patient by phone to assess for distress and other psychosocial needs.     Distress Screen:    11/04/2023    2:20 PM  ONCBCN DISTRESS SCREENING  Screening Type Initial Screening  How much distress have you been experiencing in the past week? (0-10) 6  Practical concerns type Housing/Utilities  Emotional concerns type Sadness or depression  Physical Concerns Type  Fatigue;Tobacco use;Memory or concentration     Interventions: Provided patient with information about the [employer], which she qualifies for.  Sent referral to [facility].  Patient currently lives in a boarding house, but is actively seeking an apartment.  She is also interested in the food pantry and [employer].  Sent referral to [place].  Patient is separated from her husband.  She does not have any children.  Patient receives SSD and food stamps.   CSW and patient discussed common feeling and emotions when being diagnosed with cancer, and the importance of support during treatment.  CSW informed patient of the support team and support services at [facility].  CSW provided contact information and encouraged patient to call with any questions or concerns.   Follow Up Plan: CSW will follow-up with patient by phone  Patient verbalizes understanding of plan: Yes    Macario CHRISTELLA Au, LCSW     Patient is participating in a Managed Medicaid Plan:  Yes

## 2023-11-05 ENCOUNTER — Telehealth (INDEPENDENT_AMBULATORY_CARE_PROVIDER_SITE_OTHER): Admitting: Psychiatry

## 2023-11-05 ENCOUNTER — Telehealth: Admitting: Psychiatry

## 2023-11-05 ENCOUNTER — Encounter: Payer: Self-pay | Admitting: Oncology

## 2023-11-05 ENCOUNTER — Ambulatory Visit

## 2023-11-05 ENCOUNTER — Encounter: Payer: Self-pay | Admitting: Psychiatry

## 2023-11-05 DIAGNOSIS — Z634 Disappearance and death of family member: Secondary | ICD-10-CM

## 2023-11-05 DIAGNOSIS — F4010 Social phobia, unspecified: Secondary | ICD-10-CM

## 2023-11-05 DIAGNOSIS — F4323 Adjustment disorder with mixed anxiety and depressed mood: Secondary | ICD-10-CM

## 2023-11-05 DIAGNOSIS — F4310 Post-traumatic stress disorder, unspecified: Secondary | ICD-10-CM

## 2023-11-05 DIAGNOSIS — F250 Schizoaffective disorder, bipolar type: Secondary | ICD-10-CM | POA: Diagnosis not present

## 2023-11-05 DIAGNOSIS — F17200 Nicotine dependence, unspecified, uncomplicated: Secondary | ICD-10-CM

## 2023-11-05 MED ORDER — CLONAZEPAM 0.5 MG PO TABS: 0.5000 mg | ORAL_TABLET | Freq: Every day | ORAL | 0 refills | Status: DC | PRN

## 2023-11-05 MED FILL — Fosaprepitant Dimeglumine For IV Infusion 150 MG (Base Eq): INTRAVENOUS | Qty: 5 | Status: AC

## 2023-11-05 NOTE — Progress Notes (Signed)
 Virtual Visit via Video Note  I connected with Patricia Good on 11/05/23 at 11:30 AM EDT by a video enabled telemedicine application and verified that I am speaking with the correct person using two identifiers.  Location Provider Location : [facility] Patient Location : Home  Participants: Patient , Provider   I discussed the limitations of evaluation and management by telemedicine and the availability of in person appointments. The patient expressed understanding and agreed to proceed.   I discussed the assessment and treatment plan with the patient. The patient was provided an opportunity to ask questions and all were answered. The patient agreed with the plan and demonstrated an understanding of the instructions.   The patient was advised to call back or seek an in-person evaluation if the symptoms worsen or if the condition fails to improve as anticipated.   BH MD OP Progress Note  11/05/2023 12:02 PM KLARISA BARMAN  MRN:  [id number]  Chief Complaint:  Chief Complaint  Patient presents with   Follow-up   Schizophrenia   Medication Refill   Anxiety   Discussed the use of AI scribe software for clinical note transcription with the patient, who gave verbal consent to proceed.  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  Anticipation of starting chemotherapy and radiation for cancer leads her to report anxiety and apprehension, especially given her previous experience with bothersome side effects from chemotherapy. She uses sleep to avoid anxious thoughts as a coping  mechanism, though she acknowledges the limitations of this strategy. She felt more at ease about the upcoming treatment after attending a recent chemotherapy class.  She denies any current mood symptoms and describes her anxiety has improved compared to last visit. Grief related to her mother's passing results in good and bad days, but she feels she is coping adequately. She denies current sleep problems, auditory or visual hallucinations, and paranoia.  She denies thoughts of harming herself or others. She reports currently taking Wellbutrin , olanzapine , and prazosin .  She reports current use of prescribed oxycodone  on an as-needed basis, stating use only when experiencing significant pain. She indicates a prescription of 30 pills, with 26 remaining, and denies daily use.  She lives alone in a rooming house and owns a midsize truck for transportation. She reports not being in touch with family members lately.    Visit Diagnosis:    ICD-10-CM   1. Schizoaffective disorder, bipolar type (HCC)  F25.0     2. PTSD (post-traumatic stress disorder)  F43.10     3. Social anxiety disorder  F40.10 clonazePAM (KLONOPIN) 0.5 MG tablet    4. Bereavement  Z63.4     5. Tobacco use disorder  F17.200     6. Adjustment disorder with mixed anxiety and depressed mood  F43.23 clonazePAM (KLONOPIN) 0.5 MG tablet      Past Psychiatric History: I have reviewed past psychiatric history from progress note on 07/16/2017.  Past Medical History:  Past Medical History:  Diagnosis Date   Abnormal thyroid  stimulating hormone (TSH) level    Anemia    Anxiety    Asthma    Bipolar 1 disorder (HCC)    COPD (chronic  obstructive pulmonary disease) (HCC)    Endometriosis    GERD (gastroesophageal reflux disease)    Hepatitis A    Hypertension    Hypothyroidism    Malignant neoplasm of upper-inner quadrant of left breast in female, 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   Personal history of radiation therapy     Pre-diabetes    PTSD (post-traumatic stress disorder)    Schizoaffective disorder [facility]     Past Surgical History:  Procedure Laterality Date   BREAST BIOPSY Left 03/03/2022   Left breast stereo bx Ribbon Clip- path pending   BREAST BIOPSY Left 03/03/2022   MM LT BREAST BX W LOC DEV 1ST LESION IMAGE BX SPEC STEREO GUIDE 03/03/2022 [facility]   BREAST LUMPECTOMY Left 03/20/2022   ECTOPIC PREGNANCY SURGERY     PART MASTECTOMY,RADIO FREQUENCY LOCALIZER,AXILLARY SENTINEL NODE BIOPSY Left 03/20/2022   Procedure: PART MASTECTOMY,RADIO FREQUENCY LOCALIZER,AXILLARY SENTINEL NODE BIOPSY;  Surgeon: Rodolph Romano, MD;  Location: [facility] ORS;  Service: General;  Laterality: Left;   TOTAL ABDOMINAL HYSTERECTOMY W/ BILATERAL SALPINGOOPHORECTOMY  1990    Family Psychiatric History: I reviewed family psychiatric history from progress note on 07/16/2017.  Family History:  Family History  Problem Relation Age of Onset   Hypertension Father    Diabetes Father    Heart disease Father    Blindness Father    Alcohol abuse Sister    Drug abuse Sister    Anxiety disorder Sister    Depression Sister    Obesity Brother    Arthritis Brother    Alcohol abuse Brother    Drug abuse Brother    Depression Brother    Breast cancer Cousin    Clotting disorder Neg Hx     Social History: Reviewed social history from progress note on 07/16/2017. Social History   Socioeconomic History   Marital status: Single    Spouse name: Not on file   Number of children: 0   Years of education: Not on file   Highest education level: Associate degree: occupational, Scientist, product/process development, or vocational program  Occupational History   Not on file  Tobacco Use   Smoking status: Every Day    Current packs/day: 0.50    Average packs/day: 0.5 packs/day for 54.8 years (27.4 ttl pk-yrs)    Types: Cigarettes    Start date: 01/22/1969   Smokeless tobacco: Never   Tobacco comments:    Continues to go down on number per  day to 5-7 per day.   Vaping Use   Vaping status: Never Used  Substance and Sexual Activity   Alcohol use: Not Currently    Alcohol/week: 1.0 - 2.0 standard drink of alcohol    Types: 1 Glasses of wine per week    Comment: occasional   Drug use: No   Sexual activity: Not Currently    Birth control/protection: None  Other Topics Concern   Not on file  Social History Narrative   Lives alone   Social Drivers of Health   Financial Resource Strain: Medium Risk (08/03/2023)   Received from [facility]   Overall Financial Resource Strain (CARDIA)    Difficulty of Paying Living Expenses: Somewhat hard  Food Insecurity: No Food Insecurity (08/19/2023)   Hunger Vital Sign    Worried About Running Out of Food in the Last Year: Never true    Ran Out of Food in the Last Year: Never true  Transportation Needs: No Transportation Needs (08/19/2023)   PRAPARE - Transportation    Lack of  Transportation (Medical): No    Lack of Transportation (Non-Medical): No  Physical Activity: Inactive (06/15/2017)   Exercise Vital Sign    Days of Exercise per Week: 0 days    Minutes of Exercise per Session: 0 min  Stress: Stress Concern Present (06/15/2017)   [employer] of Occupational Health - Occupational Stress Questionnaire    Feeling of Stress : Very much  Social Connections: Socially Isolated (06/15/2017)   Social Connection and Isolation Panel    Frequency of Communication with Friends and Family: Never    Frequency of Social Gatherings with Friends and Family: Never    Attends Religious Services: Never    Database administrator or Organizations: No    Attends Banker Meetings: Never    Marital Status: Divorced    Allergies:  Allergies  Allergen Reactions   Shellfish Allergy Anaphylaxis    Only to crab meat (non-imitation), not shrimp   Sulfa Antibiotics Hives, Anaphylaxis and Other (See Comments)   Sulfasalazine Anaphylaxis and Hives    Other reaction(s):  Other (See Comments)   Lisinopril     unknown    Metabolic Disorder Labs: Lab Results  Component Value Date   HGBA1C 5.7 (H) 05/06/2020   MPG 116.89 05/06/2020   MPG 117 12/21/2017   Lab Results  Component Value Date   PROLACTIN 1.3 (L) 05/06/2020   PROLACTIN 1.2 (L) 07/12/2017   Lab Results  Component Value Date   CHOL 193 05/06/2020   TRIG 103 05/06/2020   HDL 56 05/06/2020   CHOLHDL 3.4 05/06/2020   VLDL 21 05/06/2020   LDLCALC 116 (H) 05/06/2020   LDLCALC 163 (H) 12/21/2017   Lab Results  Component Value Date   TSH 2.250 06/17/2021   TSH 2.143 05/06/2020    Therapeutic Level Labs: No results found for: LITHIUM No results found for: VALPROATE No results found for: CBMZ  Current Medications: Current Outpatient Medications  Medication Sig Dispense Refill   clonazePAM (KLONOPIN) 0.5 MG tablet Take 1 tablet (0.5 mg total) by mouth daily as needed for anxiety. Please use it only for severe panic attacks, do not combine with Oxycodone  15 tablet 0   alendronate (FOSAMAX) 70 MG tablet Take 70 mg by mouth once a week.     atorvastatin (LIPITOR) 20 MG tablet Take 20 mg by mouth at bedtime.     buPROPion  (WELLBUTRIN  XL) 150 MG 24 hr tablet TAKE 1 TABLET BY MOUTH DAILY ALONG WITH 300 MG TABLET FOR TOTAL OF 450 MG DAILY 90 tablet 1   buPROPion  (WELLBUTRIN  XL) 300 MG 24 hr tablet Take 1 tablet (300 mg total) by mouth daily. Take along with 150 mg daily , total of 450 mg daily 90 tablet 1   Calcium  Carb-Cholecalciferol 500-10 MG-MCG TABS Take 1 tablet by mouth.     calcium -vitamin D  (OSCAL WITH D) 500-200 MG-UNIT tablet Take 1 tablet by mouth daily with breakfast.     cephALEXin  (KEFLEX ) 500 MG capsule Take 1 capsule (500 mg total) by mouth 3 (three) times daily. 30 capsule 0   dexamethasone  (DECADRON ) 4 MG tablet Take 2 tablets daily x 3 days starting the day after chemotherapy. Take with food. 30 tablet 1   fluticasone  (FLONASE ) 50 MCG/ACT nasal spray Place into both  nostrils as needed for allergies or rhinitis.     furosemide  (LASIX ) 20 MG tablet Take 20 mg by mouth daily.      hydrOXYzine  (ATARAX ) 10 MG tablet Take 2 tablets (20 mg total) by  mouth at bedtime as needed. for sleep and anxiety 180 tablet 0   letrozole  (FEMARA ) 2.5 MG tablet Take 1 tablet (2.5 mg total) by mouth daily. 30 tablet 3   levothyroxine  (SYNTHROID , LEVOTHROID) 112 MCG tablet      OLANZapine  (ZYPREXA ) 5 MG tablet Take 1 tablet (5 mg total) by mouth at bedtime. 90 tablet 3   omeprazole (PRILOSEC) 40 MG capsule Take 40 mg by mouth daily.      ondansetron  (ZOFRAN ) 8 MG tablet Take 1 tablet (8 mg total) by mouth every 8hrs as needed for nausea or vomiting. Start on the third day after cisplatin. 30 tablet 1   oxyCODONE  (OXY IR/ROXICODONE ) 5 MG immediate release tablet Take 0.5-1 tablets (2.5-5 mg total) by mouth every 8 (eight) hours as needed for severe pain (pain score 7-10) (for pain unrelieved by tylenol ). 30 tablet 0   prazosin  (MINIPRESS ) 2 MG capsule TAKE ONE CAPSULE BY MOUTH AT BEDTIME. 90 capsule 1   prochlorperazine  (COMPAZINE ) 10 MG tablet Take 1 tablet (10 mg total) by mouth every 6 (six) hours as needed for nausea or vomiting. 30 tablet 1   No current facility-administered medications for this visit.     Musculoskeletal: Strength & Muscle Tone: UTA Gait & Station: Seated Patient leans: N/A  Psychiatric Specialty Exam: Review of Systems  Psychiatric/Behavioral:  The patient is nervous/anxious.     There were no vitals taken for this visit.There is no height or weight on file to calculate BMI.  General Appearance: Casual  Eye Contact:  Fair  Speech:  Clear and Coherent  Volume:  Normal  Mood:  Anxious  Affect:  Congruent  Thought Process:  Goal Directed and Descriptions of Associations: Intact  Orientation:  Full (Time, Place, and Person)  Thought Content: Logical   Suicidal Thoughts:  No  Homicidal Thoughts:  No  Memory:  Immediate;   Fair Recent;    Fair Remote;   Fair  Judgement:  Fair  Insight:  Fair  Psychomotor Activity:  Normal  Concentration:  Concentration: Fair and Attention Span: Fair  Recall:  [employer] of Knowledge: Fair  Language: Fair  Akathisia:  No  Handed:  Right  AIMS (if indicated): not done  Assets:  Manufacturing systems engineer Desire for Improvement Housing Social Support Transportation  ADL's:  Intact  Cognition: WNL  Sleep:  Fair   Screenings: Midwife Visit from 09/28/2023 in [facility] Office Visit from 09/09/2023 in [facility] Office Visit from 06/01/2023 in [facility] Office Visit from 04/15/2023 in [facility] Office Visit from 01/13/2023 in [facility]  AIMS Total Score 0 0 0 0 0   AUDIT    Flowsheet Row Admission (Discharged) from 12/22/2017 in [facility]  Alcohol Use Disorder Identification Test Final Score (AUDIT) 1   GAD-7    Flowsheet Row Office Visit from 06/01/2023 in [facility] Office Visit from 04/15/2023 in [facility] Office Visit from 01/13/2023 in [facility] Office Visit from 08/26/2022 in [facility] Office Visit from 06/18/2022 in [facility]  Total GAD-7 Score 4 2 2 1 3    PHQ2-9    Flowsheet Row Office Visit from 08/18/2023 in [facility]  HILARIO Davene Donnamae Lionel Office Visit from 06/01/2023 in [facility] Office Visit from 04/15/2023 in [facility] Office Visit from 01/13/2023 in [facility] Office  Visit from 08/26/2022 in [facility]  PHQ-2 Total Score 0 1 4 0 0  PHQ-9 Total Score -- 4 12 -- 1   Flowsheet Row Video Visit from 11/05/2023 in [facility] Office Visit from 10/28/2023 in [facility] Office Visit from 09/28/2023 in [facility]  C-SSRS RISK CATEGORY Moderate Risk Moderate Risk No Risk     Assessment and Plan: TENESIA ESCUDERO is a 63 year old Caucasian female with schizophrenia, anxiety, recent diagnosis of vulvar  cancer initially refusing treatment however currently interested in engaging in treatment and has establish care with oncology, discussed assessment and plan as noted below.   Assessment & Plan Adjustment disorder with mixed anxiety and depressed mood-improving Undergoing chemotherapy and radiation for breast cancer. Expressed anxiety about treatment, which is normal given past experiences with chemotherapy. - Encourage access to a therapist at the cancer center for support. - Educate on mindfulness strategies and defusion exercises to manage anxiety. - Discussed leaves on a stream defusion. - Discussed breathing techniques for grounding. - Engaged in ACT,Supportive counseling strategies. - Start Clonazepam 0.5 mg as needed daily for severe anxiety attacks only.  Patient advised to limit use.  Advised not to combine with oxycodone . - Reviewed [place] PMP AWARxE   Schizoaffective disorder-stable Currently denies any significant hallucinations or depression symptoms. - Continue Olanzapine  5 mg daily - Continue Wellbutrin  450 mg daily   Posttraumatic stress disorder-stable - Currently denies any significant triggers or intrusive memories. - Continue Prazosin  2 mg at bedtime   Social anxiety disorder-unstable Patient with ongoing anxiety mostly in social situations. - Continue Hydroxyzine  10 to 20 mg at  bedtime as needed - Start Clonazepam as discussed above.   Bereavement-improving Currently working on coping strategies. - Provided grief counseling.  Tobacco use disorder-unstable Continues to smoke cigarettes. - Will reevaluate in future sessions  Reviewed notes per Ms.Lynn Duffy LCSW/oncology-dated 11/04/2023.  Follow-up Follow-up in clinic in 3 to 4 weeks or sooner if needed.  Consent: Patient/Guardian gives verbal consent for treatment and assignment of benefits for services provided during this visit. Patient/Guardian expressed understanding and agreed to proceed.   This note was generated in part or whole with voice recognition software. Voice recognition is usually quite accurate but there are transcription errors that can and very often do occur. I apologize for any typographical errors that were not detected and corrected.    Levelle Edelen, MD 11/05/2023, 12:02 PM

## 2023-11-07 ENCOUNTER — Other Ambulatory Visit: Payer: Self-pay | Admitting: Oncology

## 2023-11-08 ENCOUNTER — Other Ambulatory Visit: Payer: Self-pay

## 2023-11-08 ENCOUNTER — Other Ambulatory Visit: Payer: Self-pay | Admitting: Oncology

## 2023-11-08 ENCOUNTER — Encounter: Payer: Self-pay | Admitting: Nurse Practitioner

## 2023-11-08 ENCOUNTER — Telehealth: Payer: Self-pay | Admitting: Oncology

## 2023-11-08 ENCOUNTER — Inpatient Hospital Stay

## 2023-11-08 ENCOUNTER — Inpatient Hospital Stay (HOSPITAL_BASED_OUTPATIENT_CLINIC_OR_DEPARTMENT_OTHER): Admitting: Nurse Practitioner

## 2023-11-08 ENCOUNTER — Other Ambulatory Visit: Payer: Self-pay | Admitting: Radiology

## 2023-11-08 ENCOUNTER — Ambulatory Visit: Payer: Self-pay | Admitting: Oncology

## 2023-11-08 ENCOUNTER — Telehealth: Payer: Self-pay | Admitting: *Deleted

## 2023-11-08 ENCOUNTER — Ambulatory Visit

## 2023-11-08 VITALS — BP 126/64 | HR 83 | Temp 97.3°F | Resp 16 | Wt 181.0 lb

## 2023-11-08 DIAGNOSIS — C519 Malignant neoplasm of vulva, unspecified: Secondary | ICD-10-CM | POA: Diagnosis present

## 2023-11-08 DIAGNOSIS — Z79899 Other long term (current) drug therapy: Secondary | ICD-10-CM | POA: Insufficient documentation

## 2023-11-08 DIAGNOSIS — J302 Other seasonal allergic rhinitis: Secondary | ICD-10-CM | POA: Insufficient documentation

## 2023-11-08 DIAGNOSIS — C518 Malignant neoplasm of overlapping sites of vulva: Secondary | ICD-10-CM | POA: Insufficient documentation

## 2023-11-08 DIAGNOSIS — E039 Hypothyroidism, unspecified: Secondary | ICD-10-CM | POA: Diagnosis not present

## 2023-11-08 DIAGNOSIS — G893 Neoplasm related pain (acute) (chronic): Secondary | ICD-10-CM | POA: Insufficient documentation

## 2023-11-08 DIAGNOSIS — K5900 Constipation, unspecified: Secondary | ICD-10-CM | POA: Insufficient documentation

## 2023-11-08 DIAGNOSIS — Z510 Encounter for antineoplastic radiation therapy: Secondary | ICD-10-CM | POA: Diagnosis present

## 2023-11-08 DIAGNOSIS — Z853 Personal history of malignant neoplasm of breast: Secondary | ICD-10-CM | POA: Insufficient documentation

## 2023-11-08 DIAGNOSIS — Z5111 Encounter for antineoplastic chemotherapy: Secondary | ICD-10-CM | POA: Insufficient documentation

## 2023-11-08 DIAGNOSIS — K219 Gastro-esophageal reflux disease without esophagitis: Secondary | ICD-10-CM | POA: Insufficient documentation

## 2023-11-08 DIAGNOSIS — Z79620 Long term (current) use of immunosuppressive biologic: Secondary | ICD-10-CM | POA: Insufficient documentation

## 2023-11-08 DIAGNOSIS — Z7983 Long term (current) use of bisphosphonates: Secondary | ICD-10-CM | POA: Insufficient documentation

## 2023-11-08 DIAGNOSIS — F17210 Nicotine dependence, cigarettes, uncomplicated: Secondary | ICD-10-CM | POA: Diagnosis not present

## 2023-11-08 DIAGNOSIS — R39198 Other difficulties with micturition: Secondary | ICD-10-CM | POA: Diagnosis not present

## 2023-11-08 DIAGNOSIS — E876 Hypokalemia: Secondary | ICD-10-CM

## 2023-11-08 DIAGNOSIS — R945 Abnormal results of liver function studies: Secondary | ICD-10-CM | POA: Insufficient documentation

## 2023-11-08 DIAGNOSIS — Z79811 Long term (current) use of aromatase inhibitors: Secondary | ICD-10-CM | POA: Insufficient documentation

## 2023-11-08 DIAGNOSIS — Z923 Personal history of irradiation: Secondary | ICD-10-CM | POA: Insufficient documentation

## 2023-11-08 DIAGNOSIS — E871 Hypo-osmolality and hyponatremia: Secondary | ICD-10-CM | POA: Insufficient documentation

## 2023-11-08 LAB — BASIC METABOLIC PANEL - CANCER CENTER ONLY
Anion gap: 8 (ref 5–15)
BUN: 15 mg/dL (ref 8–23)
CO2: 26 mmol/L (ref 22–32)
Calcium: 8.6 mg/dL — ABNORMAL LOW (ref 8.9–10.3)
Chloride: 98 mmol/L (ref 98–111)
Creatinine: 1.22 mg/dL — ABNORMAL HIGH (ref 0.44–1.00)
GFR, Estimated: 49 mL/min — ABNORMAL LOW (ref >60)
Glucose, Bld: 157 mg/dL — ABNORMAL HIGH (ref 70–99)
Potassium: 2.9 mmol/L — ABNORMAL LOW (ref 3.5–5.1)
Sodium: 132 mmol/L — ABNORMAL LOW (ref 135–145)

## 2023-11-08 LAB — CBC WITH DIFFERENTIAL (CANCER CENTER ONLY)
Abs Immature Granulocytes: 0.01 K/uL (ref 0.00–0.07)
Basophils Absolute: 0.1 K/uL (ref 0.0–0.1)
Basophils Relative: 1 %
Eosinophils Absolute: 1 K/uL — ABNORMAL HIGH (ref 0.0–0.5)
Eosinophils Relative: 13 %
HCT: 37.9 % (ref 36.0–46.0)
Hemoglobin: 13.1 g/dL (ref 12.0–15.0)
Immature Granulocytes: 0 %
Lymphocytes Relative: 36 %
Lymphs Abs: 2.6 K/uL (ref 0.7–4.0)
MCH: 35.7 pg — ABNORMAL HIGH (ref 26.0–34.0)
MCHC: 34.6 g/dL (ref 30.0–36.0)
MCV: 103.3 fL — ABNORMAL HIGH (ref 80.0–100.0)
Monocytes Absolute: 0.6 K/uL (ref 0.1–1.0)
Monocytes Relative: 8 %
Neutro Abs: 2.9 K/uL (ref 1.7–7.7)
Neutrophils Relative %: 42 %
Platelet Count: 202 K/uL (ref 150–400)
RBC: 3.67 MIL/uL — ABNORMAL LOW (ref 3.87–5.11)
RDW: 11.9 % (ref 11.5–15.5)
WBC Count: 7.1 K/uL (ref 4.0–10.5)
nRBC: 0 % (ref 0.0–0.2)

## 2023-11-08 LAB — MAGNESIUM: Magnesium: 2 mg/dL (ref 1.7–2.4)

## 2023-11-08 MED ORDER — POTASSIUM CHLORIDE 20 MEQ/100ML IV SOLN: 20.0000 meq | Freq: Once | INTRAVENOUS | Status: DC

## 2023-11-08 MED ORDER — SODIUM CHLORIDE 0.9 % IV SOLN
Freq: Once | INTRAVENOUS | Status: DC
  Filled 2023-11-08: qty 250

## 2023-11-08 MED ORDER — SODIUM CHLORIDE 0.9 % IV SOLN
20.0000 meq | Freq: Once | INTRAVENOUS | Status: DC
  Filled 2023-11-08: qty 10

## 2023-11-08 MED ORDER — POTASSIUM CHLORIDE CRYS ER 20 MEQ PO TBCR: 20.0000 meq | EXTENDED_RELEASE_TABLET | Freq: Every day | ORAL | 0 refills | Status: DC

## 2023-11-08 MED ORDER — POTASSIUM CHLORIDE 20 MEQ PO PACK: 20.0000 meq | PACK | Freq: Every day | ORAL | 0 refills | Status: DC

## 2023-11-08 MED ORDER — POTASSIUM CHLORIDE CRYS ER 20 MEQ PO TBCR: 20.0000 meq | EXTENDED_RELEASE_TABLET | Freq: Once | ORAL | 0 refills | Status: DC

## 2023-11-08 NOTE — Telephone Encounter (Signed)
I will call her this evening.

## 2023-11-08 NOTE — Telephone Encounter (Signed)
 Spoke with Dawn at [facility]; confirmed new script received for the 20meq tablet one daily for 14 days.

## 2023-11-08 NOTE — Progress Notes (Signed)
 Per Dr. Melanee hold Cisplatin treatment until 8/11 when patient starts radiation. Patient potassium is 2.9 today. Tinnie Dawn, NP ordered IV potassium and IV hydration. Attempted 4 times to get IV access but all was unsuccessful. Per Tinnie, she will order oral potassium for patient to take at home. Patient aware and verbalized understanding .  Due to patient being a difficult stick, she has now agreed to get a port placed. Lauren, NP and team aware and they are working on getting the port placement scheduled.

## 2023-11-08 NOTE — Telephone Encounter (Signed)
 Provider prescribed potassium packets.  Pharmacy does not have packets.  Is it ok to fill with tablets.  Please call pharmacy.

## 2023-11-08 NOTE — Addendum Note (Signed)
 Addended by: Shamicka Inga on: 11/08/2023 03:33 PM   Modules accepted: Orders

## 2023-11-08 NOTE — Progress Notes (Signed)
 Hematology/Oncology Consult Note [facility]  [phone] [phone]  Patient Care Team: Entzminger, Ethridge LABOR, MD as PCP - General (Internal Medicine) [name] Shasta POUR, RN as Oncology Nurse Navigator Maurie Rayfield BIRCH, RN as Oncology Nurse Navigator [name], MD as Consulting Physician (Oncology) Lenn Aran, MD as Consulting Physician (Radiation Oncology)   Name of the patient: Patricia Good  [id number]  Jul 13, 1960   Date of visit: 11/08/23  Diagnosis- 1. H/o breast cancer 2. New diagnosis of vulvar cancer  Chief complaint/ Reason for visit- discuss management of vulvar carcinoma  Heme/Onc history: Patient is a 63 year old female with history of stage I left breast cancer ER/PR positive HER2 negative.  She underwent lumpectomy and sentinel lymph node biopsy.  She declined adjuvant chemotherapy despite her recurrence score of 28.  She also stopped radiation therapy prematurely.She is presently on letrozole  for endocrine therapy.  She has baseline osteoporosis for which she is on Fosamax.  She was seen by infectious disease for herpetic like lesions but overall lesions were concerning for malignancy and patient was seen by Dr. Lovetta April 2025.  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   PET CT scan on 09/10/2023 showed: 1. Superficial right vulvar lesion measuring up to 3.3 cm in length with maximum SUV 13.6, compatible with malignancy. 2. Small amount of accentuated activity along the right upper medial thigh just below the crease with the pubis, with associated soft tissue density measuring about 0.7 by 1.7 cm, with maximum SUV 7.7, concerning for malignancy. 3. Right inguinal and right external iliac  lymph nodes with maximum above blood pool, suspicious for metastatic disease.  Interval history-patient returns to clinic for follow up. She starts radiation next week. Is nervous about starting chemotherapy. Worried about how she'll feel. She has picked up her prescriptions but questions how to take them. She has underlying symptoms of anxiety and depression for which she follows up with psychiatry.  ECOG PS- 2 Pain scale- 3  Review of systems- Review of Systems  Constitutional:  Negative for chills, fever, malaise/fatigue and weight loss.  HENT:  Negative for congestion, ear discharge and nosebleeds.   Eyes:  Negative for blurred vision.  Respiratory:  Negative for cough, hemoptysis, sputum production, shortness of breath and wheezing.   Cardiovascular:  Negative for chest pain, palpitations, orthopnea and claudication.  Gastrointestinal:  Negative for abdominal pain, blood in stool, constipation, diarrhea, heartburn, melena, nausea and vomiting.  Genitourinary:  Negative for dysuria, flank pain, frequency, hematuria and urgency.  Musculoskeletal:  Negative for back pain, joint pain and myalgias.  Skin:  Negative for rash.  Neurological:  Negative for dizziness, tingling, focal weakness, seizures, weakness and headaches.  Endo/Heme/Allergies:  Does not bruise/bleed easily.  Psychiatric/Behavioral:  Negative for depression and suicidal ideas. The patient is nervous/anxious. The patient does not have insomnia.     Allergies  Allergen Reactions   Shellfish Allergy Anaphylaxis    Only to crab meat (non-imitation), not shrimp   Sulfa Antibiotics Hives, Anaphylaxis and Other (See Comments)   Sulfasalazine Anaphylaxis and Hives    Other reaction(s): Other (See Comments)   Lisinopril     unknown   Past Medical History:  Diagnosis Date   Abnormal thyroid  stimulating hormone (TSH) level    Anemia    Anxiety    Asthma  Bipolar 1 disorder (HCC)    COPD (chronic obstructive pulmonary  disease) (HCC)    Endometriosis    GERD (gastroesophageal reflux disease)    Hepatitis A    Hypertension    Hypothyroidism    Malignant neoplasm of upper-inner quadrant of left breast in female, 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   Personal history of radiation therapy    Pre-diabetes    PTSD (post-traumatic stress disorder)    Schizoaffective disorder [facility])    Past Surgical History:  Procedure Laterality Date   BREAST BIOPSY Left 03/03/2022   Left breast stereo bx Ribbon Clip- path pending   BREAST BIOPSY Left 03/03/2022   MM LT BREAST BX W LOC DEV 1ST LESION IMAGE BX SPEC STEREO GUIDE 03/03/2022 [facility]   BREAST LUMPECTOMY Left 03/20/2022   ECTOPIC PREGNANCY SURGERY     PART MASTECTOMY,RADIO FREQUENCY LOCALIZER,AXILLARY SENTINEL NODE BIOPSY Left 03/20/2022   Procedure: PART MASTECTOMY,RADIO FREQUENCY LOCALIZER,AXILLARY SENTINEL NODE BIOPSY;  Surgeon: Rodolph Romano, MD;  Location: [facility] ORS;  Service: General;  Laterality: Left;   TOTAL ABDOMINAL HYSTERECTOMY W/ BILATERAL SALPINGOOPHORECTOMY  1990   Social History   Socioeconomic History   Marital status: Single    Spouse name: Not on file   Number of children: 0   Years of education: Not on file   Highest education level: Associate degree: occupational, Scientist, product/process development, or vocational program  Occupational History   Not on file  Tobacco Use   Smoking status: Every Day    Current packs/day: 0.50    Average packs/day: 0.5 packs/day for 54.8 years (27.4 ttl pk-yrs)    Types: Cigarettes    Start date: 01/22/1969   Smokeless tobacco: Never   Tobacco comments:    Continues to go down on number per day to 5-7 per day.   Vaping Use   Vaping status: Never Used  Substance and Sexual Activity   Alcohol use: Not Currently    Alcohol/week: 1.0 - 2.0 standard drink of alcohol    Types: 1 Glasses of wine per week    Comment: occasional   Drug use: No   Sexual activity: Not Currently    Birth control/protection:  None  Other Topics Concern   Not on file  Social History Narrative   Lives alone   Social Drivers of Health   Financial Resource Strain: Medium Risk (08/03/2023)   Received from [facility]   Overall Financial Resource Strain (CARDIA)    Difficulty of Paying Living Expenses: Somewhat hard  Food Insecurity: No Food Insecurity (08/19/2023)   Hunger Vital Sign    Worried About Running Out of Food in the Last Year: Never true    Ran Out of Food in the Last Year: Never true  Transportation Needs: No Transportation Needs (08/19/2023)   PRAPARE - Administrator, Civil Service (Medical): No    Lack of Transportation (Non-Medical): No  Physical Activity: Inactive (06/15/2017)   Exercise Vital Sign    Days of Exercise per Week: 0 days    Minutes of Exercise per Session: 0 min  Stress: Stress Concern Present (06/15/2017)   [employer] of Occupational Health - Occupational Stress Questionnaire    Feeling of Stress : Very much  Social Connections: Socially Isolated (06/15/2017)   Social Connection and Isolation Panel    Frequency of Communication with Friends and Family: Never    Frequency of Social Gatherings with Friends and Family: Never    Attends Religious Services: Never    Active Member  of Clubs or Organizations: No    Attends Banker Meetings: Never    Marital Status: Divorced  Catering manager Violence: Not At Risk (08/19/2023)   Humiliation, Afraid, Rape, and Kick questionnaire    Fear of Current or Ex-Partner: No    Emotionally Abused: No    Physically Abused: No    Sexually Abused: No   Family History  Problem Relation Age of Onset   Hypertension Father    Diabetes Father    Heart disease Father    Blindness Father    Alcohol abuse Sister    Drug abuse Sister    Anxiety disorder Sister    Depression Sister    Obesity Brother    Arthritis Brother    Alcohol abuse Brother    Drug abuse Brother    Depression Brother     Breast cancer Cousin    Clotting disorder Neg Hx     Current Outpatient Medications:    alendronate (FOSAMAX) 70 MG tablet, Take 70 mg by mouth once a week., Disp: , Rfl:    atorvastatin (LIPITOR) 20 MG tablet, Take 20 mg by mouth at bedtime., Disp: , Rfl:    buPROPion  (WELLBUTRIN  XL) 150 MG 24 hr tablet, TAKE 1 TABLET BY MOUTH DAILY ALONG WITH 300 MG TABLET FOR TOTAL OF 450 MG DAILY, Disp: 90 tablet, Rfl: 1   buPROPion  (WELLBUTRIN  XL) 300 MG 24 hr tablet, Take 1 tablet (300 mg total) by mouth daily. Take along with 150 mg daily , total of 450 mg daily, Disp: 90 tablet, Rfl: 1   Calcium  Carb-Cholecalciferol 500-10 MG-MCG TABS, Take 1 tablet by mouth., Disp: , Rfl:    calcium -vitamin D  (OSCAL WITH D) 500-200 MG-UNIT tablet, Take 1 tablet by mouth daily with breakfast., Disp: , Rfl:    cephALEXin  (KEFLEX ) 500 MG capsule, Take 1 capsule (500 mg total) by mouth 3 (three) times daily., Disp: 30 capsule, Rfl: 0   clonazePAM  (KLONOPIN ) 0.5 MG tablet, Take 1 tablet (0.5 mg total) by mouth daily as needed for anxiety. Please use it only for severe panic attacks, do not combine with Oxycodone , Disp: 15 tablet, Rfl: 0   dexamethasone  (DECADRON ) 4 MG tablet, Take 2 tablets daily x 3 days starting the day after chemotherapy. Take with food., Disp: 30 tablet, Rfl: 1   fluticasone  (FLONASE ) 50 MCG/ACT nasal spray, Place into both nostrils as needed for allergies or rhinitis., Disp: , Rfl:    furosemide  (LASIX ) 20 MG tablet, Take 20 mg by mouth daily. , Disp: , Rfl:    hydrOXYzine  (ATARAX ) 10 MG tablet, Take 2 tablets (20 mg total) by mouth at bedtime as needed. for sleep and anxiety, Disp: 180 tablet, Rfl: 0   letrozole  (FEMARA ) 2.5 MG tablet, Take 1 tablet (2.5 mg total) by mouth daily., Disp: 30 tablet, Rfl: 3   levothyroxine  (SYNTHROID , LEVOTHROID) 112 MCG tablet, , Disp: , Rfl:    OLANZapine  (ZYPREXA ) 5 MG tablet, Take 1 tablet (5 mg total) by mouth at bedtime., Disp: 90 tablet, Rfl: 3   omeprazole  (PRILOSEC) 40 MG capsule, Take 40 mg by mouth daily. , Disp: , Rfl:    ondansetron  (ZOFRAN ) 8 MG tablet, Take 1 tablet (8 mg total) by mouth every 8hrs as needed for nausea or vomiting. Start on the third day after cisplatin., Disp: 30 tablet, Rfl: 1   oxyCODONE  (OXY IR/ROXICODONE ) 5 MG immediate release tablet, Take 0.5-1 tablets (2.5-5 mg total) by mouth every 8 (eight) hours as needed for  severe pain (pain score 7-10) (for pain unrelieved by tylenol )., Disp: 30 tablet, Rfl: 0   prazosin  (MINIPRESS ) 2 MG capsule, TAKE ONE CAPSULE BY MOUTH AT BEDTIME., Disp: 90 capsule, Rfl: 1   prochlorperazine  (COMPAZINE ) 10 MG tablet, Take 1 tablet (10 mg total) by mouth every 6 (six) hours as needed for nausea or vomiting., Disp: 30 tablet, Rfl: 1  Physical exam:  Vitals:   11/08/23 0846  BP: 126/64  Pulse: 83  Resp: 16  Temp: (!) 97.3 F (36.3 C)  TempSrc: Tympanic  SpO2: 97%  Weight: 181 lb (82.1 kg)   Physical Exam Constitutional:      General: She is not in acute distress.    Appearance: She is well-developed. She is not ill-appearing.  HENT:     Head: Atraumatic.  Cardiovascular:     Rate and Rhythm: Normal rate and regular rhythm.  Pulmonary:     Effort: Pulmonary effort is normal.     Breath sounds: Normal breath sounds.  Abdominal:     General: There is no distension.     Palpations: Abdomen is soft.     Tenderness: There is no abdominal tenderness.  Musculoskeletal:        General: No tenderness.  Skin:    General: Skin is warm and dry.     Coloration: Skin is not pale.  Neurological:     General: No focal deficit present.     Mental Status: She is alert and oriented to person, place, and time.  Psychiatric:        Mood and Affect: Mood normal.        Behavior: Behavior normal.     I have personally reviewed labs listed below:    Latest Ref Rng & Units 11/08/2023    8:36 AM  CMP  Glucose 70 - 99 mg/dL 842   BUN 8 - 23 mg/dL 15   Creatinine 9.55 - 1.00 mg/dL 8.77    Sodium 864 - 854 mmol/L 132   Potassium 3.5 - 5.1 mmol/L 2.9   Chloride 98 - 111 mmol/L 98   CO2 22 - 32 mmol/L 26   Calcium  8.9 - 10.3 mg/dL 8.6       Latest Ref Rng & Units 11/08/2023    8:36 AM  CBC  WBC 4.0 - 10.5 K/uL 7.1   Hemoglobin 12.0 - 15.0 g/dL 86.8   Hematocrit 63.9 - 46.0 % 37.9   Platelets 150 - 400 K/uL 202    Assessment and plan- Patient is a 63 y.o. female with prior history of left breast cancer stage I now with newly diagnosed squamous cell carcinoma of the vulva.  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 Evidence of local regional lymph node involvement on imaging as well. Not a candidate for surgery due to extent of her disease. Concurrent chemo-radiation with weekly cisplatin was recommended. Today we again discussed potential risks and benefits including but not limited to nausea, vomiting, low blood counts, risk of peripheral neuropathy hair loss, kidney injury and hearing loss as well as risk of infection and hospitalization. She has completed chemo education class. She declined port for now. She has obtained her supportive meds which we reviewed in detail today including how to take zofran , compazine , and dexamethasone . Patient understands and agrees to proceed. However, she is not planned to start radiation until next week, therefore, we will just plan for electrolyte replacement today  and she will see Dr Melanee next week for first treatment.  CKD- baseline CKD with GFR between 40-50. Will need to monitor closely while she is on cisplatin.  Hypokalemia- K 2.9. Plan for 20 meq KCl IV today along with her IVF. Recommend taking 20 meq orally daily.  Pain due to malignancy- controlled with oxycodone . Using minimal doses.   Addendum:  Unable to gain IV access today. Patient requests port-a-cath. Will plan to place. Hold off on IVF and IV KCl today. Will send oral prescriptions.    Disposition: IVF and KCl today Rtc in 1 week for consideration of cycle 1 of cisplatin- la   Cancer Staging  Malignant neoplasm of upper-inner quadrant of left breast in female, estrogen receptor positive (HCC) Staging form: Breast, AJCC 8th Edition - Clinical stage from 03/10/2022: Stage IA (cT1a, cN0, cM0, G2, ER+, PR+, HER2-) - Signed by [name], MD on 03/10/2022 Histologic grading system: 3 grade system - Pathologic stage from 04/07/2022: Stage IA (pT1a, pN0, cM0, G3, ER+, PR+, HER2-, Oncotype DX score: 28) - Signed by [name], MD on 04/13/2022 Stage prefix: Initial diagnosis Multigene prognostic tests performed: Oncotype DX Recurrence score range: Greater than or equal to 11 Histologic grading system: 3 grade system  Vulvar cancer (HCC) Staging form: Vulva, AJCC V9 - Clinical stage from 10/11/2023: FIGO Stage IIIB (cT2, cN1b, cM0) - Signed by [name], MD on 10/11/2023 Stage prefix: Initial diagnosis   Visit Diagnosis 1. Vulvar cancer (HCC)   2. Hypokalemia    Tinnie Dawn, DNP, AGNP-C, [facility] [phone] (clinic) 11/08/2023

## 2023-11-08 NOTE — Telephone Encounter (Signed)
 Tablets only option if patient will take it unless some other pharmacy carries packets and insurance approves it

## 2023-11-08 NOTE — Addendum Note (Signed)
 Addended by: Tocara Mennen G on: 11/08/2023 01:50 PM   Modules accepted: Orders

## 2023-11-08 NOTE — Telephone Encounter (Addendum)
 Patient would like port placed; per Clarita I can put her on Thurs 8/7 at 11a an arrive at 10a. Let me know. Thanks.  Outbound call to patient; voice message left.

## 2023-11-08 NOTE — Telephone Encounter (Signed)
 Patient called to see what her next appointments were to be. She states she was so upset when she left today that she couldn't stop by the desk. She is asking for Dr. Melanee to give her a call.   2 different nurses tried and couldn't get IV started also, someone mentioned a PORT. She really don't want a PORT. She may have to transfer her care to [facility].   Not sure who needs this message.  Thank you

## 2023-11-09 ENCOUNTER — Ambulatory Visit

## 2023-11-09 ENCOUNTER — Telehealth: Payer: Self-pay | Admitting: Pharmacy Technician

## 2023-11-09 ENCOUNTER — Encounter: Payer: Self-pay | Admitting: Oncology

## 2023-11-09 ENCOUNTER — Telehealth: Payer: Self-pay | Admitting: Oncology

## 2023-11-09 NOTE — Telephone Encounter (Signed)
 Pt called again and stated she did not get any messages/call back. I told her about the IR appt that is on her appt desk for 8/7 and confirmed time and location with pt

## 2023-11-09 NOTE — Telephone Encounter (Signed)
 Provided patient information regarding the [employer] fund.  Patient verbally acknowledged that she understood.  To connect with patient on 11/15/23 when patient presents for appointment.  Patient is to bring an invoice from landlord to the appointment requesting reimbursement.  E-mailed W-9 to patient to have landlord complete and return.  Dickey DOROTHA Fritter Patient Services Navigator [facility]

## 2023-11-09 NOTE — Telephone Encounter (Signed)
 Outbound call to patient, informed of below.  Reminded NPO at least 8 hours prior to procedure and to bring a driver since moderate sedation is involved.  Patient verbalized understanding; reminded she will receive a call tomorrow to review the above details.

## 2023-11-09 NOTE — Telephone Encounter (Signed)
 Pt left a vm and stated she wanted to speak with MD

## 2023-11-10 ENCOUNTER — Telehealth: Payer: Self-pay | Admitting: Oncology

## 2023-11-10 ENCOUNTER — Ambulatory Visit

## 2023-11-10 DIAGNOSIS — Z510 Encounter for antineoplastic radiation therapy: Secondary | ICD-10-CM | POA: Diagnosis not present

## 2023-11-10 DIAGNOSIS — F17210 Nicotine dependence, cigarettes, uncomplicated: Secondary | ICD-10-CM | POA: Insufficient documentation

## 2023-11-10 DIAGNOSIS — Z853 Personal history of malignant neoplasm of breast: Secondary | ICD-10-CM | POA: Insufficient documentation

## 2023-11-10 DIAGNOSIS — Z923 Personal history of irradiation: Secondary | ICD-10-CM | POA: Insufficient documentation

## 2023-11-10 DIAGNOSIS — C519 Malignant neoplasm of vulva, unspecified: Secondary | ICD-10-CM | POA: Insufficient documentation

## 2023-11-10 NOTE — Telephone Encounter (Signed)
 Patient Called and asked me if we could change her MRN number. She said that some people that she doesn't want to have it, have gotten access to her MRN number and she wants it changed. She also stated that once it is changed she would like for us  to not tell her the new MRN. I told her I would let the team know.   Side note: I have never experienced a request like this so I was not sure that this it even possible.

## 2023-11-10 NOTE — Progress Notes (Signed)
 Patient for IR Port Placement on Thurs 11/11/23, I called and spoke with the patient on the phone and gave pre-procedure instructions. Pt was made aware to be here at 10a, NPO after MN prior to procedure as well as driver post procedure/recovery/discharge. Pt stated understanding.  Called 11/10/23

## 2023-11-10 NOTE — H&P (Signed)
 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 female with a history of herpes and stage I left breast cancer ER/PR positive HER2 negative s/p lumpectomy and radiation therapy. Patient reportedly declined adjuvant chemotherapy and terminated her radiation therapy prematurely. She is currently on letrozole  for endocrine therapy. She was seen by infectious disease in April of this year due to concerns for herpes not responding to medication; however, after evaluation, lesions were most concerning for malignancy. She was referred to Dr. 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 PET scan on 6/6 with right inguinal and right external iliac lymph nodes with maximum above blood pool concerning for metastatic disease. Per GYN-Onc patient is not a surgical candidate at this time, but patient is agreeable to move forward with concurrent chemoradiation. Referred to IR for port-a-catheter placement.   Patient is resting in bed in no acute distress. States that she is set to start chemotherapy next Monday. Admits to local pain associated with the vulvar lesions as well as dysuria with lesions. Denies any chest pain, shortness of breath, nausea/vomiting, fevers/chills, abdominal pain, pelvic pain, or changes in bowel habits. She does report having 2 small cups of coffee with cream and sugar this morning before 5am. All questions and concerns answered at the bedside.  Code Status: Full Code  Past Medical History:  Diagnosis Date   Abnormal thyroid  stimulating hormone (TSH) level    Anemia    Anxiety    Asthma    Bipolar 1 disorder (HCC)    COPD (chronic obstructive pulmonary disease) (HCC)    Endometriosis    GERD (gastroesophageal  reflux disease)    Hepatitis A    Hypertension    Hypothyroidism    Malignant neoplasm of upper-inner quadrant of left breast in female, 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   Personal history of radiation therapy    Pre-diabetes    PTSD (post-traumatic stress disorder)    Schizoaffective disorder (HCC)     Past Surgical History:  Procedure Laterality Date   BREAST BIOPSY Left 03/03/2022   Left breast stereo bx Ribbon Clip- path pending   BREAST BIOPSY Left 03/03/2022   MM LT BREAST BX W LOC DEV 1ST LESION IMAGE BX SPEC STEREO GUIDE 03/03/2022 [facility]   BREAST LUMPECTOMY Left 03/20/2022   ECTOPIC PREGNANCY SURGERY     PART MASTECTOMY,RADIO FREQUENCY LOCALIZER,AXILLARY SENTINEL NODE BIOPSY Left 03/20/2022   Procedure: PART MASTECTOMY,RADIO FREQUENCY LOCALIZER,AXILLARY SENTINEL NODE BIOPSY;  Surgeon: Rodolph Romano, MD;  Location: [facility] ORS;  Service: General;  Laterality: Left;   TOTAL ABDOMINAL HYSTERECTOMY W/ BILATERAL SALPINGOOPHORECTOMY  1990    Allergies: Shellfish allergy, Sulfasalazine, and Lisinopril  Medications: Prior to Admission medications   Medication Sig Start Date End Date Taking? Authorizing Provider  alendronate (FOSAMAX) 70 MG tablet Take 70 mg by mouth once a week. 02/18/21   [provider]  atorvastatin (LIPITOR) 20 MG tablet Take 20 mg by mouth at bedtime. 04/23/20   [provider]  buPROPion  (WELLBUTRIN  XL) 150 MG 24 hr tablet TAKE 1 TABLET BY MOUTH DAILY ALONG WITH 300 MG TABLET FOR TOTAL OF 450 MG DAILY 09/12/23   Eappen, Saramma, MD  buPROPion  (WELLBUTRIN  XL) 300 MG 24 hr tablet Take 1 tablet (300 mg total) by mouth daily. Take along  with 150 mg daily , total of 450 mg daily 09/28/23   Eappen, Saramma, MD  Calcium  Carb-Cholecalciferol 500-10 MG-MCG TABS Take 1 tablet by mouth.    [provider]  calcium -vitamin D  (OSCAL WITH D) 500-200 MG-UNIT tablet Take 1 tablet by mouth daily with breakfast.    [provider]  clonazePAM  (KLONOPIN ) 0.5 MG tablet Take 1 tablet (0.5 mg total) by mouth daily as needed for anxiety. Please use it only for severe panic attacks, do not combine with Oxycodone  11/05/23 12/05/23  Eappen, Saramma, MD  dexamethasone  (DECADRON ) 4 MG tablet Take 2 tablets daily x 3 days starting the day after chemotherapy. Take with food. 10/11/23   [name], MD  fluticasone  (FLONASE ) 50 MCG/ACT nasal spray Place into both nostrils as needed for allergies or rhinitis.    [provider]  hydrOXYzine  (ATARAX ) 10 MG tablet Take 2 tablets (20 mg total) by mouth at bedtime as needed. for sleep and anxiety 08/12/23   Eappen, Saramma, MD  letrozole  (FEMARA ) 2.5 MG tablet Take 1 tablet (2.5 mg total) by mouth daily. 04/27/23   [name], MD  levothyroxine  (SYNTHROID , LEVOTHROID) 112 MCG tablet  05/04/18   [provider]  OLANZapine  (ZYPREXA ) 5 MG tablet Take 1 tablet (5 mg total) by mouth at bedtime. 03/24/23   Eappen, Saramma, MD  omeprazole (PRILOSEC) 40 MG capsule Take 40 mg by mouth daily.     [provider]  ondansetron  (ZOFRAN ) 8 MG tablet Take 1 tablet (8 mg total) by mouth every 8hrs as needed for nausea or vomiting. Start on the third day after cisplatin. 10/11/23   [name], MD  oxyCODONE  (OXY IR/ROXICODONE ) 5 MG immediate release tablet Take 0.5-1 tablets (2.5-5 mg total) by mouth every 8 (eight) hours as needed for severe pain (pain score 7-10) (for pain unrelieved by tylenol ). 10/06/23   Dasie Tinnie MATSU, NP  potassium chloride  SA (KLOR-CON  M) 20 MEQ tablet Take 1 tablet (20 mEq total) by mouth daily. For low potassium 11/08/23   Dasie Tinnie MATSU, NP  prazosin  (MINIPRESS ) 2 MG capsule TAKE ONE CAPSULE BY MOUTH AT BEDTIME. 07/26/23   Okey Barnie SAUNDERS, MD  prochlorperazine  (COMPAZINE ) 10 MG tablet Take 1 tablet (10 mg total) by mouth every 6 (six) hours as needed for nausea or vomiting. 10/11/23   [name], MD     Family History  Problem Relation Age  of Onset   Hypertension Father    Diabetes Father    Heart disease Father    Blindness Father    Alcohol abuse Sister    Drug abuse Sister    Anxiety disorder Sister    Depression Sister    Obesity Brother    Arthritis Brother    Alcohol abuse Brother    Drug abuse Brother    Depression Brother    Breast cancer Cousin    Clotting disorder Neg Hx     Social History   Socioeconomic History   Marital status: Single    Spouse name: Not on file   Number of children: 0   Years of education: Not on file   Highest education level: Associate degree: occupational, Scientist, product/process development, or vocational program  Occupational History   Not on file  Tobacco Use   Smoking status: Every Day    Current packs/day: 0.50    Average packs/day: 0.5 packs/day for 54.8 years (27.4 ttl pk-yrs)    Types: Cigarettes    Start date: 01/22/1969  Smokeless tobacco: Never   Tobacco comments:    Continues to go down on number per day to 5-7 per day.   Vaping Use   Vaping status: Never Used  Substance and Sexual Activity   Alcohol use: Not Currently    Alcohol/week: 1.0 - 2.0 standard drink of alcohol    Types: 1 Glasses of wine per week    Comment: occasional   Drug use: No   Sexual activity: Not Currently    Birth control/protection: None  Other Topics Concern   Not on file  Social History Narrative   Lives alone   Social Drivers of Health   Financial Resource Strain: Medium Risk (08/03/2023)   Received from [facility]   Overall Financial Resource Strain (CARDIA)    Difficulty of Paying Living Expenses: Somewhat hard  Food Insecurity: No Food Insecurity (08/19/2023)   Hunger Vital Sign    Worried About Running Out of Food in the Last Year: Never true    Ran Out of Food in the Last Year: Never true  Transportation Needs: No Transportation Needs (08/19/2023)   PRAPARE - Administrator, Civil Service (Medical): No    Lack of Transportation (Non-Medical): No  Physical  Activity: Inactive (06/15/2017)   Exercise Vital Sign    Days of Exercise per Week: 0 days    Minutes of Exercise per Session: 0 min  Stress: Stress Concern Present (06/15/2017)   [employer] of Occupational Health - Occupational Stress Questionnaire    Feeling of Stress : Very much  Social Connections: Socially Isolated (06/15/2017)   Social Connection and Isolation Panel    Frequency of Communication with Friends and Family: Never    Frequency of Social Gatherings with Friends and Family: Never    Attends Religious Services: Never    Database administrator or Organizations: No    Attends Engineer, structural: Never    Marital Status: Divorced    Review of Systems  Genitourinary:  Positive for dysuria (due to vulvar lesions) and vaginal pain.   Denies any N/V, chest pain, shortness of breath, fevers/chills. All other ROS negative.  Vital Signs: There were no vitals taken for this visit.   Physical Exam Vitals reviewed.  Constitutional:      Appearance: Normal appearance.  HENT:     Head: Normocephalic and atraumatic.     Mouth/Throat:     Mouth: Mucous membranes are moist.     Pharynx: Oropharynx is clear.  Cardiovascular:     Rate and Rhythm: Normal rate and regular rhythm.     Heart sounds: Normal heart sounds.  Pulmonary:     Effort: Pulmonary effort is normal.     Breath sounds: Normal breath sounds.  Abdominal:     General: Abdomen is flat.     Palpations: Abdomen is soft.     Tenderness: There is no abdominal tenderness.  Skin:    General: Skin is warm and dry.  Neurological:     General: No focal deficit present.     Mental Status: She is alert and oriented to person, place, and time. Mental status is at baseline.  Psychiatric:        Mood and Affect: Mood normal.        Behavior: Behavior normal.        Judgment: Judgment normal.     Imaging: No results found.  Labs:  CBC: Recent Labs    09/15/23 1203 11/08/23 0836  WBC 8.4  7.1   HGB 12.7 13.1  HCT 37.0 37.9  PLT 221 202    COAGS: No results for input(s): INR, APTT in the last 8760 hours.  BMP: Recent Labs    09/15/23 1203 11/08/23 0836  NA 133* 132*  K 4.2 2.9*  CL 97* 98  CO2 29 26  GLUCOSE 92 157*  BUN 13 15  CALCIUM  8.6* 8.6*  CREATININE 1.20* 1.22*  GFRNONAA 50* 49*    LIVER FUNCTION TESTS: Recent Labs    09/15/23 1203  BILITOT 0.6  AST 57*  ALT 57*  ALKPHOS 227*  PROT 7.5  ALBUMIN 3.8    TUMOR MARKERS: No results for input(s): AFPTM, CEA, CA199, CHROMGRNA in the last 8760 hours.  Assessment and Plan:  Vulvar Carcinoma: Patricia Good is a 63 y.o. female with a history of stage I left breast cancer s/p lumpectomy who was recently diagnoses with vulvar cancer. She presents to [facility] Interventional Radiology department for an image-guided port-a-catheter insertion with Dr. Hughes. Procedure to be performed under moderate sedation.  -2 cups of coffee prior to 5am; 6hrs at 11am at the time of procedure -Plan for port-a-catheter placement with Dr. JINNY Hughes  Risks and benefits of image guided port-a-catheter placement was discussed with the patient including, but not limited to bleeding, infection, pneumothorax, or fibrin sheath development and need for additional procedures.  All of the patient's questions were answered, patient is agreeable to proceed. Consent signed and in chart.   Thank you for this interesting consult. I greatly enjoyed meeting Patricia Good and look forward to participating in their care. A copy of this report was sent to the requesting provider on this date.  Electronically Signed: Manroop Jakubowicz M Zyaire Dumas, PA-C 11/10/2023, 3:22 PM   I spent a total of  15 Minutes in face to face clinical consultation, greater than 50% of which was counseling/coordinating care for port-a-catheter insertion.

## 2023-11-11 ENCOUNTER — Ambulatory Visit

## 2023-11-11 ENCOUNTER — Telehealth: Payer: Self-pay | Admitting: *Deleted

## 2023-11-11 ENCOUNTER — Other Ambulatory Visit: Payer: Self-pay

## 2023-11-11 ENCOUNTER — Encounter: Payer: Self-pay | Admitting: Radiology

## 2023-11-11 ENCOUNTER — Ambulatory Visit
Admission: RE | Admit: 2023-11-11 | Discharge: 2023-11-11 | Disposition: A | Discharge status: Home / Self Care | Source: Ambulatory Visit | Attending: Oncology | Admitting: Oncology

## 2023-11-11 DIAGNOSIS — Z923 Personal history of irradiation: Secondary | ICD-10-CM | POA: Insufficient documentation

## 2023-11-11 DIAGNOSIS — Z1721 Progesterone receptor positive status: Secondary | ICD-10-CM | POA: Diagnosis not present

## 2023-11-11 DIAGNOSIS — C519 Malignant neoplasm of vulva, unspecified: Secondary | ICD-10-CM | POA: Insufficient documentation

## 2023-11-11 DIAGNOSIS — Z170 Estrogen receptor positive status [ER+]: Secondary | ICD-10-CM | POA: Diagnosis not present

## 2023-11-11 DIAGNOSIS — F17210 Nicotine dependence, cigarettes, uncomplicated: Secondary | ICD-10-CM | POA: Insufficient documentation

## 2023-11-11 DIAGNOSIS — Z79811 Long term (current) use of aromatase inhibitors: Secondary | ICD-10-CM | POA: Insufficient documentation

## 2023-11-11 DIAGNOSIS — Z853 Personal history of malignant neoplasm of breast: Secondary | ICD-10-CM | POA: Insufficient documentation

## 2023-11-11 HISTORY — PX: IR IMAGING GUIDED PORT INSERTION: IMG5740

## 2023-11-11 MED ORDER — FENTANYL CITRATE (PF) 100 MCG/2ML IJ SOLN
INTRAMUSCULAR | Status: AC | PRN
  Administered 2023-11-11 (×2): 50 ug via INTRAVENOUS

## 2023-11-11 MED ORDER — HEPARIN SOD (PORK) LOCK FLUSH 100 UNIT/ML IV SOLN
500.0000 [IU] | Freq: Once | INTRAVENOUS | Status: AC
  Administered 2023-11-11: 500 [IU] via INTRAVENOUS

## 2023-11-11 MED ORDER — SODIUM CHLORIDE 0.9 % IV SOLN: INTRAVENOUS | Status: DC

## 2023-11-11 MED ORDER — MIDAZOLAM HCL 2 MG/2ML IJ SOLN
INTRAMUSCULAR | Status: AC | PRN
Start: 2023-11-11 — End: 2023-11-11
  Administered 2023-11-11 (×2): 1 mg via INTRAVENOUS

## 2023-11-11 MED ORDER — LIDOCAINE-EPINEPHRINE 1 %-1:100000 IJ SOLN
INTRAMUSCULAR | Status: AC
  Filled 2023-11-11: qty 1

## 2023-11-11 MED ORDER — MIDAZOLAM HCL 2 MG/2ML IJ SOLN
INTRAMUSCULAR | Status: AC
  Filled 2023-11-11: qty 2

## 2023-11-11 MED ORDER — HEPARIN SOD (PORK) LOCK FLUSH 100 UNIT/ML IV SOLN
INTRAVENOUS | Status: AC
  Filled 2023-11-11: qty 5

## 2023-11-11 MED ORDER — FENTANYL CITRATE (PF) 100 MCG/2ML IJ SOLN
INTRAMUSCULAR | Status: AC
Start: 2023-11-11 — End: 2023-11-11
  Filled 2023-11-11: qty 2

## 2023-11-11 MED ORDER — LIDOCAINE-EPINEPHRINE 1 %-1:100000 IJ SOLN
20.0000 mL | Freq: Once | INTRAMUSCULAR | Status: AC
  Administered 2023-11-11: 20 mL via INTRADERMAL

## 2023-11-11 NOTE — Procedures (Signed)
 Vascular and Interventional Radiology Procedure Note  Patient: Patricia Good DOB: [birth year] Medical Record Number: [id number] Note Date/Time: 11/11/23 11:26 AM   Performing Physician: Thom Hall, MD Assistant(s): None  Diagnosis: GYN CA  Procedure: PORT PLACEMENT  Anesthesia: Conscious Sedation Complications: None Estimated Blood Loss: Minimal  Findings:  Successful right-sided port placement, with the tip of the catheter in the proximal right atrium.  Plan: Catheter ready for use.  See detailed procedure note with images in PACS. The patient tolerated the procedure well without incident or complication and was returned to Recovery in stable condition.    Thom Hall, MD [facility] Radiology   Pager. [phone] Clinic. [phone]

## 2023-11-11 NOTE — Telephone Encounter (Signed)
 Shelton called in and wanted to get that in the message to [facility] [employer] as soon as you fast the weekend she says.  Number is [phone]

## 2023-11-11 NOTE — Progress Notes (Signed)
 Patient clinically stable post IR Port placement per Dr Hughes, tolerated well. Vitals stable pre and post procedure. Received Versed  2 mg along with Fentanyl  100 mcg IV for procedure.  Report given to Laymon Fish RN post procedure/specials/15

## 2023-11-12 ENCOUNTER — Telehealth: Payer: Self-pay | Admitting: Psychiatry

## 2023-11-12 ENCOUNTER — Telehealth: Payer: Self-pay | Admitting: *Deleted

## 2023-11-12 ENCOUNTER — Other Ambulatory Visit: Payer: Self-pay

## 2023-11-12 ENCOUNTER — Telehealth: Payer: Self-pay | Admitting: Oncology

## 2023-11-12 ENCOUNTER — Other Ambulatory Visit: Payer: Self-pay | Admitting: *Deleted

## 2023-11-12 ENCOUNTER — Telehealth: Payer: Self-pay | Admitting: Pharmacy Technician

## 2023-11-12 ENCOUNTER — Ambulatory Visit

## 2023-11-12 DIAGNOSIS — C519 Malignant neoplasm of vulva, unspecified: Secondary | ICD-10-CM

## 2023-11-12 MED ORDER — LIDOCAINE-PRILOCAINE 2.5-2.5 % EX CREA
1.0000 | TOPICAL_CREAM | CUTANEOUS | 2 refills | Status: AC | PRN
Start: 2023-11-12 — End: ?

## 2023-11-12 MED FILL — Fosaprepitant Dimeglumine For IV Infusion 150 MG (Base Eq): INTRAVENOUS | Qty: 5 | Status: AC

## 2023-11-12 NOTE — Telephone Encounter (Signed)
 Dr. Melanee put in the Emla  cream and they will get it ready today.  I called and let him know that it should be ready for today.

## 2023-11-12 NOTE — Telephone Encounter (Signed)
 Kristi can you call?

## 2023-11-12 NOTE — Telephone Encounter (Signed)
 Patient called to report that her port was placed yesterday and she needs her numbing cream today. I'm not sure if she understands what the process for the numbing cream is. If someone could call her to discuss.

## 2023-11-12 NOTE — Telephone Encounter (Signed)
 EMLA  cream sent to pharmacy. Called and educated on use with read back confirmed.

## 2023-11-12 NOTE — Telephone Encounter (Signed)
 Received message from [name] requesting that I speak with patient.  Contacted patient and patient inquiring about using grant funds to cover a deposit and first month's rent.  Patient stated that she has not signed the lease.  Made patient aware that we would need a signed lease agreement in her name and have the supporting financial documents for the grant before we could release funds.  Patient stated that she was going to contact provide the requested proof of income whenever she comes to her appointment on 11/15/23.  She also stated that she would provide a copy of the lease once it is executed.  I also made patient aware that we would need a completed W-9 form from her landlord.  I have e-mailed the W-9 form to patient and she said that she would have her prospective landlord complete and sign it.  Then she will return it along with the lease agreement.  Patricia Good Patient Services Navigator [facility]

## 2023-11-15 ENCOUNTER — Ambulatory Visit

## 2023-11-15 ENCOUNTER — Inpatient Hospital Stay (HOSPITAL_BASED_OUTPATIENT_CLINIC_OR_DEPARTMENT_OTHER): Admitting: Hospice and Palliative Medicine

## 2023-11-15 ENCOUNTER — Inpatient Hospital Stay (HOSPITAL_BASED_OUTPATIENT_CLINIC_OR_DEPARTMENT_OTHER): Admitting: Oncology

## 2023-11-15 ENCOUNTER — Encounter: Payer: Self-pay | Admitting: Oncology

## 2023-11-15 ENCOUNTER — Ambulatory Visit: Admitting: Oncology

## 2023-11-15 ENCOUNTER — Ambulatory Visit
Admission: RE | Admit: 2023-11-15 | Discharge: 2023-11-15 | Disposition: A | Discharge status: Home / Self Care | Source: Ambulatory Visit | Attending: Radiation Oncology | Admitting: Radiation Oncology

## 2023-11-15 ENCOUNTER — Telehealth: Payer: Self-pay | Admitting: Psychiatry

## 2023-11-15 ENCOUNTER — Other Ambulatory Visit

## 2023-11-15 ENCOUNTER — Telehealth: Payer: Self-pay | Admitting: Pharmacy Technician

## 2023-11-15 ENCOUNTER — Inpatient Hospital Stay

## 2023-11-15 VITALS — BP 125/60

## 2023-11-15 VITALS — BP 127/62 | HR 82 | Temp 97.6°F | Resp 19 | Wt 183.1 lb

## 2023-11-15 DIAGNOSIS — Z515 Encounter for palliative care: Secondary | ICD-10-CM | POA: Diagnosis not present

## 2023-11-15 DIAGNOSIS — Z5111 Encounter for antineoplastic chemotherapy: Secondary | ICD-10-CM

## 2023-11-15 DIAGNOSIS — N189 Chronic kidney disease, unspecified: Secondary | ICD-10-CM | POA: Diagnosis not present

## 2023-11-15 DIAGNOSIS — E876 Hypokalemia: Secondary | ICD-10-CM

## 2023-11-15 DIAGNOSIS — R7989 Other specified abnormal findings of blood chemistry: Secondary | ICD-10-CM

## 2023-11-15 DIAGNOSIS — G893 Neoplasm related pain (acute) (chronic): Secondary | ICD-10-CM | POA: Diagnosis not present

## 2023-11-15 DIAGNOSIS — C519 Malignant neoplasm of vulva, unspecified: Secondary | ICD-10-CM

## 2023-11-15 DIAGNOSIS — Z510 Encounter for antineoplastic radiation therapy: Secondary | ICD-10-CM | POA: Diagnosis not present

## 2023-11-15 LAB — CMP (CANCER CENTER ONLY)
ALT: 71 U/L — ABNORMAL HIGH (ref 0–44)
AST: 61 U/L — ABNORMAL HIGH (ref 15–41)
Albumin: 3.5 g/dL (ref 3.5–5.0)
Alkaline Phosphatase: 255 U/L — ABNORMAL HIGH (ref 38–126)
Anion gap: 7 (ref 5–15)
BUN: 12 mg/dL (ref 8–23)
CO2: 28 mmol/L (ref 22–32)
Calcium: 8.9 mg/dL (ref 8.9–10.3)
Chloride: 96 mmol/L — ABNORMAL LOW (ref 98–111)
Creatinine: 1.24 mg/dL — ABNORMAL HIGH (ref 0.44–1.00)
GFR, Estimated: 48 mL/min — ABNORMAL LOW (ref >60)
Glucose, Bld: 105 mg/dL — ABNORMAL HIGH (ref 70–99)
Potassium: 4.2 mmol/L (ref 3.5–5.1)
Sodium: 131 mmol/L — ABNORMAL LOW (ref 135–145)
Total Bilirubin: 0.5 mg/dL (ref 0.0–1.2)
Total Protein: 7.2 g/dL (ref 6.5–8.1)

## 2023-11-15 LAB — CBC WITH DIFFERENTIAL (CANCER CENTER ONLY)
Abs Immature Granulocytes: 0.03 K/uL (ref 0.00–0.07)
Basophils Absolute: 0.1 K/uL (ref 0.0–0.1)
Basophils Relative: 1 %
Eosinophils Absolute: 1.2 K/uL — ABNORMAL HIGH (ref 0.0–0.5)
Eosinophils Relative: 13 %
HCT: 36.8 % (ref 36.0–46.0)
Hemoglobin: 12.8 g/dL (ref 12.0–15.0)
Immature Granulocytes: 0 %
Lymphocytes Relative: 37 %
Lymphs Abs: 3.4 K/uL (ref 0.7–4.0)
MCH: 35.9 pg — ABNORMAL HIGH (ref 26.0–34.0)
MCHC: 34.8 g/dL (ref 30.0–36.0)
MCV: 103.1 fL — ABNORMAL HIGH (ref 80.0–100.0)
Monocytes Absolute: 0.9 K/uL (ref 0.1–1.0)
Monocytes Relative: 10 %
Neutro Abs: 3.6 K/uL (ref 1.7–7.7)
Neutrophils Relative %: 39 %
Platelet Count: 190 K/uL (ref 150–400)
RBC: 3.57 MIL/uL — ABNORMAL LOW (ref 3.87–5.11)
RDW: 11.8 % (ref 11.5–15.5)
WBC Count: 9.1 K/uL (ref 4.0–10.5)
nRBC: 0 % (ref 0.0–0.2)

## 2023-11-15 LAB — VITAMIN D 25 HYDROXY (VIT D DEFICIENCY, FRACTURES): Vit D, 25-Hydroxy: 81.42 ng/mL (ref 30–100)

## 2023-11-15 LAB — MAGNESIUM: Magnesium: 2.2 mg/dL (ref 1.7–2.4)

## 2023-11-15 MED ORDER — DEXAMETHASONE SODIUM PHOSPHATE 10 MG/ML IJ SOLN
10.0000 mg | Freq: Once | INTRAMUSCULAR | Status: AC
  Administered 2023-11-15 (×2): 10 mg via INTRAVENOUS
  Filled 2023-11-15: qty 1

## 2023-11-15 MED ORDER — POTASSIUM CHLORIDE IN NACL 20-0.9 MEQ/L-% IV SOLN
Freq: Once | INTRAVENOUS | Status: AC
  Filled 2023-11-15: qty 1000

## 2023-11-15 MED ORDER — PALONOSETRON HCL INJECTION 0.25 MG/5ML
0.2500 mg | Freq: Once | INTRAVENOUS | Status: AC
  Administered 2023-11-15 (×2): 0.25 mg via INTRAVENOUS
  Filled 2023-11-15: qty 5

## 2023-11-15 MED ORDER — SODIUM CHLORIDE 0.9 % IV SOLN
40.0000 mg/m2 | Freq: Once | INTRAVENOUS | Status: AC
  Administered 2023-11-15 (×2): 76 mg via INTRAVENOUS
  Filled 2023-11-15: qty 76

## 2023-11-15 MED ORDER — MAGNESIUM SULFATE 2 GM/50ML IV SOLN
2.0000 g | Freq: Once | INTRAVENOUS | Status: AC
  Administered 2023-11-15 (×2): 2 g via INTRAVENOUS
  Filled 2023-11-15: qty 50

## 2023-11-15 MED ORDER — SODIUM CHLORIDE 0.9 % IV SOLN
150.0000 mg | Freq: Once | INTRAVENOUS | Status: AC
  Administered 2023-11-15 (×2): 150 mg via INTRAVENOUS
  Filled 2023-11-15: qty 150
  Filled 2023-11-15: qty 5

## 2023-11-15 MED ORDER — SODIUM CHLORIDE 0.9 % IV SOLN
INTRAVENOUS | Status: DC
  Filled 2023-11-15: qty 250

## 2023-11-15 MED ORDER — ACETAMINOPHEN 325 MG PO TABS
650.0000 mg | ORAL_TABLET | Freq: Once | ORAL | Status: AC
  Administered 2023-11-15 (×2): 650 mg via ORAL
  Filled 2023-11-15: qty 2

## 2023-11-15 NOTE — Progress Notes (Signed)
 Palliative Medicine [facility] at [facility] Telephone:([phone]) [phone] [phone]   Name: Patricia Good Date: 11/15/2023 MRN: [id number]  DOB: 1960/06/04  Patient Care Team: Sampson Ethridge LABOR, MD as PCP - General (Internal Medicine) [name] Shasta POUR, RN as Oncology Nurse Navigator Maurie Rayfield BIRCH, RN as Oncology Nurse Navigator Melanee Annah JAYSON, MD as Consulting Physician (Oncology) Lenn Aran, MD as Consulting Physician (Radiation Oncology)    REASON FOR CONSULTATION: Patricia Good is a 63 y.o. 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  Patient was not a surgical candidate.  She was referred to palliative care to address goals and management of symptoms.  SOCIAL HISTORY:     reports that she has been smoking cigarettes. She started smoking about 54 years ago. She has a 27.4 pack-year smoking history. She has never used smokeless tobacco. She reports that she does not currently use alcohol after a past usage of about 1.0 - 2.0 standard drink of alcohol per week. She reports that she does not use drugs.  Patient is unmarried and has no children.  She has a sister who is involved in her care.  Patient currently living in a boardinghouse.  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   Personal history of radiation therapy     Pre-diabetes    PTSD (post-traumatic stress disorder)    Schizoaffective disorder (HCC)     PAST SURGICAL HISTORY:  Past Surgical History:  Procedure Laterality Date   BREAST BIOPSY Left 03/03/2022   Left breast stereo bx Ribbon Clip- path pending   BREAST BIOPSY Left 03/03/2022   MM LT BREAST BX W LOC DEV 1ST LESION IMAGE BX SPEC STEREO GUIDE 03/03/2022 [facility]   BREAST LUMPECTOMY Left 03/20/2022   ECTOPIC PREGNANCY SURGERY     IR IMAGING GUIDED PORT INSERTION  11/11/2023   PART MASTECTOMY,RADIO FREQUENCY LOCALIZER,AXILLARY SENTINEL NODE BIOPSY Left 03/20/2022   Procedure: PART MASTECTOMY,RADIO FREQUENCY LOCALIZER,AXILLARY SENTINEL NODE BIOPSY;  Surgeon: Rodolph Romano, MD;  Location: [facility] ORS;  Service: General;  Laterality: Left;   TOTAL ABDOMINAL HYSTERECTOMY W/ BILATERAL SALPINGOOPHORECTOMY  1990    HEMATOLOGY/ONCOLOGY HISTORY:  Oncology History  Malignant neoplasm of upper-inner quadrant of left breast in female, estrogen receptor positive (HCC)  03/10/2022 Initial Diagnosis   Malignant neoplasm of upper-inner quadrant of left breast in female, estrogen receptor positive (HCC)   03/10/2022 Cancer Staging   Staging form: Breast, AJCC 8th Edition - Clinical stage from 03/10/2022: Stage IA (cT1a, cN0, cM0, G2, ER+, PR+, HER2-) - Signed by Melanee Annah JAYSON, MD on 03/10/2022 Histologic grading system: 3 grade system   04/07/2022 Cancer Staging   Staging form: Breast, AJCC 8th Edition - Pathologic stage from 04/07/2022: Stage IA (pT1a, pN0, cM0, G3, ER+, PR+, HER2-, Oncotype DX score: 28) - Signed by Melanee,  Annah BROCKS, MD on 04/13/2022 Stage prefix: Initial diagnosis Multigene prognostic tests performed: Oncotype DX Recurrence score range: Greater than or equal to 11 Histologic grading system: 3 grade system   Vulvar cancer (HCC)  08/09/2023 Initial Diagnosis   Vulvar cancer (HCC)   10/11/2023 Cancer Staging   Staging form: Vulva, AJCC V9 - Clinical stage from 10/11/2023: FIGO  Stage IIIB (cT2, cN1b, cM0) - Signed by [name], MD on 10/11/2023 Stage prefix: Initial diagnosis   11/15/2023 -  Chemotherapy   Patient is on Treatment Plan : Vulvar carcinoma Cisplatin  (40) q7d + XRT       ALLERGIES:  is allergic to shellfish allergy, sulfasalazine, and lisinopril.  MEDICATIONS:  Current Outpatient Medications  Medication Sig Dispense Refill   alendronate (FOSAMAX) 70 MG tablet Take 70 mg by mouth once a week.     atorvastatin (LIPITOR) 20 MG tablet Take 20 mg by mouth at bedtime.     buPROPion  (WELLBUTRIN  XL) 150 MG 24 hr tablet TAKE 1 TABLET BY MOUTH DAILY ALONG WITH 300 MG TABLET FOR TOTAL OF 450 MG DAILY 90 tablet 1   buPROPion  (WELLBUTRIN  XL) 300 MG 24 hr tablet Take 1 tablet (300 mg total) by mouth daily. Take along with 150 mg daily , total of 450 mg daily 90 tablet 1   Calcium  Carb-Cholecalciferol 500-10 MG-MCG TABS Take 1 tablet by mouth.     calcium -vitamin D  (OSCAL WITH D) 500-200 MG-UNIT tablet Take 1 tablet by mouth daily with breakfast.     clonazePAM  (KLONOPIN ) 0.5 MG tablet Take 1 tablet (0.5 mg total) by mouth daily as needed for anxiety. Please use it only for severe panic attacks, do not combine with Oxycodone  15 tablet 0   dexamethasone  (DECADRON ) 4 MG tablet Take 2 tablets daily x 3 days starting the day after chemotherapy. Take with food. 30 tablet 1   fluticasone  (FLONASE ) 50 MCG/ACT nasal spray Place into both nostrils as needed for allergies or rhinitis. (Patient not taking: Reported on 11/15/2023)     hydrOXYzine  (ATARAX ) 10 MG tablet Take 2 tablets (20 mg total) by mouth at bedtime as needed. for sleep and anxiety 180 tablet 0   letrozole  (FEMARA ) 2.5 MG tablet Take 1 tablet (2.5 mg total) by mouth daily. 30 tablet 3   levothyroxine  (SYNTHROID , LEVOTHROID) 112 MCG tablet      lidocaine -prilocaine  (EMLA ) cream Apply 1 Application topically as needed. Apply a small amount to port site 1 hour before chemotherapy 30 g 2   OLANZapine  (ZYPREXA ) 5  MG tablet Take 1 tablet (5 mg total) by mouth at bedtime. 90 tablet 3   omeprazole (PRILOSEC) 40 MG capsule Take 40 mg by mouth daily.      ondansetron  (ZOFRAN ) 8 MG tablet Take 1 tablet (8 mg total) by mouth every 8hrs as needed for nausea or vomiting. Start on the third day after cisplatin . 30 tablet 1   oxyCODONE  (OXY IR/ROXICODONE ) 5 MG immediate release tablet Take 0.5-1 tablets (2.5-5 mg total) by mouth every 8 (eight) hours as needed for severe pain (pain score 7-10) (for pain unrelieved by tylenol ). 30 tablet 0   potassium chloride  SA (KLOR-CON  M) 20 MEQ tablet Take 1 tablet (20 mEq total) by mouth daily. For low potassium 30 tablet 0   prazosin  (MINIPRESS ) 2 MG capsule TAKE ONE CAPSULE BY MOUTH AT BEDTIME. 90 capsule 1   prochlorperazine  (COMPAZINE ) 10 MG tablet Take 1 tablet (10 mg total) by mouth every 6 (six) hours as needed for  nausea or vomiting. 30 tablet 1   No current facility-administered medications for this visit.   Facility-Administered Medications Ordered in Other Visits  Medication Dose Route Frequency Provider Last Rate Last Admin   0.9 %  sodium chloride  infusion   Intravenous Continuous [name], MD       CISplatin  (PLATINOL ) 76 mg in sodium chloride  0.9 % 250 mL chemo infusion  40 mg/m2 (Treatment Plan Recorded) Intravenous Once Rao, Archana C, MD       dexamethasone  (DECADRON ) injection 10 mg  10 mg Intravenous Once Rao, Archana C, MD       fosaprepitant  (EMEND) 150 mg in sodium chloride  0.9 % 145 mL IVPB  150 mg Intravenous Once Rao, Archana C, MD       magnesium  sulfate IVPB 2 g 50 mL  2 g Intravenous Once Rao, Archana C, MD 50 mL/hr at 11/15/23 0950 2 g at 11/15/23 0950   palonosetron  (ALOXI ) injection 0.25 mg  0.25 mg Intravenous Once Rao, Archana C, MD        VITAL SIGNS: There were no vitals taken for this visit. There were no vitals filed for this visit.  Estimated body mass index is 34.6 kg/m as calculated from the following:   Height as of 11/11/23:  5' 1 (1.549 m).   Weight as of an earlier encounter on 11/15/23: 183 lb 1.6 oz (83.1 kg).  LABS: CBC:    Component Value Date/Time   WBC 9.1 11/15/2023 0845   WBC 8.4 09/15/2023 1203   HGB 12.8 11/15/2023 0845   HGB 12.8 07/12/2017 1038   HCT 36.8 11/15/2023 0845   HCT 36.5 07/12/2017 1038   PLT 190 11/15/2023 0845   PLT 258 07/12/2017 1038   MCV 103.1 (H) 11/15/2023 0845   MCV 101 (H) 07/12/2017 1038   MCV 109 (H) 11/29/2013 0455   NEUTROABS 3.6 11/15/2023 0845   NEUTROABS 2.8 07/12/2017 1038   NEUTROABS 16.4 (H) 11/29/2013 0455   LYMPHSABS 3.4 11/15/2023 0845   LYMPHSABS 4.0 (H) 07/12/2017 1038   LYMPHSABS 1.4 11/29/2013 0455   MONOABS 0.9 11/15/2023 0845   MONOABS 0.3 11/29/2013 0455   EOSABS 1.2 (H) 11/15/2023 0845   EOSABS 0.9 (H) 07/12/2017 1038   EOSABS 0.0 11/29/2013 0455   BASOSABS 0.1 11/15/2023 0845   BASOSABS 0.0 07/12/2017 1038   BASOSABS 0.0 11/29/2013 0455   Comprehensive Metabolic Panel:    Component Value Date/Time   NA 131 (L) 11/15/2023 0845   NA 139 07/12/2017 1038   NA 138 11/29/2013 0455   K 4.2 11/15/2023 0845   K 3.9 11/29/2013 0455   CL 96 (L) 11/15/2023 0845   CL 105 11/29/2013 0455   CO2 28 11/15/2023 0845   CO2 23 11/29/2013 0455   BUN 12 11/15/2023 0845   BUN 13 07/12/2017 1038   BUN 18 11/29/2013 0455   CREATININE 1.24 (H) 11/15/2023 0845   CREATININE 1.46 (H) 11/29/2013 0455   GLUCOSE 105 (H) 11/15/2023 0845   GLUCOSE 165 (H) 11/29/2013 0455   CALCIUM  8.9 11/15/2023 0845   CALCIUM  8.6 11/29/2013 0455   AST 61 (H) 11/15/2023 0845   ALT 71 (H) 11/15/2023 0845   ALT 32 11/28/2013 0152   ALKPHOS 255 (H) 11/15/2023 0845   ALKPHOS 131 (H) 11/28/2013 0152   BILITOT 0.5 11/15/2023 0845   PROT 7.2 11/15/2023 0845   PROT 7.4 07/12/2017 1038   PROT 7.2 11/28/2013 0152   ALBUMIN 3.5 11/15/2023 0845   ALBUMIN 4.3 07/12/2017  1038   ALBUMIN 3.2 (L) 11/28/2013 0152    RADIOGRAPHIC STUDIES: IR IMAGING GUIDED PORT INSERTION Result  Date: 11/11/2023 INDICATION: History of GYN malignancy.  Poor IV access.  Chemotherapy. EXAM: IMPLANTED PORT A CATH PLACEMENT WITH ULTRASOUND AND FLUOROSCOPIC GUIDANCE MEDICATIONS: None ANESTHESIA/SEDATION: Moderate (conscious) sedation was employed during this procedure. A total of Versed  2 mg and Fentanyl  100 mcg was administered intravenously. Moderate Sedation Time: 25 minutes. The patient's level of consciousness and vital signs were monitored continuously by radiology nursing throughout the procedure under my direct supervision. FLUOROSCOPY: Radiation Exposure Index and estimated peak skin dose (PSD); Reference air kerma (RAK), 1 mGy. COMPLICATIONS: None immediate. PROCEDURE: The procedure, risks, benefits, and alternatives were explained to the patient. Questions regarding the procedure were encouraged and answered. The patient understands and consents to the procedure. The RIGHT neck and chest were prepped with chlorhexidine  in a sterile fashion, and a sterile drape was applied covering the operative field. Maximum barrier sterile technique with sterile gowns and gloves were used for the procedure. A timeout was performed prior to the initiation of the procedure. Local anesthesia was provided with 1% lidocaine  with epinephrine . After creating a small venotomy incision, a micropuncture kit was utilized to access the internal jugular vein under direct, real-time ultrasound guidance. Ultrasound image documentation was performed. The microwire was kinked to measure appropriate catheter length. A subcutaneous port pocket was then created along the upper chest wall utilizing a combination of sharp and blunt dissection. The pocket was irrigated with sterile saline. A single lumen power injectable port was chosen for placement. The 8 Fr catheter was tunneled from the port pocket site to the venotomy incision. The port was placed in the pocket. The external catheter was trimmed to appropriate length. At the venotomy,  an 8 Fr peel-away sheath was placed over a guidewire under fluoroscopic guidance. The catheter was then placed through the sheath and the sheath was removed. Final catheter positioning was confirmed and documented with a fluoroscopic spot radiograph. The port was accessed with a Huber needle, aspirated and flushed with heparinized saline. The port pocket incision was closed with interrupted 3-0 Vicryl suture then Dermabond was applied, including at the venotomy incision. Dressings were placed. The patient tolerated the procedure well without immediate post procedural complication. IMPRESSION: Successful placement of a RIGHT internal jugular approach power injectable Port-A-Cath. The tip of the catheter is positioned within the proximal RIGHT atrium. The catheter is ready for immediate use. Thom Hall, MD [facility] Electronically Signed   By: Thom Hall M.D.   On: 11/11/2023 12:06    PERFORMANCE STATUS (ECOG) : 1 - Symptomatic but completely ambulatory  Review of Systems Unless otherwise noted, a complete review of systems is negative.  Physical Exam General: NAD Pulmonary: Unlabored Extremities: no edema, no joint deformities Skin: no rashes Neurological: nonfocal  IMPRESSION: I met with patient and sister in infusion.  Introduced palliative care services.  Patient with recent diagnosis of stage III SCC of the vulva.  She is on concurrent chemoradiation.  Symptomatically, she endorses pain but says that she is only utilizing her oxycodone  infrequently.  It does help when she takes it.  Overall, she says that symptoms are controlled at the moment.  Patient is currently living in a boardinghouse but is trying to find a low income apartment in a better environment.  Will consult social work to discuss resources.  Patient is unmarried and has no children.  She has a sister,  Lonell Abu, who is involved in her closest support.  Patient  with provided with ACP documents as she is interested in appointing [place] to be her healthcare proxy.  Patient says that she has a signed DNR order in her bedroom.  She confirms that she would not wish to be resuscitated nor would she want her life prolonged artificially machines.  She would be in agreement with treating the treatable and verbalized agreement with current scope of treatment for her cancer.  PLAN: - Continue current scope of treatment - ACP documents provided - DNR/DNI - Referrals to social work and [employer] and nutrition - Continue oxycodone  as needed - Follow-up telephone visit 1 month   Patient expressed understanding and was in agreement with this plan. She also understands that She can call the clinic at any time with any questions, concerns, or complaints.     Time Total: 25 minutes  Visit consisted of counseling and education dealing with the complex and emotionally intense issues of symptom management and palliative care in the setting of serious and potentially life-threatening illness.Greater than 50%  of this time was spent counseling and coordinating care related to the above assessment and plan.  Signed by: Fonda Mower, PhD, NP-C

## 2023-11-15 NOTE — Telephone Encounter (Signed)
 Called patient

## 2023-11-15 NOTE — Progress Notes (Signed)
 Hematology/Oncology Consult note [facility]  Telephone:([phone] [phone]  Patient Care Team: Entzminger, Ethridge LABOR, MD as PCP - General (Internal Medicine) [name] Shasta POUR, RN as Oncology Nurse Navigator Maurie Rayfield BIRCH, RN as Oncology Nurse Navigator [name], MD as Consulting Physician (Oncology) Lenn Aran, MD as Consulting Physician (Radiation Oncology)   Name of the patient: Patricia Good  [id number]  [birth year]   Date of visit: 11/15/23  Diagnosis- 1.  History of breast cancer in remission 2.  Locally advanced vulvar carcinoma  Chief complaint/ Reason for visit-on treatment assessment prior to cycle 1 of weekly cisplatin  chemotherapy  Heme/Onc history: Patient is a 63 year old female with history of stage I left breast cancer ER/PR positive HER2 negative.  She underwent lumpectomy and sentinel lymph node biopsy.  She declined adjuvant chemotherapy despite her recurrence score of 28.  She also stopped radiation therapy prematurely.She is presently on letrozole  for endocrine therapy.  She has baseline osteoporosis for which she is on Fosamax.   She was seen by infectious disease for herpetic like lesions but overall lesions were concerning for malignancy and patient was seen by Dr. Lovetta April 2025.  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    PET CT scan on 09/10/2023 showed: 1. Superficial right vulvar lesion measuring up to 3.3 cm in length with maximum SUV 13.6, compatible with malignancy. 2. Small amount of accentuated activity along the right upper medial thigh just below the crease with the pubis, with associated soft tissue density measuring about 0.7 by 1.7 cm, with maximum SUV  7.7, concerning for malignancy. 3. Right inguinal and right external iliac lymph nodes with maximum above blood pool, suspicious for metastatic disease.  Plan is for weekly cisplatin  for concurrent chemoradiation  Interval history-patient is here with his sister today and is overall in good spirits to get started with treatment this week.  She reports drinking plenty of fluids at home.  ECOG PS- 1 Pain scale- 2 Opioid associated constipation- no  Review of systems- Review of Systems  Constitutional:  Negative for chills, fever, malaise/fatigue and weight loss.  HENT:  Negative for congestion, ear discharge and nosebleeds.   Eyes:  Negative for blurred vision.  Respiratory:  Negative for cough, hemoptysis, sputum production, shortness of breath and wheezing.   Cardiovascular:  Negative for chest pain, palpitations, orthopnea and claudication.  Gastrointestinal:  Negative for abdominal pain, blood in stool, constipation, diarrhea, heartburn, melena, nausea and vomiting.  Genitourinary:  Negative for dysuria, flank pain, frequency, hematuria and urgency.  Musculoskeletal:  Negative for back pain, joint pain and myalgias.  Skin:  Negative for rash.  Neurological:  Negative for dizziness, tingling, focal weakness, seizures, weakness and headaches.  Endo/Heme/Allergies:  Does not bruise/bleed easily.  Psychiatric/Behavioral:  Negative for depression and suicidal ideas. The patient does not have insomnia.       Allergies  Allergen Reactions   Shellfish Allergy Anaphylaxis    Only to crab meat (non-imitation), not shrimp   Sulfasalazine Anaphylaxis and Hives    Other reaction(s): Other (See Comments)   Lisinopril     unknown     Past Medical History:  Diagnosis Date   Abnormal thyroid  stimulating hormone (TSH) level    Anemia    Anxiety    Asthma    Bipolar 1 disorder (  HCC)    COPD (chronic obstructive pulmonary disease) (HCC)    Endometriosis    GERD (gastroesophageal reflux  disease)    Hepatitis A    Hypertension    Hypothyroidism    Malignant neoplasm of upper-inner quadrant of left breast in female, 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   Personal history of radiation therapy    Pre-diabetes    PTSD (post-traumatic stress disorder)    Schizoaffective disorder [facility])      Past Surgical History:  Procedure Laterality Date   BREAST BIOPSY Left 03/03/2022   Left breast stereo bx Ribbon Clip- path pending   BREAST BIOPSY Left 03/03/2022   MM LT BREAST BX W LOC DEV 1ST LESION IMAGE BX SPEC STEREO GUIDE 03/03/2022 [facility]   BREAST LUMPECTOMY Left 03/20/2022   ECTOPIC PREGNANCY SURGERY     IR IMAGING GUIDED PORT INSERTION  11/11/2023   PART MASTECTOMY,RADIO FREQUENCY LOCALIZER,AXILLARY SENTINEL NODE BIOPSY Left 03/20/2022   Procedure: PART MASTECTOMY,RADIO FREQUENCY LOCALIZER,AXILLARY SENTINEL NODE BIOPSY;  Surgeon: Rodolph Romano, MD;  Location: [facility] ORS;  Service: General;  Laterality: Left;   TOTAL ABDOMINAL HYSTERECTOMY W/ BILATERAL SALPINGOOPHORECTOMY  1990    Social History   Socioeconomic History   Marital status: Single    Spouse name: Not on file   Number of children: 0   Years of education: Not on file   Highest education level: Associate degree: occupational, Scientist, product/process development, or vocational program  Occupational History   Not on file  Tobacco Use   Smoking status: Every Day    Current packs/day: 0.50    Average packs/day: 0.5 packs/day for 54.8 years (27.4 ttl pk-yrs)    Types: Cigarettes    Start date: 01/22/1969   Smokeless tobacco: Never   Tobacco comments:    Continues to go down on number per day to 5-7 per day.   Vaping Use   Vaping status: Never Used  Substance and Sexual Activity   Alcohol use: Not Currently    Alcohol/week: 1.0 - 2.0 standard drink of alcohol    Types: 1 Glasses of wine per week    Comment: occasional   Drug use: No   Sexual activity: Not Currently    Birth control/protection: None  Other  Topics Concern   Not on file  Social History Narrative   Lives alone   Social Drivers of Health   Financial Resource Strain: Medium Risk (08/03/2023)   Received from [facility]   Overall Financial Resource Strain (CARDIA)    Difficulty of Paying Living Expenses: Somewhat hard  Food Insecurity: No Food Insecurity (08/19/2023)   Hunger Vital Sign    Worried About Running Out of Food in the Last Year: Never true    Ran Out of Food in the Last Year: Never true  Transportation Needs: No Transportation Needs (08/19/2023)   PRAPARE - Administrator, Civil Service (Medical): No    Lack of Transportation (Non-Medical): No  Physical Activity: Inactive (06/15/2017)   Exercise Vital Sign    Days of Exercise per Week: 0 days    Minutes of Exercise per Session: 0 min  Stress: Stress Concern Present (06/15/2017)   [employer] of Occupational Health - Occupational Stress Questionnaire    Feeling of Stress : Very much  Social Connections: Socially Isolated (06/15/2017)   Social Connection and Isolation Panel    Frequency of Communication with Friends and Family: Never    Frequency of Social Gatherings with Friends and Family: Never  Attends Religious Services: Never    Active Member of Clubs or Organizations: No    Attends Banker Meetings: Never    Marital Status: Divorced  Catering manager Violence: Not At Risk (08/19/2023)   Humiliation, Afraid, Rape, and Kick questionnaire    Fear of Current or Ex-Partner: No    Emotionally Abused: No    Physically Abused: No    Sexually Abused: No    Family History  Problem Relation Age of Onset   Hypertension Father    Diabetes Father    Heart disease Father    Blindness Father    Alcohol abuse Sister    Drug abuse Sister    Anxiety disorder Sister    Depression Sister    Obesity Brother    Arthritis Brother    Alcohol abuse Brother    Drug abuse Brother    Depression Brother    Breast cancer  Cousin    Clotting disorder Neg Hx      Current Outpatient Medications:    alendronate (FOSAMAX) 70 MG tablet, Take 70 mg by mouth once a week., Disp: , Rfl:    atorvastatin (LIPITOR) 20 MG tablet, Take 20 mg by mouth at bedtime., Disp: , Rfl:    buPROPion  (WELLBUTRIN  XL) 150 MG 24 hr tablet, TAKE 1 TABLET BY MOUTH DAILY ALONG WITH 300 MG TABLET FOR TOTAL OF 450 MG DAILY, Disp: 90 tablet, Rfl: 1   buPROPion  (WELLBUTRIN  XL) 300 MG 24 hr tablet, Take 1 tablet (300 mg total) by mouth daily. Take along with 150 mg daily , total of 450 mg daily, Disp: 90 tablet, Rfl: 1   Calcium  Carb-Cholecalciferol 500-10 MG-MCG TABS, Take 1 tablet by mouth., Disp: , Rfl:    calcium -vitamin D  (OSCAL WITH D) 500-200 MG-UNIT tablet, Take 1 tablet by mouth daily with breakfast., Disp: , Rfl:    clonazePAM  (KLONOPIN ) 0.5 MG tablet, Take 1 tablet (0.5 mg total) by mouth daily as needed for anxiety. Please use it only for severe panic attacks, do not combine with Oxycodone , Disp: 15 tablet, Rfl: 0   dexamethasone  (DECADRON ) 4 MG tablet, Take 2 tablets daily x 3 days starting the day after chemotherapy. Take with food., Disp: 30 tablet, Rfl: 1   hydrOXYzine  (ATARAX ) 10 MG tablet, Take 2 tablets (20 mg total) by mouth at bedtime as needed. for sleep and anxiety, Disp: 180 tablet, Rfl: 0   letrozole  (FEMARA ) 2.5 MG tablet, Take 1 tablet (2.5 mg total) by mouth daily., Disp: 30 tablet, Rfl: 3   levothyroxine  (SYNTHROID , LEVOTHROID) 112 MCG tablet, , Disp: , Rfl:    lidocaine -prilocaine  (EMLA ) cream, Apply 1 Application topically as needed. Apply a small amount to port site 1 hour before chemotherapy, Disp: 30 g, Rfl: 2   OLANZapine  (ZYPREXA ) 5 MG tablet, Take 1 tablet (5 mg total) by mouth at bedtime., Disp: 90 tablet, Rfl: 3   omeprazole (PRILOSEC) 40 MG capsule, Take 40 mg by mouth daily. , Disp: , Rfl:    ondansetron  (ZOFRAN ) 8 MG tablet, Take 1 tablet (8 mg total) by mouth every 8hrs as needed for nausea or vomiting. Start  on the third day after cisplatin ., Disp: 30 tablet, Rfl: 1   oxyCODONE  (OXY IR/ROXICODONE ) 5 MG immediate release tablet, Take 0.5-1 tablets (2.5-5 mg total) by mouth every 8 (eight) hours as needed for severe pain (pain score 7-10) (for pain unrelieved by tylenol )., Disp: 30 tablet, Rfl: 0   potassium chloride  SA (KLOR-CON  M) 20 MEQ tablet, Take  1 tablet (20 mEq total) by mouth daily. For low potassium, Disp: 30 tablet, Rfl: 0   prazosin  (MINIPRESS ) 2 MG capsule, TAKE ONE CAPSULE BY MOUTH AT BEDTIME., Disp: 90 capsule, Rfl: 1   prochlorperazine  (COMPAZINE ) 10 MG tablet, Take 1 tablet (10 mg total) by mouth every 6 (six) hours as needed for nausea or vomiting., Disp: 30 tablet, Rfl: 1   fluticasone  (FLONASE ) 50 MCG/ACT nasal spray, Place into both nostrils as needed for allergies or rhinitis. (Patient not taking: Reported on 11/15/2023), Disp: , Rfl:  No current facility-administered medications for this visit.  Facility-Administered Medications Ordered in Other Visits:    0.9 %  sodium chloride  infusion, , Intravenous, Continuous, [name], MD, Last Rate: 10 mL/hr at 11/15/23 1152, New Bag at 11/15/23 1152   CISplatin  (PLATINOL ) 76 mg in sodium chloride  0.9 % 250 mL chemo infusion, 40 mg/m2 (Treatment Plan Recorded), Intravenous, Once, [name], MD  Physical exam:  Vitals:   11/15/23 0902  BP: 127/62  Pulse: 82  Resp: 19  Temp: 97.6 F (36.4 C)  SpO2: 99%  Weight: 183 lb 1.6 oz (83.1 kg)   Physical Exam Cardiovascular:     Rate and Rhythm: Normal rate and regular rhythm.     Heart sounds: Normal heart sounds.  Pulmonary:     Effort: Pulmonary effort is normal.     Breath sounds: Normal breath sounds.  Skin:    General: Skin is warm and dry.  Neurological:     Mental Status: She is alert and oriented to person, place, and time.      I have personally reviewed labs listed below:    Latest Ref Rng & Units 11/15/2023    8:45 AM  CMP  Glucose 70 - 99 mg/dL 894   BUN 8  - 23 mg/dL 12   Creatinine 9.55 - 1.00 mg/dL 8.75   Sodium 864 - 854 mmol/L 131   Potassium 3.5 - 5.1 mmol/L 4.2   Chloride 98 - 111 mmol/L 96   CO2 22 - 32 mmol/L 28   Calcium  8.9 - 10.3 mg/dL 8.9   Total Protein 6.5 - 8.1 g/dL 7.2   Total Bilirubin 0.0 - 1.2 mg/dL 0.5   Alkaline Phos 38 - 126 U/L 255   AST 15 - 41 U/L 61   ALT 0 - 44 U/L 71       Latest Ref Rng & Units 11/15/2023    8:45 AM  CBC  WBC 4.0 - 10.5 K/uL 9.1   Hemoglobin 12.0 - 15.0 g/dL 87.1   Hematocrit 63.9 - 46.0 % 36.8   Platelets 150 - 400 K/uL 190    I have personally reviewed Radiology images listed below: No images are attached to the encounter.  IR IMAGING GUIDED PORT INSERTION Result Date: 11/11/2023 INDICATION: History of GYN malignancy.  Poor IV access.  Chemotherapy. EXAM: IMPLANTED PORT A CATH PLACEMENT WITH ULTRASOUND AND FLUOROSCOPIC GUIDANCE MEDICATIONS: None ANESTHESIA/SEDATION: Moderate (conscious) sedation was employed during this procedure. A total of Versed  2 mg and Fentanyl  100 mcg was administered intravenously. Moderate Sedation Time: 25 minutes. The patient's level of consciousness and vital signs were monitored continuously by radiology nursing throughout the procedure under my direct supervision. FLUOROSCOPY: Radiation Exposure Index and estimated peak skin dose (PSD); Reference air kerma (RAK), 1 mGy. COMPLICATIONS: None immediate. PROCEDURE: The procedure, risks, benefits, and alternatives were explained to the patient. Questions regarding the procedure were encouraged and answered. The patient understands and consents to  the procedure. The RIGHT neck and chest were prepped with chlorhexidine  in a sterile fashion, and a sterile drape was applied covering the operative field. Maximum barrier sterile technique with sterile gowns and gloves were used for the procedure. A timeout was performed prior to the initiation of the procedure. Local anesthesia was provided with 1% lidocaine  with epinephrine .  After creating a small venotomy incision, a micropuncture kit was utilized to access the internal jugular vein under direct, real-time ultrasound guidance. Ultrasound image documentation was performed. The microwire was kinked to measure appropriate catheter length. A subcutaneous port pocket was then created along the upper chest wall utilizing a combination of sharp and blunt dissection. The pocket was irrigated with sterile saline. A single lumen power injectable port was chosen for placement. The 8 Fr catheter was tunneled from the port pocket site to the venotomy incision. The port was placed in the pocket. The external catheter was trimmed to appropriate length. At the venotomy, an 8 Fr peel-away sheath was placed over a guidewire under fluoroscopic guidance. The catheter was then placed through the sheath and the sheath was removed. Final catheter positioning was confirmed and documented with a fluoroscopic spot radiograph. The port was accessed with a Huber needle, aspirated and flushed with heparinized saline. The port pocket incision was closed with interrupted 3-0 Vicryl suture then Dermabond was applied, including at the venotomy incision. Dressings were placed. The patient tolerated the procedure well without immediate post procedural complication. IMPRESSION: Successful placement of a RIGHT internal jugular approach power injectable Port-A-Cath. The tip of the catheter is positioned within the proximal RIGHT atrium. The catheter is ready for immediate use. Thom Hall, MD [facility] Electronically Signed   By: Thom Hall M.D.   On: 11/11/2023 12:06     Assessment and plan- Patient is a 63 y.o. 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  She is here for on treatment assessment prior to cycle 1 of weekly cisplatin  chemotherapy.  Counts okay to proceed with cycle 1 of  weekly cisplatin  chemotherapy today and I will see her back next week for cycle 2.  Discussed this and benefits of cisplatin  including all but not limited to nausea vomiting low blood counts risk of infections hospitalization as well as acute kidney injury, peripheral neuropathy and hearing loss.  Patient has mild CKD with a creatinine that fluctuates between 1.2-1.4 with GFR in the 40's.  We will monitor her kidney functions closely  Hypokalemia: Resolved.  She will continue with oral potassium 20 mill equivalents daily however.   Visit Diagnosis 1. Vulvar cancer (HCC)   2. Encounter for antineoplastic chemotherapy   3. Hypokalemia      Dr. Annah Skene, MD, MPH [facility] at [facility] [phone] 11/15/2023 12:43 PM

## 2023-11-15 NOTE — Telephone Encounter (Signed)
 Returned patient call, she reported that she was having a rough day when she called the office however currently feels good.  She was able to get a port and started chemotherapy.  She is currently getting her chemotherapy treatment.  She currently denies any significant concerns and would like to schedule a follow-up appointment since the appointment which was scheduled for August 18 may not work out.

## 2023-11-15 NOTE — Telephone Encounter (Signed)
 Patient approved for [employer].  Provided a copy of letter from landlord asking for assistance with rent.  Made patient aware that landlord would need to complete a W-9 form.  A W-9 form was given to patient as well as mailed to [employer], landlord.  Made patient aware that we need to receive the W-9 form before reimbursement can take place for the landlord.  Patient verbally acknowledged that she understood.  Dickey DOROTHA Fritter Patient Services Navigator [facility]

## 2023-11-15 NOTE — Patient Instructions (Signed)
 [facility] - A DEPT OF [facility]  Discharge Instructions: Thank you for choosing [facility] to provide your oncology and hematology care.  If you have a lab appointment with the Cancer Center, please go directly to the Cancer Center and check in at the registration area.  Wear comfortable clothing and clothing appropriate for easy access to any Portacath or PICC line.   We strive to give you quality time with your provider. You may need to reschedule your appointment if you arrive late (15 or more minutes).  Arriving late affects you and other patients whose appointments are after yours.  Also, if you miss three or more appointments without notifying the office, you may be dismissed from the clinic at the provider's discretion.      For prescription refill requests, have your pharmacy contact our office and allow 72 hours for refills to be completed.    Today you received the following chemotherapy and/or immunotherapy agents CISPLATIN      To help prevent nausea and vomiting after your treatment, we encourage you to take your nausea medication as directed.  BELOW ARE SYMPTOMS THAT SHOULD BE REPORTED IMMEDIATELY: *FEVER GREATER THAN 100.4 F (38 C) OR HIGHER *CHILLS OR SWEATING *NAUSEA AND VOMITING THAT IS NOT CONTROLLED WITH YOUR NAUSEA MEDICATION *UNUSUAL SHORTNESS OF BREATH *UNUSUAL BRUISING OR BLEEDING *URINARY PROBLEMS (pain or burning when urinating, or frequent urination) *BOWEL PROBLEMS (unusual diarrhea, constipation, pain near the anus) TENDERNESS IN MOUTH AND THROAT WITH OR WITHOUT PRESENCE OF ULCERS (sore throat, sores in mouth, or a toothache) UNUSUAL RASH, SWELLING OR PAIN  UNUSUAL VAGINAL DISCHARGE OR ITCHING   Items with * indicate a potential emergency and should be followed up as soon as possible or go to the Emergency Department if any problems should occur.  Please show the CHEMOTHERAPY ALERT CARD or IMMUNOTHERAPY  ALERT CARD at check-in to the Emergency Department and triage nurse.  Should you have questions after your visit or need to cancel or reschedule your appointment, please contact [facility] - A DEPT OF [facility]  [phone] and follow the prompts.  Office hours are 8:00 a.m. to 4:30 p.m. Monday - Friday. Please note that voicemails left after 4:00 p.m. may not be returned until the following business day.  We are closed weekends and major holidays. You have access to a nurse at all times for urgent questions. Please call the main number to the clinic [phone] and follow the prompts.  For any non-urgent questions, you may also contact your provider using MyChart. We now offer e-Visits for anyone 49 and older to request care online for non-urgent symptoms. For details visit [email or web address].   Also download the MyChart app! Go to the app store, search "MyChart", open the app, select [facility], and log in with your MyChart username and password.  Cisplatin Injection What is this medication? CISPLATIN (SIS pla tin) treats some types of cancer. It works by slowing down the growth of cancer cells. This medicine may be used for other purposes; ask your health care provider or pharmacist if you have questions. COMMON BRAND NAME(S): Platinol, Platinol -AQ What should I tell my care team before I take this medication? They need to know if you have any of these conditions: Eye disease, vision problems Hearing problems Kidney disease Low blood counts, such as low white cells, platelets, or red blood cells Tingling of the fingers  or toes, or other nerve disorder An unusual or allergic reaction to cisplatin, carboplatin, oxaliplatin, other medications, foods, dyes, or preservatives If you or your partner are pregnant or trying to get pregnant Breast-feeding How should I use this medication? This medication is injected into a vein. It is given by your care  team in a hospital or clinic setting. Talk to your care team about the use of this medication in children. Special care may be needed. Overdosage: If you think you have taken too much of this medicine contact a poison control center or emergency room at once. NOTE: This medicine is only for you. Do not share this medicine with others. What if I miss a dose? Keep appointments for follow-up doses. It is important not to miss your dose. Call your care team if you are unable to keep an appointment. What may interact with this medication? Do not take this medication with any of the following: Live virus vaccines This medication may also interact with the following: Certain antibiotics, such as amikacin, gentamicin, neomycin, polymyxin B, streptomycin, tobramycin, vancomycin Foscarnet This list may not describe all possible interactions. Give your health care provider a list of all the medicines, herbs, non-prescription drugs, or dietary supplements you use. Also tell them if you smoke, drink alcohol, or use illegal drugs. Some items may interact with your medicine. What should I watch for while using this medication? Your condition will be monitored carefully while you are receiving this medication. You may need blood work done while taking this medication. This medication may make you feel generally unwell. This is not uncommon, as chemotherapy can affect healthy cells as well as cancer cells. Report any side effects. Continue your course of treatment even though you feel ill unless your care team tells you to stop. This medication may increase your risk of getting an infection. Call your care team for advice if you get a fever, chills, sore throat, or other symptoms of a cold or flu. Do not treat yourself. Try to avoid being around people who are sick. Avoid taking medications that contain aspirin, acetaminophen, ibuprofen, naproxen, or ketoprofen unless instructed by your care team. These medications  may hide a fever. This medication may increase your risk to bruise or bleed. Call your care team if you notice any unusual bleeding. Be careful brushing or flossing your teeth or using a toothpick because you may get an infection or bleed more easily. If you have any dental work done, tell your dentist you are receiving this medication. Drink fluids as directed while you are taking this medication. This will help protect your kidneys. Call your care team if you get diarrhea. Do not treat yourself. Talk to your care team if you or your partner wish to become pregnant or think you might be pregnant. This medication can cause serious birth defects if taken during pregnancy and for 14 months after the last dose. A negative pregnancy test is required before starting this medication. A reliable form of contraception is recommended while taking this medication and for 14 months after the last dose. Talk to your care team about effective forms of contraception. Do not father a child while taking this medication and for 11 months after the last dose. Use a condom during sex during this time period. Do not breast-feed while taking this medication. This medication may cause infertility. Talk to your care team if you are concerned about your fertility. What side effects may I notice from receiving this medication? Side  effects that you should report to your care team as soon as possible: Allergic reactions--skin rash, itching, hives, swelling of the face, lips, tongue, or throat Eye pain, change in vision, vision loss Hearing loss, ringing in ears Infection--fever, chills, cough, sore throat, wounds that don't heal, pain or trouble when passing urine, general feeling of discomfort or being unwell Kidney injury--decrease in the amount of urine, swelling of the ankles, hands, or feet Low red blood cell level--unusual weakness or fatigue, dizziness, headache, trouble breathing Painful swelling, warmth, or redness  of the skin, blisters or sores at the infusion site Pain, tingling, or numbness in the hands or feet Unusual bruising or bleeding Side effects that usually do not require medical attention (report to your care team if they continue or are bothersome): Hair loss Nausea Vomiting This list may not describe all possible side effects. Call your doctor for medical advice about side effects. You may report side effects to FDA at 1-800-FDA-1088. Where should I keep my medication? This medication is given in a hospital or clinic. It will not be stored at home. NOTE: This sheet is a summary. It may not cover all possible information. If you have questions about this medicine, talk to your doctor, pharmacist, or health care provider.  2024 Elsevier/Gold Standard (2021-07-25 00:00:00)

## 2023-11-15 NOTE — Telephone Encounter (Signed)
 Thank you :)

## 2023-11-16 ENCOUNTER — Other Ambulatory Visit: Payer: Self-pay

## 2023-11-16 ENCOUNTER — Ambulatory Visit

## 2023-11-16 ENCOUNTER — Telehealth: Payer: Self-pay | Admitting: *Deleted

## 2023-11-16 ENCOUNTER — Ambulatory Visit
Admission: RE | Admit: 2023-11-16 | Discharge: 2023-11-16 | Disposition: A | Discharge status: Home / Self Care | Source: Ambulatory Visit | Attending: Radiation Oncology | Admitting: Radiation Oncology

## 2023-11-16 ENCOUNTER — Telehealth: Payer: Self-pay

## 2023-11-16 DIAGNOSIS — Z510 Encounter for antineoplastic radiation therapy: Secondary | ICD-10-CM | POA: Diagnosis not present

## 2023-11-16 LAB — RAD ONC ARIA SESSION SUMMARY
Course Elapsed Days: 0
Plan Fractions Treated to Date: 1
Plan Prescribed Dose Per Fraction: 1.8 Gy
Plan Total Fractions Prescribed: 36
Plan Total Prescribed Dose: 64.8 Gy
Reference Point Dosage Given to Date: 1.8 Gy
Reference Point Session Dosage Given: 1.8 Gy
Session Number: 1

## 2023-11-16 NOTE — Telephone Encounter (Signed)
 The patient called today making sure that the letrozole  is safe to use that while she is on her chemo.  Also Inocente then asked me to send a message to Dickey Fritter because she is going to be here today in the cancer center around 4:00 and she wanted to talk to her if she is here.

## 2023-11-16 NOTE — Telephone Encounter (Signed)
Telephone call to patient for follow up after receiving first infusion.   Patient states infusion went great.  States eating good and drinking plenty of fluids.   Denies any nausea or vomiting.  Encouraged patient to call for any concerns or questions. 

## 2023-11-17 ENCOUNTER — Telehealth: Payer: Self-pay | Admitting: Pharmacy Technician

## 2023-11-17 ENCOUNTER — Ambulatory Visit
Admission: RE | Admit: 2023-11-17 | Discharge: 2023-11-17 | Disposition: A | Discharge status: Home / Self Care | Source: Ambulatory Visit | Attending: Radiation Oncology | Admitting: Radiation Oncology

## 2023-11-17 ENCOUNTER — Other Ambulatory Visit: Payer: Self-pay

## 2023-11-17 ENCOUNTER — Ambulatory Visit

## 2023-11-17 ENCOUNTER — Inpatient Hospital Stay: Admitting: Licensed Clinical Social Worker

## 2023-11-17 DIAGNOSIS — Z510 Encounter for antineoplastic radiation therapy: Secondary | ICD-10-CM | POA: Diagnosis not present

## 2023-11-17 LAB — RAD ONC ARIA SESSION SUMMARY
Course Elapsed Days: 1
Plan Fractions Treated to Date: 2
Plan Prescribed Dose Per Fraction: 1.8 Gy
Plan Total Fractions Prescribed: 36
Plan Total Prescribed Dose: 64.8 Gy
Reference Point Dosage Given to Date: 3.6 Gy
Reference Point Session Dosage Given: 1.8 Gy
Session Number: 2

## 2023-11-17 NOTE — Telephone Encounter (Signed)
 Patient stated that she did not need the [employer] card so she gave it to someone else in need.  Patient stated that she has plenty of food and would not obtain another card.  Patricia Good Patient Services Navigator [facility]

## 2023-11-17 NOTE — Progress Notes (Signed)
 [facility] CSW Progress Note  Clinical Child psychotherapist contacted patient by phone to follow-up on emotional support.    Interventions: Provided brief mental health counseling with regard to adjusting to her illness.  Provided information regarding Advance Directives.  She would like another copy.  CSW to leave at the front desk on Thursday, 8/14, for patient pick up.  She stated she has already made arrangements with the [facility].  She is hoping to move into a home in the next few months.  She said she enjoys planting flowers and hopes to do so in her new home.       Follow Up Plan:  CSW will follow-up with patient by phone     Macario CHRISTELLA Au, LCSW Clinical Social Worker [facility]    Patient is participating in a Managed Medicaid Plan:  Yes

## 2023-11-18 ENCOUNTER — Ambulatory Visit

## 2023-11-18 ENCOUNTER — Other Ambulatory Visit: Payer: Self-pay

## 2023-11-18 ENCOUNTER — Ambulatory Visit
Admission: RE | Admit: 2023-11-18 | Discharge: 2023-11-18 | Disposition: A | Discharge status: Home / Self Care | Source: Ambulatory Visit | Attending: Radiation Oncology | Admitting: Radiation Oncology

## 2023-11-18 ENCOUNTER — Encounter: Payer: Self-pay | Admitting: Oncology

## 2023-11-18 DIAGNOSIS — Z510 Encounter for antineoplastic radiation therapy: Secondary | ICD-10-CM | POA: Diagnosis not present

## 2023-11-18 LAB — RAD ONC ARIA SESSION SUMMARY
Course Elapsed Days: 2
Plan Fractions Treated to Date: 3
Plan Prescribed Dose Per Fraction: 1.8 Gy
Plan Total Fractions Prescribed: 36
Plan Total Prescribed Dose: 64.8 Gy
Reference Point Dosage Given to Date: 5.4 Gy
Reference Point Session Dosage Given: 1.8 Gy
Session Number: 3

## 2023-11-18 NOTE — Progress Notes (Signed)
 The patient enrolled in services on 11/16/2023 and initial behavioral health evaluation is scheduled for 11/25/2023.

## 2023-11-19 ENCOUNTER — Other Ambulatory Visit: Payer: Self-pay

## 2023-11-19 ENCOUNTER — Inpatient Hospital Stay (HOSPITAL_BASED_OUTPATIENT_CLINIC_OR_DEPARTMENT_OTHER): Admitting: Hospice and Palliative Medicine

## 2023-11-19 ENCOUNTER — Ambulatory Visit
Admission: RE | Admit: 2023-11-19 | Discharge: 2023-11-19 | Disposition: A | Discharge status: Home / Self Care | Source: Ambulatory Visit | Attending: Radiation Oncology | Admitting: Radiation Oncology

## 2023-11-19 ENCOUNTER — Inpatient Hospital Stay

## 2023-11-19 ENCOUNTER — Telehealth: Payer: Self-pay | Admitting: *Deleted

## 2023-11-19 ENCOUNTER — Encounter: Payer: Self-pay | Admitting: Hospice and Palliative Medicine

## 2023-11-19 ENCOUNTER — Other Ambulatory Visit: Payer: Self-pay | Admitting: *Deleted

## 2023-11-19 ENCOUNTER — Ambulatory Visit

## 2023-11-19 VITALS — BP 119/54 | HR 73 | Temp 98.6°F | Resp 19 | Wt 181.1 lb

## 2023-11-19 DIAGNOSIS — R519 Headache, unspecified: Secondary | ICD-10-CM

## 2023-11-19 DIAGNOSIS — C519 Malignant neoplasm of vulva, unspecified: Secondary | ICD-10-CM

## 2023-11-19 DIAGNOSIS — R7401 Elevation of levels of liver transaminase levels: Secondary | ICD-10-CM

## 2023-11-19 DIAGNOSIS — E871 Hypo-osmolality and hyponatremia: Secondary | ICD-10-CM

## 2023-11-19 DIAGNOSIS — G893 Neoplasm related pain (acute) (chronic): Secondary | ICD-10-CM

## 2023-11-19 DIAGNOSIS — Z510 Encounter for antineoplastic radiation therapy: Secondary | ICD-10-CM | POA: Diagnosis not present

## 2023-11-19 LAB — CMP (CANCER CENTER ONLY)
ALT: 268 U/L — ABNORMAL HIGH (ref 0–44)
AST: 43 U/L — ABNORMAL HIGH (ref 15–41)
Albumin: 3.8 g/dL (ref 3.5–5.0)
Alkaline Phosphatase: 233 U/L — ABNORMAL HIGH (ref 38–126)
Anion gap: 9 (ref 5–15)
BUN: 22 mg/dL (ref 8–23)
CO2: 25 mmol/L (ref 22–32)
Calcium: 8.8 mg/dL — ABNORMAL LOW (ref 8.9–10.3)
Chloride: 93 mmol/L — ABNORMAL LOW (ref 98–111)
Creatinine: 1.14 mg/dL — ABNORMAL HIGH (ref 0.44–1.00)
GFR, Estimated: 53 mL/min — ABNORMAL LOW (ref >60)
Glucose, Bld: 111 mg/dL — ABNORMAL HIGH (ref 70–99)
Potassium: 4.6 mmol/L (ref 3.5–5.1)
Sodium: 127 mmol/L — ABNORMAL LOW (ref 135–145)
Total Bilirubin: 0.7 mg/dL (ref 0.0–1.2)
Total Protein: 7.2 g/dL (ref 6.5–8.1)

## 2023-11-19 LAB — CBC WITH DIFFERENTIAL (CANCER CENTER ONLY)
Abs Immature Granulocytes: 0.06 K/uL (ref 0.00–0.07)
Basophils Absolute: 0 K/uL (ref 0.0–0.1)
Basophils Relative: 0 %
Eosinophils Absolute: 0 K/uL (ref 0.0–0.5)
Eosinophils Relative: 0 %
HCT: 37 % (ref 36.0–46.0)
Hemoglobin: 13 g/dL (ref 12.0–15.0)
Immature Granulocytes: 1 %
Lymphocytes Relative: 12 %
Lymphs Abs: 1.1 K/uL (ref 0.7–4.0)
MCH: 35.5 pg — ABNORMAL HIGH (ref 26.0–34.0)
MCHC: 35.1 g/dL (ref 30.0–36.0)
MCV: 101.1 fL — ABNORMAL HIGH (ref 80.0–100.0)
Monocytes Absolute: 0.3 K/uL (ref 0.1–1.0)
Monocytes Relative: 3 %
Neutro Abs: 7.5 K/uL (ref 1.7–7.7)
Neutrophils Relative %: 84 %
Platelet Count: 242 K/uL (ref 150–400)
RBC: 3.66 MIL/uL — ABNORMAL LOW (ref 3.87–5.11)
RDW: 11.9 % (ref 11.5–15.5)
WBC Count: 9 K/uL (ref 4.0–10.5)
nRBC: 0 % (ref 0.0–0.2)

## 2023-11-19 LAB — MAGNESIUM: Magnesium: 2.2 mg/dL (ref 1.7–2.4)

## 2023-11-19 LAB — RAD ONC ARIA SESSION SUMMARY
Course Elapsed Days: 3
Plan Fractions Treated to Date: 4
Plan Prescribed Dose Per Fraction: 1.8 Gy
Plan Total Fractions Prescribed: 36
Plan Total Prescribed Dose: 64.8 Gy
Reference Point Dosage Given to Date: 7.2 Gy
Reference Point Session Dosage Given: 1.8 Gy
Session Number: 4

## 2023-11-19 NOTE — Telephone Encounter (Signed)
 Pt called per Sidra Mower, NP request and informed that her liver enzymes were elevated and he would like for her to have an ultrasound.  Pt notified and verbalized understanding.  Instructed scheduling would reach out with appointment date and time.  Pt verbalized understanding.

## 2023-11-19 NOTE — Progress Notes (Addendum)
 Symptom Management Clinic [facility] at [facility] Telephone:([phone] Fax:([phone]) [phone]  Patient Care Team: Entzminger, Ethridge LABOR, MD as PCP - General (Internal Medicine) [name] Shasta POUR, RN as Oncology Nurse Navigator Maurie Rayfield BIRCH, RN as Oncology Nurse Navigator [name], MD as Consulting Physician (Oncology) Lenn Aran, MD as Consulting Physician (Radiation Oncology)   NAME OF PATIENT: Patricia Good  [id number]  September 19, 1960   DATE OF VISIT: 11/19/23  REASON FOR CONSULT: Patricia Good is a 63 y.o. 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  Patient is not surgical candidate.   INTERVAL HISTORY: Patient is on treatment with cisplatin  plus XRT.  She was referred to [facility] today by radiation oncology with complaint of headaches.  Patient says she was told to discontinue caffeine and is subsequently being having intermittent headaches.  She generally drinks a lot of tea and coffee each day.  Patient denies visual changes.  Denies aura.  Describes pain as ache.  No throbbing.  No hearing changes or weakness.  No falls or trauma.  Denies any neurologic complaints. Denies recent fevers or illnesses. Denies any easy bleeding or bruising. Reports good appetite and denies weight loss. Denies chest pain. Denies any nausea, vomiting, constipation, or diarrhea. Denies urinary complaints. Patient offers no further specific complaints today.   PAST MEDICAL HISTORY: Past Medical History:  Diagnosis Date   Abnormal thyroid  stimulating hormone (TSH) level    Anemia    Anxiety    Asthma    Bipolar 1 disorder (HCC)    COPD (chronic obstructive pulmonary disease) (HCC)    Endometriosis    GERD (gastroesophageal reflux disease)    Hepatitis A    Hypertension    Hypothyroidism    Malignant neoplasm of upper-inner quadrant of left breast in female, estrogen receptor positive (HCC)  03/2022   Personal history of radiation therapy    Pre-diabetes    PTSD (post-traumatic stress disorder)    Schizoaffective disorder (HCC)     PAST SURGICAL HISTORY:  Past Surgical History:  Procedure Laterality Date   BREAST BIOPSY Left 03/03/2022   Left breast stereo bx Ribbon Clip- path pending   BREAST BIOPSY Left 03/03/2022   MM LT BREAST BX W LOC DEV 1ST LESION IMAGE BX SPEC STEREO GUIDE 03/03/2022 [facility]   BREAST LUMPECTOMY Left 03/20/2022   ECTOPIC PREGNANCY SURGERY     IR IMAGING GUIDED PORT INSERTION  11/11/2023   PART MASTECTOMY,RADIO FREQUENCY LOCALIZER,AXILLARY SENTINEL NODE BIOPSY Left 03/20/2022   Procedure: PART MASTECTOMY,RADIO FREQUENCY LOCALIZER,AXILLARY SENTINEL NODE BIOPSY;  Surgeon: Rodolph Romano, MD;  Location: [facility] ORS;  Service: General;  Laterality: Left;   TOTAL ABDOMINAL HYSTERECTOMY W/ BILATERAL SALPINGOOPHORECTOMY  1990    HEMATOLOGY/ONCOLOGY HISTORY:  Oncology History  Malignant neoplasm of upper-inner quadrant of left breast in female, estrogen receptor positive (HCC)  03/10/2022 Initial Diagnosis   Malignant neoplasm of upper-inner quadrant of left breast in female, estrogen receptor positive (HCC)   03/10/2022 Cancer Staging   Staging form: Breast, AJCC 8th Edition - Clinical stage from 03/10/2022: Stage IA (cT1a, cN0, cM0, G2, ER+, PR+, HER2-) - Signed by [name], MD on 03/10/2022 Histologic grading system: 3 grade system   04/07/2022 Cancer Staging   Staging form: Breast, AJCC 8th Edition - Pathologic stage from 04/07/2022: Stage IA (pT1a, pN0, cM0, G3, ER+, PR+, HER2-, Oncotype DX score: 28) - Signed by [name], MD on 04/13/2022 Stage  prefix: Initial diagnosis Multigene prognostic tests performed: Oncotype DX Recurrence score range: Greater than or equal to 11 Histologic grading system: 3 grade system   Vulvar cancer (HCC)  08/09/2023 Initial Diagnosis   Vulvar cancer (HCC)   10/11/2023 Cancer Staging   Staging form:  Vulva, AJCC V9 - Clinical stage from 10/11/2023: FIGO Stage IIIB (cT2, cN1b, cM0) - Signed by [name], MD on 10/11/2023 Stage prefix: Initial diagnosis   11/15/2023 -  Chemotherapy   Patient is on Treatment Plan : Vulvar carcinoma Cisplatin  (40) q7d + XRT       ALLERGIES:  is allergic to shellfish allergy, sulfasalazine, and lisinopril.  MEDICATIONS:  Current Outpatient Medications  Medication Sig Dispense Refill   alendronate (FOSAMAX) 70 MG tablet Take 70 mg by mouth once a week.     atorvastatin (LIPITOR) 20 MG tablet Take 20 mg by mouth at bedtime.     buPROPion  (WELLBUTRIN  XL) 150 MG 24 hr tablet TAKE 1 TABLET BY MOUTH DAILY ALONG WITH 300 MG TABLET FOR TOTAL OF 450 MG DAILY 90 tablet 1   buPROPion  (WELLBUTRIN  XL) 300 MG 24 hr tablet Take 1 tablet (300 mg total) by mouth daily. Take along with 150 mg daily , total of 450 mg daily 90 tablet 1   Calcium  Carb-Cholecalciferol 500-10 MG-MCG TABS Take 1 tablet by mouth.     calcium -vitamin D  (OSCAL WITH D) 500-200 MG-UNIT tablet Take 1 tablet by mouth daily with breakfast.     clonazePAM  (KLONOPIN ) 0.5 MG tablet Take 1 tablet (0.5 mg total) by mouth daily as needed for anxiety. Please use it only for severe panic attacks, do not combine with Oxycodone  15 tablet 0   dexamethasone  (DECADRON ) 4 MG tablet Take 2 tablets daily x 3 days starting the day after chemotherapy. Take with food. 30 tablet 1   hydrOXYzine  (ATARAX ) 10 MG tablet Take 2 tablets (20 mg total) by mouth at bedtime as needed. for sleep and anxiety 180 tablet 0   letrozole  (FEMARA ) 2.5 MG tablet Take 1 tablet (2.5 mg total) by mouth daily. 30 tablet 3   levothyroxine  (SYNTHROID , LEVOTHROID) 112 MCG tablet      lidocaine -prilocaine  (EMLA ) cream Apply 1 Application topically as needed. Apply a small amount to port site 1 hour before chemotherapy 30 g 2   OLANZapine  (ZYPREXA ) 5 MG tablet Take 1 tablet (5 mg total) by mouth at bedtime. 90 tablet 3   omeprazole (PRILOSEC) 40 MG  capsule Take 40 mg by mouth daily.      ondansetron  (ZOFRAN ) 8 MG tablet Take 1 tablet (8 mg total) by mouth every 8hrs as needed for nausea or vomiting. Start on the third day after cisplatin . 30 tablet 1   oxyCODONE  (OXY IR/ROXICODONE ) 5 MG immediate release tablet Take 0.5-1 tablets (2.5-5 mg total) by mouth every 8 (eight) hours as needed for severe pain (pain score 7-10) (for pain unrelieved by tylenol ). 30 tablet 0   potassium chloride  SA (KLOR-CON  M) 20 MEQ tablet Take 1 tablet (20 mEq total) by mouth daily. For low potassium 30 tablet 0   prazosin  (MINIPRESS ) 2 MG capsule TAKE ONE CAPSULE BY MOUTH AT BEDTIME. 90 capsule 1   prochlorperazine  (COMPAZINE ) 10 MG tablet Take 1 tablet (10 mg total) by mouth every 6 (six) hours as needed for nausea or vomiting. 30 tablet 1   fluticasone  (FLONASE ) 50 MCG/ACT nasal spray Place into both nostrils as needed for allergies or rhinitis. (Patient not taking: Reported on 11/19/2023)  No current facility-administered medications for this visit.    VITAL SIGNS: BP (!) 119/54   Pulse 73   Temp 98.6 F (37 C)   Resp 19   Wt 181 lb 1.6 oz (82.1 kg)   SpO2 96%   BMI 34.22 kg/m  Filed Weights   11/19/23 1215  Weight: 181 lb 1.6 oz (82.1 kg)    Estimated body mass index is 34.22 kg/m as calculated from the following:   Height as of 11/11/23: 5' 1 (1.549 m).   Weight as of this encounter: 181 lb 1.6 oz (82.1 kg).  LABS: CBC:    Component Value Date/Time   WBC 9.0 11/19/2023 1210   WBC 8.4 09/15/2023 1203   HGB 13.0 11/19/2023 1210   HGB 12.8 07/12/2017 1038   HCT 37.0 11/19/2023 1210   HCT 36.5 07/12/2017 1038   PLT 242 11/19/2023 1210   PLT 258 07/12/2017 1038   MCV 101.1 (H) 11/19/2023 1210   MCV 101 (H) 07/12/2017 1038   MCV 109 (H) 11/29/2013 0455   NEUTROABS 7.5 11/19/2023 1210   NEUTROABS 2.8 07/12/2017 1038   NEUTROABS 16.4 (H) 11/29/2013 0455   LYMPHSABS 1.1 11/19/2023 1210   LYMPHSABS 4.0 (H) 07/12/2017 1038   LYMPHSABS 1.4  11/29/2013 0455   MONOABS 0.3 11/19/2023 1210   MONOABS 0.3 11/29/2013 0455   EOSABS 0.0 11/19/2023 1210   EOSABS 0.9 (H) 07/12/2017 1038   EOSABS 0.0 11/29/2013 0455   BASOSABS 0.0 11/19/2023 1210   BASOSABS 0.0 07/12/2017 1038   BASOSABS 0.0 11/29/2013 0455   Comprehensive Metabolic Panel:    Component Value Date/Time   NA 127 (L) 11/19/2023 1210   NA 139 07/12/2017 1038   NA 138 11/29/2013 0455   K 4.6 11/19/2023 1210   K 3.9 11/29/2013 0455   CL 93 (L) 11/19/2023 1210   CL 105 11/29/2013 0455   CO2 25 11/19/2023 1210   CO2 23 11/29/2013 0455   BUN 22 11/19/2023 1210   BUN 13 07/12/2017 1038   BUN 18 11/29/2013 0455   CREATININE 1.14 (H) 11/19/2023 1210   CREATININE 1.46 (H) 11/29/2013 0455   GLUCOSE 111 (H) 11/19/2023 1210   GLUCOSE 165 (H) 11/29/2013 0455   CALCIUM  8.8 (L) 11/19/2023 1210   CALCIUM  8.6 11/29/2013 0455   AST 43 (H) 11/19/2023 1210   ALT 268 (H) 11/19/2023 1210   ALT 32 11/28/2013 0152   ALKPHOS 233 (H) 11/19/2023 1210   ALKPHOS 131 (H) 11/28/2013 0152   BILITOT 0.7 11/19/2023 1210   PROT 7.2 11/19/2023 1210   PROT 7.4 07/12/2017 1038   PROT 7.2 11/28/2013 0152   ALBUMIN 3.8 11/19/2023 1210   ALBUMIN 4.3 07/12/2017 1038   ALBUMIN 3.2 (L) 11/28/2013 0152    RADIOGRAPHIC STUDIES: IR IMAGING GUIDED PORT INSERTION Result Date: 11/11/2023 INDICATION: History of GYN malignancy.  Poor IV access.  Chemotherapy. EXAM: IMPLANTED PORT A CATH PLACEMENT WITH ULTRASOUND AND FLUOROSCOPIC GUIDANCE MEDICATIONS: None ANESTHESIA/SEDATION: Moderate (conscious) sedation was employed during this procedure. A total of Versed  2 mg and Fentanyl  100 mcg was administered intravenously. Moderate Sedation Time: 25 minutes. The patient's level of consciousness and vital signs were monitored continuously by radiology nursing throughout the procedure under my direct supervision. FLUOROSCOPY: Radiation Exposure Index and estimated peak skin dose (PSD); Reference air kerma (RAK), 1  mGy. COMPLICATIONS: None immediate. PROCEDURE: The procedure, risks, benefits, and alternatives were explained to the patient. Questions regarding the procedure were encouraged and answered. The patient understands and consents  to the procedure. The RIGHT neck and chest were prepped with chlorhexidine  in a sterile fashion, and a sterile drape was applied covering the operative field. Maximum barrier sterile technique with sterile gowns and gloves were used for the procedure. A timeout was performed prior to the initiation of the procedure. Local anesthesia was provided with 1% lidocaine  with epinephrine . After creating a small venotomy incision, a micropuncture kit was utilized to access the internal jugular vein under direct, real-time ultrasound guidance. Ultrasound image documentation was performed. The microwire was kinked to measure appropriate catheter length. A subcutaneous port pocket was then created along the upper chest wall utilizing a combination of sharp and blunt dissection. The pocket was irrigated with sterile saline. A single lumen power injectable port was chosen for placement. The 8 Fr catheter was tunneled from the port pocket site to the venotomy incision. The port was placed in the pocket. The external catheter was trimmed to appropriate length. At the venotomy, an 8 Fr peel-away sheath was placed over a guidewire under fluoroscopic guidance. The catheter was then placed through the sheath and the sheath was removed. Final catheter positioning was confirmed and documented with a fluoroscopic spot radiograph. The port was accessed with a Huber needle, aspirated and flushed with heparinized saline. The port pocket incision was closed with interrupted 3-0 Vicryl suture then Dermabond was applied, including at the venotomy incision. Dressings were placed. The patient tolerated the procedure well without immediate post procedural complication. IMPRESSION: Successful placement of a RIGHT internal  jugular approach power injectable Port-A-Cath. The tip of the catheter is positioned within the proximal RIGHT atrium. The catheter is ready for immediate use. Thom Hall, MD [facility] Electronically Signed   By: Thom Hall M.D.   On: 11/11/2023 12:06    PERFORMANCE STATUS (ECOG) : 1 - Symptomatic but completely ambulatory  Review of Systems Unless otherwise noted, a complete review of systems is negative.  Physical Exam General: NAD Cardiovascular: regular rate and rhythm Pulmonary: clear ant fields Abdomen: soft, nontender, + bowel sounds GU: no suprapubic tenderness Extremities: no edema, no joint deformities Skin: no rashes Neurological: nonfocal  IMPRESSION/PLAN: SCC of the vulva -on chemotherapy with cisplatin   Headache -patient without any acute neurological deficits.  Patient describes having headaches after abrupt discontinuation of caffeine.  Okay for her to resume caffeinated drinks in moderation.  May use oxycodone  as needed for breakthrough pain.    Hyponatremia  -this appears to be chronic but is slightly worse today.  Potentially could be a contributing factor to headaches.  Will add on hyponatremia workup including serum and urine osmolality and urine sodium and TSH for when patient returns next week.  Transaminitis -suspect secondary to chemotherapy.  Discussed with Dr. Melanee and will and ultrasound of the liver and acute hepatitis panel.  MD follow-up next week  Patient expressed understanding and was in agreement with this plan. She also understands that She can call clinic at any time with any questions, concerns, or complaints.   Thank you for allowing me to participate in the care of this very pleasant patient.   Time Total: 15 minutes  Visit consisted of counseling and education dealing with the complex and emotionally intense issues of symptom management in the setting of serious illness.Greater  than 50%  of this time was spent counseling and coordinating care related to the above assessment and plan.  Signed by: Fonda Mower, PhD, NP-C

## 2023-11-19 NOTE — Telephone Encounter (Addendum)
 Patient called stating that she has had a terrible headache all week. She would like to know what she can take for this, and has been told that she cannot take Tylenol  due to her liver function.  Please advice the patient on what she can take.

## 2023-11-19 NOTE — Addendum Note (Signed)
 Addended by: SHERLEEN FONDA SAUNDERS on: 11/19/2023 02:35 PM   Modules accepted: Orders

## 2023-11-19 NOTE — Patient Instructions (Signed)

## 2023-11-22 ENCOUNTER — Ambulatory Visit

## 2023-11-22 ENCOUNTER — Encounter: Payer: Self-pay | Admitting: Internal Medicine

## 2023-11-22 ENCOUNTER — Other Ambulatory Visit: Payer: Self-pay

## 2023-11-22 ENCOUNTER — Encounter: Payer: Self-pay | Admitting: Oncology

## 2023-11-22 ENCOUNTER — Inpatient Hospital Stay

## 2023-11-22 ENCOUNTER — Ambulatory Visit
Admission: RE | Admit: 2023-11-22 | Discharge: 2023-11-22 | Disposition: A | Discharge status: Home / Self Care | Source: Ambulatory Visit | Attending: Radiation Oncology | Admitting: Radiation Oncology

## 2023-11-22 ENCOUNTER — Inpatient Hospital Stay (HOSPITAL_BASED_OUTPATIENT_CLINIC_OR_DEPARTMENT_OTHER): Admitting: Oncology

## 2023-11-22 VITALS — BP 139/70

## 2023-11-22 VITALS — BP 131/67 | HR 78 | Temp 96.5°F | Resp 19 | Ht 61.0 in | Wt 182.2 lb

## 2023-11-22 DIAGNOSIS — Z510 Encounter for antineoplastic radiation therapy: Secondary | ICD-10-CM | POA: Diagnosis not present

## 2023-11-22 DIAGNOSIS — C519 Malignant neoplasm of vulva, unspecified: Secondary | ICD-10-CM

## 2023-11-22 DIAGNOSIS — R7401 Elevation of levels of liver transaminase levels: Secondary | ICD-10-CM

## 2023-11-22 DIAGNOSIS — Z5111 Encounter for antineoplastic chemotherapy: Secondary | ICD-10-CM

## 2023-11-22 DIAGNOSIS — R7989 Other specified abnormal findings of blood chemistry: Secondary | ICD-10-CM

## 2023-11-22 DIAGNOSIS — E871 Hypo-osmolality and hyponatremia: Secondary | ICD-10-CM

## 2023-11-22 LAB — RAD ONC ARIA SESSION SUMMARY
Course Elapsed Days: 6
Plan Fractions Treated to Date: 5
Plan Prescribed Dose Per Fraction: 1.8 Gy
Plan Total Fractions Prescribed: 36
Plan Total Prescribed Dose: 64.8 Gy
Reference Point Dosage Given to Date: 9 Gy
Reference Point Session Dosage Given: 1.8 Gy
Session Number: 5

## 2023-11-22 LAB — CBC WITH DIFFERENTIAL (CANCER CENTER ONLY)
Abs Immature Granulocytes: 0.06 K/uL (ref 0.00–0.07)
Basophils Absolute: 0.1 K/uL (ref 0.0–0.1)
Basophils Relative: 1 %
Eosinophils Absolute: 0.5 K/uL (ref 0.0–0.5)
Eosinophils Relative: 6 %
HCT: 35.2 % — ABNORMAL LOW (ref 36.0–46.0)
Hemoglobin: 12.6 g/dL (ref 12.0–15.0)
Immature Granulocytes: 1 %
Lymphocytes Relative: 25 %
Lymphs Abs: 2.2 K/uL (ref 0.7–4.0)
MCH: 36 pg — ABNORMAL HIGH (ref 26.0–34.0)
MCHC: 35.8 g/dL (ref 30.0–36.0)
MCV: 100.6 fL — ABNORMAL HIGH (ref 80.0–100.0)
Monocytes Absolute: 0.8 K/uL (ref 0.1–1.0)
Monocytes Relative: 9 %
Neutro Abs: 5.3 K/uL (ref 1.7–7.7)
Neutrophils Relative %: 58 %
Platelet Count: 235 K/uL (ref 150–400)
RBC: 3.5 MIL/uL — ABNORMAL LOW (ref 3.87–5.11)
RDW: 11.8 % (ref 11.5–15.5)
WBC Count: 8.9 K/uL (ref 4.0–10.5)
nRBC: 0 % (ref 0.0–0.2)

## 2023-11-22 LAB — HEPATIC FUNCTION PANEL
ALT: 113 U/L — ABNORMAL HIGH (ref 0–44)
AST: 32 U/L (ref 15–41)
Albumin: 3.6 g/dL (ref 3.5–5.0)
Alkaline Phosphatase: 179 U/L — ABNORMAL HIGH (ref 38–126)
Bilirubin, Direct: <0.1 mg/dL (ref 0.0–0.2)
Total Bilirubin: 0.6 mg/dL (ref 0.0–1.2)
Total Protein: 6.7 g/dL (ref 6.5–8.1)

## 2023-11-22 LAB — BASIC METABOLIC PANEL - CANCER CENTER ONLY
Anion gap: 9 (ref 5–15)
BUN: 16 mg/dL (ref 8–23)
CO2: 28 mmol/L (ref 22–32)
Calcium: 8.4 mg/dL — ABNORMAL LOW (ref 8.9–10.3)
Chloride: 92 mmol/L — ABNORMAL LOW (ref 98–111)
Creatinine: 1.26 mg/dL — ABNORMAL HIGH (ref 0.44–1.00)
GFR, Estimated: 47 mL/min — ABNORMAL LOW (ref >60)
Glucose, Bld: 102 mg/dL — ABNORMAL HIGH (ref 70–99)
Potassium: 3.9 mmol/L (ref 3.5–5.1)
Sodium: 129 mmol/L — ABNORMAL LOW (ref 135–145)

## 2023-11-22 LAB — HEPATITIS PANEL, ACUTE
HCV Ab: NONREACTIVE
Hep A IgM: NONREACTIVE
Hep B C IgM: NONREACTIVE
Hepatitis B Surface Ag: NONREACTIVE

## 2023-11-22 LAB — OSMOLALITY: Osmolality: 277 mosm/kg (ref 275–295)

## 2023-11-22 LAB — OSMOLALITY, URINE: Osmolality, Ur: 177 mosm/kg — ABNORMAL LOW (ref 300–900)

## 2023-11-22 LAB — MAGNESIUM: Magnesium: 1.9 mg/dL (ref 1.7–2.4)

## 2023-11-22 LAB — TSH: TSH: 14.152 u[IU]/mL — ABNORMAL HIGH (ref 0.350–4.500)

## 2023-11-22 MED ORDER — SODIUM CHLORIDE 0.9 % IV SOLN
150.0000 mg | Freq: Once | INTRAVENOUS | Status: AC
  Administered 2023-11-22: 150 mg via INTRAVENOUS
  Filled 2023-11-22: qty 150

## 2023-11-22 MED ORDER — POTASSIUM CHLORIDE IN NACL 20-0.9 MEQ/L-% IV SOLN
Freq: Once | INTRAVENOUS | Status: AC
  Filled 2023-11-22: qty 1000

## 2023-11-22 MED ORDER — DEXAMETHASONE SODIUM PHOSPHATE 10 MG/ML IJ SOLN
10.0000 mg | Freq: Once | INTRAMUSCULAR | Status: AC
  Administered 2023-11-22: 10 mg via INTRAVENOUS
  Filled 2023-11-22: qty 1

## 2023-11-22 MED ORDER — SODIUM CHLORIDE 0.9 % IV SOLN
40.0000 mg/m2 | Freq: Once | INTRAVENOUS | Status: AC
  Administered 2023-11-22: 76 mg via INTRAVENOUS
  Filled 2023-11-22: qty 76

## 2023-11-22 MED ORDER — PALONOSETRON HCL INJECTION 0.25 MG/5ML
0.2500 mg | Freq: Once | INTRAVENOUS | Status: AC
  Administered 2023-11-22: 0.25 mg via INTRAVENOUS
  Filled 2023-11-22: qty 5

## 2023-11-22 MED ORDER — TRAZODONE HCL 50 MG PO TABS: 50.0000 mg | ORAL_TABLET | Freq: Every day | ORAL | 0 refills | Status: DC

## 2023-11-22 MED ORDER — MAGNESIUM SULFATE 2 GM/50ML IV SOLN
2.0000 g | Freq: Once | INTRAVENOUS | Status: AC
  Administered 2023-11-22: 2 g via INTRAVENOUS
  Filled 2023-11-22: qty 50

## 2023-11-22 MED ORDER — SODIUM CHLORIDE 0.9 % IV SOLN
INTRAVENOUS | Status: DC
  Filled 2023-11-22: qty 250

## 2023-11-22 NOTE — Progress Notes (Signed)
 Hematology/Oncology Consult note [facility]  [phone] [phone]  Patient Care Team: Entzminger, Ethridge LABOR, MD as PCP - General (Internal Medicine) [name] Shasta POUR, RN as Oncology Nurse Navigator Maurie Rayfield BIRCH, RN as Oncology Nurse Navigator [name], MD as Consulting Physician (Oncology) Lenn Aran, MD as Consulting Physician (Radiation Oncology)   Name of the patient: Patricia Good  [id number]  June 20, 1960   Date of visit: 11/22/23  Diagnosis- 1.  History of breast cancer in remission 2.  Locally advanced vulvar carcinoma  Chief complaint/ Reason for visit-on treatment assessment prior to cycle 2 of weekly cisplatin  chemotherapy  Heme/Onc history: Patient is a 63 year old female with history of stage I left breast cancer ER/PR positive HER2 negative.  She underwent lumpectomy and sentinel lymph node biopsy.  She declined adjuvant chemotherapy despite her recurrence score of 28.  She also stopped radiation therapy prematurely.She is presently on letrozole  for endocrine therapy.  She has baseline osteoporosis for which she is on Fosamax.   She was seen by infectious disease for herpetic like lesions but overall lesions were concerning for malignancy and patient was seen by Dr. Lovetta April 2025.  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    PET CT scan on 09/10/2023 showed: 1. Superficial right vulvar lesion measuring up to 3.3 cm in length with maximum SUV 13.6, compatible with malignancy. 2. Small amount of accentuated activity along the right upper medial thigh just below the crease with the pubis, with associated soft tissue density measuring about 0.7 by 1.7 cm, with maximum SUV  7.7, concerning for malignancy. 3. Right inguinal and right external iliac lymph nodes with maximum above blood pool, suspicious for metastatic disease.   Plan is for weekly cisplatin  for concurrent chemoradiation  Interval history-she reports having trouble sleeping at night.  Otherwise tolerating treatments well so far  ECOG PS- 1 Pain scale- 0   Review of systems- Review of Systems  Constitutional:  Negative for chills, fever, malaise/fatigue and weight loss.  HENT:  Negative for congestion, ear discharge and nosebleeds.   Eyes:  Negative for blurred vision.  Respiratory:  Negative for cough, hemoptysis, sputum production, shortness of breath and wheezing.   Cardiovascular:  Negative for chest pain, palpitations, orthopnea and claudication.  Gastrointestinal:  Negative for abdominal pain, blood in stool, constipation, diarrhea, heartburn, melena, nausea and vomiting.  Genitourinary:  Negative for dysuria, flank pain, frequency, hematuria and urgency.  Musculoskeletal:  Negative for back pain, joint pain and myalgias.  Skin:  Negative for rash.  Neurological:  Negative for dizziness, tingling, focal weakness, seizures, weakness and headaches.  Endo/Heme/Allergies:  Does not bruise/bleed easily.  Psychiatric/Behavioral:  Negative for depression and suicidal ideas. The patient has insomnia.       Allergies  Allergen Reactions   Shellfish Allergy Anaphylaxis    Only to crab meat (non-imitation), not shrimp   Sulfasalazine Anaphylaxis and Hives    Other reaction(s): Other (See Comments)   Lisinopril     unknown     Past Medical History:  Diagnosis Date   Abnormal thyroid  stimulating hormone (TSH) level    Anemia    Anxiety    Asthma    Bipolar 1 disorder (HCC)    COPD (chronic obstructive pulmonary disease) (HCC)    Endometriosis    GERD (gastroesophageal  reflux disease)    Hepatitis A    Hypertension    Hypothyroidism    Malignant neoplasm of upper-inner quadrant  of left breast in female, 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   Personal history of radiation therapy    Pre-diabetes    PTSD (post-traumatic stress disorder)    Schizoaffective disorder [facility])      Past Surgical History:  Procedure Laterality Date   BREAST BIOPSY Left 03/03/2022   Left breast stereo bx Ribbon Clip- path pending   BREAST BIOPSY Left 03/03/2022   MM LT BREAST BX W LOC DEV 1ST LESION IMAGE BX SPEC STEREO GUIDE 03/03/2022 [facility]   BREAST LUMPECTOMY Left 03/20/2022   ECTOPIC PREGNANCY SURGERY     IR IMAGING GUIDED PORT INSERTION  11/11/2023   PART MASTECTOMY,RADIO FREQUENCY LOCALIZER,AXILLARY SENTINEL NODE BIOPSY Left 03/20/2022   Procedure: PART MASTECTOMY,RADIO FREQUENCY LOCALIZER,AXILLARY SENTINEL NODE BIOPSY;  Surgeon: Rodolph Romano, MD;  Location: [facility] ORS;  Service: General;  Laterality: Left;   TOTAL ABDOMINAL HYSTERECTOMY W/ BILATERAL SALPINGOOPHORECTOMY  1990    Social History   Socioeconomic History   Marital status: Single    Spouse name: Not on file   Number of children: 0   Years of education: Not on file   Highest education level: Associate degree: occupational, Scientist, product/process development, or vocational program  Occupational History   Not on file  Tobacco Use   Smoking status: Every Day    Current packs/day: 0.50    Average packs/day: 0.5 packs/day for 54.8 years (27.4 ttl pk-yrs)    Types: Cigarettes    Start date: 01/22/1969   Smokeless tobacco: Never   Tobacco comments:    Continues to go down on number per day to 5-7 per day.   Vaping Use   Vaping status: Never Used  Substance and Sexual Activity   Alcohol use: Not Currently    Alcohol/week: 1.0 - 2.0 standard drink of alcohol    Types: 1 Glasses of wine per week    Comment: occasional   Drug use: No   Sexual activity: Not Currently    Birth control/protection: None  Other Topics Concern   Not on file  Social History Narrative   Lives alone   Social Drivers of Health    Financial Resource Strain: Medium Risk (08/03/2023)   Received from [facility]   Overall Financial Resource Strain (CARDIA)    Difficulty of Paying Living Expenses: Somewhat hard  Food Insecurity: No Food Insecurity (08/19/2023)   Hunger Vital Sign    Worried About Running Out of Food in the Last Year: Never true    Ran Out of Food in the Last Year: Never true  Transportation Needs: No Transportation Needs (08/19/2023)   PRAPARE - Administrator, Civil Service (Medical): No    Lack of Transportation (Non-Medical): No  Physical Activity: Inactive (06/15/2017)   Exercise Vital Sign    Days of Exercise per Week: 0 days    Minutes of Exercise per Session: 0 min  Stress: Stress Concern Present (06/15/2017)   [employer] of Occupational Health - Occupational Stress Questionnaire    Feeling of Stress : Very much  Social Connections: Socially Isolated (06/15/2017)   Social Connection and Isolation Panel    Frequency of Communication with Friends and Family: Never    Frequency of Social Gatherings with Friends and Family: Never    Attends Religious Services: Never    Database administrator or Organizations: No    Attends [employer]  or Organization Meetings: Never    Marital Status: Divorced  Catering manager Violence: Not At Risk (08/19/2023)   Humiliation, Afraid, Rape, and Kick questionnaire    Fear of Current or Ex-Partner: No    Emotionally Abused: No    Physically Abused: No    Sexually Abused: No    Family History  Problem Relation Age of Onset   Hypertension Father    Diabetes Father    Heart disease Father    Blindness Father    Alcohol abuse Sister    Drug abuse Sister    Anxiety disorder Sister    Depression Sister    Obesity Brother    Arthritis Brother    Alcohol abuse Brother    Drug abuse Brother    Depression Brother    Breast cancer Cousin    Clotting disorder Neg Hx      Current Outpatient Medications:    albuterol  (VENTOLIN   HFA) 108 (90 Base) MCG/ACT inhaler, 2 puffs Inhalation every 4-6 hrs for 30 days for shortness of breath or wheezing, Disp: , Rfl:    alendronate (FOSAMAX) 70 MG tablet, Take 70 mg by mouth once a week., Disp: , Rfl:    amLODipine  (NORVASC ) 10 MG tablet, 1 tablet Orally Once a day for 100 days for high blood pressure, Disp: , Rfl:    atorvastatin (LIPITOR) 20 MG tablet, Take 20 mg by mouth at bedtime., Disp: , Rfl:    buPROPion  (WELLBUTRIN  XL) 150 MG 24 hr tablet, TAKE 1 TABLET BY MOUTH DAILY ALONG WITH 300 MG TABLET FOR TOTAL OF 450 MG DAILY, Disp: 90 tablet, Rfl: 1   buPROPion  (WELLBUTRIN  XL) 300 MG 24 hr tablet, Take 1 tablet (300 mg total) by mouth daily. Take along with 150 mg daily , total of 450 mg daily, Disp: 90 tablet, Rfl: 1   Calcium  Carb-Cholecalciferol 500-10 MG-MCG TABS, Take 1 tablet by mouth., Disp: , Rfl:    calcium -vitamin D  (OSCAL WITH D) 500-200 MG-UNIT tablet, Take 1 tablet by mouth daily with breakfast., Disp: , Rfl:    clonazePAM  (KLONOPIN ) 0.5 MG tablet, Take 1 tablet (0.5 mg total) by mouth daily as needed for anxiety. Please use it only for severe panic attacks, do not combine with Oxycodone , Disp: 15 tablet, Rfl: 0   furosemide  (LASIX ) 20 MG tablet, 1 tablet Orally Once a day for 100 days high blood pressure, Disp: , Rfl:    hydrOXYzine  (ATARAX ) 10 MG tablet, Take 2 tablets (20 mg total) by mouth at bedtime as needed. for sleep and anxiety, Disp: 180 tablet, Rfl: 0   letrozole  (FEMARA ) 2.5 MG tablet, Take 1 tablet (2.5 mg total) by mouth daily., Disp: 30 tablet, Rfl: 3   levothyroxine  (SYNTHROID , LEVOTHROID) 112 MCG tablet, , Disp: , Rfl:    lidocaine -prilocaine  (EMLA ) cream, Apply 1 Application topically as needed. Apply a small amount to port site 1 hour before chemotherapy, Disp: 30 g, Rfl: 2   OLANZapine  (ZYPREXA ) 5 MG tablet, Take 1 tablet (5 mg total) by mouth at bedtime., Disp: 90 tablet, Rfl: 3   omeprazole (PRILOSEC) 40 MG capsule, Take 40 mg by mouth daily. ,  Disp: , Rfl:    ondansetron  (ZOFRAN ) 8 MG tablet, Take 1 tablet (8 mg total) by mouth every 8hrs as needed for nausea or vomiting. Start on the third day after cisplatin ., Disp: 30 tablet, Rfl: 1   oxyCODONE  (OXY IR/ROXICODONE ) 5 MG immediate release tablet, Take 0.5-1 tablets (2.5-5 mg total) by mouth every 8 (eight)  hours as needed for severe pain (pain score 7-10) (for pain unrelieved by tylenol )., Disp: 30 tablet, Rfl: 0   potassium chloride  SA (KLOR-CON  M) 20 MEQ tablet, Take 1 tablet (20 mEq total) by mouth daily. For low potassium, Disp: 30 tablet, Rfl: 0   prazosin  (MINIPRESS ) 2 MG capsule, TAKE ONE CAPSULE BY MOUTH AT BEDTIME., Disp: 90 capsule, Rfl: 1   prochlorperazine  (COMPAZINE ) 10 MG tablet, Take 1 tablet (10 mg total) by mouth every 6 (six) hours as needed for nausea or vomiting., Disp: 30 tablet, Rfl: 1   tiotropium (SPIRIVA ) 18 MCG inhalation capsule, Place 18 mcg into inhaler and inhale daily., Disp: , Rfl:    traZODone  (DESYREL ) 50 MG tablet, Take 1 tablet (50 mg total) by mouth at bedtime., Disp: 30 tablet, Rfl: 0   dexamethasone  (DECADRON ) 4 MG tablet, Take 2 tablets daily x 3 days starting the day after chemotherapy. Take with food., Disp: 30 tablet, Rfl: 1   fluticasone  (FLONASE ) 50 MCG/ACT nasal spray, Place into both nostrils as needed for allergies or rhinitis. (Patient not taking: Reported on 11/22/2023), Disp: , Rfl:  No current facility-administered medications for this visit.  Facility-Administered Medications Ordered in Other Visits:    0.9 %  sodium chloride  infusion, , Intravenous, Continuous, [name], MD, Last Rate: 10 mL/hr at 11/22/23 1240, New Bag at 11/22/23 1240  Physical exam:  Vitals:   11/22/23 0903  BP: 131/67  Pulse: 78  Resp: 19  Temp: (!) 96.5 F (35.8 C)  TempSrc: Tympanic  SpO2: 97%  Weight: 182 lb 3.2 oz (82.6 kg)  Height: 5' 1 (1.549 m)   Physical Exam Cardiovascular:     Rate and Rhythm: Normal rate and regular rhythm.     Heart  sounds: Normal heart sounds.  Pulmonary:     Effort: Pulmonary effort is normal.     Breath sounds: Normal breath sounds.  Abdominal:     General: Bowel sounds are normal.     Palpations: Abdomen is soft.  Skin:    General: Skin is warm and dry.  Neurological:     Mental Status: She is alert and oriented to person, place, and time.      I have personally reviewed labs listed below:    Latest Ref Rng & Units 11/22/2023    8:34 AM  CMP  Glucose 70 - 99 mg/dL 897   BUN 8 - 23 mg/dL 16   Creatinine 9.55 - 1.00 mg/dL 8.73   Sodium 864 - 854 mmol/L 129   Potassium 3.5 - 5.1 mmol/L 3.9   Chloride 98 - 111 mmol/L 92   CO2 22 - 32 mmol/L 28   Calcium  8.9 - 10.3 mg/dL 8.4   Total Protein 6.5 - 8.1 g/dL 6.7   Total Bilirubin 0.0 - 1.2 mg/dL 0.6   Alkaline Phos 38 - 126 U/L 179   AST 15 - 41 U/L 32   ALT 0 - 44 U/L 113       Latest Ref Rng & Units 11/22/2023    8:34 AM  CBC  WBC 4.0 - 10.5 K/uL 8.9   Hemoglobin 12.0 - 15.0 g/dL 87.3   Hematocrit 63.9 - 46.0 % 35.2   Platelets 150 - 400 K/uL 235    I have personally reviewed Radiology images listed below: No images are attached to the encounter.  IR IMAGING GUIDED PORT INSERTION Result Date: 11/11/2023 INDICATION: History of GYN malignancy.  Poor IV access.  Chemotherapy. EXAM: IMPLANTED PORT A  CATH PLACEMENT WITH ULTRASOUND AND FLUOROSCOPIC GUIDANCE MEDICATIONS: None ANESTHESIA/SEDATION: Moderate (conscious) sedation was employed during this procedure. A total of Versed  2 mg and Fentanyl  100 mcg was administered intravenously. Moderate Sedation Time: 25 minutes. The patient's level of consciousness and vital signs were monitored continuously by radiology nursing throughout the procedure under my direct supervision. FLUOROSCOPY: Radiation Exposure Index and estimated peak skin dose (PSD); Reference air kerma (RAK), 1 mGy. COMPLICATIONS: None immediate. PROCEDURE: The procedure, risks, benefits, and alternatives were explained to the  patient. Questions regarding the procedure were encouraged and answered. The patient understands and consents to the procedure. The RIGHT neck and chest were prepped with chlorhexidine  in a sterile fashion, and a sterile drape was applied covering the operative field. Maximum barrier sterile technique with sterile gowns and gloves were used for the procedure. A timeout was performed prior to the initiation of the procedure. Local anesthesia was provided with 1% lidocaine  with epinephrine . After creating a small venotomy incision, a micropuncture kit was utilized to access the internal jugular vein under direct, real-time ultrasound guidance. Ultrasound image documentation was performed. The microwire was kinked to measure appropriate catheter length. A subcutaneous port pocket was then created along the upper chest wall utilizing a combination of sharp and blunt dissection. The pocket was irrigated with sterile saline. A single lumen power injectable port was chosen for placement. The 8 Fr catheter was tunneled from the port pocket site to the venotomy incision. The port was placed in the pocket. The external catheter was trimmed to appropriate length. At the venotomy, an 8 Fr peel-away sheath was placed over a guidewire under fluoroscopic guidance. The catheter was then placed through the sheath and the sheath was removed. Final catheter positioning was confirmed and documented with a fluoroscopic spot radiograph. The port was accessed with a Huber needle, aspirated and flushed with heparinized saline. The port pocket incision was closed with interrupted 3-0 Vicryl suture then Dermabond was applied, including at the venotomy incision. Dressings were placed. The patient tolerated the procedure well without immediate post procedural complication. IMPRESSION: Successful placement of a RIGHT internal jugular approach power injectable Port-A-Cath. The tip of the catheter is positioned within the proximal RIGHT atrium.  The catheter is ready for immediate use. Thom Hall, MD [facility] Radiology Electronically Signed   By: Thom Hall M.D.   On: 11/11/2023 12:06     Assessment and plan- Patient is a 63 y.o. 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  She is here for on treatment assessment prior to cycle 2 of weekly cisplatin  chemotherapy  Patient has baseline CKD with a creatinine that fluctuates between 1.1-1.2.  Okay to proceed with cycle 2 of weekly cisplatin  chemotherapy today.  She will directly proceed for cycle 3 next week and I will see her back in 2 weeks for cycle 4.   Abnormal LFTs: They appear improved today as compared to 3 days ago.  Hepatitis panel testing pending .  She is scheduled for an ultrasound tomorrow.  Unlikely secondary to chemotherapy as she had abnormal LFTs even prior to starting chemotherapy  Insomnia: I am starting her on trazodone  50 mg nightly  Hyponatremia: Serum osmolarity is low normal and urine osmolality is low.Not suggestive of SIADH.  She will continue to receive pre and post cisplatin  fluids and we will continue to monitor for now   Visit Diagnosis 1. Vulvar cancer (HCC)  2. Squamous cell carcinoma of vulva (HCC)   3. Abnormal LFTs   4. Encounter for antineoplastic chemotherapy   5. Hyponatremia      Dr. Annah Skene, MD, MPH [facility] [phone] 11/22/2023 2:32 PM

## 2023-11-22 NOTE — Progress Notes (Signed)
 Patient called regarding-constipation/blood in stool also vaginal itching.  Recommend MiraLAX and also Benadryl.  If not improved reach out to her oncologist morning.

## 2023-11-22 NOTE — Patient Instructions (Signed)
 [facility] - A DEPT OF [facility]  Discharge Instructions: Thank you for choosing [facility] to provide your oncology and hematology care.  If you have a lab appointment with the Cancer Center, please go directly to the Cancer Center and check in at the registration area.  Wear comfortable clothing and clothing appropriate for easy access to any Portacath or PICC line.   We strive to give you quality time with your provider. You may need to reschedule your appointment if you arrive late (15 or more minutes).  Arriving late affects you and other patients whose appointments are after yours.  Also, if you miss three or more appointments without notifying the office, you may be dismissed from the clinic at the provider's discretion.      For prescription refill requests, have your pharmacy contact our office and allow 72 hours for refills to be completed.    Today you received the following chemotherapy and/or immunotherapy agents CISPLATIN      To help prevent nausea and vomiting after your treatment, we encourage you to take your nausea medication as directed.  BELOW ARE SYMPTOMS THAT SHOULD BE REPORTED IMMEDIATELY: *FEVER GREATER THAN 100.4 F (38 C) OR HIGHER *CHILLS OR SWEATING *NAUSEA AND VOMITING THAT IS NOT CONTROLLED WITH YOUR NAUSEA MEDICATION *UNUSUAL SHORTNESS OF BREATH *UNUSUAL BRUISING OR BLEEDING *URINARY PROBLEMS (pain or burning when urinating, or frequent urination) *BOWEL PROBLEMS (unusual diarrhea, constipation, pain near the anus) TENDERNESS IN MOUTH AND THROAT WITH OR WITHOUT PRESENCE OF ULCERS (sore throat, sores in mouth, or a toothache) UNUSUAL RASH, SWELLING OR PAIN  UNUSUAL VAGINAL DISCHARGE OR ITCHING   Items with * indicate a potential emergency and should be followed up as soon as possible or go to the Emergency Department if any problems should occur.  Please show the CHEMOTHERAPY ALERT CARD or IMMUNOTHERAPY  ALERT CARD at check-in to the Emergency Department and triage nurse.  Should you have questions after your visit or need to cancel or reschedule your appointment, please contact [facility] - A DEPT OF [facility]  [phone] and follow the prompts.  Office hours are 8:00 a.m. to 4:30 p.m. Monday - Friday. Please note that voicemails left after 4:00 p.m. may not be returned until the following business day.  We are closed weekends and major holidays. You have access to a nurse at all times for urgent questions. Please call the main number to the clinic [phone] and follow the prompts.  For any non-urgent questions, you may also contact your provider using MyChart. We now offer e-Visits for anyone 49 and older to request care online for non-urgent symptoms. For details visit [email or web address].   Also download the MyChart app! Go to the app store, search "MyChart", open the app, select [facility], and log in with your MyChart username and password.  Cisplatin Injection What is this medication? CISPLATIN (SIS pla tin) treats some types of cancer. It works by slowing down the growth of cancer cells. This medicine may be used for other purposes; ask your health care provider or pharmacist if you have questions. COMMON BRAND NAME(S): Platinol, Platinol -AQ What should I tell my care team before I take this medication? They need to know if you have any of these conditions: Eye disease, vision problems Hearing problems Kidney disease Low blood counts, such as low white cells, platelets, or red blood cells Tingling of the fingers  or toes, or other nerve disorder An unusual or allergic reaction to cisplatin, carboplatin, oxaliplatin, other medications, foods, dyes, or preservatives If you or your partner are pregnant or trying to get pregnant Breast-feeding How should I use this medication? This medication is injected into a vein. It is given by your care  team in a hospital or clinic setting. Talk to your care team about the use of this medication in children. Special care may be needed. Overdosage: If you think you have taken too much of this medicine contact a poison control center or emergency room at once. NOTE: This medicine is only for you. Do not share this medicine with others. What if I miss a dose? Keep appointments for follow-up doses. It is important not to miss your dose. Call your care team if you are unable to keep an appointment. What may interact with this medication? Do not take this medication with any of the following: Live virus vaccines This medication may also interact with the following: Certain antibiotics, such as amikacin, gentamicin, neomycin, polymyxin B, streptomycin, tobramycin, vancomycin Foscarnet This list may not describe all possible interactions. Give your health care provider a list of all the medicines, herbs, non-prescription drugs, or dietary supplements you use. Also tell them if you smoke, drink alcohol, or use illegal drugs. Some items may interact with your medicine. What should I watch for while using this medication? Your condition will be monitored carefully while you are receiving this medication. You may need blood work done while taking this medication. This medication may make you feel generally unwell. This is not uncommon, as chemotherapy can affect healthy cells as well as cancer cells. Report any side effects. Continue your course of treatment even though you feel ill unless your care team tells you to stop. This medication may increase your risk of getting an infection. Call your care team for advice if you get a fever, chills, sore throat, or other symptoms of a cold or flu. Do not treat yourself. Try to avoid being around people who are sick. Avoid taking medications that contain aspirin, acetaminophen, ibuprofen, naproxen, or ketoprofen unless instructed by your care team. These medications  may hide a fever. This medication may increase your risk to bruise or bleed. Call your care team if you notice any unusual bleeding. Be careful brushing or flossing your teeth or using a toothpick because you may get an infection or bleed more easily. If you have any dental work done, tell your dentist you are receiving this medication. Drink fluids as directed while you are taking this medication. This will help protect your kidneys. Call your care team if you get diarrhea. Do not treat yourself. Talk to your care team if you or your partner wish to become pregnant or think you might be pregnant. This medication can cause serious birth defects if taken during pregnancy and for 14 months after the last dose. A negative pregnancy test is required before starting this medication. A reliable form of contraception is recommended while taking this medication and for 14 months after the last dose. Talk to your care team about effective forms of contraception. Do not father a child while taking this medication and for 11 months after the last dose. Use a condom during sex during this time period. Do not breast-feed while taking this medication. This medication may cause infertility. Talk to your care team if you are concerned about your fertility. What side effects may I notice from receiving this medication? Side  effects that you should report to your care team as soon as possible: Allergic reactions--skin rash, itching, hives, swelling of the face, lips, tongue, or throat Eye pain, change in vision, vision loss Hearing loss, ringing in ears Infection--fever, chills, cough, sore throat, wounds that don't heal, pain or trouble when passing urine, general feeling of discomfort or being unwell Kidney injury--decrease in the amount of urine, swelling of the ankles, hands, or feet Low red blood cell level--unusual weakness or fatigue, dizziness, headache, trouble breathing Painful swelling, warmth, or redness  of the skin, blisters or sores at the infusion site Pain, tingling, or numbness in the hands or feet Unusual bruising or bleeding Side effects that usually do not require medical attention (report to your care team if they continue or are bothersome): Hair loss Nausea Vomiting This list may not describe all possible side effects. Call your doctor for medical advice about side effects. You may report side effects to FDA at 1-800-FDA-1088. Where should I keep my medication? This medication is given in a hospital or clinic. It will not be stored at home. NOTE: This sheet is a summary. It may not cover all possible information. If you have questions about this medicine, talk to your doctor, pharmacist, or health care provider.  2024 Elsevier/Gold Standard (2021-07-25 00:00:00)

## 2023-11-22 NOTE — Progress Notes (Signed)
 Patient state she's restless as she didn't get any sleep over the weekend and would like to know if she can take a medication to help her sleep at night. She also states she's having pain in between her legs which she would like insight about as well.

## 2023-11-23 ENCOUNTER — Telehealth: Payer: Self-pay

## 2023-11-23 ENCOUNTER — Ambulatory Visit

## 2023-11-23 ENCOUNTER — Ambulatory Visit
Admission: RE | Admit: 2023-11-23 | Discharge: 2023-11-23 | Disposition: A | Discharge status: Home / Self Care | Source: Ambulatory Visit | Attending: Radiation Oncology | Admitting: Radiation Oncology

## 2023-11-23 ENCOUNTER — Other Ambulatory Visit: Payer: Self-pay

## 2023-11-23 ENCOUNTER — Telehealth: Payer: Self-pay | Admitting: *Deleted

## 2023-11-23 ENCOUNTER — Ambulatory Visit
Admission: RE | Admit: 2023-11-23 | Discharge: 2023-11-23 | Disposition: A | Discharge status: Home / Self Care | Source: Ambulatory Visit | Attending: Hospice and Palliative Medicine | Admitting: Hospice and Palliative Medicine

## 2023-11-23 ENCOUNTER — Telehealth: Payer: Self-pay | Admitting: Pharmacy Technician

## 2023-11-23 DIAGNOSIS — R7401 Elevation of levels of liver transaminase levels: Secondary | ICD-10-CM | POA: Diagnosis present

## 2023-11-23 DIAGNOSIS — Z510 Encounter for antineoplastic radiation therapy: Secondary | ICD-10-CM | POA: Diagnosis not present

## 2023-11-23 DIAGNOSIS — C519 Malignant neoplasm of vulva, unspecified: Secondary | ICD-10-CM

## 2023-11-23 LAB — RAD ONC ARIA SESSION SUMMARY
Course Elapsed Days: 7
Plan Fractions Treated to Date: 6
Plan Prescribed Dose Per Fraction: 1.8 Gy
Plan Total Fractions Prescribed: 36
Plan Total Prescribed Dose: 64.8 Gy
Reference Point Dosage Given to Date: 10.8 Gy
Reference Point Session Dosage Given: 1.8 Gy
Session Number: 6

## 2023-11-23 LAB — T4, FREE: Free T4: 0.91 ng/dL (ref 0.61–1.12)

## 2023-11-23 NOTE — Telephone Encounter (Signed)
 I just started her a couple of days ago on this dose. I would like her to take it for a week and see how she is doing before going up to 100 mg. She is on other psychoactive meds as well

## 2023-11-23 NOTE — Telephone Encounter (Signed)
 Received live call from Patricia Good. She states she was prescribed Trazodone  50mg  at bedtime to help her sleep. Reports this is only helping for a couple of hours and she is exhausted. She is asking if she can increase dose to 100mg  at bedtime. Message routed to Dr. Melanee.

## 2023-11-23 NOTE — Progress Notes (Signed)
Unspecified

## 2023-11-23 NOTE — Telephone Encounter (Signed)
 Patient requested reimbursement for rent from [employer].  Provided a W-9 form for patient to have landlord complete.  Patient returned completed W-9 form.  However, it did not contain her landlord's Tax ID#.  Informed patient that we need this information from landlord in order to process her request.  Patient provided me with phone number for landlord and asked that I contact landlord.  I spoke with Christopher Novak, her landlord and explained why I needed the Tax ID#.  Mr. Novak stated that he was going to contact his account and get back in touch with me.  I made patient aware that I had spoken with Mr. Novak and reiterated that without his Tax ID# we would be unable to use the grant funds to pay the rent.  Patient verbally acknowledged that she understood and also stated that she does not have any other bills to submit for payment at this time.  Patricia Good Patient Services Navigator [facility]

## 2023-11-23 NOTE — Telephone Encounter (Signed)
 Patient would like her trazodone  increased.

## 2023-11-23 NOTE — Telephone Encounter (Addendum)
 Called and notified Ms. Patricia Good that she needs to take this dose for a week prior to us  making any changes. She was instructed to make us  aware after this time frame if dose continues to be ineffective.

## 2023-11-23 NOTE — Telephone Encounter (Signed)
 We just started it yesterday. I cannot increase it within a couple of days. We will assess next week

## 2023-11-24 ENCOUNTER — Telehealth: Payer: Self-pay | Admitting: *Deleted

## 2023-11-24 ENCOUNTER — Inpatient Hospital Stay

## 2023-11-24 ENCOUNTER — Ambulatory Visit
Admission: RE | Admit: 2023-11-24 | Discharge: 2023-11-24 | Disposition: A | Discharge status: Home / Self Care | Source: Ambulatory Visit | Attending: Radiation Oncology | Admitting: Radiation Oncology

## 2023-11-24 ENCOUNTER — Other Ambulatory Visit: Payer: Self-pay

## 2023-11-24 ENCOUNTER — Ambulatory Visit

## 2023-11-24 DIAGNOSIS — Z510 Encounter for antineoplastic radiation therapy: Secondary | ICD-10-CM | POA: Diagnosis not present

## 2023-11-24 LAB — RAD ONC ARIA SESSION SUMMARY
Course Elapsed Days: 8
Plan Fractions Treated to Date: 7
Plan Prescribed Dose Per Fraction: 1.8 Gy
Plan Total Fractions Prescribed: 36
Plan Total Prescribed Dose: 64.8 Gy
Reference Point Dosage Given to Date: 12.6 Gy
Reference Point Session Dosage Given: 1.8 Gy
Session Number: 7

## 2023-11-24 NOTE — Progress Notes (Signed)
 Nutrition Assessment   Reason for Assessment:   Referral from Palliative NP  ASSESSMENT:  63 year old female with stage III SCC of vulva currently receiving concurrent chemoradiation.  Past medical history of Breast cancer, lumpectomy, bipolar, GERD, Hep A, HTN, pre-DM, PTSD, schizoaffective disorder.  Receiving cisplatin  and radiation  Met with patient following radiation.  Patient reports that appetite is improved after starting treatment.  Prior to treatment appetite was down for about 6-7 months per patient.  Reports that she ate 1 strip of bacon this am and 3 eggs, Lunch was chicken with pea, carrots, onions and piece of bread with butter.  Last night ate salad with ritz crackers.  Does not drink oral nutrition supplements. Having issues with constipation but taking dulcolax and planning to get miralax.  Drinks mainly sweet tea.    Medications: lasix , compazine , zofran , potassium chloride , zyprexa , calcium  and vit d, prilosec   Labs: Vit D 81, Na 129, glucose 102, creatinine 1.26, calcium  8.4   Anthropometrics:  Ht: 61 inches Weight 182 lb 3.2 oz on 8/18 198 lb on 10/16/22 BMI: 34  8% weight loss in the last year, not significant  Reports that she has intentionally been trying to loose weight  Estimated Energy Needs  Kcals: 1800-2050 Protein: 82-98 g Fluid: > 1800 ml   NUTRITION DIAGNOSIS: Food and nutrition related knowledge deficit related to cancer treatment as evidenced by meeting with RD.    MALNUTRITION DIAGNOSIS: not at this time   INTERVENTION:  Discussed importance of good nutrition, weight stability during treatment Encouraged protein foods at meal times.   Encouraged bowel regimen to prevent constipation Discussed diet strategies to help with constipation.  Contact information provided   MONITORING, EVALUATION, GOAL: weight trends, intake   Next Visit: Tuesday, Sept 2nd during infusion  Jency Schnieders B. Dasie SOLON, CSO, LDN Registered Dietitian [phone]

## 2023-11-24 NOTE — Telephone Encounter (Signed)
 Called patient advised her to stop using [employer]  on the treatment site, also gave her information about her trazodone  that Dr. Melanee is not increasing the medication as it was just prescribed.

## 2023-11-25 ENCOUNTER — Ambulatory Visit

## 2023-11-25 ENCOUNTER — Other Ambulatory Visit: Payer: Self-pay

## 2023-11-25 ENCOUNTER — Ambulatory Visit
Admission: RE | Admit: 2023-11-25 | Discharge: 2023-11-25 | Disposition: A | Discharge status: Home / Self Care | Source: Ambulatory Visit | Attending: Radiation Oncology | Admitting: Radiation Oncology

## 2023-11-25 DIAGNOSIS — Z510 Encounter for antineoplastic radiation therapy: Secondary | ICD-10-CM | POA: Diagnosis not present

## 2023-11-25 LAB — RAD ONC ARIA SESSION SUMMARY
Course Elapsed Days: 9
Plan Fractions Treated to Date: 8
Plan Prescribed Dose Per Fraction: 1.8 Gy
Plan Total Fractions Prescribed: 36
Plan Total Prescribed Dose: 64.8 Gy
Reference Point Dosage Given to Date: 14.4 Gy
Reference Point Session Dosage Given: 1.8 Gy
Session Number: 8

## 2023-11-26 ENCOUNTER — Other Ambulatory Visit: Payer: Self-pay

## 2023-11-26 ENCOUNTER — Ambulatory Visit
Admission: RE | Admit: 2023-11-26 | Discharge: 2023-11-26 | Disposition: A | Discharge status: Home / Self Care | Source: Ambulatory Visit | Attending: Radiation Oncology | Admitting: Radiation Oncology

## 2023-11-26 ENCOUNTER — Ambulatory Visit

## 2023-11-26 ENCOUNTER — Telehealth: Payer: Self-pay | Admitting: Oncology

## 2023-11-26 DIAGNOSIS — Z510 Encounter for antineoplastic radiation therapy: Secondary | ICD-10-CM | POA: Diagnosis not present

## 2023-11-26 LAB — RAD ONC ARIA SESSION SUMMARY
Course Elapsed Days: 10
Plan Fractions Treated to Date: 9
Plan Prescribed Dose Per Fraction: 1.8 Gy
Plan Total Fractions Prescribed: 36
Plan Total Prescribed Dose: 64.8 Gy
Reference Point Dosage Given to Date: 16.2 Gy
Reference Point Session Dosage Given: 1.8 Gy
Session Number: 9

## 2023-11-26 MED FILL — Fosaprepitant Dimeglumine For IV Infusion 150 MG (Base Eq): INTRAVENOUS | Qty: 5 | Status: AC

## 2023-11-26 NOTE — Telephone Encounter (Signed)
 Patient called to report that she can barely urinate. Patient has bowel cancer and is currently on concurrent cisplatin  radiation. She reports that toda she has noticed decrease of urine production.  She has no difficulties emptying bladder.  She has drank 1 L of fluid and noticed that her urine is thick.  Volume is decreased.  Encourage patient to further increase orally.  If symptoms persist despite increase oral hydration, I recommend patient to go to urgent care for further evaluation.

## 2023-11-29 ENCOUNTER — Inpatient Hospital Stay (HOSPITAL_BASED_OUTPATIENT_CLINIC_OR_DEPARTMENT_OTHER): Admitting: Hospice and Palliative Medicine

## 2023-11-29 ENCOUNTER — Other Ambulatory Visit: Payer: Self-pay

## 2023-11-29 ENCOUNTER — Ambulatory Visit
Admission: RE | Admit: 2023-11-29 | Discharge: 2023-11-29 | Disposition: A | Discharge status: Home / Self Care | Source: Ambulatory Visit | Attending: Radiation Oncology | Admitting: Radiation Oncology

## 2023-11-29 ENCOUNTER — Inpatient Hospital Stay

## 2023-11-29 ENCOUNTER — Ambulatory Visit

## 2023-11-29 ENCOUNTER — Inpatient Hospital Stay: Admitting: Nurse Practitioner

## 2023-11-29 ENCOUNTER — Telehealth: Payer: Self-pay | Admitting: *Deleted

## 2023-11-29 VITALS — BP 123/64 | HR 74 | Temp 97.2°F | Resp 19 | Wt 184.2 lb

## 2023-11-29 DIAGNOSIS — C519 Malignant neoplasm of vulva, unspecified: Secondary | ICD-10-CM

## 2023-11-29 DIAGNOSIS — Z510 Encounter for antineoplastic radiation therapy: Secondary | ICD-10-CM | POA: Diagnosis not present

## 2023-11-29 LAB — RAD ONC ARIA SESSION SUMMARY
Course Elapsed Days: 13
Plan Fractions Treated to Date: 10
Plan Prescribed Dose Per Fraction: 1.8 Gy
Plan Total Fractions Prescribed: 36
Plan Total Prescribed Dose: 64.8 Gy
Reference Point Dosage Given to Date: 18 Gy
Reference Point Session Dosage Given: 1.8 Gy
Session Number: 10

## 2023-11-29 LAB — CBC WITH DIFFERENTIAL (CANCER CENTER ONLY)
Abs Immature Granulocytes: 0.03 K/uL (ref 0.00–0.07)
Basophils Absolute: 0 K/uL (ref 0.0–0.1)
Basophils Relative: 0 %
Eosinophils Absolute: 0.1 K/uL (ref 0.0–0.5)
Eosinophils Relative: 2 %
HCT: 31.7 % — ABNORMAL LOW (ref 36.0–46.0)
Hemoglobin: 11.3 g/dL — ABNORMAL LOW (ref 12.0–15.0)
Immature Granulocytes: 1 %
Lymphocytes Relative: 14 %
Lymphs Abs: 0.9 K/uL (ref 0.7–4.0)
MCH: 36.1 pg — ABNORMAL HIGH (ref 26.0–34.0)
MCHC: 35.6 g/dL (ref 30.0–36.0)
MCV: 101.3 fL — ABNORMAL HIGH (ref 80.0–100.0)
Monocytes Absolute: 0.6 K/uL (ref 0.1–1.0)
Monocytes Relative: 10 %
Neutro Abs: 4.7 K/uL (ref 1.7–7.7)
Neutrophils Relative %: 73 %
Platelet Count: 121 K/uL — ABNORMAL LOW (ref 150–400)
RBC: 3.13 MIL/uL — ABNORMAL LOW (ref 3.87–5.11)
RDW: 11.9 % (ref 11.5–15.5)
WBC Count: 6.4 K/uL (ref 4.0–10.5)
nRBC: 0 % (ref 0.0–0.2)

## 2023-11-29 LAB — URINALYSIS, COMPLETE (UACMP) WITH MICROSCOPIC
Bilirubin Urine: NEGATIVE
Glucose, UA: NEGATIVE mg/dL
Hgb urine dipstick: NEGATIVE
Ketones, ur: NEGATIVE mg/dL
Nitrite: NEGATIVE
Protein, ur: NEGATIVE mg/dL
Specific Gravity, Urine: 1.003 — ABNORMAL LOW (ref 1.005–1.030)
Squamous Epithelial / HPF: 0 /HPF (ref 0–5)
pH: 7 (ref 5.0–8.0)

## 2023-11-29 LAB — BASIC METABOLIC PANEL - CANCER CENTER ONLY
Anion gap: 8 (ref 5–15)
BUN: 11 mg/dL (ref 8–23)
CO2: 28 mmol/L (ref 22–32)
Calcium: 8.3 mg/dL — ABNORMAL LOW (ref 8.9–10.3)
Chloride: 95 mmol/L — ABNORMAL LOW (ref 98–111)
Creatinine: 1.12 mg/dL — ABNORMAL HIGH (ref 0.44–1.00)
GFR, Estimated: 55 mL/min — ABNORMAL LOW (ref >60)
Glucose, Bld: 104 mg/dL — ABNORMAL HIGH (ref 70–99)
Potassium: 3.7 mmol/L (ref 3.5–5.1)
Sodium: 131 mmol/L — ABNORMAL LOW (ref 135–145)

## 2023-11-29 LAB — MAGNESIUM: Magnesium: 1.8 mg/dL (ref 1.7–2.4)

## 2023-11-29 MED ORDER — SODIUM CHLORIDE 0.9 % IV SOLN
INTRAVENOUS | Status: DC
  Filled 2023-11-29: qty 250

## 2023-11-29 MED ORDER — PALONOSETRON HCL INJECTION 0.25 MG/5ML
0.2500 mg | Freq: Once | INTRAVENOUS | Status: AC
  Administered 2023-11-29: 0.25 mg via INTRAVENOUS
  Filled 2023-11-29: qty 5

## 2023-11-29 MED ORDER — POTASSIUM CHLORIDE IN NACL 20-0.9 MEQ/L-% IV SOLN
Freq: Once | INTRAVENOUS | Status: AC
  Filled 2023-11-29: qty 1000

## 2023-11-29 MED ORDER — SODIUM CHLORIDE 0.9 % IV SOLN
40.0000 mg/m2 | Freq: Once | INTRAVENOUS | Status: AC
  Administered 2023-11-29: 76 mg via INTRAVENOUS
  Filled 2023-11-29: qty 76

## 2023-11-29 MED ORDER — SODIUM CHLORIDE 0.9 % IV SOLN
150.0000 mg | Freq: Once | INTRAVENOUS | Status: AC
  Administered 2023-11-29: 150 mg via INTRAVENOUS
  Filled 2023-11-29: qty 150

## 2023-11-29 MED ORDER — DEXAMETHASONE SODIUM PHOSPHATE 10 MG/ML IJ SOLN
10.0000 mg | Freq: Once | INTRAMUSCULAR | Status: AC
  Administered 2023-11-29: 10 mg via INTRAVENOUS
  Filled 2023-11-29: qty 1

## 2023-11-29 MED ORDER — MAGNESIUM SULFATE 2 GM/50ML IV SOLN
2.0000 g | Freq: Once | INTRAVENOUS | Status: AC
  Administered 2023-11-29: 2 g via INTRAVENOUS
  Filled 2023-11-29: qty 50

## 2023-11-29 NOTE — Progress Notes (Signed)
 Symptom Management Clinic [facility] at [facility] Telephone:([phone]) [phone] [phone]  Patient Care Team: Entzminger, Ethridge LABOR, MD as PCP - General (Internal Medicine) [name] Shasta POUR, RN as Oncology Nurse Navigator Maurie Rayfield BIRCH, RN as Oncology Nurse Navigator [name], MD as Consulting Physician (Oncology) Lenn Aran, MD as Consulting Physician (Radiation Oncology)   NAME OF PATIENT: Patricia Good  [id number]  September 12, 1960   DATE OF VISIT: 11/29/23  REASON FOR CONSULT: [name] is a 63 y.o. 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  Patient is not surgical candidate.   INTERVAL HISTORY: Patient is on treatment with cisplatin  plus XRT.  Patient called on-call oncologist every week in complaining of difficulty voiding.  She was instructed to increase fluid intake and to present to urgent care for further evaluation if symptoms persisted.  She was an add-on to [facility] today to address these complaints.  However, today, patient says that she feels good.  She denies any difficulty with voiding.  No burning or urgency.  No fever or chills.  She does endorse constipation unrelieved with intermittent use of Dulcolax.  Denies any neurologic complaints. Denies recent fevers or illnesses. Denies any easy bleeding or bruising. Reports good appetite and denies weight loss. Denies chest pain. Denies any nausea, vomiting, or diarrhea. Denies urinary complaints. Patient offers no further specific complaints today.   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   Personal history of radiation therapy    Pre-diabetes    PTSD (post-traumatic stress disorder)    Schizoaffective disorder (HCC)     PAST SURGICAL HISTORY:  Past Surgical History:  Procedure Laterality Date   BREAST BIOPSY Left 03/03/2022   Left breast stereo bx Ribbon Clip- path pending   BREAST BIOPSY Left 03/03/2022   MM LT BREAST BX W LOC DEV 1ST LESION IMAGE BX SPEC STEREO GUIDE 03/03/2022 [facility]   BREAST LUMPECTOMY Left 03/20/2022   ECTOPIC PREGNANCY SURGERY     IR IMAGING GUIDED PORT INSERTION  11/11/2023   PART MASTECTOMY,RADIO FREQUENCY LOCALIZER,AXILLARY SENTINEL NODE BIOPSY Left 03/20/2022   Procedure: PART MASTECTOMY,RADIO FREQUENCY LOCALIZER,AXILLARY SENTINEL NODE BIOPSY;  Surgeon: Rodolph Romano, MD;  Location: [facility] ORS;  Service: General;  Laterality: Left;   TOTAL ABDOMINAL HYSTERECTOMY W/ BILATERAL SALPINGOOPHORECTOMY  1990    HEMATOLOGY/ONCOLOGY HISTORY:  Oncology History  Malignant neoplasm of upper-inner quadrant of left breast in female, estrogen receptor positive (HCC)  03/10/2022 Initial Diagnosis   Malignant neoplasm of upper-inner quadrant of left breast in female, estrogen receptor positive (HCC)   03/10/2022 Cancer Staging   Staging form: Breast, AJCC 8th Edition - Clinical stage from 03/10/2022: Stage IA (cT1a, cN0, cM0, G2, ER+, PR+, HER2-) - Signed by [name], MD on 03/10/2022 Histologic grading system: 3 grade system   04/07/2022 Cancer Staging   Staging form: Breast, AJCC 8th Edition - Pathologic stage from 04/07/2022: Stage IA (pT1a, pN0, cM0, G3, ER+, PR+, HER2-, Oncotype DX score:  28) - Signed by [name], MD on 04/13/2022 Stage prefix: Initial diagnosis Multigene prognostic tests performed: Oncotype DX Recurrence score range: Greater than or equal to 11 Histologic grading system: 3 grade system   Vulvar cancer (HCC)  08/09/2023 Initial  Diagnosis   Vulvar cancer (HCC)   10/11/2023 Cancer Staging   Staging form: Vulva, AJCC V9 - Clinical stage from 10/11/2023: FIGO Stage IIIB (cT2, cN1b, cM0) - Signed by [name], MD on 10/11/2023 Stage prefix: Initial diagnosis   11/15/2023 -  Chemotherapy   Patient is on Treatment Plan : Vulvar carcinoma Cisplatin  (40) q7d + XRT       ALLERGIES:  is allergic to shellfish allergy, sulfasalazine, and lisinopril.  MEDICATIONS:  Current Outpatient Medications  Medication Sig Dispense Refill   albuterol  (VENTOLIN  HFA) 108 (90 Base) MCG/ACT inhaler 2 puffs Inhalation every 4-6 hrs for 30 days for shortness of breath or wheezing     alendronate (FOSAMAX) 70 MG tablet Take 70 mg by mouth once a week.     amLODipine  (NORVASC ) 10 MG tablet 1 tablet Orally Once a day for 100 days for high blood pressure     atorvastatin (LIPITOR) 20 MG tablet Take 20 mg by mouth at bedtime.     buPROPion  (WELLBUTRIN  XL) 150 MG 24 hr tablet TAKE 1 TABLET BY MOUTH DAILY ALONG WITH 300 MG TABLET FOR TOTAL OF 450 MG DAILY 90 tablet 1   buPROPion  (WELLBUTRIN  XL) 300 MG 24 hr tablet Take 1 tablet (300 mg total) by mouth daily. Take along with 150 mg daily , total of 450 mg daily 90 tablet 1   Calcium  Carb-Cholecalciferol 500-10 MG-MCG TABS Take 1 tablet by mouth.     calcium -vitamin D  (OSCAL WITH D) 500-200 MG-UNIT tablet Take 1 tablet by mouth daily with breakfast.     clonazePAM  (KLONOPIN ) 0.5 MG tablet Take 1 tablet (0.5 mg total) by mouth daily as needed for anxiety. Please use it only for severe panic attacks, do not combine with Oxycodone  15 tablet 0   dexamethasone  (DECADRON ) 4 MG tablet Take 2 tablets daily x 3 days starting the day after chemotherapy. Take with food. 30 tablet 1   fluticasone  (FLONASE ) 50 MCG/ACT nasal spray Place into both nostrils as needed for allergies or rhinitis. (Patient not taking: Reported on 11/22/2023)     furosemide  (LASIX ) 20 MG tablet 1 tablet Orally Once a day for 100 days high  blood pressure     hydrOXYzine  (ATARAX ) 10 MG tablet Take 2 tablets (20 mg total) by mouth at bedtime as needed. for sleep and anxiety 180 tablet 0   letrozole  (FEMARA ) 2.5 MG tablet Take 1 tablet (2.5 mg total) by mouth daily. 30 tablet 3   levothyroxine  (SYNTHROID , LEVOTHROID) 112 MCG tablet      lidocaine -prilocaine  (EMLA ) cream Apply 1 Application topically as needed. Apply a small amount to port site 1 hour before chemotherapy 30 g 2   OLANZapine  (ZYPREXA ) 5 MG tablet Take 1 tablet (5 mg total) by mouth at bedtime. 90 tablet 3   omeprazole (PRILOSEC) 40 MG capsule Take 40 mg by mouth daily.      ondansetron  (ZOFRAN ) 8 MG tablet Take 1 tablet (8 mg total) by mouth every 8hrs as needed for nausea or vomiting. Start on the third day after cisplatin . 30 tablet 1   oxyCODONE  (OXY IR/ROXICODONE ) 5 MG immediate release tablet Take 0.5-1 tablets (2.5-5 mg total) by mouth every 8 (eight) hours as needed for severe pain (  pain score 7-10) (for pain unrelieved by tylenol ). 30 tablet 0   potassium chloride  SA (KLOR-CON  M) 20 MEQ tablet Take 1 tablet (20 mEq total) by mouth daily. For low potassium 30 tablet 0   prazosin  (MINIPRESS ) 2 MG capsule TAKE ONE CAPSULE BY MOUTH AT BEDTIME. 90 capsule 1   prochlorperazine  (COMPAZINE ) 10 MG tablet Take 1 tablet (10 mg total) by mouth every 6 (six) hours as needed for nausea or vomiting. 30 tablet 1   tiotropium (SPIRIVA ) 18 MCG inhalation capsule Place 18 mcg into inhaler and inhale daily.     traZODone  (DESYREL ) 50 MG tablet Take 1 tablet (50 mg total) by mouth at bedtime. 30 tablet 0   No current facility-administered medications for this visit.   Facility-Administered Medications Ordered in Other Visits  Medication Dose Route Frequency Provider Last Rate Last Admin   0.9 %  sodium chloride  infusion   Intravenous Continuous [name], MD       0.9 % NaCl with KCl 20 mEq/ L  infusion   Intravenous Once Rao, Archana C, MD       CISplatin  (PLATINOL ) 76 mg in  sodium chloride  0.9 % 250 mL chemo infusion  40 mg/m2 (Treatment Plan Recorded) Intravenous Once Rao, Archana C, MD       dexamethasone  (DECADRON ) injection 10 mg  10 mg Intravenous Once Rao, Archana C, MD       fosaprepitant  (EMEND) 150 mg in sodium chloride  0.9 % 145 mL IVPB  150 mg Intravenous Once Rao, Archana C, MD       magnesium  sulfate IVPB 2 g 50 mL  2 g Intravenous Once Rao, Archana C, MD       palonosetron  (ALOXI ) injection 0.25 mg  0.25 mg Intravenous Once Rao, Archana C, MD        VITAL SIGNS: There were no vitals taken for this visit. There were no vitals filed for this visit.   Estimated body mass index is 34.8 kg/m as calculated from the following:   Height as of 11/22/23: 5' 1 (1.549 m).   Weight as of an earlier encounter on 11/29/23: 184 lb 3.1 oz (83.6 kg).  LABS: CBC:    Component Value Date/Time   WBC 6.4 11/29/2023 0837   WBC 8.4 09/15/2023 1203   HGB 11.3 (L) 11/29/2023 0837   HGB 12.8 07/12/2017 1038   HCT 31.7 (L) 11/29/2023 0837   HCT 36.5 07/12/2017 1038   PLT 121 (L) 11/29/2023 0837   PLT 258 07/12/2017 1038   MCV 101.3 (H) 11/29/2023 0837   MCV 101 (H) 07/12/2017 1038   MCV 109 (H) 11/29/2013 0455   NEUTROABS 4.7 11/29/2023 0837   NEUTROABS 2.8 07/12/2017 1038   NEUTROABS 16.4 (H) 11/29/2013 0455   LYMPHSABS 0.9 11/29/2023 0837   LYMPHSABS 4.0 (H) 07/12/2017 1038   LYMPHSABS 1.4 11/29/2013 0455   MONOABS 0.6 11/29/2023 0837   MONOABS 0.3 11/29/2013 0455   EOSABS 0.1 11/29/2023 0837   EOSABS 0.9 (H) 07/12/2017 1038   EOSABS 0.0 11/29/2013 0455   BASOSABS 0.0 11/29/2023 0837   BASOSABS 0.0 07/12/2017 1038   BASOSABS 0.0 11/29/2013 0455   Comprehensive Metabolic Panel:    Component Value Date/Time   NA 131 (L) 11/29/2023 0838   NA 139 07/12/2017 1038   NA 138 11/29/2013 0455   K 3.7 11/29/2023 0838   K 3.9 11/29/2013 0455   CL 95 (L) 11/29/2023 0838   CL 105 11/29/2013 0455   CO2 28 11/29/2023  0838   CO2 23 11/29/2013 0455   BUN 11  11/29/2023 0838   BUN 13 07/12/2017 1038   BUN 18 11/29/2013 0455   CREATININE 1.12 (H) 11/29/2023 0838   CREATININE 1.46 (H) 11/29/2013 0455   GLUCOSE 104 (H) 11/29/2023 0838   GLUCOSE 165 (H) 11/29/2013 0455   CALCIUM  8.3 (L) 11/29/2023 0838   CALCIUM  8.6 11/29/2013 0455   AST 32 11/22/2023 0834   AST 43 (H) 11/19/2023 1210   ALT 113 (H) 11/22/2023 0834   ALT 268 (H) 11/19/2023 1210   ALT 32 11/28/2013 0152   ALKPHOS 179 (H) 11/22/2023 0834   ALKPHOS 131 (H) 11/28/2013 0152   BILITOT 0.6 11/22/2023 0834   BILITOT 0.7 11/19/2023 1210   PROT 6.7 11/22/2023 0834   PROT 7.4 07/12/2017 1038   PROT 7.2 11/28/2013 0152   ALBUMIN 3.6 11/22/2023 0834   ALBUMIN 4.3 07/12/2017 1038   ALBUMIN 3.2 (L) 11/28/2013 0152    RADIOGRAPHIC STUDIES: US  Abdomen Limited RUQ (LIVER/GB) Result Date: 11/23/2023 CLINICAL DATA:  Transaminitis. EXAM: ULTRASOUND ABDOMEN LIMITED RIGHT UPPER QUADRANT COMPARISON:  Renal ultrasound 09/23/2021. Abdominopelvic CT 11/03/2011. PET-CT 09/10/2023. FINDINGS: Gallbladder: No gallstones or wall thickening visualized. No sonographic Murphy sign noted by sonographer. Common bile duct: Diameter: 3 mm.  No intrahepatic biliary dilatation. Liver: No focal lesion identified. Within normal limits in parenchymal echogenicity. Portal vein is patent on color Doppler imaging with normal direction of blood flow towards the liver. Other: No right upper quadrant abdominal ascites demonstrated. IMPRESSION: Unremarkable right upper quadrant abdominal ultrasound. Electronically Signed   By: Elsie Perone M.D.   On: 11/23/2023 11:14   IR IMAGING GUIDED PORT INSERTION Result Date: 11/11/2023 INDICATION: History of GYN malignancy.  Poor IV access.  Chemotherapy. EXAM: IMPLANTED PORT A CATH PLACEMENT WITH ULTRASOUND AND FLUOROSCOPIC GUIDANCE MEDICATIONS: None ANESTHESIA/SEDATION: Moderate (conscious) sedation was employed during this procedure. A total of Versed  2 mg and Fentanyl  100 mcg was  administered intravenously. Moderate Sedation Time: 25 minutes. The patient's level of consciousness and vital signs were monitored continuously by radiology nursing throughout the procedure under my direct supervision. FLUOROSCOPY: Radiation Exposure Index and estimated peak skin dose (PSD); Reference air kerma (RAK), 1 mGy. COMPLICATIONS: None immediate. PROCEDURE: The procedure, risks, benefits, and alternatives were explained to the patient. Questions regarding the procedure were encouraged and answered. The patient understands and consents to the procedure. The RIGHT neck and chest were prepped with chlorhexidine  in a sterile fashion, and a sterile drape was applied covering the operative field. Maximum barrier sterile technique with sterile gowns and gloves were used for the procedure. A timeout was performed prior to the initiation of the procedure. Local anesthesia was provided with 1% lidocaine  with epinephrine . After creating a small venotomy incision, a micropuncture kit was utilized to access the internal jugular vein under direct, real-time ultrasound guidance. Ultrasound image documentation was performed. The microwire was kinked to measure appropriate catheter length. A subcutaneous port pocket was then created along the upper chest wall utilizing a combination of sharp and blunt dissection. The pocket was irrigated with sterile saline. A single lumen power injectable port was chosen for placement. The 8 Fr catheter was tunneled from the port pocket site to the venotomy incision. The port was placed in the pocket. The external catheter was trimmed to appropriate length. At the venotomy, an 8 Fr peel-away sheath was placed over a guidewire under fluoroscopic guidance. The catheter was then placed through the sheath and the sheath was removed. Final  catheter positioning was confirmed and documented with a fluoroscopic spot radiograph. The port was accessed with a Huber needle, aspirated and flushed with  heparinized saline. The port pocket incision was closed with interrupted 3-0 Vicryl suture then Dermabond was applied, including at the venotomy incision. Dressings were placed. The patient tolerated the procedure well without immediate post procedural complication. IMPRESSION: Successful placement of a RIGHT internal jugular approach power injectable Port-A-Cath. The tip of the catheter is positioned within the proximal RIGHT atrium. The catheter is ready for immediate use. Thom Hall, MD [facility] Electronically Signed   By: Thom Hall M.D.   On: 11/11/2023 12:06    PERFORMANCE STATUS (ECOG) : 1 - Symptomatic but completely ambulatory  Review of Systems Unless otherwise noted, a complete review of systems is negative.  Physical Exam General: NAD Cardiovascular: regular rate and rhythm Pulmonary: clear ant fields Abdomen: soft, nontender, + bowel sounds GU: no suprapubic tenderness Extremities: no edema, no joint deformities Skin: no rashes Neurological: nonfocal  IMPRESSION/PLAN: SCC of the vulva -on chemotherapy with cisplatin   Difficulty voiding -this appears to have resolved with increased fluid intake.  Hyponatremia -stable  Constipation -recommended switching from Dulcolax to Senokot and adding daily MiraLAX.  We discussed options for adjustment of her bowel regimen if needed.  Hypothyroidism -TSH was elevated but normal T4.  Discussed with Dr. Melanee and will continue current dose of Synthroid  at 112 mcg daily.  Patient encouraged to take first in the morning.  Will recheck TSH/free T4 in 6 weeks  MD follow-up next week  Patient expressed understanding and was in agreement with this plan. She also understands that She can call clinic at any time with any questions, concerns, or complaints.   Thank you for allowing me to participate in the care of this very pleasant patient.   Time Total: 15 minutes  Visit  consisted of counseling and education dealing with the complex and emotionally intense issues of symptom management in the setting of serious illness.Greater than 50%  of this time was spent counseling and coordinating care related to the above assessment and plan.  Signed by: Fonda Mower, PhD, NP-C

## 2023-11-29 NOTE — Progress Notes (Signed)
 [facility] CSW Progress Note  Clinical Child psychotherapist contacted patient by phone to follow-up on emotional support.    Interventions: Provided brief mental health counseling with regard to adjusting to her illness.  Patient reports feeling good.  She stated she has not been sick from the chemotherapy and has not lost her hair.  She is currently on a waiting list for an apartment in [place].  She expressed no other needs.       Follow Up Plan:  CSW will follow-up with patient by phone     Macario CHRISTELLA Au, LCSW Clinical Social Worker [facility]    Patient is participating in a Managed Medicaid Plan:  Yes

## 2023-11-29 NOTE — Patient Instructions (Signed)

## 2023-11-29 NOTE — Telephone Encounter (Signed)
 The patient called back and says that she needs refill for NP. The only thing I see from rx for synthroid  is 2020 and it was not a MD at cancer center. I feel that she needs to ask PCP.-Entzminger, Ethridge LABOR, MD. I called to PCP to reach out about the synthroid  112 mcg is what I see in the meds.  I called over to her PCP and left a message with one of the person there and she will give the information to her PCP to see what they can do.  Plan I also called her pharmacy and she says she has refills on the Synthroid .  It is coming to where she is probably going to need needed in the next 3 days.  The pharmacist that I spoke to said that if she calls or comes in she will tell her also but I did tell her that I sent it to the PCP MD .

## 2023-11-30 ENCOUNTER — Ambulatory Visit

## 2023-11-30 ENCOUNTER — Telehealth: Payer: Self-pay | Admitting: *Deleted

## 2023-11-30 ENCOUNTER — Other Ambulatory Visit: Payer: Self-pay

## 2023-11-30 ENCOUNTER — Inpatient Hospital Stay

## 2023-11-30 ENCOUNTER — Ambulatory Visit
Admission: RE | Admit: 2023-11-30 | Discharge: 2023-11-30 | Disposition: A | Discharge status: Home / Self Care | Source: Ambulatory Visit | Attending: Radiation Oncology | Admitting: Radiation Oncology

## 2023-11-30 DIAGNOSIS — Z510 Encounter for antineoplastic radiation therapy: Secondary | ICD-10-CM | POA: Diagnosis not present

## 2023-11-30 LAB — URINE CULTURE: Culture: NO GROWTH

## 2023-11-30 LAB — RAD ONC ARIA SESSION SUMMARY
Course Elapsed Days: 14
Plan Fractions Treated to Date: 11
Plan Prescribed Dose Per Fraction: 1.8 Gy
Plan Total Fractions Prescribed: 36
Plan Total Prescribed Dose: 64.8 Gy
Reference Point Dosage Given to Date: 19.8 Gy
Reference Point Session Dosage Given: 1.8 Gy
Session Number: 11

## 2023-11-30 NOTE — Progress Notes (Signed)
 [facility] CSW Progress Note  Visual merchandiser met with patient to follow-up on need for community resources.    Interventions: Referred patient to community resources: for smoking cessation.  Patient discussed what has not worked in the past for her.  She is seeking a support group.  CSW provided a list of local and national resources.  She said she continues feeling well, even after her treatments.  Patient expressed no other needs.       Follow Up Plan:  CSW will follow-up with patient by phone     Macario Patricia Au, LCSW Clinical Social Worker [facility]    Patient is participating in a Managed Medicaid Plan:  Yes

## 2023-11-30 NOTE — Telephone Encounter (Signed)
 Patient was thinking that she is going to be hard to get to each of those processes for 9 /2 when she comes in for her radiation labs see the doctor get infusion and I have nutrition person.  I told her that they can do it 1 after another after another so you still be able to get with everybody sometimes it might be 15 or 20 minutes off of it but she will get everything that she needed that day there is no conflict with.  She is okay with that

## 2023-12-01 ENCOUNTER — Other Ambulatory Visit: Payer: Self-pay

## 2023-12-01 ENCOUNTER — Encounter: Payer: Self-pay | Admitting: Oncology

## 2023-12-01 ENCOUNTER — Ambulatory Visit

## 2023-12-01 ENCOUNTER — Ambulatory Visit
Admission: RE | Admit: 2023-12-01 | Discharge: 2023-12-01 | Disposition: A | Discharge status: Home / Self Care | Source: Ambulatory Visit | Attending: Radiation Oncology | Admitting: Radiation Oncology

## 2023-12-01 DIAGNOSIS — Z510 Encounter for antineoplastic radiation therapy: Secondary | ICD-10-CM | POA: Diagnosis not present

## 2023-12-01 LAB — RAD ONC ARIA SESSION SUMMARY
Course Elapsed Days: 15
Plan Fractions Treated to Date: 12
Plan Prescribed Dose Per Fraction: 1.8 Gy
Plan Total Fractions Prescribed: 36
Plan Total Prescribed Dose: 64.8 Gy
Reference Point Dosage Given to Date: 21.6 Gy
Reference Point Session Dosage Given: 1.8 Gy
Session Number: 12

## 2023-12-02 ENCOUNTER — Other Ambulatory Visit: Payer: Self-pay | Admitting: Oncology

## 2023-12-02 ENCOUNTER — Telehealth: Payer: Self-pay | Admitting: *Deleted

## 2023-12-02 ENCOUNTER — Ambulatory Visit

## 2023-12-02 ENCOUNTER — Inpatient Hospital Stay

## 2023-12-02 ENCOUNTER — Other Ambulatory Visit: Payer: Self-pay

## 2023-12-02 ENCOUNTER — Ambulatory Visit
Admission: RE | Admit: 2023-12-02 | Discharge: 2023-12-02 | Disposition: A | Discharge status: Home / Self Care | Source: Ambulatory Visit | Attending: Radiation Oncology | Admitting: Radiation Oncology

## 2023-12-02 DIAGNOSIS — Z510 Encounter for antineoplastic radiation therapy: Secondary | ICD-10-CM | POA: Diagnosis not present

## 2023-12-02 DIAGNOSIS — Z853 Personal history of malignant neoplasm of breast: Secondary | ICD-10-CM

## 2023-12-02 LAB — RAD ONC ARIA SESSION SUMMARY
Course Elapsed Days: 16
Plan Fractions Treated to Date: 13
Plan Prescribed Dose Per Fraction: 1.8 Gy
Plan Total Fractions Prescribed: 36
Plan Total Prescribed Dose: 64.8 Gy
Reference Point Dosage Given to Date: 23.4 Gy
Reference Point Session Dosage Given: 1.8 Gy
Session Number: 13

## 2023-12-02 NOTE — Telephone Encounter (Signed)
 The patient called back and says that she has been having constipation she is to get some Senokot and Dulcolax.  She says that neither one of them worked.  I told her that if those do not work she should go ahead and get the labs and then take 1 scoop in the a.m. then later in the afternoon if the 1 in the AM did not work to do in the afternoon and then let us  know if it still is not going good.

## 2023-12-02 NOTE — Progress Notes (Signed)
 [facility] CSW Progress Note  Clinical Child psychotherapist contacted patient by phone to follow-up on need for community resources.    Interventions: CSW received a message from Grayce Riding regarding patient contacting the cancer center regarding a tax ID number.  Patient stated she gave Samule Bertrand a letter from her landlord for the [employer].  Verified Samule indeed had the letter.  She emailed a copy to [employer].  Patient expressed no other needs.      Follow Up Plan:  CSW will follow-up with patient by phone     Macario CHRISTELLA Au, LCSW Clinical Social Worker [facility]    Patient is participating in a Managed Medicaid Plan:  Yes

## 2023-12-03 ENCOUNTER — Other Ambulatory Visit: Payer: Self-pay

## 2023-12-03 ENCOUNTER — Telehealth (INDEPENDENT_AMBULATORY_CARE_PROVIDER_SITE_OTHER): Admitting: Psychiatry

## 2023-12-03 ENCOUNTER — Ambulatory Visit

## 2023-12-03 ENCOUNTER — Encounter: Payer: Self-pay | Admitting: Psychiatry

## 2023-12-03 ENCOUNTER — Ambulatory Visit
Admission: RE | Admit: 2023-12-03 | Discharge: 2023-12-03 | Disposition: A | Discharge status: Home / Self Care | Source: Ambulatory Visit | Attending: Radiation Oncology | Admitting: Radiation Oncology

## 2023-12-03 DIAGNOSIS — F250 Schizoaffective disorder, bipolar type: Secondary | ICD-10-CM

## 2023-12-03 DIAGNOSIS — F4310 Post-traumatic stress disorder, unspecified: Secondary | ICD-10-CM | POA: Diagnosis not present

## 2023-12-03 DIAGNOSIS — Z634 Disappearance and death of family member: Secondary | ICD-10-CM

## 2023-12-03 DIAGNOSIS — F17200 Nicotine dependence, unspecified, uncomplicated: Secondary | ICD-10-CM

## 2023-12-03 DIAGNOSIS — Z510 Encounter for antineoplastic radiation therapy: Secondary | ICD-10-CM | POA: Diagnosis not present

## 2023-12-03 DIAGNOSIS — F4323 Adjustment disorder with mixed anxiety and depressed mood: Secondary | ICD-10-CM

## 2023-12-03 DIAGNOSIS — F4010 Social phobia, unspecified: Secondary | ICD-10-CM

## 2023-12-03 LAB — RAD ONC ARIA SESSION SUMMARY
Course Elapsed Days: 17
Plan Fractions Treated to Date: 14
Plan Prescribed Dose Per Fraction: 1.8 Gy
Plan Total Fractions Prescribed: 36
Plan Total Prescribed Dose: 64.8 Gy
Reference Point Dosage Given to Date: 25.2 Gy
Reference Point Session Dosage Given: 1.8 Gy
Session Number: 14

## 2023-12-03 MED ORDER — VARENICLINE TARTRATE 0.5 MG PO TABS: 0.5000 mg | ORAL_TABLET | Freq: Two times a day (BID) | ORAL | 1 refills | Status: DC

## 2023-12-03 MED FILL — Fosaprepitant Dimeglumine For IV Infusion 150 MG (Base Eq): INTRAVENOUS | Qty: 5 | Status: AC

## 2023-12-03 NOTE — Progress Notes (Signed)
 Virtual Visit via Video Note  I connected with Patricia Good on 12/03/23 at  9:00 AM EDT by a video enabled telemedicine application and verified that I am speaking with the correct person using two identifiers.  Location Provider Location : [facility] Patient Location : Home  Participants: Patient , Provider   I discussed the limitations of evaluation and management by telemedicine and the availability of in person appointments. The patient expressed understanding and agreed to proceed.   I discussed the assessment and treatment plan with the patient. The patient was provided an opportunity to ask questions and all were answered. The patient agreed with the plan and demonstrated an understanding of the instructions.   The patient was advised to call back or seek an in-person evaluation if the symptoms worsen or if the condition fails to improve as anticipated.  [facility] MD OP Progress Note  12/03/2023 9:30 AM Patricia Good  MRN:  [id number]  Chief Complaint:  Chief Complaint  Patient presents with   Follow-up   Depression   Anxiety   schizoaffective disorder   smoking cessation   Discussed the use of AI scribe software for clinical note transcription with the patient, who gave verbal consent to proceed.  History of Present Illness Patricia Good is a 63 year old Caucasian female lives in [place], single, has a history of schizoaffective disorder, PTSD, social anxiety disorder, COPD, vitamin B12 deficiency, psoriasis, history of malignant melanoma, hypothyroidism, osteoarthritis, stage I carcinoma of the left breast ER/PR-positive,HER 2 negative status post left sided partial mastectomy currently on letrozole , vulvar cancer was evaluated by telemedicine today.  Overall, she reports doing well and describes good tolerance of her ongoing cancer treatment, identifying fatigue as her only notable side effect. She states that her pain has subsided and that she uses oxycodone  as needed, expressing  concern about its potential for dependence.  She describes her mood as stable and reports sleeping well most nights, with occasional difficulty that she attributes to anxiety about upcoming appointments. She reports experiencing anxiety related to her cancer treatment and medical appointments, and she has used clonazepam  as needed for severe anxiety attacks. She notes that she has not needed it often. She denies any side effects or concerns from her medications.  Her current medications include olanzapine  5 mg, wellbutrin  450 mg, prazosin  2 mg, and hydroxyzine  10-20 mg as needed.To help manage anxiety, she uses mindfulness strategies, such as increasing awareness of her surroundings, but she has not used breathing exercises. She identifies her sister as a significant support, spending time together weekly, especially during chemotherapy sessions.  She denies any memory changes and appeared to be alert, oriented to person place time situation.  She reports current tobacco use and expresses a desire to stop smoking.  Receptive to counseling.  She lives at home and drives herself to appointments. She owns a 2019 [employer] truck and is proud of it. She maintains a close relationship with her older sister, who spends Mondays with her and provides support, including visiting and going out to lunch together.    Visit Diagnosis:    ICD-10-CM   1. Schizoaffective disorder, bipolar type (HCC)  F25.0     2. PTSD (post-traumatic stress disorder)  F43.10     3. Social anxiety disorder  F40.10     4. Bereavement  Z63.4     5. Tobacco use disorder  F17.200 varenicline  (CHANTIX ) 0.5 MG tablet    6. Adjustment disorder with mixed anxiety and depressed mood  Q56.76       Past Psychiatric History: I have reviewed past psychiatric history from progress note on 07/16/2017.  Past Medical History:  Past Medical History:  Diagnosis Date   Abnormal thyroid  stimulating hormone (TSH) level    Anemia     Anxiety    Asthma    Bipolar 1 disorder (HCC)    COPD (chronic obstructive pulmonary disease) (HCC)    Endometriosis    GERD (gastroesophageal reflux disease)    Hepatitis A    Hypertension    Hypothyroidism    Malignant neoplasm of upper-inner quadrant of left breast in female, 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   Personal history of radiation therapy    Pre-diabetes    PTSD (post-traumatic stress disorder)    Schizoaffective disorder (HCC)     Past Surgical History:  Procedure Laterality Date   BREAST BIOPSY Left 03/03/2022   Left breast stereo bx Ribbon Clip- path pending   BREAST BIOPSY Left 03/03/2022   MM LT BREAST BX W LOC DEV 1ST LESION IMAGE BX SPEC STEREO GUIDE 03/03/2022 [facility]   BREAST LUMPECTOMY Left 03/20/2022   ECTOPIC PREGNANCY SURGERY     IR IMAGING GUIDED PORT INSERTION  11/11/2023   PART MASTECTOMY,RADIO FREQUENCY LOCALIZER,AXILLARY SENTINEL NODE BIOPSY Left 03/20/2022   Procedure: PART MASTECTOMY,RADIO FREQUENCY LOCALIZER,AXILLARY SENTINEL NODE BIOPSY;  Surgeon: Rodolph Romano, MD;  Location: [facility];  Service: General;  Laterality: Left;   TOTAL ABDOMINAL HYSTERECTOMY W/ BILATERAL SALPINGOOPHORECTOMY  1990    Family Psychiatric History: I have reviewed family psychiatric history from progress note on 07/16/2017.  Family History:  Family History  Problem Relation Age of Onset   Hypertension Father    Diabetes Father    Heart disease Father    Blindness Father    Alcohol abuse Sister    Drug abuse Sister    Anxiety disorder Sister    Depression Sister    Obesity Brother    Arthritis Brother    Alcohol abuse Brother    Drug abuse Brother    Depression Brother    Breast cancer Cousin    Clotting disorder Neg Hx     Social History: I have reviewed social history from progress note on 07/16/2017. Social History   Socioeconomic History   Marital status: Single    Spouse name: Not on file   Number of children: 0   Years of  education: Not on file   Highest education level: Associate degree: occupational, Scientist, product/process development, or vocational program  Occupational History   Not on file  Tobacco Use   Smoking status: Every Day    Current packs/day: 0.50    Average packs/day: 0.5 packs/day for 54.9 years (27.4 ttl pk-yrs)    Types: Cigarettes    Start date: 01/22/1969   Smokeless tobacco: Never   Tobacco comments:    Continues to go down on number per day to 5-7 per day.   Vaping Use   Vaping status: Never Used  Substance and Sexual Activity   Alcohol use: Not Currently    Alcohol/week: 1.0 - 2.0 standard drink of alcohol    Types: 1 Glasses of wine per week    Comment: occasional   Drug use: No   Sexual activity: Not Currently    Birth control/protection: None  Other Topics Concern   Not on file  Social History Narrative   Lives alone   Social Drivers of Health   Financial Resource Strain: Medium Risk (08/03/2023)   [facility]   Overall Financial Resource Strain (CARDIA)    Difficulty of Paying Living Expenses: Somewhat hard  Food Insecurity: Food Insecurity Present (11/15/2023)   Hunger Vital Sign    Worried About Running Out of Food in the Last Year: Sometimes true    Ran Out of Food in the Last Year: Sometimes true  Transportation Needs: No Transportation Needs (08/19/2023)   PRAPARE - Administrator, Civil Service (Medical): No    Lack of Transportation (Non-Medical): No  Physical Activity: Inactive (06/15/2017)   Exercise Vital Sign    Days of Exercise per Week: 0 days    Minutes of Exercise per Session: 0 min  Stress: Stress Concern Present (06/15/2017)   [employer] of Occupational Health - Occupational Stress Questionnaire    Feeling of Stress : Very much  Social Connections: Socially Isolated (06/15/2017)   Social Connection and Isolation Panel    Frequency of Communication with Friends and Family: Never    Frequency of Social Gatherings with  Friends and Family: Never    Attends Religious Services: Never    Database administrator or Organizations: No    Attends Banker Meetings: Never    Marital Status: Divorced    Allergies:  Allergies  Allergen Reactions   Shellfish Allergy Anaphylaxis    Only to crab meat (non-imitation), not shrimp   Sulfasalazine Anaphylaxis and Hives    Other reaction(s): Other (See Comments)   Lisinopril     unknown    Metabolic Disorder Labs: Lab Results  Component Value Date   HGBA1C 5.7 (H) 05/06/2020   MPG 116.89 05/06/2020   MPG 117 12/21/2017   Lab Results  Component Value Date   PROLACTIN 1.3 (L) 05/06/2020   PROLACTIN 1.2 (L) 07/12/2017   Lab Results  Component Value Date   CHOL 193 05/06/2020   TRIG 103 05/06/2020   HDL 56 05/06/2020   CHOLHDL 3.4 05/06/2020   VLDL 21 05/06/2020   LDLCALC 116 (H) 05/06/2020   LDLCALC 163 (H) 12/21/2017   Lab Results  Component Value Date   TSH 14.152 (H) 11/22/2023   TSH 2.250 06/17/2021    Therapeutic Level Labs: No results found for: LITHIUM No results found for: VALPROATE No results found for: CBMZ  Current Medications: Current Outpatient Medications  Medication Sig Dispense Refill   varenicline  (CHANTIX ) 0.5 MG tablet Take 1 tablet (0.5 mg total) by mouth 2 (two) times daily. 60 tablet 1   albuterol  (VENTOLIN  HFA) 108 (90 Base) MCG/ACT inhaler 2 puffs Inhalation every 4-6 hrs for 30 days for shortness of breath or wheezing     alendronate (FOSAMAX) 70 MG tablet Take 70 mg by mouth once a week.     amLODipine  (NORVASC ) 10 MG tablet 1 tablet Orally Once a day for 100 days for high blood pressure     atorvastatin (LIPITOR) 20 MG tablet Take 20 mg by mouth at bedtime.     buPROPion  (WELLBUTRIN  XL) 150 MG 24 hr tablet TAKE 1 TABLET BY MOUTH DAILY ALONG WITH 300 MG TABLET FOR TOTAL OF 450 MG DAILY 90 tablet 1   buPROPion  (WELLBUTRIN  XL) 300 MG 24 hr tablet Take 1 tablet (300 mg total) by mouth daily. Take along  with 150 mg daily , total of 450 mg daily 90 tablet 1   Calcium  Carb-Cholecalciferol 500-10 MG-MCG TABS Take 1 tablet by mouth.     calcium -vitamin D  (OSCAL WITH D) 500-200 MG-UNIT tablet Take 1 tablet by mouth  daily with breakfast.     clonazePAM  (KLONOPIN ) 0.5 MG tablet Take 1 tablet (0.5 mg total) by mouth daily as needed for anxiety. Please use it only for severe panic attacks, do not combine with Oxycodone  15 tablet 0   dexamethasone  (DECADRON ) 4 MG tablet Take 2 tablets daily x 3 days starting the day after chemotherapy. Take with food. 30 tablet 1   fluticasone  (FLONASE ) 50 MCG/ACT nasal spray Place into both nostrils as needed for allergies or rhinitis. (Patient not taking: Reported on 11/22/2023)     furosemide  (LASIX ) 20 MG tablet 1 tablet Orally Once a day for 100 days high blood pressure     hydrOXYzine  (ATARAX ) 10 MG tablet Take 2 tablets (20 mg total) by mouth at bedtime as needed. for sleep and anxiety 180 tablet 0   letrozole  (FEMARA ) 2.5 MG tablet Take 1 tablet (2.5 mg total) by mouth daily. 30 tablet 3   levothyroxine  (SYNTHROID , LEVOTHROID) 112 MCG tablet      lidocaine -prilocaine  (EMLA ) cream Apply 1 Application topically as needed. Apply a small amount to port site 1 hour before chemotherapy 30 g 2   OLANZapine  (ZYPREXA ) 5 MG tablet Take 1 tablet (5 mg total) by mouth at bedtime. 90 tablet 3   omeprazole (PRILOSEC) 40 MG capsule Take 40 mg by mouth daily.      ondansetron  (ZOFRAN ) 8 MG tablet Take 1 tablet (8 mg total) by mouth every 8hrs as needed for nausea or vomiting. Start on the third day after cisplatin . 30 tablet 1   oxyCODONE  (OXY IR/ROXICODONE ) 5 MG immediate release tablet Take 0.5-1 tablets (2.5-5 mg total) by mouth every 8 (eight) hours as needed for severe pain (pain score 7-10) (for pain unrelieved by tylenol ). 30 tablet 0   potassium chloride  SA (KLOR-CON  M) 20 MEQ tablet Take 1 tablet (20 mEq total) by mouth daily. For low potassium 30 tablet 0   prazosin   (MINIPRESS ) 2 MG capsule TAKE ONE CAPSULE BY MOUTH AT BEDTIME. 90 capsule 1   prochlorperazine  (COMPAZINE ) 10 MG tablet Take 1 tablet (10 mg total) by mouth every 6 (six) hours as needed for nausea or vomiting. 30 tablet 1   tiotropium (SPIRIVA ) 18 MCG inhalation capsule Place 18 mcg into inhaler and inhale daily.     traZODone  (DESYREL ) 50 MG tablet Take 1 tablet (50 mg total) by mouth at bedtime. 30 tablet 0   No current facility-administered medications for this visit.     Musculoskeletal: Strength & Muscle Tone: UTA Gait & Station: Seated Patient leans: N/A  Psychiatric Specialty Exam: Review of Systems  Psychiatric/Behavioral:  The patient is nervous/anxious.     There were no vitals taken for this visit.There is no height or weight on file to calculate BMI.  General Appearance: Casual  Eye Contact:  Fair  Speech:  Clear and Coherent  Volume:  Normal  Mood:  Anxious  Affect:  Appropriate  Thought Process:  Goal Directed and Descriptions of Associations: Intact  Orientation:  Full (Time, Place, and Person)  Thought Content: Logical   Suicidal Thoughts:  No  Homicidal Thoughts:  No  Memory:  Immediate;   Fair Recent;   Fair Remote;   Fair  Judgement:  Fair  Insight:  Fair  Psychomotor Activity:  Normal  Concentration:  Concentration: Fair and Attention Span: Fair  Recall:  [employer] of Knowledge: Fair  Language: Fair  Akathisia:  No  Handed:  Right  AIMS (if indicated): not done  Assets:  Communication  Skills Desire for Improvement Housing Social Support Talents/Skills Transportation  ADL's:  Intact  Cognition: WNL  Sleep:  Fair   Screenings: Midwife Visit from 09/28/2023 in [facility] Office Visit from 09/09/2023 in [facility] Visit from 06/01/2023 in [facility] Office Visit from 04/15/2023 in [facility] Office Visit from 01/13/2023 in [facility]  AIMS Total Score 0 0 0 0 0   AUDIT    Flowsheet Row Admission (Discharged) from 12/22/2017 in [facility]  Alcohol Use Disorder Identification Test Final Score (AUDIT) 1   GAD-7    Flowsheet Row Office Visit from 06/01/2023 in [facility] Office Visit from 04/15/2023 in [facility] Office Visit from 01/13/2023 in [facility] Office Visit from 08/26/2022 in [facility] Office Visit from 06/18/2022 in [facility]  Total GAD-7 Score 4 2 2 1 3    PHQ2-9    Flowsheet Row Infusion from 11/29/2023 in [facility] Of [place] Office Visit from 11/22/2023 in [facility] Dept Of [place]. [facility] Office Visit from 11/08/2023 in [facility] Dept Of [place]. [facility] Office Visit from 08/18/2023 in [facility] Of [place]. [facility] Office Visit from 06/01/2023 in [facility]  PHQ-2 Total Score 0 0 0 0 1  PHQ-9 Total Score -- -- -- -- 4   Flowsheet Row Video Visit from 12/03/2023 in [facility] Video Visit from 11/05/2023 in [facility] Office Visit from 10/28/2023 in [facility]  C-SSRS RISK CATEGORY Moderate Risk Moderate Risk Moderate Risk     Assessment and Plan: RANDYE TREICHLER is a 63 year old Caucasian female with schizoaffective disorder, anxiety, recent diagnosis of vulvar cancer currently undergoing treatment for the same was evaluated by telemedicine today.   Discussed assessment and plan as noted below.  Adjustment disorder with mixed anxiety and depressed mood-improving Currently undergoing chemotherapy, radiation for vulvar cancer.  Currently overall managing anxiety symptoms well. Currently engaged in ACT, supportive counseling strategies. Discussed mindfulness strategies. Continue Clonazepam  0.5 mg daily as needed for severe anxiety.  Schizoaffective disorder-stable Currently denies any hallucinations or mood symptoms. Continue Olanzapine  5 mg daily Continue Wellbutrin  450 mg daily  Posttraumatic stress disorder-stable Denies any significant flashbacks or intrusive memories Continue Prazosin  2 mg at bedtime  Social anxiety disorder-unstable She continues to have ongoing anxiety in social situations. Continue Hydroxyzine  10 to 20 mg at bedtime as needed Continue Clonazepam  as needed. Engaged in supportive counseling at this time.  Bereavement-improving Currently dealing with grief better than before. Will monitor closely.  Tobacco use disorder-unstable Interested in quitting. Provided counseling for 5 minutes. Start Chantix  0.5 mg twice daily. Provided medication education.  Follow-up Follow-up in clinic in 4 weeks or sooner if needed.  Consent: Patient/Guardian gives verbal consent for treatment and assignment of benefits for services provided during this visit. Patient/Guardian expressed understanding and agreed to proceed.   This note was generated in part or whole with voice recognition software. Voice recognition  is usually quite accurate but there are transcription errors that can and very often do occur. I apologize for any typographical errors that were not detected and corrected.    Chanoch Mccleery, MD 12/03/2023, 9:30 AM

## 2023-12-07 ENCOUNTER — Other Ambulatory Visit: Payer: Self-pay

## 2023-12-07 ENCOUNTER — Inpatient Hospital Stay

## 2023-12-07 ENCOUNTER — Inpatient Hospital Stay (HOSPITAL_BASED_OUTPATIENT_CLINIC_OR_DEPARTMENT_OTHER): Admitting: Oncology

## 2023-12-07 ENCOUNTER — Encounter: Payer: Self-pay | Admitting: Oncology

## 2023-12-07 ENCOUNTER — Other Ambulatory Visit: Payer: Self-pay | Admitting: Oncology

## 2023-12-07 ENCOUNTER — Ambulatory Visit
Admission: RE | Admit: 2023-12-07 | Discharge: 2023-12-07 | Disposition: A | Discharge status: Home / Self Care | Source: Ambulatory Visit | Attending: Radiation Oncology | Admitting: Radiation Oncology

## 2023-12-07 ENCOUNTER — Ambulatory Visit

## 2023-12-07 VITALS — BP 128/63 | HR 73 | Temp 96.6°F | Resp 19 | Ht 61.0 in | Wt 185.2 lb

## 2023-12-07 VITALS — BP 132/62 | HR 76

## 2023-12-07 DIAGNOSIS — C519 Malignant neoplasm of vulva, unspecified: Secondary | ICD-10-CM | POA: Insufficient documentation

## 2023-12-07 DIAGNOSIS — Z5189 Encounter for other specified aftercare: Secondary | ICD-10-CM | POA: Insufficient documentation

## 2023-12-07 DIAGNOSIS — F17210 Nicotine dependence, cigarettes, uncomplicated: Secondary | ICD-10-CM | POA: Diagnosis not present

## 2023-12-07 DIAGNOSIS — E876 Hypokalemia: Secondary | ICD-10-CM | POA: Insufficient documentation

## 2023-12-07 DIAGNOSIS — Z79811 Long term (current) use of aromatase inhibitors: Secondary | ICD-10-CM | POA: Diagnosis not present

## 2023-12-07 DIAGNOSIS — R39198 Other difficulties with micturition: Secondary | ICD-10-CM | POA: Insufficient documentation

## 2023-12-07 DIAGNOSIS — C518 Malignant neoplasm of overlapping sites of vulva: Secondary | ICD-10-CM | POA: Insufficient documentation

## 2023-12-07 DIAGNOSIS — G893 Neoplasm related pain (acute) (chronic): Secondary | ICD-10-CM | POA: Insufficient documentation

## 2023-12-07 DIAGNOSIS — Z7983 Long term (current) use of bisphosphonates: Secondary | ICD-10-CM | POA: Insufficient documentation

## 2023-12-07 DIAGNOSIS — D6959 Other secondary thrombocytopenia: Secondary | ICD-10-CM

## 2023-12-07 DIAGNOSIS — E871 Hypo-osmolality and hyponatremia: Secondary | ICD-10-CM | POA: Diagnosis not present

## 2023-12-07 DIAGNOSIS — Z5111 Encounter for antineoplastic chemotherapy: Secondary | ICD-10-CM | POA: Diagnosis not present

## 2023-12-07 DIAGNOSIS — T451X5A Adverse effect of antineoplastic and immunosuppressive drugs, initial encounter: Secondary | ICD-10-CM | POA: Diagnosis not present

## 2023-12-07 DIAGNOSIS — Z510 Encounter for antineoplastic radiation therapy: Secondary | ICD-10-CM | POA: Insufficient documentation

## 2023-12-07 DIAGNOSIS — K5900 Constipation, unspecified: Secondary | ICD-10-CM | POA: Insufficient documentation

## 2023-12-07 DIAGNOSIS — Z853 Personal history of malignant neoplasm of breast: Secondary | ICD-10-CM | POA: Insufficient documentation

## 2023-12-07 DIAGNOSIS — E039 Hypothyroidism, unspecified: Secondary | ICD-10-CM | POA: Diagnosis not present

## 2023-12-07 LAB — CBC WITH DIFFERENTIAL (CANCER CENTER ONLY)
Abs Immature Granulocytes: 0.07 K/uL (ref 0.00–0.07)
Basophils Absolute: 0 K/uL (ref 0.0–0.1)
Basophils Relative: 0 %
Eosinophils Absolute: 0 K/uL (ref 0.0–0.5)
Eosinophils Relative: 0 %
HCT: 31.7 % — ABNORMAL LOW (ref 36.0–46.0)
Hemoglobin: 11.1 g/dL — ABNORMAL LOW (ref 12.0–15.0)
Immature Granulocytes: 2 %
Lymphocytes Relative: 7 %
Lymphs Abs: 0.3 K/uL — ABNORMAL LOW (ref 0.7–4.0)
MCH: 35.4 pg — ABNORMAL HIGH (ref 26.0–34.0)
MCHC: 35 g/dL (ref 30.0–36.0)
MCV: 101 fL — ABNORMAL HIGH (ref 80.0–100.0)
Monocytes Absolute: 0.2 K/uL (ref 0.1–1.0)
Monocytes Relative: 4 %
Neutro Abs: 4.1 K/uL (ref 1.7–7.7)
Neutrophils Relative %: 87 %
Platelet Count: 86 K/uL — ABNORMAL LOW (ref 150–400)
RBC: 3.14 MIL/uL — ABNORMAL LOW (ref 3.87–5.11)
RDW: 12.3 % (ref 11.5–15.5)
WBC Count: 4.6 K/uL (ref 4.0–10.5)
nRBC: 0 % (ref 0.0–0.2)

## 2023-12-07 LAB — BASIC METABOLIC PANEL - CANCER CENTER ONLY
Anion gap: 8 (ref 5–15)
BUN: 16 mg/dL (ref 8–23)
CO2: 27 mmol/L (ref 22–32)
Calcium: 8.5 mg/dL — ABNORMAL LOW (ref 8.9–10.3)
Chloride: 91 mmol/L — ABNORMAL LOW (ref 98–111)
Creatinine: 1.05 mg/dL — ABNORMAL HIGH (ref 0.44–1.00)
GFR, Estimated: 59 mL/min — ABNORMAL LOW (ref >60)
Glucose, Bld: 151 mg/dL — ABNORMAL HIGH (ref 70–99)
Potassium: 4.1 mmol/L (ref 3.5–5.1)
Sodium: 126 mmol/L — ABNORMAL LOW (ref 135–145)

## 2023-12-07 LAB — HEPATIC FUNCTION PANEL
ALT: 48 U/L — ABNORMAL HIGH (ref 0–44)
AST: 33 U/L (ref 15–41)
Albumin: 3.8 g/dL (ref 3.5–5.0)
Alkaline Phosphatase: 125 U/L (ref 38–126)
Bilirubin, Direct: 0.1 mg/dL (ref 0.0–0.2)
Indirect Bilirubin: 0.4 mg/dL (ref 0.3–0.9)
Total Bilirubin: 0.5 mg/dL (ref 0.0–1.2)
Total Protein: 6.6 g/dL (ref 6.5–8.1)

## 2023-12-07 LAB — RAD ONC ARIA SESSION SUMMARY
Course Elapsed Days: 21
Plan Fractions Treated to Date: 15
Plan Prescribed Dose Per Fraction: 1.8 Gy
Plan Total Fractions Prescribed: 36
Plan Total Prescribed Dose: 64.8 Gy
Reference Point Dosage Given to Date: 27 Gy
Reference Point Session Dosage Given: 1.8 Gy
Session Number: 15

## 2023-12-07 LAB — MAGNESIUM: Magnesium: 2.1 mg/dL (ref 1.7–2.4)

## 2023-12-07 MED ORDER — SODIUM CHLORIDE 0.9 % IV SOLN
150.0000 mg | Freq: Once | INTRAVENOUS | Status: AC
  Administered 2023-12-07: 150 mg via INTRAVENOUS
  Filled 2023-12-07: qty 150

## 2023-12-07 MED ORDER — POTASSIUM CHLORIDE IN NACL 20-0.9 MEQ/L-% IV SOLN
Freq: Once | INTRAVENOUS | Status: AC
  Filled 2023-12-07: qty 1000

## 2023-12-07 MED ORDER — SODIUM CHLORIDE 0.9 % IV SOLN
INTRAVENOUS | Status: DC
  Filled 2023-12-07: qty 250

## 2023-12-07 MED ORDER — PALONOSETRON HCL INJECTION 0.25 MG/5ML
0.2500 mg | Freq: Once | INTRAVENOUS | Status: AC
  Administered 2023-12-07: 0.25 mg via INTRAVENOUS
  Filled 2023-12-07: qty 5

## 2023-12-07 MED ORDER — DEXAMETHASONE SODIUM PHOSPHATE 10 MG/ML IJ SOLN
10.0000 mg | Freq: Once | INTRAMUSCULAR | Status: AC
  Administered 2023-12-07: 10 mg via INTRAVENOUS
  Filled 2023-12-07: qty 1

## 2023-12-07 MED ORDER — SODIUM CHLORIDE 0.9 % IV SOLN
30.0000 mg/m2 | Freq: Once | INTRAVENOUS | Status: AC
  Administered 2023-12-07: 57 mg via INTRAVENOUS
  Filled 2023-12-07: qty 57

## 2023-12-07 MED ORDER — MAGNESIUM SULFATE 2 GM/50ML IV SOLN
2.0000 g | Freq: Once | INTRAVENOUS | Status: AC
  Administered 2023-12-07: 2 g via INTRAVENOUS
  Filled 2023-12-07: qty 50

## 2023-12-07 NOTE — Progress Notes (Signed)
 Nutrition Follow-up:  Patient with stage III SCC of vulva currently receiving concurrent chemoradiation.  Receiving cisplatin .  Met with patient during infusion.  Reports that her appetite is good.  No issues or concerns.  Reports constipation but PCP prescribed liquid medication (lactulose) and took a dose on Saturday and had good bowel movement on Sunday.      Medications: reviewed  Labs: reviewed  Anthropometrics:   Weight 185 lb   182 lb on 8/18 198 lb on 10/16/22   NUTRITION DIAGNOSIS: Food and nutrition related knowledge deficit improved   INTERVENTION:  Continue well balanced diet including lean protein and plant foods.  Continue bowel regimen    MONITORING, EVALUATION, GOAL: weight trends, intake   NEXT VISIT: Tuesday, Sept 16 during infusion  Tilden Broz B. Dasie SOLON, CSO, LDN Registered Dietitian [phone]

## 2023-12-07 NOTE — Progress Notes (Signed)
 Hematology/Oncology Consult note [facility]  Telephone:([phone] [phone]  Patient Care Team: Entzminger, Ethridge LABOR, MD as PCP - General (Internal Medicine) [name] Shasta POUR, RN as Oncology Nurse Navigator Maurie Rayfield BIRCH, RN as Oncology Nurse Navigator [name], MD as Consulting Physician (Oncology) Lenn Aran, MD as Consulting Physician (Radiation Oncology)   Name of the patient: Patricia Good  [id number]  21-Jul-1960   Date of visit: 12/07/23  Diagnosis-  1.  History of breast cancer in remission 2.  Locally advanced vulvar carcinoma  Chief complaint/ Reason for visit-on treatment assessment prior to cycle 4 of weekly cisplatin  chemotherapy  Heme/Onc history: Patient is a 63 year old female with history of stage I left breast cancer ER/PR positive HER2 negative.  She underwent lumpectomy and sentinel lymph node biopsy.  She declined adjuvant chemotherapy despite her recurrence score of 28.  She also stopped radiation therapy prematurely.She is presently on letrozole  for endocrine therapy.  She has baseline osteoporosis for which she is on Fosamax.   She was seen by infectious disease for herpetic like lesions but overall lesions were concerning for malignancy and patient was seen by Dr. Lovetta April 2025.  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    PET CT scan on 09/10/2023 showed: 1. Superficial right vulvar lesion measuring up to 3.3 cm in length with maximum SUV 13.6, compatible with malignancy. 2. Small amount of accentuated activity along the right upper medial thigh just below the crease with the pubis, with associated soft tissue density measuring about 0.7 by 1.7 cm, with maximum SUV  7.7, concerning for malignancy. 3. Right inguinal and right external iliac lymph nodes with maximum above blood pool, suspicious for metastatic disease.   Plan is for weekly cisplatin  for concurrent chemoradiation  Interval history-tolerating chemotherapy well so far.  Denies any significant tingling numbness in her extremities  ECOG PS- 1 Pain scale- 0   Review of systems- Review of Systems  Constitutional:  Negative for chills, fever, malaise/fatigue and weight loss.  HENT:  Negative for congestion, ear discharge and nosebleeds.   Eyes:  Negative for blurred vision.  Respiratory:  Negative for cough, hemoptysis, sputum production, shortness of breath and wheezing.   Cardiovascular:  Negative for chest pain, palpitations, orthopnea and claudication.  Gastrointestinal:  Negative for abdominal pain, blood in stool, constipation, diarrhea, heartburn, melena, nausea and vomiting.  Genitourinary:  Negative for dysuria, flank pain, frequency, hematuria and urgency.  Musculoskeletal:  Negative for back pain, joint pain and myalgias.  Skin:  Negative for rash.  Neurological:  Negative for dizziness, tingling, focal weakness, seizures, weakness and headaches.  Endo/Heme/Allergies:  Does not bruise/bleed easily.  Psychiatric/Behavioral:  Negative for depression and suicidal ideas. The patient does not have insomnia.       Allergies  Allergen Reactions   Shellfish Allergy Anaphylaxis    Only to crab meat (non-imitation), not shrimp   Sulfasalazine Anaphylaxis and Hives    Other reaction(s): Other (See Comments)   Lisinopril     unknown     Past Medical History:  Diagnosis Date   Abnormal thyroid  stimulating hormone (TSH) level    Anemia    Anxiety    Asthma    Bipolar 1 disorder (HCC)    COPD (chronic obstructive pulmonary disease) (HCC)    Endometriosis  GERD (gastroesophageal reflux disease)    Hepatitis A    Hypertension    Hypothyroidism    Malignant neoplasm of  upper-inner quadrant of left breast in female, 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   Personal history of radiation therapy    Pre-diabetes    PTSD (post-traumatic stress disorder)    Schizoaffective disorder [facility])      Past Surgical History:  Procedure Laterality Date   BREAST BIOPSY Left 03/03/2022   Left breast stereo bx Ribbon Clip- path pending   BREAST BIOPSY Left 03/03/2022   MM LT BREAST BX W LOC DEV 1ST LESION IMAGE BX SPEC STEREO GUIDE 03/03/2022 [facility]   BREAST LUMPECTOMY Left 03/20/2022   ECTOPIC PREGNANCY SURGERY     IR IMAGING GUIDED PORT INSERTION  11/11/2023   PART MASTECTOMY,RADIO FREQUENCY LOCALIZER,AXILLARY SENTINEL NODE BIOPSY Left 03/20/2022   Procedure: PART MASTECTOMY,RADIO FREQUENCY LOCALIZER,AXILLARY SENTINEL NODE BIOPSY;  Surgeon: Rodolph Romano, MD;  Location: [facility] ORS;  Service: General;  Laterality: Left;   TOTAL ABDOMINAL HYSTERECTOMY W/ BILATERAL SALPINGOOPHORECTOMY  1990    Social History   Socioeconomic History   Marital status: Single    Spouse name: Not on file   Number of children: 0   Years of education: Not on file   Highest education level: Associate degree: occupational, Scientist, product/process development, or vocational program  Occupational History   Not on file  Tobacco Use   Smoking status: Every Day    Current packs/day: 0.50    Average packs/day: 0.5 packs/day for 54.9 years (27.4 ttl pk-yrs)    Types: Cigarettes    Start date: 01/22/1969   Smokeless tobacco: Never   Tobacco comments:    Continues to go down on number per day to 5-7 per day.   Vaping Use   Vaping status: Never Used  Substance and Sexual Activity   Alcohol use: Not Currently    Alcohol/week: 1.0 - 2.0 standard drink of alcohol    Types: 1 Glasses of wine per week    Comment: occasional   Drug use: No   Sexual activity: Not Currently    Birth control/protection: None  Other Topics Concern   Not on file  Social History Narrative   Lives alone   Social Drivers  of Health   Financial Resource Strain: Medium Risk (08/03/2023)   Received from [facility]   Overall Financial Resource Strain (CARDIA)    Difficulty of Paying Living Expenses: Somewhat hard  Food Insecurity: Food Insecurity Present (11/15/2023)   Hunger Vital Sign    Worried About Running Out of Food in the Last Year: Sometimes true    Ran Out of Food in the Last Year: Sometimes true  Transportation Needs: No Transportation Needs (08/19/2023)   PRAPARE - Administrator, Civil Service (Medical): No    Lack of Transportation (Non-Medical): No  Physical Activity: Inactive (06/15/2017)   Exercise Vital Sign    Days of Exercise per Week: 0 days    Minutes of Exercise per Session: 0 min  Stress: Stress Concern Present (06/15/2017)   [employer] of Occupational Health - Occupational Stress Questionnaire    Feeling of Stress : Very much  Social Connections: Socially Isolated (06/15/2017)   Social Connection and Isolation Panel    Frequency of Communication with Friends and Family: Never    Frequency of Social Gatherings with Friends and Family: Never    Attends Religious Services: Never    Database administrator or Organizations: No  Attends Banker Meetings: Never    Marital Status: Divorced  Catering manager Violence: Not At Risk (08/19/2023)   Humiliation, Afraid, Rape, and Kick questionnaire    Fear of Current or Ex-Partner: No    Emotionally Abused: No    Physically Abused: No    Sexually Abused: No    Family History  Problem Relation Age of Onset   Hypertension Father    Diabetes Father    Heart disease Father    Blindness Father    Alcohol abuse Sister    Drug abuse Sister    Anxiety disorder Sister    Depression Sister    Obesity Brother    Arthritis Brother    Alcohol abuse Brother    Drug abuse Brother    Depression Brother    Breast cancer Cousin    Clotting disorder Neg Hx      Current Outpatient Medications:     albuterol  (VENTOLIN  HFA) 108 (90 Base) MCG/ACT inhaler, 2 puffs Inhalation every 4-6 hrs for 30 days for shortness of breath or wheezing, Disp: , Rfl:    alendronate (FOSAMAX) 70 MG tablet, Take 70 mg by mouth once a week., Disp: , Rfl:    amLODipine  (NORVASC ) 10 MG tablet, 1 tablet Orally Once a day for 100 days for high blood pressure, Disp: , Rfl:    atorvastatin (LIPITOR) 20 MG tablet, Take 20 mg by mouth at bedtime., Disp: , Rfl:    buPROPion  (WELLBUTRIN  XL) 150 MG 24 hr tablet, TAKE 1 TABLET BY MOUTH DAILY ALONG WITH 300 MG TABLET FOR TOTAL OF 450 MG DAILY, Disp: 90 tablet, Rfl: 1   buPROPion  (WELLBUTRIN  XL) 300 MG 24 hr tablet, Take 1 tablet (300 mg total) by mouth daily. Take along with 150 mg daily , total of 450 mg daily, Disp: 90 tablet, Rfl: 1   Calcium  Carb-Cholecalciferol 500-10 MG-MCG TABS, Take 1 tablet by mouth., Disp: , Rfl:    calcium -vitamin D  (OSCAL WITH D) 500-200 MG-UNIT tablet, Take 1 tablet by mouth daily with breakfast., Disp: , Rfl:    clonazePAM  (KLONOPIN ) 0.5 MG tablet, Take 1 tablet (0.5 mg total) by mouth daily as needed for anxiety. Please use it only for severe panic attacks, do not combine with Oxycodone , Disp: 15 tablet, Rfl: 0   dexamethasone  (DECADRON ) 4 MG tablet, Take 2 tablets daily x 3 days starting the day after chemotherapy. Take with food., Disp: 30 tablet, Rfl: 1   furosemide  (LASIX ) 20 MG tablet, 1 tablet Orally Once a day for 100 days high blood pressure, Disp: , Rfl:    hydrOXYzine  (ATARAX ) 10 MG tablet, Take 2 tablets (20 mg total) by mouth at bedtime as needed. for sleep and anxiety, Disp: 180 tablet, Rfl: 0   Lactulose 20 GM/30ML SOLN, 15 mL Orally twice a day for 30 days as needed for constipation, Disp: , Rfl:    letrozole  (FEMARA ) 2.5 MG tablet, Take 1 tablet (2.5 mg total) by mouth daily., Disp: 30 tablet, Rfl: 3   levocetirizine (XYZAL) 5 MG tablet, 1 tablet in the evening Orally Once a day for 100 days as needed for allergy symptoms, Disp: ,  Rfl:    levothyroxine  (SYNTHROID , LEVOTHROID) 112 MCG tablet, , Disp: , Rfl:    lidocaine -prilocaine  (EMLA ) cream, Apply 1 Application topically as needed. Apply a small amount to port site 1 hour before chemotherapy, Disp: 30 g, Rfl: 2   OLANZapine  (ZYPREXA ) 5 MG tablet, Take 1 tablet (5 mg total) by mouth at  bedtime., Disp: 90 tablet, Rfl: 3   omeprazole (PRILOSEC) 40 MG capsule, Take 40 mg by mouth daily. , Disp: , Rfl:    ondansetron  (ZOFRAN ) 8 MG tablet, Take 1 tablet (8 mg total) by mouth every 8hrs as needed for nausea or vomiting. Start on the third day after cisplatin ., Disp: 30 tablet, Rfl: 1   oxyCODONE  (OXY IR/ROXICODONE ) 5 MG immediate release tablet, Take 0.5-1 tablets (2.5-5 mg total) by mouth every 8 (eight) hours as needed for severe pain (pain score 7-10) (for pain unrelieved by tylenol )., Disp: 30 tablet, Rfl: 0   potassium chloride  SA (KLOR-CON  M) 20 MEQ tablet, Take 1 tablet (20 mEq total) by mouth daily. For low potassium, Disp: 30 tablet, Rfl: 0   prazosin  (MINIPRESS ) 2 MG capsule, TAKE ONE CAPSULE BY MOUTH AT BEDTIME., Disp: 90 capsule, Rfl: 1   prochlorperazine  (COMPAZINE ) 10 MG tablet, Take 1 tablet (10 mg total) by mouth every 6 (six) hours as needed for nausea or vomiting., Disp: 30 tablet, Rfl: 1   tiotropium (SPIRIVA ) 18 MCG inhalation capsule, Place 18 mcg into inhaler and inhale daily., Disp: , Rfl:    traZODone  (DESYREL ) 50 MG tablet, Take 1 tablet (50 mg total) by mouth at bedtime., Disp: 30 tablet, Rfl: 0   varenicline  (CHANTIX ) 0.5 MG tablet, Take 1 tablet (0.5 mg total) by mouth 2 (two) times daily., Disp: 60 tablet, Rfl: 1   fluticasone  (FLONASE ) 50 MCG/ACT nasal spray, Place into both nostrils as needed for allergies or rhinitis. (Patient not taking: Reported on 12/07/2023), Disp: , Rfl:  No current facility-administered medications for this visit.  Facility-Administered Medications Ordered in Other Visits:    0.9 %  sodium chloride  infusion, , Intravenous,  Continuous, [name], MD, Last Rate: 10 mL/hr at 12/07/23 1035, New Bag at 12/07/23 1035   CISplatin  (PLATINOL ) 57 mg in sodium chloride  0.9 % 250 mL chemo infusion, 30 mg/m2 (Treatment Plan Recorded), Intravenous, Once, [name], MD  Physical exam:  Vitals:   12/07/23 0946  BP: 128/63  Pulse: 73  Resp: 19  Temp: (!) 96.6 F (35.9 C)  TempSrc: Tympanic  SpO2: 99%  Weight: 185 lb 3.2 oz (84 kg)  Height: 5' 1 (1.549 m)   Physical Exam Cardiovascular:     Rate and Rhythm: Normal rate and regular rhythm.     Heart sounds: Normal heart sounds.  Pulmonary:     Effort: Pulmonary effort is normal.     Breath sounds: Normal breath sounds.  Skin:    General: Skin is warm and dry.  Neurological:     Mental Status: She is alert and oriented to person, place, and time.      I have personally reviewed labs listed below:    Latest Ref Rng & Units 12/07/2023    9:30 AM  CMP  Glucose 70 - 99 mg/dL 848   BUN 8 - 23 mg/dL 16   Creatinine 9.55 - 1.00 mg/dL 8.94   Sodium 864 - 854 mmol/L 126   Potassium 3.5 - 5.1 mmol/L 4.1   Chloride 98 - 111 mmol/L 91   CO2 22 - 32 mmol/L 27   Calcium  8.9 - 10.3 mg/dL 8.5       Latest Ref Rng & Units 12/07/2023    9:30 AM  CBC  WBC 4.0 - 10.5 K/uL 4.6   Hemoglobin 12.0 - 15.0 g/dL 88.8   Hematocrit 63.9 - 46.0 % 31.7   Platelets 150 - 400 K/uL 86  I have personally reviewed Radiology images listed below: No images are attached to the encounter.  US  Abdomen Limited RUQ (LIVER/GB) Result Date: 11/23/2023 CLINICAL DATA:  Transaminitis. EXAM: ULTRASOUND ABDOMEN LIMITED RIGHT UPPER QUADRANT COMPARISON:  Renal ultrasound 09/23/2021. Abdominopelvic CT 11/03/2011. PET-CT 09/10/2023. FINDINGS: Gallbladder: No gallstones or wall thickening visualized. No sonographic Murphy sign noted by sonographer. Common bile duct: Diameter: 3 mm.  No intrahepatic biliary dilatation. Liver: No focal lesion identified. Within normal limits in parenchymal  echogenicity. Portal vein is patent on color Doppler imaging with normal direction of blood flow towards the liver. Other: No right upper quadrant abdominal ascites demonstrated. IMPRESSION: Unremarkable right upper quadrant abdominal ultrasound. Electronically Signed   By: Elsie Perone M.D.   On: 11/23/2023 11:14   IR IMAGING GUIDED PORT INSERTION Result Date: 11/11/2023 INDICATION: History of GYN malignancy.  Poor IV access.  Chemotherapy. EXAM: IMPLANTED PORT A CATH PLACEMENT WITH ULTRASOUND AND FLUOROSCOPIC GUIDANCE MEDICATIONS: None ANESTHESIA/SEDATION: Moderate (conscious) sedation was employed during this procedure. A total of Versed  2 mg and Fentanyl  100 mcg was administered intravenously. Moderate Sedation Time: 25 minutes. The patient's level of consciousness and vital signs were monitored continuously by radiology nursing throughout the procedure under my direct supervision. FLUOROSCOPY: Radiation Exposure Index and estimated peak skin dose (PSD); Reference air kerma (RAK), 1 mGy. COMPLICATIONS: None immediate. PROCEDURE: The procedure, risks, benefits, and alternatives were explained to the patient. Questions regarding the procedure were encouraged and answered. The patient understands and consents to the procedure. The RIGHT neck and chest were prepped with chlorhexidine  in a sterile fashion, and a sterile drape was applied covering the operative field. Maximum barrier sterile technique with sterile gowns and gloves were used for the procedure. A timeout was performed prior to the initiation of the procedure. Local anesthesia was provided with 1% lidocaine  with epinephrine . After creating a small venotomy incision, a micropuncture kit was utilized to access the internal jugular vein under direct, real-time ultrasound guidance. Ultrasound image documentation was performed. The microwire was kinked to measure appropriate catheter length. A subcutaneous port pocket was then created along the upper  chest wall utilizing a combination of sharp and blunt dissection. The pocket was irrigated with sterile saline. A single lumen power injectable port was chosen for placement. The 8 Fr catheter was tunneled from the port pocket site to the venotomy incision. The port was placed in the pocket. The external catheter was trimmed to appropriate length. At the venotomy, an 8 Fr peel-away sheath was placed over a guidewire under fluoroscopic guidance. The catheter was then placed through the sheath and the sheath was removed. Final catheter positioning was confirmed and documented with a fluoroscopic spot radiograph. The port was accessed with a Huber needle, aspirated and flushed with heparinized saline. The port pocket incision was closed with interrupted 3-0 Vicryl suture then Dermabond was applied, including at the venotomy incision. Dressings were placed. The patient tolerated the procedure well without immediate post procedural complication. IMPRESSION: Successful placement of a RIGHT internal jugular approach power injectable Port-A-Cath. The tip of the catheter is positioned within the proximal RIGHT atrium. The catheter is ready for immediate use. Thom Hall, MD [facility] Radiology Electronically Signed   By: Thom Hall M.D.   On: 11/11/2023 12:06     Assessment and plan- Patient is a 63 y.o. 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  She is here for  on treatment assessment prior to cycle 4 of weekly cisplatin  chemotherapy  Platelets are low at 86 likely secondary to cisplatin .  I am reducing the dose from 40 mg/m to 30 mg a meter squared.  Okay to continue with chemo as long as platelets are more than 75.  She will directly proceed for cycle 5 of chemotherapy next week and I will see her back in 2 weeks for cycle 6.  Abnormal LFTs: Will repeat levels today  Hyponatremia: We will  give her 1 L of fluids tomorrow   Visit Diagnosis 1. Vulvar cancer (HCC)   2. Encounter for antineoplastic chemotherapy   3. Chemotherapy-induced thrombocytopenia      Dr. Annah Skene, MD, MPH [facility] [phone] 12/07/2023 1:24 PM

## 2023-12-07 NOTE — Progress Notes (Signed)
 Patient states she needs clearance for her MAMMO. She states that she isn't sure if they will do it while she still has her port.

## 2023-12-07 NOTE — Progress Notes (Signed)
 Ok to treat with plt 86 as cisplatin  dose reduced 30mg /m2 per dr Melanee

## 2023-12-08 ENCOUNTER — Other Ambulatory Visit: Payer: Self-pay

## 2023-12-08 ENCOUNTER — Inpatient Hospital Stay

## 2023-12-08 ENCOUNTER — Ambulatory Visit

## 2023-12-08 ENCOUNTER — Encounter: Payer: Self-pay | Admitting: Nurse Practitioner

## 2023-12-08 ENCOUNTER — Inpatient Hospital Stay (HOSPITAL_BASED_OUTPATIENT_CLINIC_OR_DEPARTMENT_OTHER): Admitting: Nurse Practitioner

## 2023-12-08 ENCOUNTER — Ambulatory Visit
Admission: RE | Admit: 2023-12-08 | Discharge: 2023-12-08 | Disposition: A | Discharge status: Home / Self Care | Source: Ambulatory Visit | Attending: Radiation Oncology | Admitting: Radiation Oncology

## 2023-12-08 VITALS — BP 125/72 | HR 78 | Temp 96.3°F | Resp 16

## 2023-12-08 DIAGNOSIS — Z510 Encounter for antineoplastic radiation therapy: Secondary | ICD-10-CM | POA: Diagnosis not present

## 2023-12-08 DIAGNOSIS — C519 Malignant neoplasm of vulva, unspecified: Secondary | ICD-10-CM

## 2023-12-08 DIAGNOSIS — L0232 Furuncle of buttock: Secondary | ICD-10-CM | POA: Diagnosis not present

## 2023-12-08 LAB — RAD ONC ARIA SESSION SUMMARY
Course Elapsed Days: 22
Plan Fractions Treated to Date: 16
Plan Prescribed Dose Per Fraction: 1.8 Gy
Plan Total Fractions Prescribed: 36
Plan Total Prescribed Dose: 64.8 Gy
Reference Point Dosage Given to Date: 28.8 Gy
Reference Point Session Dosage Given: 1.8 Gy
Session Number: 16

## 2023-12-08 MED ORDER — CEPHALEXIN 500 MG PO CAPS: 500.0000 mg | ORAL_CAPSULE | Freq: Two times a day (BID) | ORAL | 0 refills | Status: AC

## 2023-12-08 MED ORDER — SODIUM CHLORIDE 0.9 % IV SOLN
Freq: Once | INTRAVENOUS | Status: AC
  Filled 2023-12-08: qty 250

## 2023-12-08 NOTE — Progress Notes (Signed)
 Patient states she had some spotting today while wiping after urinating. This is the first time patient has seen blood. Denies any spotting prior to today.  Patient denies blood in urine. She states she also seen a little bit of yellowish discharge while wiping. Patient states she has spots of blood in her underwear as well. Patient states she went through menopause between the ages of 40- 21 and was concerned when she seen blood today.

## 2023-12-08 NOTE — Progress Notes (Signed)
 Symptom Management Clinic  [facility] at [facility] A Department of the [place]. [facility] [address] [place], [place] [address] [phone] (phone) [phone] (fax)  Patient Care Team: Entzminger, Ethridge LABOR, MD as PCP - General (Internal Medicine) [name] Shasta POUR, RN as Oncology Nurse Navigator Maurie Rayfield BIRCH, RN as Oncology Nurse Navigator [name], MD as Consulting Physician (Oncology) Lenn Aran, MD as Consulting Physician (Radiation Oncology)   Name of the patient: Patricia Good  [id number]  [birth year]   Date of visit: 12/08/23  Diagnosis- Vulvar Cancer  Chief complaint/ Reason for visit- vaginal spotting  Heme/Onc history:  Oncology History  Malignant neoplasm of upper-inner quadrant of left breast in female, estrogen receptor positive (HCC)  03/10/2022 Initial Diagnosis   Malignant neoplasm of upper-inner quadrant of left breast in female, estrogen receptor positive (HCC)   03/10/2022 Cancer Staging   Staging form: Breast, AJCC 8th Edition - Clinical stage from 03/10/2022: Stage IA (cT1a, cN0, cM0, G2, ER+, PR+, HER2-) - Signed by [name], MD on 03/10/2022 Histologic grading system: 3 grade system   04/07/2022 Cancer Staging   Staging form: Breast, AJCC 8th Edition - Pathologic stage from 04/07/2022: Stage IA (pT1a, pN0, cM0, G3, ER+, PR+, HER2-, Oncotype DX score: 28) - Signed by [name], MD on 04/13/2022 Stage prefix: Initial diagnosis Multigene prognostic tests performed: Oncotype DX Recurrence score range: Greater than or equal to 11 Histologic grading system: 3 grade system   Vulvar cancer (HCC)  08/09/2023 Initial Diagnosis   Vulvar cancer (HCC)   10/11/2023 Cancer Staging   Staging form: Vulva, AJCC V9 - Clinical stage from 10/11/2023: FIGO Stage IIIB (cT2, cN1b, cM0) - Signed by [name], MD on 10/11/2023 Stage prefix: Initial diagnosis   11/15/2023 -  Chemotherapy   Patient is on  Treatment Plan : Vulvar carcinoma Cisplatin  (40) q7d + XRT       Interval history- Patricia Good is a 63 y.o. female who presents to symptom management clinic for complaints of vaginal bleeding. She's noticed pin point spots of blood on her underwear for past few days. Pain is improving. She says she had exam with Dr Lenn yesterday.   Review of systems- Review of Systems  Constitutional:  Negative for fever and malaise/fatigue.  Gastrointestinal:  Negative for blood in stool and melena.  Genitourinary:  Negative for hematuria.  Skin:  Negative for itching and rash.  Neurological:  Negative for weakness.    Current treatment- concurrent chemo-radiation  Allergies  Allergen Reactions   Shellfish Allergy Anaphylaxis    Only to crab meat (non-imitation), not shrimp   Sulfasalazine Anaphylaxis and Hives    Other reaction(s): Other (See Comments)   Lisinopril     unknown   Past Medical History:  Diagnosis Date   Abnormal thyroid  stimulating hormone (TSH) level    Anemia    Anxiety    Asthma    Bipolar 1 disorder (HCC)    COPD (chronic obstructive pulmonary disease) (HCC)    Endometriosis    GERD (gastroesophageal reflux disease)    Hepatitis A    Hypertension    Hypothyroidism    Malignant neoplasm of upper-inner quadrant of left breast in female, 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   Personal history of radiation therapy    Pre-diabetes    PTSD (post-traumatic stress disorder)    Schizoaffective disorder (HCC)    Past Surgical History:  Procedure Laterality Date   BREAST  BIOPSY Left 03/03/2022   Left breast stereo bx Ribbon Clip- path pending   BREAST BIOPSY Left 03/03/2022   MM LT BREAST BX W LOC DEV 1ST LESION IMAGE BX SPEC STEREO GUIDE 03/03/2022 [facility]   BREAST LUMPECTOMY Left 03/20/2022   ECTOPIC PREGNANCY SURGERY     IR IMAGING GUIDED PORT INSERTION  11/11/2023   PART MASTECTOMY,RADIO FREQUENCY LOCALIZER,AXILLARY SENTINEL NODE BIOPSY Left  03/20/2022   Procedure: PART MASTECTOMY,RADIO FREQUENCY LOCALIZER,AXILLARY SENTINEL NODE BIOPSY;  Surgeon: Rodolph Romano, MD;  Location: [facility] ORS;  Service: General;  Laterality: Left;   TOTAL ABDOMINAL HYSTERECTOMY W/ BILATERAL SALPINGOOPHORECTOMY  1990   Social History   Socioeconomic History   Marital status: Single    Spouse name: Not on file   Number of children: 0   Years of education: Not on file   Highest education level: Associate degree: occupational, Scientist, product/process development, or vocational program  Occupational History   Not on file  Tobacco Use   Smoking status: Every Day    Current packs/day: 0.50    Average packs/day: 0.5 packs/day for 54.9 years (27.4 ttl pk-yrs)    Types: Cigarettes    Start date: 01/22/1969   Smokeless tobacco: Never   Tobacco comments:    Continues to go down on number per day to 5-7 per day.   Vaping Use   Vaping status: Never Used  Substance and Sexual Activity   Alcohol use: Not Currently    Alcohol/week: 1.0 - 2.0 standard drink of alcohol    Types: 1 Glasses of wine per week    Comment: occasional   Drug use: No   Sexual activity: Not Currently    Birth control/protection: None  Other Topics Concern   Not on file  Social History Narrative   Lives alone   Social Drivers of Health   Financial Resource Strain: Medium Risk (08/03/2023)   Received from [facility]   Overall Financial Resource Strain (CARDIA)    Difficulty of Paying Living Expenses: Somewhat hard  Food Insecurity: Food Insecurity Present (11/15/2023)   Hunger Vital Sign    Worried About Running Out of Food in the Last Year: Sometimes true    Ran Out of Food in the Last Year: Sometimes true  Transportation Needs: No Transportation Needs (08/19/2023)   PRAPARE - Administrator, Civil Service (Medical): No    Lack of Transportation (Non-Medical): No  Physical Activity: Inactive (06/15/2017)   Exercise Vital Sign    Days of Exercise per Week: 0 days     Minutes of Exercise per Session: 0 min  Stress: Stress Concern Present (06/15/2017)   [employer] of Occupational Health - Occupational Stress Questionnaire    Feeling of Stress : Very much  Social Connections: Socially Isolated (06/15/2017)   Social Connection and Isolation Panel    Frequency of Communication with Friends and Family: Never    Frequency of Social Gatherings with Friends and Family: Never    Attends Religious Services: Never    Database administrator or Organizations: No    Attends Banker Meetings: Never    Marital Status: Divorced  Catering manager Violence: Not At Risk (08/19/2023)   Humiliation, Afraid, Rape, and Kick questionnaire    Fear of Current or Ex-Partner: No    Emotionally Abused: No    Physically Abused: No    Sexually Abused: No    Family History  Problem Relation Age of Onset   Hypertension Father    Diabetes Father  Heart disease Father    Blindness Father    Alcohol abuse Sister    Drug abuse Sister    Anxiety disorder Sister    Depression Sister    Obesity Brother    Arthritis Brother    Alcohol abuse Brother    Drug abuse Brother    Depression Brother    Breast cancer Cousin    Clotting disorder Neg Hx      Current Outpatient Medications:    albuterol  (VENTOLIN  HFA) 108 (90 Base) MCG/ACT inhaler, 2 puffs Inhalation every 4-6 hrs for 30 days for shortness of breath or wheezing, Disp: , Rfl:    alendronate (FOSAMAX) 70 MG tablet, Take 70 mg by mouth once a week., Disp: , Rfl:    amLODipine  (NORVASC ) 10 MG tablet, 1 tablet Orally Once a day for 100 days for high blood pressure, Disp: , Rfl:    atorvastatin (LIPITOR) 20 MG tablet, Take 20 mg by mouth at bedtime., Disp: , Rfl:    buPROPion  (WELLBUTRIN  XL) 150 MG 24 hr tablet, TAKE 1 TABLET BY MOUTH DAILY ALONG WITH 300 MG TABLET FOR TOTAL OF 450 MG DAILY, Disp: 90 tablet, Rfl: 1   buPROPion  (WELLBUTRIN  XL) 300 MG 24 hr tablet, Take 1 tablet (300 mg total) by mouth  daily. Take along with 150 mg daily , total of 450 mg daily, Disp: 90 tablet, Rfl: 1   Calcium  Carb-Cholecalciferol 500-10 MG-MCG TABS, Take 1 tablet by mouth., Disp: , Rfl:    calcium -vitamin D  (OSCAL WITH D) 500-200 MG-UNIT tablet, Take 1 tablet by mouth daily with breakfast., Disp: , Rfl:    clonazePAM  (KLONOPIN ) 0.5 MG tablet, Take 1 tablet (0.5 mg total) by mouth daily as needed for anxiety. Please use it only for severe panic attacks, do not combine with Oxycodone , Disp: 15 tablet, Rfl: 0   dexamethasone  (DECADRON ) 4 MG tablet, Take 2 tablets daily x 3 days starting the day after chemotherapy. Take with food., Disp: 30 tablet, Rfl: 1   furosemide  (LASIX ) 20 MG tablet, 1 tablet Orally Once a day for 100 days high blood pressure, Disp: , Rfl:    hydrOXYzine  (ATARAX ) 10 MG tablet, Take 2 tablets (20 mg total) by mouth at bedtime as needed. for sleep and anxiety, Disp: 180 tablet, Rfl: 0   Lactulose 20 GM/30ML SOLN, 15 mL Orally twice a day for 30 days as needed for constipation, Disp: , Rfl:    letrozole  (FEMARA ) 2.5 MG tablet, TAKE 1 TABLET BY MOUTH DAILY, Disp: 90 tablet, Rfl: 3   levocetirizine (XYZAL) 5 MG tablet, 1 tablet in the evening Orally Once a day for 100 days as needed for allergy symptoms, Disp: , Rfl:    levothyroxine  (SYNTHROID , LEVOTHROID) 112 MCG tablet, , Disp: , Rfl:    lidocaine -prilocaine  (EMLA ) cream, Apply 1 Application topically as needed. Apply a small amount to port site 1 hour before chemotherapy, Disp: 30 g, Rfl: 2   OLANZapine  (ZYPREXA ) 5 MG tablet, Take 1 tablet (5 mg total) by mouth at bedtime., Disp: 90 tablet, Rfl: 3   omeprazole (PRILOSEC) 40 MG capsule, Take 40 mg by mouth daily. , Disp: , Rfl:    ondansetron  (ZOFRAN ) 8 MG tablet, Take 1 tablet (8 mg total) by mouth every 8hrs as needed for nausea or vomiting. Start on the third day after cisplatin ., Disp: 30 tablet, Rfl: 1   oxyCODONE  (OXY IR/ROXICODONE ) 5 MG immediate release tablet, Take 0.5-1 tablets (2.5-5  mg total) by mouth every 8 (eight) hours  as needed for severe pain (pain score 7-10) (for pain unrelieved by tylenol )., Disp: 30 tablet, Rfl: 0   potassium chloride  SA (KLOR-CON  M) 20 MEQ tablet, Take 1 tablet (20 mEq total) by mouth daily. For low potassium, Disp: 30 tablet, Rfl: 0   prazosin  (MINIPRESS ) 2 MG capsule, TAKE ONE CAPSULE BY MOUTH AT BEDTIME., Disp: 90 capsule, Rfl: 1   prochlorperazine  (COMPAZINE ) 10 MG tablet, Take 1 tablet (10 mg total) by mouth every 6 (six) hours as needed for nausea or vomiting., Disp: 30 tablet, Rfl: 1   tiotropium (SPIRIVA ) 18 MCG inhalation capsule, Place 18 mcg into inhaler and inhale daily., Disp: , Rfl:    traZODone  (DESYREL ) 50 MG tablet, Take 1 tablet (50 mg total) by mouth at bedtime., Disp: 30 tablet, Rfl: 0   varenicline  (CHANTIX ) 0.5 MG tablet, Take 1 tablet (0.5 mg total) by mouth 2 (two) times daily., Disp: 60 tablet, Rfl: 1   fluticasone  (FLONASE ) 50 MCG/ACT nasal spray, Place into both nostrils as needed for allergies or rhinitis. (Patient not taking: Reported on 12/08/2023), Disp: , Rfl:   Physical exam:  Vitals:   12/08/23 1219  BP: 125/72  Pulse: 78  Resp: 16  Temp: (!) 96.3 F (35.7 C)  SpO2: 99%   Physical Exam Vitals reviewed.  Constitutional:      General: She is not in acute distress.    Appearance: She is well-developed. She is not ill-appearing.  HENT:     Head: Atraumatic.     Nose: Nose normal.  Eyes:     General: No scleral icterus. Abdominal:     General: There is no distension.     Palpations: Abdomen is soft.     Tenderness: There is no abdominal tenderness.  Musculoskeletal:        General: No tenderness or deformity.     Cervical back: Normal range of motion and neck supple.  Skin:    General: Skin is warm and dry.  Neurological:     Mental Status: She is alert and oriented to person, place, and time.  Psychiatric:        Mood and Affect: Mood normal.        Behavior: Behavior normal.     Assessment and  plan- Patient is a 63 y.o. female who presents to symptomatic in clinic for:  Vulvar spotting- related to her known malignancy. Minor spotting from vulva. Cnotinue treatment.  Boil- left buttock. Asymptomatic. Given active radiation and chemo will cover empirically with cephalexin  500 mg BID x 5 days. Hold off on I&D. Can use warm moist compresses.   Follow up with med-onc and rad-onc as scheduled. RTC sooner if symptoms do not improve or worsen.    Visit Diagnosis 1. Boil of buttock   2. Vulvar cancer [facility])    Patient expressed understanding and was in agreement with this plan. She also understands that She can call clinic at any time with any questions, concerns, or complaints.   Thank you for allowing me to participate in the care of this very pleasant patient.   Tinnie Dawn, DNP, AGNP-C, AOCNP [facility] Center at [facility] [phone]

## 2023-12-09 ENCOUNTER — Ambulatory Visit

## 2023-12-10 ENCOUNTER — Ambulatory Visit

## 2023-12-10 ENCOUNTER — Telehealth: Payer: Self-pay | Admitting: *Deleted

## 2023-12-10 NOTE — Telephone Encounter (Signed)
 Called patient to inform her that her platelet count was low (86) and that she would be on break until Tuesday morning with labs before treatment.

## 2023-12-13 ENCOUNTER — Ambulatory Visit

## 2023-12-13 MED FILL — Fosaprepitant Dimeglumine For IV Infusion 150 MG (Base Eq): INTRAVENOUS | Qty: 5 | Status: AC

## 2023-12-14 ENCOUNTER — Ambulatory Visit: Admission: RE | Admit: 2023-12-14 | Source: Ambulatory Visit

## 2023-12-14 ENCOUNTER — Ambulatory Visit

## 2023-12-14 ENCOUNTER — Other Ambulatory Visit: Payer: Self-pay | Admitting: *Deleted

## 2023-12-14 ENCOUNTER — Inpatient Hospital Stay

## 2023-12-14 ENCOUNTER — Telehealth: Payer: Self-pay | Admitting: Pharmacy Technician

## 2023-12-14 ENCOUNTER — Other Ambulatory Visit: Payer: Self-pay | Admitting: Oncology

## 2023-12-14 DIAGNOSIS — C519 Malignant neoplasm of vulva, unspecified: Secondary | ICD-10-CM

## 2023-12-14 DIAGNOSIS — Z510 Encounter for antineoplastic radiation therapy: Secondary | ICD-10-CM | POA: Diagnosis not present

## 2023-12-14 LAB — CBC WITH DIFFERENTIAL (CANCER CENTER ONLY)
Abs Immature Granulocytes: 0.02 K/uL (ref 0.00–0.07)
Basophils Absolute: 0 K/uL (ref 0.0–0.1)
Basophils Relative: 0 %
Eosinophils Absolute: 0 K/uL (ref 0.0–0.5)
Eosinophils Relative: 1 %
HCT: 29.6 % — ABNORMAL LOW (ref 36.0–46.0)
Hemoglobin: 10.5 g/dL — ABNORMAL LOW (ref 12.0–15.0)
Immature Granulocytes: 1 %
Lymphocytes Relative: 28 %
Lymphs Abs: 0.7 K/uL (ref 0.7–4.0)
MCH: 36.1 pg — ABNORMAL HIGH (ref 26.0–34.0)
MCHC: 35.5 g/dL (ref 30.0–36.0)
MCV: 101.7 fL — ABNORMAL HIGH (ref 80.0–100.0)
Monocytes Absolute: 0.3 K/uL (ref 0.1–1.0)
Monocytes Relative: 12 %
Neutro Abs: 1.5 K/uL — ABNORMAL LOW (ref 1.7–7.7)
Neutrophils Relative %: 58 %
Platelet Count: 57 K/uL — ABNORMAL LOW (ref 150–400)
RBC: 2.91 MIL/uL — ABNORMAL LOW (ref 3.87–5.11)
RDW: 13 % (ref 11.5–15.5)
WBC Count: 2.6 K/uL — ABNORMAL LOW (ref 4.0–10.5)
nRBC: 0 % (ref 0.0–0.2)

## 2023-12-14 LAB — BASIC METABOLIC PANEL - CANCER CENTER ONLY
Anion gap: 10 (ref 5–15)
BUN: 14 mg/dL (ref 8–23)
CO2: 27 mmol/L (ref 22–32)
Calcium: 8.2 mg/dL — ABNORMAL LOW (ref 8.9–10.3)
Chloride: 92 mmol/L — ABNORMAL LOW (ref 98–111)
Creatinine: 1.25 mg/dL — ABNORMAL HIGH (ref 0.44–1.00)
GFR, Estimated: 48 mL/min — ABNORMAL LOW (ref >60)
Glucose, Bld: 91 mg/dL (ref 70–99)
Potassium: 3.7 mmol/L (ref 3.5–5.1)
Sodium: 129 mmol/L — ABNORMAL LOW (ref 135–145)

## 2023-12-14 LAB — MAGNESIUM: Magnesium: 1.5 mg/dL — ABNORMAL LOW (ref 1.7–2.4)

## 2023-12-14 MED ORDER — MAGNESIUM SULFATE 2 GM/50ML IV SOLN
2.0000 g | Freq: Once | INTRAVENOUS | Status: AC
  Administered 2023-12-14: 2 g via INTRAVENOUS
  Filled 2023-12-14: qty 50

## 2023-12-14 MED ORDER — SODIUM CHLORIDE 0.9 % IV SOLN
Freq: Once | INTRAVENOUS | Status: AC
  Filled 2023-12-14: qty 250

## 2023-12-14 NOTE — Patient Instructions (Signed)
 Magnesium Sulfate Injection What is this medication? MAGNESIUM SULFATE (mag NEE zee um SUL fate) prevents and treats low levels of magnesium in your body. It may also be used to prevent and treat seizures during pregnancy in people with high blood pressure disorders, such as preeclampsia or eclampsia. Magnesium plays an important role in maintaining the health of your muscles and nervous system. This medicine may be used for other purposes; ask your health care provider or pharmacist if you have questions. What should I tell my care team before I take this medication? They need to know if you have any of these conditions: Heart disease History of irregular heart beat Kidney disease An unusual or allergic reaction to magnesium sulfate, medications, foods, dyes, or preservatives Pregnant or trying to get pregnant Breast-feeding How should I use this medication? This medication is for infusion into a vein. It is given in a hospital or clinic setting. Talk to your care team about the use of this medication in children. While this medication may be prescribed for selected conditions, precautions do apply. Overdosage: If you think you have taken too much of this medicine contact a poison control center or emergency room at once. NOTE: This medicine is only for you. Do not share this medicine with others. What if I miss a dose? This does not apply. What may interact with this medication? Certain medications for anxiety or sleep Certain medications for seizures, such phenobarbital Digoxin Medications that relax muscles for surgery Narcotic medications for pain This list may not describe all possible interactions. Give your health care provider a list of all the medicines, herbs, non-prescription drugs, or dietary supplements you use. Also tell them if you smoke, drink alcohol, or use illegal drugs. Some items may interact with your medicine. What should I watch for while using this  medication? Your condition will be monitored carefully while you are receiving this medication. You may need blood work done while you are receiving this medication. What side effects may I notice from receiving this medication? Side effects that you should report to your care team as soon as possible: Allergic reactions--skin rash, itching, hives, swelling of the face, lips, tongue, or throat High magnesium level--confusion, drowsiness, facial flushing, redness, sweating, muscle weakness, fast or irregular heartbeat, trouble breathing Low blood pressure--dizziness, feeling faint or lightheaded, blurry vision Side effects that usually do not require medical attention (report to your care team if they continue or are bothersome): Headache Nausea This list may not describe all possible side effects. Call your doctor for medical advice about side effects. You may report side effects to FDA at 1-800-FDA-1088. Where should I keep my medication? This medication is given in a hospital or clinic and will not be stored at home. NOTE: This sheet is a summary. It may not cover all possible information. If you have questions about this medicine, talk to your doctor, pharmacist, or health care provider.  2024 Elsevier/Gold Standard (2020-12-04 00:00:00)

## 2023-12-14 NOTE — Progress Notes (Signed)
 [facility] CSW Progress Note  Visual merchandiser met with patient to follow-up on need for community resources.    Interventions: Provided patient with information about the [employer] process.  Patricia Good inquired about her rent being paid.  Informed Patricia Good who will follow up with patient.       Follow Up Plan:  CSW will follow-up with patient by phone     Patricia CHRISTELLA Au, LCSW Clinical Social Worker [facility]    Patient is participating in a Managed Medicaid Plan:  Yes

## 2023-12-14 NOTE — Telephone Encounter (Signed)
 Patient inquiring about when landlord will receive rental check.  Made patient aware that check request was sent to [employer] Accounts Payable Department last week and it takes about two weeks to process.  Patient to call me back if landlord has not received check within 2 weeks.  Dickey DOROTHA Fritter Patient Pharmacologist [facility]

## 2023-12-15 ENCOUNTER — Ambulatory Visit

## 2023-12-15 ENCOUNTER — Telehealth: Payer: Self-pay | Admitting: Psychiatry

## 2023-12-15 NOTE — Telephone Encounter (Signed)
 Please let patient know insurance will not cover a prescription for Chantix  since she lost her supplies.  She can wait until she is due for the next refill and she can call in the refill request she already has 1 refill pending on the previous prescription.

## 2023-12-16 ENCOUNTER — Inpatient Hospital Stay

## 2023-12-16 ENCOUNTER — Inpatient Hospital Stay (HOSPITAL_BASED_OUTPATIENT_CLINIC_OR_DEPARTMENT_OTHER): Admitting: Hospice and Palliative Medicine

## 2023-12-16 ENCOUNTER — Ambulatory Visit

## 2023-12-16 ENCOUNTER — Other Ambulatory Visit: Payer: Self-pay | Admitting: *Deleted

## 2023-12-16 DIAGNOSIS — Z515 Encounter for palliative care: Secondary | ICD-10-CM

## 2023-12-16 DIAGNOSIS — C519 Malignant neoplasm of vulva, unspecified: Secondary | ICD-10-CM

## 2023-12-16 DIAGNOSIS — Z510 Encounter for antineoplastic radiation therapy: Secondary | ICD-10-CM | POA: Diagnosis not present

## 2023-12-16 LAB — CBC (CANCER CENTER ONLY)
HCT: 29.9 % — ABNORMAL LOW (ref 36.0–46.0)
Hemoglobin: 10.5 g/dL — ABNORMAL LOW (ref 12.0–15.0)
MCH: 36.5 pg — ABNORMAL HIGH (ref 26.0–34.0)
MCHC: 35.1 g/dL (ref 30.0–36.0)
MCV: 103.8 fL — ABNORMAL HIGH (ref 80.0–100.0)
Platelet Count: 64 K/uL — ABNORMAL LOW (ref 150–400)
RBC: 2.88 MIL/uL — ABNORMAL LOW (ref 3.87–5.11)
RDW: 13.8 % (ref 11.5–15.5)
WBC Count: 2.2 K/uL — ABNORMAL LOW (ref 4.0–10.5)
nRBC: 0 % (ref 0.0–0.2)

## 2023-12-16 NOTE — Telephone Encounter (Signed)
 Called patient to make aware that her insurance will not pay for her chantix  she voiced understanding

## 2023-12-16 NOTE — Progress Notes (Signed)
 Virtual Visit via Telephone Note  I connected with Patricia Good on 12/16/23 at  2:10 PM EDT by telephone and verified that I am speaking with the correct person using two identifiers.  Location: Patient: Home Provider: Clinic   I discussed the limitations, risks, security and privacy concerns of performing an evaluation and management service by telephone and the availability of in person appointments. I also discussed with the patient that there may be a patient responsible charge related to this service. The patient expressed understanding and agreed to proceed.   History of Present Illness: Patricia Good is a 63 y.o. 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  Patient is not surgical candidate.    Observations/Objective: Called and spoke with patient by phone.  She reports she is doing reasonably well.  Denies significant changes or concerns.  States that she does remain fatigued but only about 50% of the time.  Denies pain or other distressing symptoms.  States her appetite is good.  Stable performance status.  Patient is trying to quit smoking.  Has been prescribed Chantix  by her psychiatrist but lost the bottle and was requested a refill from psychiatry.  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  She was excited about the prospects of working.  Assessment and Plan: Stage III vulvar cancer -on weekly cisplatin  the last cycle was held due to thrombocytopenia.  Patient seems to be tolerating well.  No significant symptomatic complaints or concerns today.  Follow Up Instructions: Follow-up telephone visit 2- 3 months   I discussed the assessment and treatment plan with the patient. The patient was provided an opportunity to ask questions and all were answered. The patient agreed with the plan and demonstrated an understanding of the instructions.   The patient was advised to call  back or seek an in-person evaluation if the symptoms worsen or if the condition fails to improve as anticipated.  I provided 10 minutes of non-face-to-face time during this encounter.   FONDA JONELLE MOWER, NP

## 2023-12-17 ENCOUNTER — Ambulatory Visit

## 2023-12-20 ENCOUNTER — Ambulatory Visit

## 2023-12-20 MED FILL — Fosaprepitant Dimeglumine For IV Infusion 150 MG (Base Eq): INTRAVENOUS | Qty: 5 | Status: AC

## 2023-12-21 ENCOUNTER — Other Ambulatory Visit: Payer: Self-pay

## 2023-12-21 ENCOUNTER — Other Ambulatory Visit: Payer: Self-pay | Admitting: Oncology

## 2023-12-21 ENCOUNTER — Inpatient Hospital Stay (HOSPITAL_BASED_OUTPATIENT_CLINIC_OR_DEPARTMENT_OTHER): Admitting: Nurse Practitioner

## 2023-12-21 ENCOUNTER — Telehealth: Payer: Self-pay

## 2023-12-21 ENCOUNTER — Inpatient Hospital Stay

## 2023-12-21 ENCOUNTER — Encounter: Payer: Self-pay | Admitting: Nurse Practitioner

## 2023-12-21 ENCOUNTER — Inpatient Hospital Stay: Admitting: Oncology

## 2023-12-21 ENCOUNTER — Ambulatory Visit
Admission: RE | Admit: 2023-12-21 | Discharge: 2023-12-21 | Disposition: A | Discharge status: Home / Self Care | Source: Ambulatory Visit | Attending: Radiation Oncology | Admitting: Radiation Oncology

## 2023-12-21 VITALS — BP 130/75 | HR 93 | Temp 98.1°F | Resp 17

## 2023-12-21 DIAGNOSIS — H1013 Acute atopic conjunctivitis, bilateral: Secondary | ICD-10-CM

## 2023-12-21 DIAGNOSIS — Z510 Encounter for antineoplastic radiation therapy: Secondary | ICD-10-CM | POA: Diagnosis not present

## 2023-12-21 DIAGNOSIS — C519 Malignant neoplasm of vulva, unspecified: Secondary | ICD-10-CM

## 2023-12-21 LAB — RAD ONC ARIA SESSION SUMMARY
Course Elapsed Days: 35
Plan Fractions Treated to Date: 17
Plan Prescribed Dose Per Fraction: 1.8 Gy
Plan Total Fractions Prescribed: 36
Plan Total Prescribed Dose: 64.8 Gy
Reference Point Dosage Given to Date: 30.6 Gy
Reference Point Session Dosage Given: 1.8 Gy
Session Number: 17

## 2023-12-21 LAB — CBC WITH DIFFERENTIAL (CANCER CENTER ONLY)
Abs Immature Granulocytes: 0.02 K/uL (ref 0.00–0.07)
Basophils Absolute: 0 K/uL (ref 0.0–0.1)
Basophils Relative: 0 %
Eosinophils Absolute: 0 K/uL (ref 0.0–0.5)
Eosinophils Relative: 2 %
HCT: 27.8 % — ABNORMAL LOW (ref 36.0–46.0)
Hemoglobin: 9.7 g/dL — ABNORMAL LOW (ref 12.0–15.0)
Immature Granulocytes: 1 %
Lymphocytes Relative: 47 %
Lymphs Abs: 1.1 K/uL (ref 0.7–4.0)
MCH: 36.7 pg — ABNORMAL HIGH (ref 26.0–34.0)
MCHC: 34.9 g/dL (ref 30.0–36.0)
MCV: 105.3 fL — ABNORMAL HIGH (ref 80.0–100.0)
Monocytes Absolute: 0.4 K/uL (ref 0.1–1.0)
Monocytes Relative: 19 %
Neutro Abs: 0.7 K/uL — ABNORMAL LOW (ref 1.7–7.7)
Neutrophils Relative %: 31 %
Platelet Count: 104 K/uL — ABNORMAL LOW (ref 150–400)
RBC: 2.64 MIL/uL — ABNORMAL LOW (ref 3.87–5.11)
RDW: 14.8 % (ref 11.5–15.5)
Smear Review: NORMAL
WBC Count: 2.3 K/uL — ABNORMAL LOW (ref 4.0–10.5)
nRBC: 0 % (ref 0.0–0.2)

## 2023-12-21 LAB — BASIC METABOLIC PANEL - CANCER CENTER ONLY
Anion gap: 7 (ref 5–15)
BUN: 10 mg/dL (ref 8–23)
CO2: 28 mmol/L (ref 22–32)
Calcium: 8.5 mg/dL — ABNORMAL LOW (ref 8.9–10.3)
Chloride: 98 mmol/L (ref 98–111)
Creatinine: 1.36 mg/dL — ABNORMAL HIGH (ref 0.44–1.00)
GFR, Estimated: 43 mL/min — ABNORMAL LOW
Glucose, Bld: 108 mg/dL — ABNORMAL HIGH (ref 70–99)
Potassium: 3.8 mmol/L (ref 3.5–5.1)
Sodium: 133 mmol/L — ABNORMAL LOW (ref 135–145)

## 2023-12-21 LAB — MAGNESIUM: Magnesium: 1.9 mg/dL (ref 1.7–2.4)

## 2023-12-21 MED ORDER — OLOPATADINE HCL 0.1 % OP SOLN: 1.0000 [drp] | Freq: Two times a day (BID) | OPHTHALMIC | 1 refills | Status: AC

## 2023-12-21 MED ORDER — SODIUM CHLORIDE 0.9% FLUSH
10.0000 mL | Freq: Once | INTRAVENOUS | Status: AC
  Administered 2023-12-21: 10 mL via INTRAVENOUS
  Filled 2023-12-21: qty 10

## 2023-12-21 MED ORDER — FILGRASTIM-SNDZ 480 MCG/0.8ML IJ SOSY
480.0000 ug | PREFILLED_SYRINGE | Freq: Every day | INTRAMUSCULAR | Status: DC
  Administered 2023-12-21: 480 ug via SUBCUTANEOUS
  Filled 2023-12-21: qty 0.8

## 2023-12-21 NOTE — Telephone Encounter (Signed)
 patient has right eye redness, blurry and watery says its been going on 2 weeks, MD recommendations [facility] NP notified

## 2023-12-21 NOTE — Progress Notes (Signed)
 Per Dr. Melanee ANC 0.7 no treatment today

## 2023-12-21 NOTE — Progress Notes (Signed)
 Nutrition Follow-up:  Patient with stage III SCC of vulva currently receiving concurrent chemoradiation.  Receiving cisplatin . Followed by Dr Melanee.  Met with patient in [facility] and sister.  No treatment today due to low WBC.  Reports that appetite continues to be good.  Eating fresh fruits and vegetables, and foods rich in protein.    Medications: reviewed  Labs: reviewed  Anthropometrics:   Weight 186 lb   185 lb on 9/2 182 lb on 8/18 198 lb on 10/16/22   NUTRITION DIAGNOSIS: Food and nutrition related knowledge deficit improved   INTERVENTION:  Continue well balanced diet including lean protein and plant foods Bowel regimen    MONITORING, EVALUATION, GOAL: weight trends, intake   NEXT VISIT: as needed  Patricia Silber B. Dasie SOLON, CSO, LDN Registered Dietitian [phone]

## 2023-12-21 NOTE — Addendum Note (Signed)
 Encounter addended by: Janice Lynwood BROCKS on: 12/21/2023 10:31 AM  Actions taken: Imaging Exam ended

## 2023-12-21 NOTE — Progress Notes (Signed)
 Symptom Management Clinic  [facility] A Department of the [place]. [facility] [address] [place], [place] [address] [phone] (phone) [phone] (fax)  Patient Care Team: Entzminger, Ethridge LABOR, MD as PCP - General (Internal Medicine) [name] Shasta POUR, RN as Oncology Nurse Navigator Maurie Rayfield BIRCH, RN as Oncology Nurse Navigator [name], MD as Consulting Physician (Oncology) Lenn Aran, MD as Consulting Physician (Radiation Oncology)   Name of the patient: Patricia Good  [id number]  15-Aug-1960   Date of visit: 12/21/23  Diagnosis- Vulvar Cancer  Chief complaint/ Reason for visit- Red, watery eyes  Heme/Onc history:  Oncology History  Malignant neoplasm of upper-inner quadrant of left breast in female, estrogen receptor positive (HCC)  03/10/2022 Initial Diagnosis   Malignant neoplasm of upper-inner quadrant of left breast in female, estrogen receptor positive (HCC)   03/10/2022 Cancer Staging   Staging form: Breast, AJCC 8th Edition - Clinical stage from 03/10/2022: Stage IA (cT1a, cN0, cM0, G2, ER+, PR+, HER2-) - Signed by [name], MD on 03/10/2022 Histologic grading system: 3 grade system   04/07/2022 Cancer Staging   Staging form: Breast, AJCC 8th Edition - Pathologic stage from 04/07/2022: Stage IA (pT1a, pN0, cM0, G3, ER+, PR+, HER2-, Oncotype DX score: 28) - Signed by [name], MD on 04/13/2022 Stage prefix: Initial diagnosis Multigene prognostic tests performed: Oncotype DX Recurrence score range: Greater than or equal to 11 Histologic grading system: 3 grade system   Vulvar cancer (HCC)  08/09/2023 Initial Diagnosis   Vulvar cancer (HCC)   10/11/2023 Cancer Staging   Staging form: Vulva, AJCC V9 - Clinical stage from 10/11/2023: FIGO Stage IIIB (cT2, cN1b, cM0) - Signed by [name], MD on 10/11/2023 Stage prefix: Initial diagnosis   11/15/2023 -  Chemotherapy   Patient is on  Treatment Plan : Vulvar carcinoma Cisplatin  (40) q7d + XRT       Interval history- Patricia MACIEJEWSKI is a 63 y.o. female who presents to symptom management clinic for complaints of itchy, watery, red eyes. Symptoms present x 2 weeks. Not worsening but not improving. She has tried otc hydrating drops without improvement. No discharge, pain, vision changes. Also congested. Fatigued from her chemotherapy.   Review of systems- Review of Systems  Constitutional:  Positive for malaise/fatigue. Negative for chills and fever.  HENT:  Positive for congestion. Negative for ear discharge, ear pain, sinus pain and sore throat.   Eyes:  Positive for redness. Negative for blurred vision, double vision, photophobia, pain and discharge.  Respiratory:  Negative for cough, sputum production and shortness of breath.   Skin:  Negative for itching and rash.  Neurological:  Negative for weakness and headaches.    Allergies  Allergen Reactions   Shellfish Allergy Anaphylaxis    Only to crab meat (non-imitation), not shrimp   Sulfasalazine Anaphylaxis and Hives    Other reaction(s): Other (See Comments)   Lisinopril     unknown    Past Medical History:  Diagnosis Date   Abnormal thyroid  stimulating hormone (TSH) level    Anemia    Anxiety    Asthma    Bipolar 1 disorder (HCC)    COPD (chronic obstructive pulmonary disease) (HCC)    Endometriosis    GERD (gastroesophageal reflux disease)    Hepatitis A    Hypertension    Hypothyroidism    Malignant neoplasm of upper-inner quadrant of left breast in female, estrogen receptor positive (HCC)  03/2022   Personal history of radiation therapy    Pre-diabetes    PTSD (post-traumatic stress disorder)    Schizoaffective disorder [facility])     Past Surgical History:  Procedure Laterality Date   BREAST BIOPSY Left 03/03/2022   Left breast stereo bx Ribbon Clip- path pending   BREAST BIOPSY Left 03/03/2022   MM LT BREAST BX W LOC DEV 1ST LESION IMAGE BX SPEC  STEREO GUIDE 03/03/2022 [facility]   BREAST LUMPECTOMY Left 03/20/2022   ECTOPIC PREGNANCY SURGERY     IR IMAGING GUIDED PORT INSERTION  11/11/2023   PART MASTECTOMY,RADIO FREQUENCY LOCALIZER,AXILLARY SENTINEL NODE BIOPSY Left 03/20/2022   Procedure: PART MASTECTOMY,RADIO FREQUENCY LOCALIZER,AXILLARY SENTINEL NODE BIOPSY;  Surgeon: Rodolph Romano, MD;  Location: [facility] ORS;  Service: General;  Laterality: Left;   TOTAL ABDOMINAL HYSTERECTOMY W/ BILATERAL SALPINGOOPHORECTOMY  1990    Social History   Socioeconomic History   Marital status: Single    Spouse name: Not on file   Number of children: 0   Years of education: Not on file   Highest education level: Associate degree: occupational, Scientist, product/process development, or vocational program  Occupational History   Not on file  Tobacco Use   Smoking status: Every Day    Current packs/day: 0.50    Average packs/day: 0.5 packs/day for 54.9 years (27.5 ttl pk-yrs)    Types: Cigarettes    Start date: 01/22/1969   Smokeless tobacco: Never   Tobacco comments:    Continues to go down on number per day to 5-7 per day.   Vaping Use   Vaping status: Never Used  Substance and Sexual Activity   Alcohol use: Not Currently    Alcohol/week: 1.0 - 2.0 standard drink of alcohol    Types: 1 Glasses of wine per week    Comment: occasional   Drug use: No   Sexual activity: Not Currently    Birth control/protection: None  Other Topics Concern   Not on file  Social History Narrative   Lives alone   Social Drivers of Health   Financial Resource Strain: Medium Risk (08/03/2023)   Received from [facility]   Overall Financial Resource Strain (CARDIA)    Difficulty of Paying Living Expenses: Somewhat hard  Food Insecurity: Food Insecurity Present (11/15/2023)   Hunger Vital Sign    Worried About Running Out of Food in the Last Year: Sometimes true    Ran Out of Food in the Last Year: Sometimes true  Transportation Needs: No  Transportation Needs (08/19/2023)   PRAPARE - Administrator, Civil Service (Medical): No    Lack of Transportation (Non-Medical): No  Physical Activity: Inactive (06/15/2017)   Exercise Vital Sign    Days of Exercise per Week: 0 days    Minutes of Exercise per Session: 0 min  Stress: Stress Concern Present (06/15/2017)   [employer] of Occupational Health - Occupational Stress Questionnaire    Feeling of Stress : Very much  Social Connections: Socially Isolated (06/15/2017)   Social Connection and Isolation Panel    Frequency of Communication with Friends and Family: Never    Frequency of Social Gatherings with Friends and Family: Never    Attends Religious Services: Never    Database administrator or Organizations: No    Attends Banker Meetings: Never    Marital Status: Divorced  Catering manager Violence: Not At Risk (08/19/2023)   Humiliation, Afraid, Rape, and Kick questionnaire    Fear of Current or  Ex-Partner: No    Emotionally Abused: No    Physically Abused: No    Sexually Abused: No    Family History  Problem Relation Age of Onset   Hypertension Father    Diabetes Father    Heart disease Father    Blindness Father    Alcohol abuse Sister    Drug abuse Sister    Anxiety disorder Sister    Depression Sister    Obesity Brother    Arthritis Brother    Alcohol abuse Brother    Drug abuse Brother    Depression Brother    Breast cancer Cousin    Clotting disorder Neg Hx      Current Outpatient Medications:    albuterol  (VENTOLIN  HFA) 108 (90 Base) MCG/ACT inhaler, 2 puffs Inhalation every 4-6 hrs for 30 days for shortness of breath or wheezing, Disp: , Rfl:    alendronate (FOSAMAX) 70 MG tablet, Take 70 mg by mouth once a week., Disp: , Rfl:    amLODipine  (NORVASC ) 10 MG tablet, 1 tablet Orally Once a day for 100 days for high blood pressure, Disp: , Rfl:    atorvastatin (LIPITOR) 20 MG tablet, Take 20 mg by mouth at bedtime., Disp: ,  Rfl:    buPROPion  (WELLBUTRIN  XL) 150 MG 24 hr tablet, TAKE 1 TABLET BY MOUTH DAILY ALONG WITH 300 MG TABLET FOR TOTAL OF 450 MG DAILY, Disp: 90 tablet, Rfl: 1   buPROPion  (WELLBUTRIN  XL) 300 MG 24 hr tablet, Take 1 tablet (300 mg total) by mouth daily. Take along with 150 mg daily , total of 450 mg daily, Disp: 90 tablet, Rfl: 1   Calcium  Carb-Cholecalciferol 500-10 MG-MCG TABS, Take 1 tablet by mouth., Disp: , Rfl:    calcium -vitamin D  (OSCAL WITH D) 500-200 MG-UNIT tablet, Take 1 tablet by mouth daily with breakfast., Disp: , Rfl:    clonazePAM  (KLONOPIN ) 0.5 MG tablet, Take 1 tablet (0.5 mg total) by mouth daily as needed for anxiety. Please use it only for severe panic attacks, do not combine with Oxycodone , Disp: 15 tablet, Rfl: 0   dexamethasone  (DECADRON ) 4 MG tablet, Take 2 tablets daily x 3 days starting the day after chemotherapy. Take with food., Disp: 30 tablet, Rfl: 1   fluticasone  (FLONASE ) 50 MCG/ACT nasal spray, Place into both nostrils as needed for allergies or rhinitis. (Patient not taking: Reported on 12/08/2023), Disp: , Rfl:    furosemide  (LASIX ) 20 MG tablet, 1 tablet Orally Once a day for 100 days high blood pressure, Disp: , Rfl:    hydrOXYzine  (ATARAX ) 10 MG tablet, Take 2 tablets (20 mg total) by mouth at bedtime as needed. for sleep and anxiety, Disp: 180 tablet, Rfl: 0   Lactulose 20 GM/30ML SOLN, 15 mL Orally twice a day for 30 days as needed for constipation, Disp: , Rfl:    letrozole  (FEMARA ) 2.5 MG tablet, TAKE 1 TABLET BY MOUTH DAILY, Disp: 90 tablet, Rfl: 3   levocetirizine (XYZAL) 5 MG tablet, 1 tablet in the evening Orally Once a day for 100 days as needed for allergy symptoms, Disp: , Rfl:    levothyroxine  (SYNTHROID , LEVOTHROID) 112 MCG tablet, , Disp: , Rfl:    lidocaine -prilocaine  (EMLA ) cream, Apply 1 Application topically as needed. Apply a small amount to port site 1 hour before chemotherapy, Disp: 30 g, Rfl: 2   OLANZapine  (ZYPREXA ) 5 MG tablet, Take 1  tablet (5 mg total) by mouth at bedtime., Disp: 90 tablet, Rfl: 3   omeprazole (PRILOSEC)  40 MG capsule, Take 40 mg by mouth daily. , Disp: , Rfl:    ondansetron  (ZOFRAN ) 8 MG tablet, Take 1 tablet (8 mg total) by mouth every 8hrs as needed for nausea or vomiting. Start on the third day after cisplatin ., Disp: 30 tablet, Rfl: 1   oxyCODONE  (OXY IR/ROXICODONE ) 5 MG immediate release tablet, Take 0.5-1 tablets (2.5-5 mg total) by mouth every 8 (eight) hours as needed for severe pain (pain score 7-10) (for pain unrelieved by tylenol )., Disp: 30 tablet, Rfl: 0   potassium chloride  SA (KLOR-CON  M) 20 MEQ tablet, Take 1 tablet (20 mEq total) by mouth daily. For low potassium, Disp: 30 tablet, Rfl: 0   prazosin  (MINIPRESS ) 2 MG capsule, TAKE ONE CAPSULE BY MOUTH AT BEDTIME., Disp: 90 capsule, Rfl: 1   prochlorperazine  (COMPAZINE ) 10 MG tablet, Take 1 tablet (10 mg total) by mouth every 6 (six) hours as needed for nausea or vomiting., Disp: 30 tablet, Rfl: 1   tiotropium (SPIRIVA ) 18 MCG inhalation capsule, Place 18 mcg into inhaler and inhale daily., Disp: , Rfl:    traZODone  (DESYREL ) 50 MG tablet, Take 1 tablet (50 mg total) by mouth at bedtime., Disp: 30 tablet, Rfl: 0   varenicline  (CHANTIX ) 0.5 MG tablet, Take 1 tablet (0.5 mg total) by mouth 2 (two) times daily., Disp: 60 tablet, Rfl: 1  Physical exam:  Vitals:   12/21/23 0900  BP: 130/75  Pulse: 93  Resp: 17  Temp: 98.1 F (36.7 C)  TempSrc: Tympanic  SpO2: 100%   Physical Exam Vitals reviewed.  Constitutional:      Appearance: She is not ill-appearing.  HENT:     Nose: Rhinorrhea present. No congestion.     Mouth/Throat:     Mouth: Mucous membranes are moist.     Pharynx: Oropharynx is clear. No oropharyngeal exudate or posterior oropharyngeal erythema.  Eyes:     General: Allergic shiner present. No scleral icterus.    Conjunctiva/sclera:     Right eye: Right conjunctiva is injected. No chemosis, exudate or hemorrhage.    Left  eye: Left conjunctiva is injected. No chemosis, exudate or hemorrhage. Neurological:     Mental Status: She is alert.     Assessment and plan- Patient is a 63 y.o. female on chemo-radiation for vulvar cancer who presents to symptom management clinic for complaints of:  Allergic conjunctivitis- trial ophthalmic olopatadine , 1 drop, both eyes, twice a day. Can take oral antihistamines as well. Wash eyes twice a day with baby shampoo to help remove allergens. If crusting, discharge, worsening symptoms, notify clinic for re-evaluation.    Visit Diagnosis 1. Allergic conjunctivitis of both eyes    Patient expressed understanding and was in agreement with this plan. She also understands that She can call clinic at any time with any questions, concerns, or complaints.   Thank you for allowing me to participate in the care of this very pleasant patient.   Tinnie Dawn, DNP, AGNP-C, AOCNP [facility] [phone]

## 2023-12-22 ENCOUNTER — Ambulatory Visit
Admission: RE | Admit: 2023-12-22 | Discharge: 2023-12-22 | Discharge status: Home / Self Care | Source: Ambulatory Visit | Attending: Radiation Oncology | Admitting: Radiation Oncology

## 2023-12-22 ENCOUNTER — Inpatient Hospital Stay

## 2023-12-22 ENCOUNTER — Encounter: Payer: Self-pay | Admitting: Oncology

## 2023-12-22 ENCOUNTER — Other Ambulatory Visit: Payer: Self-pay

## 2023-12-22 DIAGNOSIS — C519 Malignant neoplasm of vulva, unspecified: Secondary | ICD-10-CM

## 2023-12-22 DIAGNOSIS — Z510 Encounter for antineoplastic radiation therapy: Secondary | ICD-10-CM | POA: Diagnosis not present

## 2023-12-22 LAB — RAD ONC ARIA SESSION SUMMARY
Course Elapsed Days: 36
Plan Fractions Treated to Date: 18
Plan Prescribed Dose Per Fraction: 1.8 Gy
Plan Total Fractions Prescribed: 36
Plan Total Prescribed Dose: 64.8 Gy
Reference Point Dosage Given to Date: 32.4 Gy
Reference Point Session Dosage Given: 1.8 Gy
Session Number: 18

## 2023-12-22 MED ORDER — FILGRASTIM-SNDZ 480 MCG/0.8ML IJ SOSY
480.0000 ug | PREFILLED_SYRINGE | Freq: Every day | INTRAMUSCULAR | Status: DC
  Administered 2023-12-22: 480 ug via SUBCUTANEOUS
  Filled 2023-12-22: qty 0.8

## 2023-12-23 ENCOUNTER — Other Ambulatory Visit: Payer: Self-pay

## 2023-12-23 ENCOUNTER — Ambulatory Visit

## 2023-12-23 ENCOUNTER — Ambulatory Visit: Admitting: Psychiatry

## 2023-12-23 ENCOUNTER — Ambulatory Visit
Admission: RE | Admit: 2023-12-23 | Discharge: 2023-12-23 | Discharge status: Home / Self Care | Source: Ambulatory Visit | Attending: Radiation Oncology | Admitting: Radiation Oncology

## 2023-12-23 ENCOUNTER — Inpatient Hospital Stay

## 2023-12-23 VITALS — BP 125/82 | HR 93

## 2023-12-23 DIAGNOSIS — Z510 Encounter for antineoplastic radiation therapy: Secondary | ICD-10-CM | POA: Diagnosis not present

## 2023-12-23 DIAGNOSIS — C519 Malignant neoplasm of vulva, unspecified: Secondary | ICD-10-CM

## 2023-12-23 LAB — RAD ONC ARIA SESSION SUMMARY
Course Elapsed Days: 37
Plan Fractions Treated to Date: 19
Plan Prescribed Dose Per Fraction: 1.8 Gy
Plan Total Fractions Prescribed: 36
Plan Total Prescribed Dose: 64.8 Gy
Reference Point Dosage Given to Date: 34.2 Gy
Reference Point Session Dosage Given: 1.8 Gy
Session Number: 19

## 2023-12-23 MED ORDER — FILGRASTIM-SNDZ 480 MCG/0.8ML IJ SOSY
480.0000 ug | PREFILLED_SYRINGE | Freq: Every day | INTRAMUSCULAR | Status: DC
  Administered 2023-12-23: 480 ug via SUBCUTANEOUS
  Filled 2023-12-23: qty 0.8

## 2023-12-23 NOTE — Progress Notes (Signed)
 Patient reports feeling lethargic since staring Zarxio  injections.  Discussed with Lauren A, NP prior to proceeding with inj today.  Informed pt that Lauren approves to proceed with inj today and lethargy more likely due to radiation treatments

## 2023-12-24 ENCOUNTER — Ambulatory Visit
Admission: RE | Admit: 2023-12-24 | Discharge: 2023-12-24 | Disposition: A | Discharge status: Home / Self Care | Source: Ambulatory Visit | Attending: Radiation Oncology | Admitting: Radiation Oncology

## 2023-12-24 ENCOUNTER — Ambulatory Visit

## 2023-12-24 ENCOUNTER — Other Ambulatory Visit: Payer: Self-pay

## 2023-12-24 DIAGNOSIS — Z510 Encounter for antineoplastic radiation therapy: Secondary | ICD-10-CM | POA: Diagnosis not present

## 2023-12-24 LAB — RAD ONC ARIA SESSION SUMMARY
Course Elapsed Days: 38
Plan Fractions Treated to Date: 20
Plan Prescribed Dose Per Fraction: 1.8 Gy
Plan Total Fractions Prescribed: 36
Plan Total Prescribed Dose: 64.8 Gy
Reference Point Dosage Given to Date: 36 Gy
Reference Point Session Dosage Given: 1.8 Gy
Session Number: 20

## 2023-12-27 ENCOUNTER — Ambulatory Visit
Admission: RE | Admit: 2023-12-27 | Discharge: 2023-12-27 | Disposition: A | Discharge status: Home / Self Care | Source: Ambulatory Visit | Attending: Radiation Oncology | Admitting: Radiation Oncology

## 2023-12-27 ENCOUNTER — Telehealth: Payer: Self-pay | Admitting: Oncology

## 2023-12-27 ENCOUNTER — Ambulatory Visit

## 2023-12-27 ENCOUNTER — Telehealth: Payer: Self-pay | Admitting: Radiation Oncology

## 2023-12-27 ENCOUNTER — Other Ambulatory Visit: Payer: Self-pay

## 2023-12-27 DIAGNOSIS — Z510 Encounter for antineoplastic radiation therapy: Secondary | ICD-10-CM | POA: Diagnosis not present

## 2023-12-27 LAB — RAD ONC ARIA SESSION SUMMARY
Course Elapsed Days: 41
Plan Fractions Treated to Date: 21
Plan Prescribed Dose Per Fraction: 1.8 Gy
Plan Total Fractions Prescribed: 36
Plan Total Prescribed Dose: 64.8 Gy
Reference Point Dosage Given to Date: 37.8 Gy
Reference Point Session Dosage Given: 0.7034 Gy
Session Number: 21

## 2023-12-27 MED FILL — Fosaprepitant Dimeglumine For IV Infusion 150 MG (Base Eq): INTRAVENOUS | Qty: 5 | Status: AC

## 2023-12-27 NOTE — Telephone Encounter (Signed)
 Berlynn called and got our answering service. She wants to cancel her 930 am radiation appt. Radiation was notified.

## 2023-12-27 NOTE — Telephone Encounter (Signed)
 Pt called and wants to speak with Dickey in regards to a check for her landlord. Pt stated she is moving out. Please call pt back at [phone]. Pt stated this is her only phone number now.

## 2023-12-28 ENCOUNTER — Inpatient Hospital Stay

## 2023-12-28 ENCOUNTER — Ambulatory Visit
Admission: RE | Admit: 2023-12-28 | Discharge: 2023-12-28 | Disposition: A | Discharge status: Home / Self Care | Source: Ambulatory Visit | Attending: Radiation Oncology | Admitting: Radiation Oncology

## 2023-12-28 ENCOUNTER — Inpatient Hospital Stay (HOSPITAL_BASED_OUTPATIENT_CLINIC_OR_DEPARTMENT_OTHER): Admitting: Oncology

## 2023-12-28 ENCOUNTER — Telehealth: Payer: Self-pay | Admitting: Pharmacy Technician

## 2023-12-28 ENCOUNTER — Other Ambulatory Visit: Payer: Self-pay

## 2023-12-28 ENCOUNTER — Encounter: Payer: Self-pay | Admitting: Oncology

## 2023-12-28 VITALS — BP 128/60 | HR 87

## 2023-12-28 VITALS — BP 134/77 | HR 96 | Temp 97.1°F | Resp 17 | Ht 61.0 in | Wt 185.4 lb

## 2023-12-28 DIAGNOSIS — D6959 Other secondary thrombocytopenia: Secondary | ICD-10-CM

## 2023-12-28 DIAGNOSIS — D701 Agranulocytosis secondary to cancer chemotherapy: Secondary | ICD-10-CM | POA: Diagnosis not present

## 2023-12-28 DIAGNOSIS — C519 Malignant neoplasm of vulva, unspecified: Secondary | ICD-10-CM | POA: Diagnosis not present

## 2023-12-28 DIAGNOSIS — T451X5A Adverse effect of antineoplastic and immunosuppressive drugs, initial encounter: Secondary | ICD-10-CM

## 2023-12-28 DIAGNOSIS — Z5111 Encounter for antineoplastic chemotherapy: Secondary | ICD-10-CM

## 2023-12-28 DIAGNOSIS — Z510 Encounter for antineoplastic radiation therapy: Secondary | ICD-10-CM | POA: Diagnosis not present

## 2023-12-28 DIAGNOSIS — E871 Hypo-osmolality and hyponatremia: Secondary | ICD-10-CM

## 2023-12-28 LAB — CBC WITH DIFFERENTIAL (CANCER CENTER ONLY)
Abs Immature Granulocytes: 0.38 K/uL — ABNORMAL HIGH (ref 0.00–0.07)
Basophils Absolute: 0.1 K/uL (ref 0.0–0.1)
Basophils Relative: 1 %
Eosinophils Absolute: 0.1 K/uL (ref 0.0–0.5)
Eosinophils Relative: 1 %
HCT: 28.1 % — ABNORMAL LOW (ref 36.0–46.0)
Hemoglobin: 10.1 g/dL — ABNORMAL LOW (ref 12.0–15.0)
Immature Granulocytes: 8 %
Lymphocytes Relative: 24 %
Lymphs Abs: 1.2 K/uL (ref 0.7–4.0)
MCH: 37.3 pg — ABNORMAL HIGH (ref 26.0–34.0)
MCHC: 35.9 g/dL (ref 30.0–36.0)
MCV: 103.7 fL — ABNORMAL HIGH (ref 80.0–100.0)
Monocytes Absolute: 1 K/uL (ref 0.1–1.0)
Monocytes Relative: 21 %
Neutro Abs: 2.2 K/uL (ref 1.7–7.7)
Neutrophils Relative %: 45 %
Platelet Count: 116 K/uL — ABNORMAL LOW (ref 150–400)
RBC: 2.71 MIL/uL — ABNORMAL LOW (ref 3.87–5.11)
RDW: 15.9 % — ABNORMAL HIGH (ref 11.5–15.5)
WBC Count: 4.9 K/uL (ref 4.0–10.5)
nRBC: 0 % (ref 0.0–0.2)

## 2023-12-28 LAB — BASIC METABOLIC PANEL - CANCER CENTER ONLY
Anion gap: 9 (ref 5–15)
BUN: 10 mg/dL (ref 8–23)
CO2: 27 mmol/L (ref 22–32)
Calcium: 8.5 mg/dL — ABNORMAL LOW (ref 8.9–10.3)
Chloride: 92 mmol/L — ABNORMAL LOW (ref 98–111)
Creatinine: 1.03 mg/dL — ABNORMAL HIGH (ref 0.44–1.00)
GFR, Estimated: >60 mL/min (ref >60)
Glucose, Bld: 119 mg/dL — ABNORMAL HIGH (ref 70–99)
Potassium: 3.7 mmol/L (ref 3.5–5.1)
Sodium: 128 mmol/L — ABNORMAL LOW (ref 135–145)

## 2023-12-28 LAB — RAD ONC ARIA SESSION SUMMARY
Course Elapsed Days: 42
Plan Fractions Treated to Date: 22
Plan Prescribed Dose Per Fraction: 1.8 Gy
Plan Total Fractions Prescribed: 36
Plan Total Prescribed Dose: 64.8 Gy
Reference Point Dosage Given to Date: 39.6 Gy
Reference Point Session Dosage Given: 1.8 Gy
Session Number: 22

## 2023-12-28 LAB — MAGNESIUM: Magnesium: 2 mg/dL (ref 1.7–2.4)

## 2023-12-28 MED ORDER — SODIUM CHLORIDE 0.9 % IV SOLN
30.0000 mg/m2 | Freq: Once | INTRAVENOUS | Status: AC
  Administered 2023-12-28: 57 mg via INTRAVENOUS
  Filled 2023-12-28: qty 57

## 2023-12-28 MED ORDER — POTASSIUM CHLORIDE IN NACL 20-0.9 MEQ/L-% IV SOLN
Freq: Once | INTRAVENOUS | Status: AC
  Filled 2023-12-28: qty 1000

## 2023-12-28 MED ORDER — PALONOSETRON HCL INJECTION 0.25 MG/5ML
0.2500 mg | Freq: Once | INTRAVENOUS | Status: AC
  Administered 2023-12-28: 0.25 mg via INTRAVENOUS
  Filled 2023-12-28: qty 5

## 2023-12-28 MED ORDER — DEXAMETHASONE SODIUM PHOSPHATE 10 MG/ML IJ SOLN
10.0000 mg | Freq: Once | INTRAMUSCULAR | Status: AC
  Administered 2023-12-28: 10 mg via INTRAVENOUS
  Filled 2023-12-28: qty 1

## 2023-12-28 MED ORDER — MAGNESIUM SULFATE 2 GM/50ML IV SOLN
2.0000 g | Freq: Once | INTRAVENOUS | Status: AC
  Administered 2023-12-28: 2 g via INTRAVENOUS
  Filled 2023-12-28: qty 50

## 2023-12-28 MED ORDER — SODIUM CHLORIDE 0.9 % IV SOLN
INTRAVENOUS | Status: DC
  Filled 2023-12-28: qty 250

## 2023-12-28 MED ORDER — SODIUM CHLORIDE 0.9 % IV SOLN
150.0000 mg | Freq: Once | INTRAVENOUS | Status: AC
  Administered 2023-12-28: 150 mg via INTRAVENOUS
  Filled 2023-12-28: qty 5

## 2023-12-28 NOTE — Progress Notes (Signed)
 Hematology/Oncology Consult note [facility]  Telephone:([phone] [phone]  Patient Care Team: Entzminger, Ethridge LABOR, MD as PCP - General (Internal Medicine) [name] Shasta POUR, RN as Oncology Nurse Navigator Maurie Rayfield BIRCH, RN as Oncology Nurse Navigator [name], MD as Consulting Physician (Oncology) Lenn Aran, MD as Consulting Physician (Radiation Oncology)   Name of the patient: Patricia Good  [id number]  24-Jul-1960   Date of visit: 12/28/23  Diagnosis- 1.  History of breast cancer in remission 2.  Locally advanced vulvar carcinoma    Chief complaint/ Reason for visit-on treatment assessment prior to cycle 5 of weekly cisplatin  chemotherapy  Heme/Onc history: Patient is a 63 year old female with history of stage I left breast cancer ER/PR positive HER2 negative.  She underwent lumpectomy and sentinel lymph node biopsy.  She declined adjuvant chemotherapy despite her recurrence score of 28.  She also stopped radiation therapy prematurely.She is presently on letrozole  for endocrine therapy.  She has baseline osteoporosis for which she is on Fosamax.   She was seen by infectious disease for herpetic like lesions but overall lesions were concerning for malignancy and patient was seen by Dr. Lovetta April 2025.  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    PET CT scan on 09/10/2023 showed: 1. Superficial right vulvar lesion measuring up to 3.3 cm in length with maximum SUV 13.6, compatible with malignancy. 2. Small amount of accentuated activity along the right upper medial thigh just below the crease with the pubis, with associated soft tissue density measuring about 0.7 by 1.7 cm, with maximum SUV  7.7, concerning for malignancy. 3. Right inguinal and right external iliac lymph nodes with maximum above blood pool, suspicious for metastatic disease.   Plan is for weekly cisplatin  for concurrent chemoradiation    Interval history-she still has some symptoms of bilateral eye redness and occasional tearing which is gradually improving.  She has not received chemotherapy for 3 weeks now due to cytopenias  ECOG PS- 1 Pain scale- 0  Review of systems- Review of Systems  Constitutional:  Negative for chills, fever, malaise/fatigue and weight loss.  HENT:  Negative for congestion, ear discharge and nosebleeds.   Eyes:  Negative for blurred vision.  Respiratory:  Negative for cough, hemoptysis, sputum production, shortness of breath and wheezing.   Cardiovascular:  Negative for chest pain, palpitations, orthopnea and claudication.  Gastrointestinal:  Negative for abdominal pain, blood in stool, constipation, diarrhea, heartburn, melena, nausea and vomiting.  Genitourinary:  Negative for dysuria, flank pain, frequency, hematuria and urgency.  Musculoskeletal:  Negative for back pain, joint pain and myalgias.  Skin:  Negative for rash.  Neurological:  Negative for dizziness, tingling, focal weakness, seizures, weakness and headaches.  Endo/Heme/Allergies:  Does not bruise/bleed easily.  Psychiatric/Behavioral:  Negative for depression and suicidal ideas. The patient does not have insomnia.       Allergies  Allergen Reactions   Shellfish Allergy Anaphylaxis    Only to crab meat (non-imitation), not shrimp   Sulfasalazine Anaphylaxis and Hives    Other reaction(s): Other (See Comments)   Lisinopril     unknown     Past Medical History:  Diagnosis Date   Abnormal thyroid  stimulating hormone (TSH) level    Anemia    Anxiety    Asthma    Bipolar 1  disorder (HCC)    COPD (chronic obstructive pulmonary disease) (HCC)    Endometriosis    GERD (gastroesophageal reflux disease)     Hepatitis A    Hypertension    Hypothyroidism    Malignant neoplasm of upper-inner quadrant of left breast in female, 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   Personal history of radiation therapy    Pre-diabetes    PTSD (post-traumatic stress disorder)    Schizoaffective disorder [facility])      Past Surgical History:  Procedure Laterality Date   BREAST BIOPSY Left 03/03/2022   Left breast stereo bx Ribbon Clip- path pending   BREAST BIOPSY Left 03/03/2022   MM LT BREAST BX W LOC DEV 1ST LESION IMAGE BX SPEC STEREO GUIDE 03/03/2022 [facility]   BREAST LUMPECTOMY Left 03/20/2022   ECTOPIC PREGNANCY SURGERY     IR IMAGING GUIDED PORT INSERTION  11/11/2023   PART MASTECTOMY,RADIO FREQUENCY LOCALIZER,AXILLARY SENTINEL NODE BIOPSY Left 03/20/2022   Procedure: PART MASTECTOMY,RADIO FREQUENCY LOCALIZER,AXILLARY SENTINEL NODE BIOPSY;  Surgeon: Rodolph Romano, MD;  Location: [facility]S;  Service: General;  Laterality: Left;   TOTAL ABDOMINAL HYSTERECTOMY W/ BILATERAL SALPINGOOPHORECTOMY  1990    Social History   Socioeconomic History   Marital status: Single    Spouse name: Not on file   Number of children: 0   Years of education: Not on file   Highest education level: Associate degree: occupational, Scientist, product/process development, or vocational program  Occupational History   Not on file  Tobacco Use   Smoking status: Every Day    Current packs/day: 0.50    Average packs/day: 0.5 packs/day for 54.9 years (27.5 ttl pk-yrs)    Types: Cigarettes    Start date: 01/22/1969   Smokeless tobacco: Never   Tobacco comments:    Continues to go down on number per day to 5-7 per day.   Vaping Use   Vaping status: Never Used  Substance and Sexual Activity   Alcohol use: Not Currently    Alcohol/week: 1.0 - 2.0 standard drink of alcohol    Types: 1 Glasses of wine per week    Comment: occasional   Drug use: No   Sexual activity: Not Currently    Birth control/protection: None  Other Topics Concern    Not on file  Social History Narrative   Lives alone   Social Drivers of Health   Financial Resource Strain: Medium Risk (08/03/2023)   Received from [facility]   Overall Financial Resource Strain (CARDIA)    Difficulty of Paying Living Expenses: Somewhat hard  Food Insecurity: Food Insecurity Present (11/15/2023)   Hunger Vital Sign    Worried About Running Out of Food in the Last Year: Sometimes true    Ran Out of Food in the Last Year: Sometimes true  Transportation Needs: No Transportation Needs (08/19/2023)   PRAPARE - Administrator, Civil Service (Medical): No    Lack of Transportation (Non-Medical): No  Physical Activity: Inactive (06/15/2017)   Exercise Vital Sign    Days of Exercise per Week: 0 days    Minutes of Exercise per Session: 0 min  Stress: Stress Concern Present (06/15/2017)   [employer] of Occupational Health - Occupational Stress Questionnaire    Feeling of Stress : Very much  Social Connections: Socially Isolated (06/15/2017)   Social Connection and Isolation Panel    Frequency of Communication with Friends and Family: Never    Frequency of Social Gatherings with Friends and Family: Never  Attends Religious Services: Never    Active Member of Clubs or Organizations: No    Attends Banker Meetings: Never    Marital Status: Divorced  Catering manager Violence: Not At Risk (08/19/2023)   Humiliation, Afraid, Rape, and Kick questionnaire    Fear of Current or Ex-Partner: No    Emotionally Abused: No    Physically Abused: No    Sexually Abused: No    Family History  Problem Relation Age of Onset   Hypertension Father    Diabetes Father    Heart disease Father    Blindness Father    Alcohol abuse Sister    Drug abuse Sister    Anxiety disorder Sister    Depression Sister    Obesity Brother    Arthritis Brother    Alcohol abuse Brother    Drug abuse Brother    Depression Brother    Breast cancer  Cousin    Clotting disorder Neg Hx      Current Outpatient Medications:    albuterol  (VENTOLIN  HFA) 108 (90 Base) MCG/ACT inhaler, 2 puffs Inhalation every 4-6 hrs for 30 days for shortness of breath or wheezing, Disp: , Rfl:    alendronate (FOSAMAX) 70 MG tablet, Take 70 mg by mouth once a week., Disp: , Rfl:    amLODipine  (NORVASC ) 10 MG tablet, 1 tablet Orally Once a day for 100 days for high blood pressure, Disp: , Rfl:    atorvastatin (LIPITOR) 20 MG tablet, Take 20 mg by mouth at bedtime., Disp: , Rfl:    buPROPion  (WELLBUTRIN  XL) 150 MG 24 hr tablet, TAKE 1 TABLET BY MOUTH DAILY ALONG WITH 300 MG TABLET FOR TOTAL OF 450 MG DAILY, Disp: 90 tablet, Rfl: 1   buPROPion  (WELLBUTRIN  XL) 300 MG 24 hr tablet, Take 1 tablet (300 mg total) by mouth daily. Take along with 150 mg daily , total of 450 mg daily, Disp: 90 tablet, Rfl: 1   Calcium  Carb-Cholecalciferol 500-10 MG-MCG TABS, Take 1 tablet by mouth., Disp: , Rfl:    calcium -vitamin D  (OSCAL WITH D) 500-200 MG-UNIT tablet, Take 1 tablet by mouth daily with breakfast., Disp: , Rfl:    clonazePAM  (KLONOPIN ) 0.5 MG tablet, Take 1 tablet (0.5 mg total) by mouth daily as needed for anxiety. Please use it only for severe panic attacks, do not combine with Oxycodone , Disp: 15 tablet, Rfl: 0   dexamethasone  (DECADRON ) 4 MG tablet, Take 2 tablets daily x 3 days starting the day after chemotherapy. Take with food., Disp: 30 tablet, Rfl: 1   fluticasone  (FLONASE ) 50 MCG/ACT nasal spray, Place into both nostrils as needed for allergies or rhinitis., Disp: , Rfl:    furosemide  (LASIX ) 20 MG tablet, 1 tablet Orally Once a day for 100 days high blood pressure, Disp: , Rfl:    hydrOXYzine  (ATARAX ) 10 MG tablet, Take 2 tablets (20 mg total) by mouth at bedtime as needed. for sleep and anxiety, Disp: 180 tablet, Rfl: 0   Lactulose 20 GM/30ML SOLN, 15 mL Orally twice a day for 30 days as needed for constipation, Disp: , Rfl:    letrozole  (FEMARA ) 2.5 MG tablet,  TAKE 1 TABLET BY MOUTH DAILY, Disp: 90 tablet, Rfl: 3   levocetirizine (XYZAL) 5 MG tablet, 1 tablet in the evening Orally Once a day for 100 days as needed for allergy symptoms, Disp: , Rfl:    levothyroxine  (SYNTHROID , LEVOTHROID) 112 MCG tablet, , Disp: , Rfl:    lidocaine -prilocaine  (EMLA ) cream, Apply 1  Application topically as needed. Apply a small amount to port site 1 hour before chemotherapy, Disp: 30 g, Rfl: 2   OLANZapine  (ZYPREXA ) 5 MG tablet, Take 1 tablet (5 mg total) by mouth at bedtime., Disp: 90 tablet, Rfl: 3   olopatadine  (PATANOL) 0.1 % ophthalmic solution, Place 1 drop into both eyes 2 (two) times daily., Disp: 5 mL, Rfl: 1   omeprazole (PRILOSEC) 40 MG capsule, Take 40 mg by mouth daily. , Disp: , Rfl:    ondansetron  (ZOFRAN ) 8 MG tablet, Take 1 tablet (8 mg total) by mouth every 8hrs as needed for nausea or vomiting. Start on the third day after cisplatin ., Disp: 30 tablet, Rfl: 1   oxyCODONE  (OXY IR/ROXICODONE ) 5 MG immediate release tablet, Take 0.5-1 tablets (2.5-5 mg total) by mouth every 8 (eight) hours as needed for severe pain (pain score 7-10) (for pain unrelieved by tylenol )., Disp: 30 tablet, Rfl: 0   potassium chloride  SA (KLOR-CON  M) 20 MEQ tablet, Take 1 tablet (20 mEq total) by mouth daily. For low potassium, Disp: 30 tablet, Rfl: 0   prazosin  (MINIPRESS ) 2 MG capsule, TAKE ONE CAPSULE BY MOUTH AT BEDTIME., Disp: 90 capsule, Rfl: 1   prochlorperazine  (COMPAZINE ) 10 MG tablet, Take 1 tablet (10 mg total) by mouth every 6 (six) hours as needed for nausea or vomiting., Disp: 30 tablet, Rfl: 1   tiotropium (SPIRIVA ) 18 MCG inhalation capsule, Place 18 mcg into inhaler and inhale daily., Disp: , Rfl:    traZODone  (DESYREL ) 50 MG tablet, TAKE ONE TABLET BY MOUTH AT BEDTIME, Disp: 30 tablet, Rfl: 0   varenicline  (CHANTIX ) 0.5 MG tablet, Take 1 tablet (0.5 mg total) by mouth 2 (two) times daily., Disp: 60 tablet, Rfl: 1  Physical exam:  Vitals:   12/28/23 0853  BP:  134/77  Pulse: 96  Resp: 17  Temp: (!) 97.1 F (36.2 C)  TempSrc: Tympanic  SpO2: 100%  Weight: 185 lb 6.4 oz (84.1 kg)  Height: 5' 1 (1.549 m)   Physical Exam Cardiovascular:     Rate and Rhythm: Normal rate and regular rhythm.     Heart sounds: Normal heart sounds.  Pulmonary:     Effort: Pulmonary effort is normal.     Breath sounds: Normal breath sounds.  Abdominal:     General: Bowel sounds are normal.     Palpations: Abdomen is soft.  Skin:    General: Skin is warm and dry.  Neurological:     Mental Status: She is alert and oriented to person, place, and time.      I have personally reviewed labs listed below:    Latest Ref Rng & Units 12/28/2023    8:34 AM  CMP  Glucose 70 - 99 mg/dL 880   BUN 8 - 23 mg/dL 10   Creatinine 9.55 - 1.00 mg/dL 8.96   Sodium 864 - 854 mmol/L 128   Potassium 3.5 - 5.1 mmol/L 3.7   Chloride 98 - 111 mmol/L 92   CO2 22 - 32 mmol/L 27   Calcium  8.9 - 10.3 mg/dL 8.5       Latest Ref Rng & Units 12/28/2023    8:34 AM  CBC  WBC 4.0 - 10.5 K/uL 4.9   Hemoglobin 12.0 - 15.0 g/dL 89.8   Hematocrit 63.9 - 46.0 % 28.1   Platelets 150 - 400 K/uL 116      Assessment and plan- Patient is a 63 y.o. 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  She is here for on treatment assessment prior to cycle 5 of weekly cisplatin  chemotherapy  Patient last received cisplatin  on 12/07/2023.  Her treatments have been on hold due to neutropenia and thrombocytopenia.  Cisplatin  dose was also reduced to 30 mg/m.  Today her ANC is more than 1 and platelet counts more than 75 and therefore okay to proceed with cycle 5 of weekly cisplatin  chemotherapy today.  She will receive 2 days of Zarzio tomorrow and day after.  She will directly proceed for cycle 6 of cisplatin  chemotherapy next week and I will see her back in 2 weeks for cycle 7 which would tentatively be her last cycleShe completes radiation  therapy on 01/18/2024  CKD: Overall stable continue to monitor  Hyponatremia: She will receive pre and post cisplatin  fluids today.   Visit Diagnosis 1. Encounter for antineoplastic chemotherapy   2. Vulvar cancer (HCC)      Dr. Annah Skene, MD, MPH [facility] [phone] 12/28/2023 8:38 AM

## 2023-12-28 NOTE — Patient Instructions (Signed)
 [facility] - A DEPT OF [facility]  Discharge Instructions: Thank you for choosing [facility] to provide your oncology and hematology care.  If you have a lab appointment with the Cancer Center, please go directly to the Cancer Center and check in at the registration area.  Wear comfortable clothing and clothing appropriate for easy access to any Portacath or PICC line.   We strive to give you quality time with your provider. You may need to reschedule your appointment if you arrive late (15 or more minutes).  Arriving late affects you and other patients whose appointments are after yours.  Also, if you miss three or more appointments without notifying the office, you may be dismissed from the clinic at the provider's discretion.      For prescription refill requests, have your pharmacy contact our office and allow 72 hours for refills to be completed.    Today you received the following chemotherapy and/or immunotherapy agents- cisplatin      To help prevent nausea and vomiting after your treatment, we encourage you to take your nausea medication as directed.  BELOW ARE SYMPTOMS THAT SHOULD BE REPORTED IMMEDIATELY: *FEVER GREATER THAN 100.4 F (38 C) OR HIGHER *CHILLS OR SWEATING *NAUSEA AND VOMITING THAT IS NOT CONTROLLED WITH YOUR NAUSEA MEDICATION *UNUSUAL SHORTNESS OF BREATH *UNUSUAL BRUISING OR BLEEDING *URINARY PROBLEMS (pain or burning when urinating, or frequent urination) *BOWEL PROBLEMS (unusual diarrhea, constipation, pain near the anus) TENDERNESS IN MOUTH AND THROAT WITH OR WITHOUT PRESENCE OF ULCERS (sore throat, sores in mouth, or a toothache) UNUSUAL RASH, SWELLING OR PAIN  UNUSUAL VAGINAL DISCHARGE OR ITCHING   Items with * indicate a potential emergency and should be followed up as soon as possible or go to the Emergency Department if any problems should occur.  Please show the CHEMOTHERAPY ALERT CARD or IMMUNOTHERAPY  ALERT CARD at check-in to the Emergency Department and triage nurse.  Should you have questions after your visit or need to cancel or reschedule your appointment, please contact [facility] - A DEPT OF [facility]  [phone] and follow the prompts.  Office hours are 8:00 a.m. to 4:30 p.m. Monday - Friday. Please note that voicemails left after 4:00 p.m. may not be returned until the following business day.  We are closed weekends and major holidays. You have access to a nurse at all times for urgent questions. Please call the main number to the clinic [phone] and follow the prompts.  For any non-urgent questions, you may also contact your provider using MyChart. We now offer e-Visits for anyone 9 and older to request care online for non-urgent symptoms. For details visit [email or web address].   Also download the MyChart app! Go to the app store, search "MyChart", open the app, select , and log in with your MyChart username and password.

## 2023-12-28 NOTE — Progress Notes (Signed)
 Last night only slept 2 hours.  Constipated, can't eat good; uses lactulose and dulcolax as needed.  BM every 4-5 days.  Would like to discuss platelet level, 3 weeks w/o chemo; says she feels much better than she has been.

## 2023-12-28 NOTE — Telephone Encounter (Signed)
 Attempted to contact patient.  Unable to reach.  Left message for patient to call me.  [name] Patient Pharmacologist [facility]

## 2023-12-29 ENCOUNTER — Ambulatory Visit
Admission: RE | Admit: 2023-12-29 | Discharge: 2023-12-29 | Disposition: A | Discharge status: Home / Self Care | Source: Ambulatory Visit | Attending: Radiation Oncology | Admitting: Radiation Oncology

## 2023-12-29 ENCOUNTER — Telehealth: Payer: Self-pay | Admitting: Psychiatry

## 2023-12-29 ENCOUNTER — Inpatient Hospital Stay

## 2023-12-29 ENCOUNTER — Other Ambulatory Visit: Payer: Self-pay

## 2023-12-29 ENCOUNTER — Encounter: Payer: Self-pay | Admitting: Oncology

## 2023-12-29 DIAGNOSIS — Z510 Encounter for antineoplastic radiation therapy: Secondary | ICD-10-CM | POA: Diagnosis not present

## 2023-12-29 DIAGNOSIS — F4323 Adjustment disorder with mixed anxiety and depressed mood: Secondary | ICD-10-CM

## 2023-12-29 DIAGNOSIS — C519 Malignant neoplasm of vulva, unspecified: Secondary | ICD-10-CM

## 2023-12-29 DIAGNOSIS — F4010 Social phobia, unspecified: Secondary | ICD-10-CM

## 2023-12-29 LAB — RAD ONC ARIA SESSION SUMMARY
Course Elapsed Days: 43
Plan Fractions Treated to Date: 23
Plan Prescribed Dose Per Fraction: 1.8 Gy
Plan Total Fractions Prescribed: 36
Plan Total Prescribed Dose: 64.8 Gy
Reference Point Dosage Given to Date: 41.4 Gy
Reference Point Session Dosage Given: 1.8 Gy
Session Number: 23

## 2023-12-29 MED ORDER — CLONAZEPAM 0.5 MG PO TABS: 0.5000 mg | ORAL_TABLET | Freq: Every day | ORAL | 2 refills | Status: DC | PRN

## 2023-12-29 MED ORDER — FILGRASTIM-SNDZ 480 MCG/0.8ML IJ SOSY
480.0000 ug | PREFILLED_SYRINGE | Freq: Every day | INTRAMUSCULAR | Status: DC
  Administered 2023-12-29: 480 ug via SUBCUTANEOUS
  Filled 2023-12-29: qty 0.8

## 2023-12-29 NOTE — Telephone Encounter (Signed)
 I have sent clonazepam  to pharmacy as requested.

## 2023-12-30 ENCOUNTER — Ambulatory Visit

## 2023-12-30 ENCOUNTER — Encounter: Payer: Self-pay | Admitting: Oncology

## 2023-12-30 ENCOUNTER — Telehealth: Admitting: Psychiatry

## 2023-12-30 ENCOUNTER — Ambulatory Visit
Admission: RE | Admit: 2023-12-30 | Discharge: 2023-12-30 | Disposition: A | Discharge status: Home / Self Care | Source: Ambulatory Visit | Attending: Radiation Oncology | Admitting: Radiation Oncology

## 2023-12-30 ENCOUNTER — Other Ambulatory Visit: Payer: Self-pay

## 2023-12-30 ENCOUNTER — Inpatient Hospital Stay

## 2023-12-30 DIAGNOSIS — Z510 Encounter for antineoplastic radiation therapy: Secondary | ICD-10-CM | POA: Diagnosis not present

## 2023-12-30 LAB — RAD ONC ARIA SESSION SUMMARY
Course Elapsed Days: 44
Plan Fractions Treated to Date: 24
Plan Prescribed Dose Per Fraction: 1.8 Gy
Plan Total Fractions Prescribed: 36
Plan Total Prescribed Dose: 64.8 Gy
Reference Point Dosage Given to Date: 43.2 Gy
Reference Point Session Dosage Given: 1.8 Gy
Session Number: 24

## 2023-12-31 ENCOUNTER — Inpatient Hospital Stay

## 2023-12-31 ENCOUNTER — Ambulatory Visit
Admission: RE | Admit: 2023-12-31 | Discharge: 2023-12-31 | Disposition: A | Discharge status: Home / Self Care | Source: Ambulatory Visit | Attending: Radiation Oncology | Admitting: Radiation Oncology

## 2023-12-31 ENCOUNTER — Telehealth: Admitting: Psychiatry

## 2023-12-31 ENCOUNTER — Other Ambulatory Visit: Payer: Self-pay

## 2023-12-31 DIAGNOSIS — C519 Malignant neoplasm of vulva, unspecified: Secondary | ICD-10-CM

## 2023-12-31 DIAGNOSIS — Z510 Encounter for antineoplastic radiation therapy: Secondary | ICD-10-CM | POA: Diagnosis not present

## 2023-12-31 LAB — RAD ONC ARIA SESSION SUMMARY
Course Elapsed Days: 45
Plan Fractions Treated to Date: 25
Plan Prescribed Dose Per Fraction: 1.8 Gy
Plan Total Fractions Prescribed: 36
Plan Total Prescribed Dose: 64.8 Gy
Reference Point Dosage Given to Date: 45 Gy
Reference Point Session Dosage Given: 1.8 Gy
Session Number: 25

## 2023-12-31 MED ORDER — FILGRASTIM-SNDZ 480 MCG/0.8ML IJ SOSY
480.0000 ug | PREFILLED_SYRINGE | Freq: Every day | INTRAMUSCULAR | Status: DC
  Administered 2023-12-31: 480 ug via SUBCUTANEOUS
  Filled 2023-12-31: qty 0.8

## 2024-01-03 ENCOUNTER — Ambulatory Visit
Admission: RE | Admit: 2024-01-03 | Discharge: 2024-01-03 | Disposition: A | Source: Ambulatory Visit | Attending: Radiation Oncology | Admitting: Radiation Oncology

## 2024-01-03 ENCOUNTER — Other Ambulatory Visit: Payer: Self-pay

## 2024-01-03 DIAGNOSIS — Z510 Encounter for antineoplastic radiation therapy: Secondary | ICD-10-CM | POA: Diagnosis not present

## 2024-01-03 LAB — RAD ONC ARIA SESSION SUMMARY
Course Elapsed Days: 48
Plan Fractions Treated to Date: 26
Plan Prescribed Dose Per Fraction: 1.8 Gy
Plan Total Fractions Prescribed: 36
Plan Total Prescribed Dose: 64.8 Gy
Reference Point Dosage Given to Date: 46.8 Gy
Reference Point Session Dosage Given: 1.8 Gy
Session Number: 26

## 2024-01-03 MED FILL — Fosaprepitant Dimeglumine For IV Infusion 150 MG (Base Eq): INTRAVENOUS | Qty: 5 | Status: AC

## 2024-01-04 ENCOUNTER — Inpatient Hospital Stay

## 2024-01-04 ENCOUNTER — Other Ambulatory Visit: Payer: Self-pay

## 2024-01-04 ENCOUNTER — Encounter: Payer: Self-pay | Admitting: Oncology

## 2024-01-04 ENCOUNTER — Inpatient Hospital Stay: Admitting: Oncology

## 2024-01-04 ENCOUNTER — Ambulatory Visit
Admission: RE | Admit: 2024-01-04 | Discharge: 2024-01-04 | Disposition: A | Discharge status: Home / Self Care | Source: Ambulatory Visit | Attending: Radiation Oncology | Admitting: Radiation Oncology

## 2024-01-04 VITALS — BP 131/71 | HR 87 | Temp 96.7°F | Resp 18

## 2024-01-04 VITALS — BP 125/68 | HR 81 | Temp 98.6°F | Resp 17 | Wt 181.3 lb

## 2024-01-04 DIAGNOSIS — E871 Hypo-osmolality and hyponatremia: Secondary | ICD-10-CM

## 2024-01-04 DIAGNOSIS — Z5111 Encounter for antineoplastic chemotherapy: Secondary | ICD-10-CM

## 2024-01-04 DIAGNOSIS — C519 Malignant neoplasm of vulva, unspecified: Secondary | ICD-10-CM

## 2024-01-04 DIAGNOSIS — N1831 Chronic kidney disease, stage 3a: Secondary | ICD-10-CM | POA: Diagnosis not present

## 2024-01-04 DIAGNOSIS — Z510 Encounter for antineoplastic radiation therapy: Secondary | ICD-10-CM | POA: Diagnosis not present

## 2024-01-04 LAB — CBC WITH DIFFERENTIAL (CANCER CENTER ONLY)
Abs Immature Granulocytes: 0.09 K/uL — ABNORMAL HIGH (ref 0.00–0.07)
Basophils Absolute: 0 K/uL (ref 0.0–0.1)
Basophils Relative: 1 %
Eosinophils Absolute: 0 K/uL (ref 0.0–0.5)
Eosinophils Relative: 1 %
HCT: 27.1 % — ABNORMAL LOW (ref 36.0–46.0)
Hemoglobin: 9.5 g/dL — ABNORMAL LOW (ref 12.0–15.0)
Immature Granulocytes: 2 %
Lymphocytes Relative: 14 %
Lymphs Abs: 0.6 K/uL — ABNORMAL LOW (ref 0.7–4.0)
MCH: 37 pg — ABNORMAL HIGH (ref 26.0–34.0)
MCHC: 35.1 g/dL (ref 30.0–36.0)
MCV: 105.4 fL — ABNORMAL HIGH (ref 80.0–100.0)
Monocytes Absolute: 0.6 K/uL (ref 0.1–1.0)
Monocytes Relative: 14 %
Neutro Abs: 3 K/uL (ref 1.7–7.7)
Neutrophils Relative %: 68 %
Platelet Count: 147 K/uL — ABNORMAL LOW (ref 150–400)
RBC: 2.57 MIL/uL — ABNORMAL LOW (ref 3.87–5.11)
RDW: 17.3 % — ABNORMAL HIGH (ref 11.5–15.5)
WBC Count: 4.4 K/uL (ref 4.0–10.5)
nRBC: 0 % (ref 0.0–0.2)

## 2024-01-04 LAB — RAD ONC ARIA SESSION SUMMARY
Course Elapsed Days: 49
Plan Fractions Treated to Date: 27
Plan Prescribed Dose Per Fraction: 1.8 Gy
Plan Total Fractions Prescribed: 36
Plan Total Prescribed Dose: 64.8 Gy
Reference Point Dosage Given to Date: 48.6 Gy
Reference Point Session Dosage Given: 1.8 Gy
Session Number: 27

## 2024-01-04 LAB — CBC (CANCER CENTER ONLY)
HCT: 26.3 % — ABNORMAL LOW (ref 36.0–46.0)
Hemoglobin: 9.4 g/dL — ABNORMAL LOW (ref 12.0–15.0)
MCH: 37.9 pg — ABNORMAL HIGH (ref 26.0–34.0)
MCHC: 35.7 g/dL (ref 30.0–36.0)
MCV: 106 fL — ABNORMAL HIGH (ref 80.0–100.0)
Platelet Count: 132 K/uL — ABNORMAL LOW (ref 150–400)
RBC: 2.48 MIL/uL — ABNORMAL LOW (ref 3.87–5.11)
RDW: 17 % — ABNORMAL HIGH (ref 11.5–15.5)
WBC Count: 5.3 K/uL (ref 4.0–10.5)
nRBC: 0 % (ref 0.0–0.2)

## 2024-01-04 LAB — CMP (CANCER CENTER ONLY)
ALT: 28 U/L (ref 0–44)
AST: 31 U/L (ref 15–41)
Albumin: 3.9 g/dL (ref 3.5–5.0)
Alkaline Phosphatase: 130 U/L — ABNORMAL HIGH (ref 38–126)
Anion gap: 8 (ref 5–15)
BUN: 12 mg/dL (ref 8–23)
CO2: 27 mmol/L (ref 22–32)
Calcium: 8.7 mg/dL — ABNORMAL LOW (ref 8.9–10.3)
Chloride: 93 mmol/L — ABNORMAL LOW (ref 98–111)
Creatinine: 1.17 mg/dL — ABNORMAL HIGH (ref 0.44–1.00)
GFR, Estimated: 52 mL/min — ABNORMAL LOW (ref >60)
Glucose, Bld: 116 mg/dL — ABNORMAL HIGH (ref 70–99)
Potassium: 3.9 mmol/L (ref 3.5–5.1)
Sodium: 128 mmol/L — ABNORMAL LOW (ref 135–145)
Total Bilirubin: 0.8 mg/dL (ref 0.0–1.2)
Total Protein: 6.5 g/dL (ref 6.5–8.1)

## 2024-01-04 MED ORDER — SODIUM CHLORIDE 0.9 % IV SOLN
150.0000 mg | Freq: Once | INTRAVENOUS | Status: AC
  Administered 2024-01-04: 150 mg via INTRAVENOUS
  Filled 2024-01-04: qty 150
  Filled 2024-01-04: qty 5

## 2024-01-04 MED ORDER — SODIUM CHLORIDE 0.9 % IV SOLN
30.0000 mg/m2 | Freq: Once | INTRAVENOUS | Status: AC
  Administered 2024-01-04: 57 mg via INTRAVENOUS
  Filled 2024-01-04: qty 57

## 2024-01-04 MED ORDER — POTASSIUM CHLORIDE IN NACL 20-0.9 MEQ/L-% IV SOLN: Freq: Once | INTRAVENOUS | Status: AC

## 2024-01-04 MED ORDER — MAGNESIUM SULFATE 2 GM/50ML IV SOLN
2.0000 g | Freq: Once | INTRAVENOUS | Status: AC
  Administered 2024-01-04: 2 g via INTRAVENOUS
  Filled 2024-01-04: qty 50

## 2024-01-04 MED ORDER — SODIUM CHLORIDE 0.9 % IV SOLN
INTRAVENOUS | Status: DC
  Filled 2024-01-04: qty 250

## 2024-01-04 MED ORDER — PALONOSETRON HCL INJECTION 0.25 MG/5ML
0.2500 mg | Freq: Once | INTRAVENOUS | Status: AC
  Administered 2024-01-04: 0.25 mg via INTRAVENOUS
  Filled 2024-01-04: qty 5

## 2024-01-04 MED ORDER — DEXAMETHASONE SODIUM PHOSPHATE 10 MG/ML IJ SOLN
10.0000 mg | Freq: Once | INTRAMUSCULAR | Status: AC
  Administered 2024-01-04: 10 mg via INTRAVENOUS
  Filled 2024-01-04: qty 1

## 2024-01-04 NOTE — Progress Notes (Signed)
 Dr. Melanee asked me to put in an ophthalmologist referral for this patient. She was specific in saying the patient did not want to go to [facility] nor did she want to go to [place].  Secure chat sent to infusion nurse indicating Unfortunately the only ophthalmologists in the [facility], [place], and [place] is [facility].  I can look for places in [place] she can be referred to.  Can you please ask the patient if she's in agreeance with this?.  Per Secure chat patient relayed to nurse she said she will not go to [place] or [facility]. she said she will find out of a place she thinks she's heard of?[facility]  Informed Dr. Melanee mentioned [place] or [place]; I searched that and did not obtain any results. If she prefers to find her own place she can give us  the name and I'd be glad to send the referral for her.  Per infusion nurse she just keeps telling me her eyes hurt but she wont use the drops in her purse.

## 2024-01-04 NOTE — Progress Notes (Signed)
 Hematology/Oncology Consult note [facility]  [phone] [phone]  Patient Care Team: Entzminger, Ethridge LABOR, MD as PCP - General (Internal Medicine) [name] Shasta POUR, RN as Oncology Nurse Navigator Maurie Rayfield BIRCH, RN as Oncology Nurse Navigator [name], MD as Consulting Physician (Oncology) Lenn Aran, MD as Consulting Physician (Radiation Oncology)   Name of the patient: Patricia Good  [id number]  28-Feb-1961   Date of visit: 01/04/24  Diagnosis-  1.  History of breast cancer in remission 2.  Locally advanced vulvar carcinoma    Chief complaint/ Reason for visit-on treatment assessment prior to cycle 6 of weekly cisplatin  chemotherapy  Heme/Onc history: Patient is a 63 year old female with history of stage I left breast cancer ER/PR positive HER2 negative.  She underwent lumpectomy and sentinel lymph node biopsy.  She declined adjuvant chemotherapy despite her recurrence score of 28.  She also stopped radiation therapy prematurely.She is presently on letrozole  for endocrine therapy.  She has baseline osteoporosis for which she is on Fosamax.   She was seen by infectious disease for herpetic like lesions but overall lesions were concerning for malignancy and patient was seen by Dr. Lovetta April 2025.  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    PET CT scan on 09/10/2023 showed: 1. Superficial right vulvar lesion measuring up to 3.3 cm in length with maximum SUV 13.6, compatible with malignancy. 2. Small amount of accentuated activity along the right upper medial thigh just below the crease with the pubis, with associated soft tissue density measuring about 0.7 by 1.7 cm, with maximum SUV  7.7, concerning for malignancy. 3. Right inguinal and right external iliac lymph nodes with maximum above blood pool, suspicious for metastatic disease.   Plan is for weekly cisplatin  for concurrent chemoradiation    Interval history-patient still complains of bilateral burning sensation in her eyes.  Denies any other complaints at this time and is otherwise tolerating chemotherapy well.  Denies any decrease in her vision or excessive tearing  ECOG PS- 1 Pain scale- 0   Review of systems- Review of Systems  Constitutional:  Negative for chills, fever, malaise/fatigue and weight loss.  HENT:  Negative for congestion, ear discharge and nosebleeds.   Eyes:  Positive for redness. Negative for blurred vision.  Respiratory:  Negative for cough, hemoptysis, sputum production, shortness of breath and wheezing.   Cardiovascular:  Negative for chest pain, palpitations, orthopnea and claudication.  Gastrointestinal:  Negative for abdominal pain, blood in stool, constipation, diarrhea, heartburn, melena, nausea and vomiting.  Genitourinary:  Negative for dysuria, flank pain, frequency, hematuria and urgency.  Musculoskeletal:  Negative for back pain, joint pain and myalgias.  Skin:  Negative for rash.  Neurological:  Negative for dizziness, tingling, focal weakness, seizures, weakness and headaches.  Endo/Heme/Allergies:  Does not bruise/bleed easily.  Psychiatric/Behavioral:  Negative for depression and suicidal ideas. The patient does not have insomnia.       Allergies  Allergen Reactions   Shellfish Allergy Anaphylaxis    Only to crab meat (non-imitation), not shrimp   Sulfasalazine Anaphylaxis and Hives    Other reaction(s): Other (See Comments)   Lisinopril     unknown     Past Medical History:  Diagnosis Date   Abnormal thyroid  stimulating hormone (TSH) level    Anemia  Anxiety    Asthma    Bipolar 1 disorder (HCC)    COPD (chronic obstructive pulmonary disease) (HCC)     Endometriosis    GERD (gastroesophageal reflux disease)    Hepatitis A    Hypertension    Hypothyroidism    Malignant neoplasm of upper-inner quadrant of left breast in female, 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   Personal history of radiation therapy    Pre-diabetes    PTSD (post-traumatic stress disorder)    Schizoaffective disorder [facility])      Past Surgical History:  Procedure Laterality Date   BREAST BIOPSY Left 03/03/2022   Left breast stereo bx Ribbon Clip- path pending   BREAST BIOPSY Left 03/03/2022   MM LT BREAST BX W LOC DEV 1ST LESION IMAGE BX SPEC STEREO GUIDE 03/03/2022 [facility]   BREAST LUMPECTOMY Left 03/20/2022   ECTOPIC PREGNANCY SURGERY     IR IMAGING GUIDED PORT INSERTION  11/11/2023   PART MASTECTOMY,RADIO FREQUENCY LOCALIZER,AXILLARY SENTINEL NODE BIOPSY Left 03/20/2022   Procedure: PART MASTECTOMY,RADIO FREQUENCY LOCALIZER,AXILLARY SENTINEL NODE BIOPSY;  Surgeon: Rodolph Romano, MD;  Location: [facility];  Service: General;  Laterality: Left;   TOTAL ABDOMINAL HYSTERECTOMY W/ BILATERAL SALPINGOOPHORECTOMY  1990    Social History   Socioeconomic History   Marital status: Single    Spouse name: Not on file   Number of children: 0   Years of education: Not on file   Highest education level: Associate degree: occupational, Scientist, product/process development, or vocational program  Occupational History   Not on file  Tobacco Use   Smoking status: Every Day    Current packs/day: 0.50    Average packs/day: 0.5 packs/day for 54.9 years (27.5 ttl pk-yrs)    Types: Cigarettes    Start date: 01/22/1969   Smokeless tobacco: Never   Tobacco comments:    Continues to go down on number per day to 5-7 per day.   Vaping Use   Vaping status: Never Used  Substance and Sexual Activity   Alcohol use: Not Currently    Alcohol/week: 1.0 - 2.0 standard drink of alcohol    Types: 1 Glasses of wine per week    Comment: occasional   Drug use: No   Sexual activity: Not  Currently    Birth control/protection: None  Other Topics Concern   Not on file  Social History Narrative   Lives alone   Social Drivers of Health   Financial Resource Strain: Medium Risk (08/03/2023)   Received from [facility]   Overall Financial Resource Strain (CARDIA)    Difficulty of Paying Living Expenses: Somewhat hard  Food Insecurity: Food Insecurity Present (11/15/2023)   Hunger Vital Sign    Worried About Running Out of Food in the Last Year: Sometimes true    Ran Out of Food in the Last Year: Sometimes true  Transportation Needs: No Transportation Needs (08/19/2023)   PRAPARE - Administrator, Civil Service (Medical): No    Lack of Transportation (Non-Medical): No  Physical Activity: Inactive (06/15/2017)   Exercise Vital Sign    Days of Exercise per Week: 0 days    Minutes of Exercise per Session: 0 min  Stress: Stress Concern Present (06/15/2017)   [employer] of Occupational Health - Occupational Stress Questionnaire    Feeling of Stress : Very much  Social Connections: Socially Isolated (06/15/2017)   Social Connection and Isolation Panel    Frequency of Communication with Friends and Family: Never    Frequency  of Social Gatherings with Friends and Family: Never    Attends Religious Services: Never    Database administrator or Organizations: No    Attends Banker Meetings: Never    Marital Status: Divorced  Catering manager Violence: Not At Risk (08/19/2023)   Humiliation, Afraid, Rape, and Kick questionnaire    Fear of Current or Ex-Partner: No    Emotionally Abused: No    Physically Abused: No    Sexually Abused: No    Family History  Problem Relation Age of Onset   Hypertension Father    Diabetes Father    Heart disease Father    Blindness Father    Alcohol abuse Sister    Drug abuse Sister    Anxiety disorder Sister    Depression Sister    Obesity Brother    Arthritis Brother    Alcohol abuse Brother     Drug abuse Brother    Depression Brother    Breast cancer Cousin    Clotting disorder Neg Hx      Current Outpatient Medications:    albuterol  (VENTOLIN  HFA) 108 (90 Base) MCG/ACT inhaler, 2 puffs Inhalation every 4-6 hrs for 30 days for shortness of breath or wheezing, Disp: , Rfl:    alendronate (FOSAMAX) 70 MG tablet, Take 70 mg by mouth once a week., Disp: , Rfl:    amLODipine  (NORVASC ) 10 MG tablet, 1 tablet Orally Once a day for 100 days for high blood pressure, Disp: , Rfl:    atorvastatin (LIPITOR) 20 MG tablet, Take 20 mg by mouth at bedtime., Disp: , Rfl:    buPROPion  (WELLBUTRIN  XL) 150 MG 24 hr tablet, TAKE 1 TABLET BY MOUTH DAILY ALONG WITH 300 MG TABLET FOR TOTAL OF 450 MG DAILY, Disp: 90 tablet, Rfl: 1   buPROPion  (WELLBUTRIN  XL) 300 MG 24 hr tablet, Take 1 tablet (300 mg total) by mouth daily. Take along with 150 mg daily , total of 450 mg daily, Disp: 90 tablet, Rfl: 1   Calcium  Carb-Cholecalciferol 500-10 MG-MCG TABS, Take 1 tablet by mouth., Disp: , Rfl:    calcium -vitamin D  (OSCAL WITH D) 500-200 MG-UNIT tablet, Take 1 tablet by mouth daily with breakfast., Disp: , Rfl:    clonazePAM  (KLONOPIN ) 0.5 MG tablet, Take 1 tablet (0.5 mg total) by mouth daily as needed for anxiety. Please use it only for severe panic attacks, do not combine with Oxycodone , Disp: 15 tablet, Rfl: 2   dexamethasone  (DECADRON ) 4 MG tablet, Take 2 tablets daily x 3 days starting the day after chemotherapy. Take with food., Disp: 30 tablet, Rfl: 1   fluticasone  (FLONASE ) 50 MCG/ACT nasal spray, Place into both nostrils as needed for allergies or rhinitis., Disp: , Rfl:    furosemide  (LASIX ) 20 MG tablet, 1 tablet Orally Once a day for 100 days high blood pressure, Disp: , Rfl:    hydrOXYzine  (ATARAX ) 10 MG tablet, Take 2 tablets (20 mg total) by mouth at bedtime as needed. for sleep and anxiety, Disp: 180 tablet, Rfl: 0   Lactulose 20 GM/30ML SOLN, 15 mL Orally twice a day for 30 days as needed for  constipation, Disp: , Rfl:    letrozole  (FEMARA ) 2.5 MG tablet, TAKE 1 TABLET BY MOUTH DAILY, Disp: 90 tablet, Rfl: 3   levocetirizine (XYZAL) 5 MG tablet, 1 tablet in the evening Orally Once a day for 100 days as needed for allergy symptoms, Disp: , Rfl:    levothyroxine  (SYNTHROID , LEVOTHROID) 112 MCG tablet, ,  Disp: , Rfl:    OLANZapine  (ZYPREXA ) 5 MG tablet, Take 1 tablet (5 mg total) by mouth at bedtime., Disp: 90 tablet, Rfl: 3   olopatadine  (PATANOL) 0.1 % ophthalmic solution, Place 1 drop into both eyes 2 (two) times daily., Disp: 5 mL, Rfl: 1   omeprazole (PRILOSEC) 40 MG capsule, Take 40 mg by mouth daily. , Disp: , Rfl:    ondansetron  (ZOFRAN ) 8 MG tablet, Take 1 tablet (8 mg total) by mouth every 8hrs as needed for nausea or vomiting. Start on the third day after cisplatin ., Disp: 30 tablet, Rfl: 1   oxyCODONE  (OXY IR/ROXICODONE ) 5 MG immediate release tablet, Take 0.5-1 tablets (2.5-5 mg total) by mouth every 8 (eight) hours as needed for severe pain (pain score 7-10) (for pain unrelieved by tylenol )., Disp: 30 tablet, Rfl: 0   potassium chloride  SA (KLOR-CON  M) 20 MEQ tablet, Take 1 tablet (20 mEq total) by mouth daily. For low potassium, Disp: 30 tablet, Rfl: 0   prazosin  (MINIPRESS ) 2 MG capsule, TAKE ONE CAPSULE BY MOUTH AT BEDTIME., Disp: 90 capsule, Rfl: 1   prochlorperazine  (COMPAZINE ) 10 MG tablet, Take 1 tablet (10 mg total) by mouth every 6 (six) hours as needed for nausea or vomiting., Disp: 30 tablet, Rfl: 1   tiotropium (SPIRIVA ) 18 MCG inhalation capsule, Place 18 mcg into inhaler and inhale daily., Disp: , Rfl:    traZODone  (DESYREL ) 50 MG tablet, TAKE ONE TABLET BY MOUTH AT BEDTIME (Patient taking differently: Take 50 mg by mouth at bedtime as needed for sleep.), Disp: 30 tablet, Rfl: 0   varenicline  (CHANTIX ) 0.5 MG tablet, Take 1 tablet (0.5 mg total) by mouth 2 (two) times daily., Disp: 60 tablet, Rfl: 1   lidocaine -prilocaine  (EMLA ) cream, Apply 1 Application topically  as needed. Apply a small amount to port site 1 hour before chemotherapy (Patient not taking: Reported on 01/04/2024), Disp: 30 g, Rfl: 2 No current facility-administered medications for this visit.  Facility-Administered Medications Ordered in Other Visits:    0.9 %  sodium chloride  infusion, , Intravenous, Continuous, [name], MD, Last Rate: 10 mL/hr at 01/04/24 0930, New Bag at 01/04/24 0930   CISplatin  (PLATINOL ) 57 mg in sodium chloride  0.9 % 250 mL chemo infusion, 30 mg/m2 (Treatment Plan Recorded), Intravenous, Once, [name], MD   dexamethasone  (DECADRON ) injection 10 mg, 10 mg, Intravenous, Once, [name], MD   fosaprepitant  (EMEND) 150 mg in sodium chloride  0.9 % 145 mL IVPB, 150 mg, Intravenous, Once, [name], MD   magnesium  sulfate IVPB 2 g 50 mL, 2 g, Intravenous, Once, [name], MD, Last Rate: 50 mL/hr at 01/04/24 1007, 2 g at 01/04/24 1007   palonosetron  (ALOXI ) injection 0.25 mg, 0.25 mg, Intravenous, Once, [name], MD  Physical exam:  Vitals:   01/04/24 0858  BP: 125/68  Pulse: 81  Resp: 17  Temp: 98.6 F (37 C)  SpO2: 98%  Weight: 181 lb 4.8 oz (82.2 kg)   Physical Exam Eyes:     General: No scleral icterus.    Conjunctiva/sclera: Conjunctivae normal.     Comments: No overt tearing or conjunctival injection noted.  Patient has some degree of photophobia and is wearing dark glasses  Cardiovascular:     Rate and Rhythm: Normal rate and regular rhythm.     Heart sounds: Normal heart sounds.  Pulmonary:     Effort: Pulmonary effort is normal.     Breath sounds: Normal breath sounds.  Abdominal:     General: Bowel sounds are normal.     Palpations: Abdomen is soft.  Skin:    General: Skin is warm and dry.  Neurological:     Mental Status: She is alert and oriented to person, place, and time.      I have personally reviewed labs listed below:    Latest Ref Rng & Units 01/04/2024    8:49 AM  CMP  Glucose 70 - 99 mg/dL 883    BUN 8 - 23 mg/dL 12   Creatinine 9.55 - 1.00 mg/dL 8.82   Sodium 864 - 854 mmol/L 128   Potassium 3.5 - 5.1 mmol/L 3.9   Chloride 98 - 111 mmol/L 93   CO2 22 - 32 mmol/L 27   Calcium  8.9 - 10.3 mg/dL 8.7   Total Protein 6.5 - 8.1 g/dL 6.5   Total Bilirubin 0.0 - 1.2 mg/dL 0.8   Alkaline Phos 38 - 126 U/L 130   AST 15 - 41 U/L 31   ALT 0 - 44 U/L 28       Latest Ref Rng & Units 01/04/2024    9:47 AM  CBC  WBC 4.0 - 10.5 K/uL 4.4   Hemoglobin 12.0 - 15.0 g/dL 9.5   Hematocrit 63.9 - 46.0 % 27.1   Platelets 150 - 400 K/uL 147       Assessment and plan- Patient is a 63 y.o. 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  She is here for on treatment assessment prior to cycle 6 of weekly cisplatin  chemotherapy  Counts okay to proceed with cycle 6 of weekly cisplatin  chemotherapy today.  I will see her back in 1 week for cycle 7 which will be her last cycle.  She completes radiation treatment on 01/17/2024.  I will plan to get a repeat PET scan in about 8 weeks after completion of radiation.  She also has an appointment coming up with GYN next month.  Chemo-induced anemia: Stable continue to monitor  Patient has chronic hyponatremia prior to start of chemotherapy.  Continue to monitor  I am referring her to ophthalmology for chronic bilateral eye irritation.  On my exam today I did not notice any redness or signs of conjunctivitis   Visit Diagnosis 1. Encounter for antineoplastic chemotherapy   2. Vulvar cancer (HCC)      Dr. Annah Skene, MD, MPH [facility] [phone] 01/04/2024 10:32 AM

## 2024-01-04 NOTE — Progress Notes (Signed)
 Per Dr. Melanee Belton to treat using Mag, ANC from 9/23. New CBC w/ diff ordered for today   Maudie FORBES Andreas, PharmD, BCPS Clinical Pharmacist

## 2024-01-05 ENCOUNTER — Ambulatory Visit
Admission: RE | Admit: 2024-01-05 | Discharge: 2024-01-05 | Disposition: A | Discharge status: Home / Self Care | Source: Ambulatory Visit | Attending: Radiation Oncology | Admitting: Radiation Oncology

## 2024-01-05 ENCOUNTER — Encounter: Payer: Self-pay | Admitting: Oncology

## 2024-01-05 ENCOUNTER — Other Ambulatory Visit: Payer: Self-pay

## 2024-01-05 ENCOUNTER — Ambulatory Visit

## 2024-01-05 ENCOUNTER — Inpatient Hospital Stay

## 2024-01-05 ENCOUNTER — Ambulatory Visit: Admitting: Psychiatry

## 2024-01-05 DIAGNOSIS — Z5189 Encounter for other specified aftercare: Secondary | ICD-10-CM | POA: Insufficient documentation

## 2024-01-05 DIAGNOSIS — Z5111 Encounter for antineoplastic chemotherapy: Secondary | ICD-10-CM | POA: Diagnosis not present

## 2024-01-05 DIAGNOSIS — Z510 Encounter for antineoplastic radiation therapy: Secondary | ICD-10-CM | POA: Insufficient documentation

## 2024-01-05 DIAGNOSIS — D6481 Anemia due to antineoplastic chemotherapy: Secondary | ICD-10-CM | POA: Insufficient documentation

## 2024-01-05 DIAGNOSIS — C519 Malignant neoplasm of vulva, unspecified: Secondary | ICD-10-CM | POA: Insufficient documentation

## 2024-01-05 DIAGNOSIS — T451X5A Adverse effect of antineoplastic and immunosuppressive drugs, initial encounter: Secondary | ICD-10-CM | POA: Diagnosis not present

## 2024-01-05 DIAGNOSIS — D61810 Antineoplastic chemotherapy induced pancytopenia: Secondary | ICD-10-CM | POA: Diagnosis not present

## 2024-01-05 DIAGNOSIS — F17210 Nicotine dependence, cigarettes, uncomplicated: Secondary | ICD-10-CM | POA: Insufficient documentation

## 2024-01-05 DIAGNOSIS — M810 Age-related osteoporosis without current pathological fracture: Secondary | ICD-10-CM | POA: Insufficient documentation

## 2024-01-05 DIAGNOSIS — Z853 Personal history of malignant neoplasm of breast: Secondary | ICD-10-CM | POA: Diagnosis not present

## 2024-01-05 DIAGNOSIS — Z79899 Other long term (current) drug therapy: Secondary | ICD-10-CM | POA: Insufficient documentation

## 2024-01-05 DIAGNOSIS — Z923 Personal history of irradiation: Secondary | ICD-10-CM | POA: Insufficient documentation

## 2024-01-05 DIAGNOSIS — Z79811 Long term (current) use of aromatase inhibitors: Secondary | ICD-10-CM | POA: Diagnosis not present

## 2024-01-05 LAB — RAD ONC ARIA SESSION SUMMARY
Course Elapsed Days: 50
Plan Fractions Treated to Date: 28
Plan Prescribed Dose Per Fraction: 1.8 Gy
Plan Total Fractions Prescribed: 36
Plan Total Prescribed Dose: 64.8 Gy
Reference Point Dosage Given to Date: 50.4 Gy
Reference Point Session Dosage Given: 1.8 Gy
Session Number: 28

## 2024-01-05 MED ORDER — FILGRASTIM-SNDZ 480 MCG/0.8ML IJ SOSY
480.0000 ug | PREFILLED_SYRINGE | Freq: Every day | INTRAMUSCULAR | Status: DC
  Administered 2024-01-05: 480 ug via SUBCUTANEOUS
  Filled 2024-01-05: qty 0.8

## 2024-01-06 ENCOUNTER — Ambulatory Visit
Admission: RE | Admit: 2024-01-06 | Discharge: 2024-01-06 | Disposition: A | Source: Ambulatory Visit | Attending: Radiation Oncology | Admitting: Radiation Oncology

## 2024-01-06 ENCOUNTER — Inpatient Hospital Stay

## 2024-01-06 ENCOUNTER — Ambulatory Visit

## 2024-01-06 ENCOUNTER — Other Ambulatory Visit: Payer: Self-pay

## 2024-01-06 DIAGNOSIS — C519 Malignant neoplasm of vulva, unspecified: Secondary | ICD-10-CM

## 2024-01-06 DIAGNOSIS — Z510 Encounter for antineoplastic radiation therapy: Secondary | ICD-10-CM | POA: Diagnosis not present

## 2024-01-06 LAB — RAD ONC ARIA SESSION SUMMARY
Course Elapsed Days: 51
Plan Fractions Treated to Date: 29
Plan Prescribed Dose Per Fraction: 1.8 Gy
Plan Total Fractions Prescribed: 36
Plan Total Prescribed Dose: 64.8 Gy
Reference Point Dosage Given to Date: 52.2 Gy
Reference Point Session Dosage Given: 1.8 Gy
Session Number: 29

## 2024-01-06 MED ORDER — FILGRASTIM-SNDZ 480 MCG/0.8ML IJ SOSY
480.0000 ug | PREFILLED_SYRINGE | Freq: Every day | INTRAMUSCULAR | Status: DC
  Administered 2024-01-06: 480 ug via SUBCUTANEOUS
  Filled 2024-01-06: qty 0.8

## 2024-01-07 ENCOUNTER — Other Ambulatory Visit: Payer: Self-pay | Admitting: *Deleted

## 2024-01-07 ENCOUNTER — Ambulatory Visit
Admission: RE | Admit: 2024-01-07 | Discharge: 2024-01-07 | Disposition: A | Discharge status: Home / Self Care | Source: Ambulatory Visit | Attending: Radiation Oncology | Admitting: Radiation Oncology

## 2024-01-07 ENCOUNTER — Ambulatory Visit

## 2024-01-07 ENCOUNTER — Other Ambulatory Visit: Payer: Self-pay

## 2024-01-07 DIAGNOSIS — Z510 Encounter for antineoplastic radiation therapy: Secondary | ICD-10-CM | POA: Diagnosis not present

## 2024-01-07 LAB — RAD ONC ARIA SESSION SUMMARY
Course Elapsed Days: 52
Plan Fractions Treated to Date: 30
Plan Prescribed Dose Per Fraction: 1.8 Gy
Plan Total Fractions Prescribed: 36
Plan Total Prescribed Dose: 64.8 Gy
Reference Point Dosage Given to Date: 54 Gy
Reference Point Session Dosage Given: 1.8 Gy
Session Number: 30

## 2024-01-07 NOTE — Progress Notes (Signed)
 error

## 2024-01-10 ENCOUNTER — Ambulatory Visit
Admission: RE | Admit: 2024-01-10 | Discharge: 2024-01-10 | Disposition: A | Discharge status: Home / Self Care | Source: Ambulatory Visit | Attending: Radiation Oncology | Admitting: Radiation Oncology

## 2024-01-10 ENCOUNTER — Ambulatory Visit

## 2024-01-10 ENCOUNTER — Other Ambulatory Visit: Payer: Self-pay

## 2024-01-10 DIAGNOSIS — Z510 Encounter for antineoplastic radiation therapy: Secondary | ICD-10-CM | POA: Diagnosis not present

## 2024-01-10 LAB — RAD ONC ARIA SESSION SUMMARY
Course Elapsed Days: 55
Plan Fractions Treated to Date: 31
Plan Prescribed Dose Per Fraction: 1.8 Gy
Plan Total Fractions Prescribed: 36
Plan Total Prescribed Dose: 64.8 Gy
Reference Point Dosage Given to Date: 55.8 Gy
Reference Point Session Dosage Given: 1.8 Gy
Session Number: 31

## 2024-01-10 MED FILL — Fosaprepitant Dimeglumine For IV Infusion 150 MG (Base Eq): INTRAVENOUS | Qty: 5 | Status: AC

## 2024-01-11 ENCOUNTER — Inpatient Hospital Stay

## 2024-01-11 ENCOUNTER — Ambulatory Visit
Admission: RE | Admit: 2024-01-11 | Discharge: 2024-01-11 | Disposition: A | Discharge status: Home / Self Care | Source: Ambulatory Visit | Attending: Radiation Oncology | Admitting: Radiation Oncology

## 2024-01-11 ENCOUNTER — Encounter: Payer: Self-pay | Admitting: Oncology

## 2024-01-11 ENCOUNTER — Other Ambulatory Visit: Payer: Self-pay

## 2024-01-11 ENCOUNTER — Inpatient Hospital Stay: Admitting: Oncology

## 2024-01-11 ENCOUNTER — Ambulatory Visit

## 2024-01-11 VITALS — BP 134/60 | HR 86 | Temp 97.0°F | Resp 16 | Wt 180.0 lb

## 2024-01-11 VITALS — BP 139/68 | HR 78 | Temp 97.0°F | Resp 18

## 2024-01-11 DIAGNOSIS — G4700 Insomnia, unspecified: Secondary | ICD-10-CM | POA: Diagnosis not present

## 2024-01-11 DIAGNOSIS — Z79811 Long term (current) use of aromatase inhibitors: Secondary | ICD-10-CM | POA: Diagnosis not present

## 2024-01-11 DIAGNOSIS — C519 Malignant neoplasm of vulva, unspecified: Secondary | ICD-10-CM

## 2024-01-11 DIAGNOSIS — Z510 Encounter for antineoplastic radiation therapy: Secondary | ICD-10-CM | POA: Diagnosis not present

## 2024-01-11 DIAGNOSIS — D6959 Other secondary thrombocytopenia: Secondary | ICD-10-CM | POA: Diagnosis not present

## 2024-01-11 DIAGNOSIS — Z853 Personal history of malignant neoplasm of breast: Secondary | ICD-10-CM

## 2024-01-11 DIAGNOSIS — T451X5A Adverse effect of antineoplastic and immunosuppressive drugs, initial encounter: Secondary | ICD-10-CM

## 2024-01-11 DIAGNOSIS — Z5111 Encounter for antineoplastic chemotherapy: Secondary | ICD-10-CM

## 2024-01-11 DIAGNOSIS — Z08 Encounter for follow-up examination after completed treatment for malignant neoplasm: Secondary | ICD-10-CM

## 2024-01-11 LAB — CBC WITH DIFFERENTIAL (CANCER CENTER ONLY)
Abs Immature Granulocytes: 0.07 K/uL (ref 0.00–0.07)
Basophils Absolute: 0 K/uL (ref 0.0–0.1)
Basophils Relative: 1 %
Eosinophils Absolute: 0.1 K/uL (ref 0.0–0.5)
Eosinophils Relative: 2 %
HCT: 25.4 % — ABNORMAL LOW (ref 36.0–46.0)
Hemoglobin: 9.2 g/dL — ABNORMAL LOW (ref 12.0–15.0)
Immature Granulocytes: 2 %
Lymphocytes Relative: 9 %
Lymphs Abs: 0.4 K/uL — ABNORMAL LOW (ref 0.7–4.0)
MCH: 38.3 pg — ABNORMAL HIGH (ref 26.0–34.0)
MCHC: 36.2 g/dL — ABNORMAL HIGH (ref 30.0–36.0)
MCV: 105.8 fL — ABNORMAL HIGH (ref 80.0–100.0)
Monocytes Absolute: 0.8 K/uL (ref 0.1–1.0)
Monocytes Relative: 17 %
Neutro Abs: 3.3 K/uL (ref 1.7–7.7)
Neutrophils Relative %: 69 %
Platelet Count: 105 K/uL — ABNORMAL LOW (ref 150–400)
RBC: 2.4 MIL/uL — ABNORMAL LOW (ref 3.87–5.11)
RDW: 17.9 % — ABNORMAL HIGH (ref 11.5–15.5)
WBC Count: 4.7 K/uL (ref 4.0–10.5)
nRBC: 0 % (ref 0.0–0.2)

## 2024-01-11 LAB — BASIC METABOLIC PANEL - CANCER CENTER ONLY
Anion gap: 9 (ref 5–15)
BUN: 9 mg/dL (ref 8–23)
CO2: 27 mmol/L (ref 22–32)
Calcium: 8.5 mg/dL — ABNORMAL LOW (ref 8.9–10.3)
Chloride: 93 mmol/L — ABNORMAL LOW (ref 98–111)
Creatinine: 1.13 mg/dL — ABNORMAL HIGH (ref 0.44–1.00)
GFR, Estimated: 54 mL/min — ABNORMAL LOW (ref >60)
Glucose, Bld: 119 mg/dL — ABNORMAL HIGH (ref 70–99)
Potassium: 3.6 mmol/L (ref 3.5–5.1)
Sodium: 129 mmol/L — ABNORMAL LOW (ref 135–145)

## 2024-01-11 LAB — FOLATE: Folate: >20 ng/mL (ref >5.9)

## 2024-01-11 LAB — RAD ONC ARIA SESSION SUMMARY
Course Elapsed Days: 56
Plan Fractions Treated to Date: 32
Plan Prescribed Dose Per Fraction: 1.8 Gy
Plan Total Fractions Prescribed: 36
Plan Total Prescribed Dose: 64.8 Gy
Reference Point Dosage Given to Date: 57.6 Gy
Reference Point Session Dosage Given: 1.8 Gy
Session Number: 32

## 2024-01-11 LAB — IRON AND TIBC
Iron: 82 ug/dL (ref 28–170)
Saturation Ratios: 33 % — ABNORMAL HIGH (ref 10.4–31.8)
TIBC: 251 ug/dL (ref 250–450)
UIBC: 169 ug/dL

## 2024-01-11 LAB — MAGNESIUM: Magnesium: 1.8 mg/dL (ref 1.7–2.4)

## 2024-01-11 LAB — FERRITIN: Ferritin: 204 ng/mL (ref 11–307)

## 2024-01-11 LAB — VITAMIN B12: Vitamin B-12: 2173 pg/mL — ABNORMAL HIGH (ref 180–914)

## 2024-01-11 MED ORDER — MAGNESIUM SULFATE 2 GM/50ML IV SOLN
2.0000 g | Freq: Once | INTRAVENOUS | Status: AC
  Administered 2024-01-11: 2 g via INTRAVENOUS
  Filled 2024-01-11: qty 50

## 2024-01-11 MED ORDER — SODIUM CHLORIDE 0.9 % IV SOLN
INTRAVENOUS | Status: DC
  Filled 2024-01-11: qty 250

## 2024-01-11 MED ORDER — PALONOSETRON HCL INJECTION 0.25 MG/5ML
0.2500 mg | Freq: Once | INTRAVENOUS | Status: AC
  Administered 2024-01-11: 0.25 mg via INTRAVENOUS
  Filled 2024-01-11: qty 5

## 2024-01-11 MED ORDER — POTASSIUM CHLORIDE IN NACL 20-0.9 MEQ/L-% IV SOLN
Freq: Once | INTRAVENOUS | Status: AC
  Filled 2024-01-11: qty 1000

## 2024-01-11 MED ORDER — SODIUM CHLORIDE 0.9 % IV SOLN
150.0000 mg | Freq: Once | INTRAVENOUS | Status: AC
  Administered 2024-01-11: 150 mg via INTRAVENOUS
  Filled 2024-01-11: qty 150

## 2024-01-11 MED ORDER — SODIUM CHLORIDE 0.9 % IV SOLN
30.0000 mg/m2 | Freq: Once | INTRAVENOUS | Status: AC
  Administered 2024-01-11: 57 mg via INTRAVENOUS
  Filled 2024-01-11: qty 57

## 2024-01-11 MED ORDER — DEXAMETHASONE SODIUM PHOSPHATE 10 MG/ML IJ SOLN
10.0000 mg | Freq: Once | INTRAMUSCULAR | Status: AC
  Administered 2024-01-11: 10 mg via INTRAVENOUS
  Filled 2024-01-11: qty 1

## 2024-01-11 NOTE — Addendum Note (Signed)
 Addended by: Sanvi Ehler on: 01/11/2024 09:32 AM   Modules accepted: Orders

## 2024-01-11 NOTE — Progress Notes (Signed)
 Hematology/Oncology Consult note [facility]  [phone] [phone]  Patient Care Team: Entzminger, Ethridge LABOR, MD as PCP - General (Internal Medicine) [name] Shasta POUR, RN as Oncology Nurse Navigator Maurie Rayfield BIRCH, RN as Oncology Nurse Navigator [name], MD as Consulting Physician (Oncology) Lenn Aran, MD as Consulting Physician (Radiation Oncology)   Name of the patient: Patricia Good  [id number]  1960/07/27   Date of visit: 01/11/24  Diagnosis-  1.  History of breast cancer in remission 2.  Locally advanced vulvar carcinoma  Chief complaint/ Reason for visit-on treatment assessment prior to cycle 7 of weekly cisplatin  chemotherapy  Heme/Onc history: Patient is a 63 year old female with history of stage I left breast cancer ER/PR positive HER2 negative.  She underwent lumpectomy and sentinel lymph node biopsy.  She declined adjuvant chemotherapy despite her recurrence score of 28.  She also stopped radiation therapy prematurely.She is presently on letrozole  for endocrine therapy.  She has baseline osteoporosis for which she is on Fosamax.   She was seen by infectious disease for herpetic like lesions but overall lesions were concerning for malignancy and patient was seen by Dr. Lovetta April 2025.  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    PET CT scan on 09/10/2023 showed: 1. Superficial right vulvar lesion measuring up to 3.3 cm in length with maximum SUV 13.6, compatible with malignancy. 2. Small amount of accentuated activity along the right upper medial thigh just below the crease with the pubis, with associated soft tissue density measuring about 0.7 by 1.7 cm, with maximum SUV  7.7, concerning for malignancy. 3. Right inguinal and right external iliac lymph nodes with maximum above blood pool, suspicious for metastatic disease.   Plan is for weekly cisplatin  for concurrent chemoradiation    Interval history- Patricia Good is a 63 year old female with vulvar cancer who presents for her last chemotherapy session.  Her current symptoms include a poor appetite, although her weight has remained relatively stable at 180 pounds. No neuropathy in her hands and feet, and no nausea or vomiting. She experiences tearing in her eyes, which has worsened over the past few weeks, requiring her to wear sunglasses indoors due to sensitivity to bright light. She has not been able to secure an appointment with an eye care provider due to insurance issues.   ECOG PS- 1 Pain scale- 0   Review of systems- Review of Systems  Constitutional:  Positive for malaise/fatigue. Negative for chills, fever and weight loss.       Lack of appetite  HENT:  Negative for congestion, ear discharge and nosebleeds.   Eyes:  Positive for photophobia. Negative for blurred vision.  Respiratory:  Negative for cough, hemoptysis, sputum production, shortness of breath and wheezing.   Cardiovascular:  Negative for chest pain, palpitations, orthopnea and claudication.  Gastrointestinal:  Negative for abdominal pain, blood in stool, constipation, diarrhea, heartburn, melena, nausea and vomiting.  Genitourinary:  Negative for dysuria, flank pain, frequency, hematuria and urgency.  Musculoskeletal:  Negative for back pain, joint pain and myalgias.  Skin:  Negative for rash.  Neurological:  Negative for dizziness, tingling, focal weakness, seizures, weakness and headaches.  Endo/Heme/Allergies:  Does not bruise/bleed easily.  Psychiatric/Behavioral:  Negative for depression and suicidal ideas. The patient does not have insomnia.  Allergies  Allergen Reactions   Shellfish Allergy Anaphylaxis    Only  to crab meat (non-imitation), not shrimp   Sulfasalazine Anaphylaxis and Hives    Other reaction(s): Other (See Comments)   Lisinopril     unknown     Past Medical History:  Diagnosis Date   Abnormal thyroid  stimulating hormone (TSH) level    Anemia    Anxiety    Asthma    Bipolar 1 disorder (HCC)    COPD (chronic obstructive pulmonary disease) (HCC)    Endometriosis    GERD (gastroesophageal reflux disease)    Hepatitis A    Hypertension    Hypothyroidism    Malignant neoplasm of upper-inner quadrant of left breast in female, 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   Personal history of radiation therapy    Pre-diabetes    PTSD (post-traumatic stress disorder)    Schizoaffective disorder (HCC)      Past Surgical History:  Procedure Laterality Date   BREAST BIOPSY Left 03/03/2022   Left breast stereo bx Ribbon Clip- path pending   BREAST BIOPSY Left 03/03/2022   MM LT BREAST BX W LOC DEV 1ST LESION IMAGE BX SPEC STEREO GUIDE 03/03/2022 [facility]   BREAST LUMPECTOMY Left 03/20/2022   ECTOPIC PREGNANCY SURGERY     IR IMAGING GUIDED PORT INSERTION  11/11/2023   PART MASTECTOMY,RADIO FREQUENCY LOCALIZER,AXILLARY SENTINEL NODE BIOPSY Left 03/20/2022   Procedure: PART MASTECTOMY,RADIO FREQUENCY LOCALIZER,AXILLARY SENTINEL NODE BIOPSY;  Surgeon: Rodolph Romano, MD;  Location: [facility];  Service: General;  Laterality: Left;   TOTAL ABDOMINAL HYSTERECTOMY W/ BILATERAL SALPINGOOPHORECTOMY  1990    Social History   Socioeconomic History   Marital status: Single    Spouse name: Not on file   Number of children: 0   Years of education: Not on file   Highest education level: Associate degree: occupational, Scientist, product/process development, or vocational program  Occupational History   Not on file  Tobacco Use   Smoking status: Every Day    Current packs/day: 0.50    Average packs/day: 0.5 packs/day for 55.0 years (27.5 ttl pk-yrs)    Types: Cigarettes    Start date: 01/22/1969    Smokeless tobacco: Never   Tobacco comments:    Continues to go down on number per day to 5-7 per day.   Vaping Use   Vaping status: Never Used  Substance and Sexual Activity   Alcohol use: Not Currently    Alcohol/week: 1.0 - 2.0 standard drink of alcohol    Types: 1 Glasses of wine per week    Comment: occasional   Drug use: No   Sexual activity: Not Currently    Birth control/protection: None  Other Topics Concern   Not on file  Social History Narrative   Lives alone   Social Drivers of Health   Financial Resource Strain: Medium Risk (08/03/2023)   Received from [facility]   Overall Financial Resource Strain (CARDIA)    Difficulty of Paying Living Expenses: Somewhat hard  Food Insecurity: Food Insecurity Present (11/15/2023)   Hunger Vital Sign    Worried About Running Out of Food in the Last Year: Sometimes true    Ran Out of Food in the Last Year: Sometimes true  Transportation Needs: No Transportation Needs (08/19/2023)   PRAPARE - Administrator, Civil Service (Medical): No    Lack of Transportation (Non-Medical): No  Physical Activity: Inactive (06/15/2017)   Exercise Vital Sign    Days of Exercise per Week:  0 days    Minutes of Exercise per Session: 0 min  Stress: Stress Concern Present (06/15/2017)   [employer] of Occupational Health - Occupational Stress Questionnaire    Feeling of Stress : Very much  Social Connections: Socially Isolated (06/15/2017)   Social Connection and Isolation Panel    Frequency of Communication with Friends and Family: Never    Frequency of Social Gatherings with Friends and Family: Never    Attends Religious Services: Never    Database administrator or Organizations: No    Attends Banker Meetings: Never    Marital Status: Divorced  Catering manager Violence: Not At Risk (08/19/2023)   Humiliation, Afraid, Rape, and Kick questionnaire    Fear of Current or Ex-Partner: No    Emotionally  Abused: No    Physically Abused: No    Sexually Abused: No    Family History  Problem Relation Age of Onset   Hypertension Father    Diabetes Father    Heart disease Father    Blindness Father    Alcohol abuse Sister    Drug abuse Sister    Anxiety disorder Sister    Depression Sister    Obesity Brother    Arthritis Brother    Alcohol abuse Brother    Drug abuse Brother    Depression Brother    Breast cancer Cousin    Clotting disorder Neg Hx      Current Outpatient Medications:    albuterol  (VENTOLIN  HFA) 108 (90 Base) MCG/ACT inhaler, 2 puffs Inhalation every 4-6 hrs for 30 days for shortness of breath or wheezing, Disp: , Rfl:    alendronate (FOSAMAX) 70 MG tablet, Take 70 mg by mouth once a week., Disp: , Rfl:    amLODipine  (NORVASC ) 10 MG tablet, 1 tablet Orally Once a day for 100 days for high blood pressure, Disp: , Rfl:    atorvastatin (LIPITOR) 20 MG tablet, Take 20 mg by mouth at bedtime., Disp: , Rfl:    buPROPion  (WELLBUTRIN  XL) 150 MG 24 hr tablet, TAKE 1 TABLET BY MOUTH DAILY ALONG WITH 300 MG TABLET FOR TOTAL OF 450 MG DAILY, Disp: 90 tablet, Rfl: 1   buPROPion  (WELLBUTRIN  XL) 300 MG 24 hr tablet, Take 1 tablet (300 mg total) by mouth daily. Take along with 150 mg daily , total of 450 mg daily, Disp: 90 tablet, Rfl: 1   Calcium  Carb-Cholecalciferol 500-10 MG-MCG TABS, Take 1 tablet by mouth., Disp: , Rfl:    calcium -vitamin D  (OSCAL WITH D) 500-200 MG-UNIT tablet, Take 1 tablet by mouth daily with breakfast., Disp: , Rfl:    clonazePAM  (KLONOPIN ) 0.5 MG tablet, Take 1 tablet (0.5 mg total) by mouth daily as needed for anxiety. Please use it only for severe panic attacks, do not combine with Oxycodone , Disp: 15 tablet, Rfl: 2   dexamethasone  (DECADRON ) 4 MG tablet, Take 2 tablets daily x 3 days starting the day after chemotherapy. Take with food., Disp: 30 tablet, Rfl: 1   fluticasone  (FLONASE ) 50 MCG/ACT nasal spray, Place into both nostrils as needed for allergies  or rhinitis., Disp: , Rfl:    furosemide  (LASIX ) 20 MG tablet, 1 tablet Orally Once a day for 100 days high blood pressure, Disp: , Rfl:    hydrOXYzine  (ATARAX ) 10 MG tablet, Take 2 tablets (20 mg total) by mouth at bedtime as needed. for sleep and anxiety, Disp: 180 tablet, Rfl: 0   Lactulose 20 GM/30ML SOLN, 15 mL Orally twice a day  for 30 days as needed for constipation, Disp: , Rfl:    letrozole  (FEMARA ) 2.5 MG tablet, TAKE 1 TABLET BY MOUTH DAILY, Disp: 90 tablet, Rfl: 3   levocetirizine (XYZAL) 5 MG tablet, 1 tablet in the evening Orally Once a day for 100 days as needed for allergy symptoms, Disp: , Rfl:    levothyroxine  (SYNTHROID , LEVOTHROID) 112 MCG tablet, , Disp: , Rfl:    lidocaine -prilocaine  (EMLA ) cream, Apply 1 Application topically as needed. Apply a small amount to port site 1 hour before chemotherapy (Patient not taking: Reported on 01/04/2024), Disp: 30 g, Rfl: 2   OLANZapine  (ZYPREXA ) 5 MG tablet, Take 1 tablet (5 mg total) by mouth at bedtime., Disp: 90 tablet, Rfl: 3   olopatadine  (PATANOL) 0.1 % ophthalmic solution, Place 1 drop into both eyes 2 (two) times daily., Disp: 5 mL, Rfl: 1   omeprazole (PRILOSEC) 40 MG capsule, Take 40 mg by mouth daily. , Disp: , Rfl:    ondansetron  (ZOFRAN ) 8 MG tablet, Take 1 tablet (8 mg total) by mouth every 8hrs as needed for nausea or vomiting. Start on the third day after cisplatin ., Disp: 30 tablet, Rfl: 1   oxyCODONE  (OXY IR/ROXICODONE ) 5 MG immediate release tablet, Take 0.5-1 tablets (2.5-5 mg total) by mouth every 8 (eight) hours as needed for severe pain (pain score 7-10) (for pain unrelieved by tylenol )., Disp: 30 tablet, Rfl: 0   potassium chloride  SA (KLOR-CON  M) 20 MEQ tablet, Take 1 tablet (20 mEq total) by mouth daily. For low potassium, Disp: 30 tablet, Rfl: 0   prazosin  (MINIPRESS ) 2 MG capsule, TAKE ONE CAPSULE BY MOUTH AT BEDTIME., Disp: 90 capsule, Rfl: 1   prochlorperazine  (COMPAZINE ) 10 MG tablet, Take 1 tablet (10 mg total)  by mouth every 6 (six) hours as needed for nausea or vomiting., Disp: 30 tablet, Rfl: 1   tiotropium (SPIRIVA ) 18 MCG inhalation capsule, Place 18 mcg into inhaler and inhale daily., Disp: , Rfl:    traZODone  (DESYREL ) 50 MG tablet, TAKE ONE TABLET BY MOUTH AT BEDTIME (Patient taking differently: Take 50 mg by mouth at bedtime as needed for sleep.), Disp: 30 tablet, Rfl: 0   varenicline  (CHANTIX ) 0.5 MG tablet, Take 1 tablet (0.5 mg total) by mouth 2 (two) times daily., Disp: 60 tablet, Rfl: 1  Physical exam: There were no vitals filed for this visit. Physical Exam Cardiovascular:     Rate and Rhythm: Normal rate and regular rhythm.     Heart sounds: Normal heart sounds.  Pulmonary:     Effort: Pulmonary effort is normal.     Breath sounds: Normal breath sounds.  Skin:    General: Skin is warm and dry.  Neurological:     Mental Status: She is alert and oriented to person, place, and time.      I have personally reviewed labs listed below:    Latest Ref Rng & Units 01/04/2024    8:49 AM  CMP  Glucose 70 - 99 mg/dL 883   BUN 8 - 23 mg/dL 12   Creatinine 9.55 - 1.00 mg/dL 8.82   Sodium 864 - 854 mmol/L 128   Potassium 3.5 - 5.1 mmol/L 3.9   Chloride 98 - 111 mmol/L 93   CO2 22 - 32 mmol/L 27   Calcium  8.9 - 10.3 mg/dL 8.7   Total Protein 6.5 - 8.1 g/dL 6.5   Total Bilirubin 0.0 - 1.2 mg/dL 0.8   Alkaline Phos 38 - 126 U/L 130   AST  15 - 41 U/L 31   ALT 0 - 44 U/L 28       Latest Ref Rng & Units 01/04/2024    9:47 AM  CBC  WBC 4.0 - 10.5 K/uL 4.4   Hemoglobin 12.0 - 15.0 g/dL 9.5   Hematocrit 63.9 - 46.0 % 27.1   Platelets 150 - 400 K/uL 147      Assessment and plan- Patient is a 63 y.o. 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 PET scan showed non-specific uptake in a groin lymph node, which could be due to cancer or infection. Radiation to the groin area is planned to cover all bases. A follow-up scan is planned in eight weeks to assess the cancer status after inflammation from radiation subsides. - Complete radiation therapy by January 17, 2024. - Perform PET scan in eight weeks. - Evaluate PET scan results to determine further management. - Discuss port management post-PET scan based on results.  Anemia and thrombocytopenia secondary to chemotherapy Anemia with hemoglobin at 9.2 g/dL and thrombocytopenia with platelets over 100,000/L, both secondary to chemotherapy. Expected improvement in blood counts as chemotherapy has concluded.  She does have evidence of macrocytosis.  TSH normal.  Ferritin and iron studies, B12 and folate to be checked today. - Repeat labs two weeks after PET scan.  Decreased appetite during cancer treatment Decreased appetite during cancer treatment, with stable weight at 180 lbs. Expected improvement post-treatment.  Photophobia and tearing of eyes, under evaluation Photophobia and tearing of eyes, worsened in recent weeks, requiring sunglasses indoors. Referral to eye care was unsuccessful due to insurance issues. Further evaluation pending post-treatment. - Reassess eye symptoms after completion of cancer treatment.  Breast cancer, on letrozole  therapy Breast cancer currently managed with letrozole  therapy. Treatment has been on the back burner due to focus on vulvar cancer, but letrozole  is being taken daily. - Continue letrozole  therapy. - Discuss breast cancer management at next visit.  Insomnia, stable on trazodone  Insomnia well-managed with trazodone  50 mg nightly, providing effective sleep support. - Continue trazodone  50 mg nightly.   Visit Diagnosis 1. Encounter for  antineoplastic chemotherapy   2. Chemotherapy-induced thrombocytopenia   3. Vulvar cancer (HCC)   4. Encounter for follow-up surveillance of breast cancer   5. Use of letrozole  (Femara )   6. Insomnia, unspecified type      Dr. Annah Skene, MD, MPH [facility] [phone] 01/11/2024 8:49 AM

## 2024-01-12 ENCOUNTER — Ambulatory Visit

## 2024-01-12 ENCOUNTER — Other Ambulatory Visit: Payer: Self-pay

## 2024-01-12 ENCOUNTER — Ambulatory Visit
Admission: RE | Admit: 2024-01-12 | Discharge: 2024-01-12 | Disposition: A | Discharge status: Home / Self Care | Source: Ambulatory Visit | Attending: Radiation Oncology | Admitting: Radiation Oncology

## 2024-01-12 ENCOUNTER — Inpatient Hospital Stay

## 2024-01-12 DIAGNOSIS — Z510 Encounter for antineoplastic radiation therapy: Secondary | ICD-10-CM | POA: Diagnosis not present

## 2024-01-12 LAB — RAD ONC ARIA SESSION SUMMARY
Course Elapsed Days: 57
Plan Fractions Treated to Date: 33
Plan Prescribed Dose Per Fraction: 1.8 Gy
Plan Total Fractions Prescribed: 36
Plan Total Prescribed Dose: 64.8 Gy
Reference Point Dosage Given to Date: 59.4 Gy
Reference Point Session Dosage Given: 1.8 Gy
Session Number: 33

## 2024-01-13 ENCOUNTER — Ambulatory Visit

## 2024-01-13 ENCOUNTER — Other Ambulatory Visit: Payer: Self-pay

## 2024-01-13 ENCOUNTER — Ambulatory Visit
Admission: RE | Admit: 2024-01-13 | Discharge: 2024-01-13 | Disposition: A | Discharge status: Home / Self Care | Source: Ambulatory Visit | Attending: Radiation Oncology | Admitting: Radiation Oncology

## 2024-01-13 DIAGNOSIS — Z510 Encounter for antineoplastic radiation therapy: Secondary | ICD-10-CM | POA: Diagnosis not present

## 2024-01-13 LAB — RAD ONC ARIA SESSION SUMMARY
Course Elapsed Days: 58
Plan Fractions Treated to Date: 34
Plan Prescribed Dose Per Fraction: 1.8 Gy
Plan Total Fractions Prescribed: 36
Plan Total Prescribed Dose: 64.8 Gy
Reference Point Dosage Given to Date: 61.2 Gy
Reference Point Session Dosage Given: 1.8 Gy
Session Number: 34

## 2024-01-14 ENCOUNTER — Other Ambulatory Visit: Payer: Self-pay

## 2024-01-14 ENCOUNTER — Ambulatory Visit
Admission: RE | Admit: 2024-01-14 | Discharge: 2024-01-14 | Disposition: A | Discharge status: Home / Self Care | Source: Ambulatory Visit | Attending: Radiation Oncology | Admitting: Radiation Oncology

## 2024-01-14 ENCOUNTER — Ambulatory Visit

## 2024-01-14 DIAGNOSIS — Z510 Encounter for antineoplastic radiation therapy: Secondary | ICD-10-CM | POA: Diagnosis not present

## 2024-01-14 LAB — RAD ONC ARIA SESSION SUMMARY
Course Elapsed Days: 59
Plan Fractions Treated to Date: 35
Plan Prescribed Dose Per Fraction: 1.8 Gy
Plan Total Fractions Prescribed: 36
Plan Total Prescribed Dose: 64.8 Gy
Reference Point Dosage Given to Date: 63 Gy
Reference Point Session Dosage Given: 1.8 Gy
Session Number: 35

## 2024-01-17 ENCOUNTER — Ambulatory Visit

## 2024-01-17 ENCOUNTER — Ambulatory Visit
Admission: RE | Admit: 2024-01-17 | Discharge: 2024-01-17 | Disposition: A | Source: Ambulatory Visit | Attending: Radiation Oncology | Admitting: Radiation Oncology

## 2024-01-17 ENCOUNTER — Other Ambulatory Visit: Payer: Self-pay

## 2024-01-17 DIAGNOSIS — Z510 Encounter for antineoplastic radiation therapy: Secondary | ICD-10-CM | POA: Diagnosis not present

## 2024-01-17 LAB — RAD ONC ARIA SESSION SUMMARY
Course Elapsed Days: 62
Plan Fractions Treated to Date: 36
Plan Prescribed Dose Per Fraction: 1.8 Gy
Plan Total Fractions Prescribed: 36
Plan Total Prescribed Dose: 64.8 Gy
Reference Point Dosage Given to Date: 64.8 Gy
Reference Point Session Dosage Given: 1.8 Gy
Session Number: 36

## 2024-01-18 ENCOUNTER — Ambulatory Visit

## 2024-01-18 ENCOUNTER — Telehealth: Payer: Self-pay

## 2024-01-18 ENCOUNTER — Ambulatory Visit (INDEPENDENT_AMBULATORY_CARE_PROVIDER_SITE_OTHER): Admitting: Psychiatry

## 2024-01-18 ENCOUNTER — Encounter: Payer: Self-pay | Admitting: Psychiatry

## 2024-01-18 VITALS — BP 126/78 | HR 77 | Temp 98.0°F | Ht 61.0 in | Wt 181.0 lb

## 2024-01-18 DIAGNOSIS — F4310 Post-traumatic stress disorder, unspecified: Secondary | ICD-10-CM

## 2024-01-18 DIAGNOSIS — F17200 Nicotine dependence, unspecified, uncomplicated: Secondary | ICD-10-CM

## 2024-01-18 DIAGNOSIS — Z634 Disappearance and death of family member: Secondary | ICD-10-CM

## 2024-01-18 DIAGNOSIS — F250 Schizoaffective disorder, bipolar type: Secondary | ICD-10-CM

## 2024-01-18 DIAGNOSIS — F4010 Social phobia, unspecified: Secondary | ICD-10-CM | POA: Diagnosis not present

## 2024-01-18 DIAGNOSIS — F4323 Adjustment disorder with mixed anxiety and depressed mood: Secondary | ICD-10-CM

## 2024-01-18 MED ORDER — PRAZOSIN HCL 2 MG PO CAPS
2.0000 mg | ORAL_CAPSULE | Freq: Every day | ORAL | 3 refills | Status: AC
Start: 2024-01-18 — End: 2025-01-17

## 2024-01-18 MED ORDER — HYDROXYZINE HCL 10 MG PO TABS: 20.0000 mg | ORAL_TABLET | Freq: Every evening | ORAL | 1 refills | Status: AC | PRN
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
## 2024-01-18 NOTE — Telephone Encounter (Signed)
Dr. Eappen's patient

## 2024-01-18 NOTE — Telephone Encounter (Signed)
 Patient left voicemail stated that you wanted her to go to either the Emergency/Urgent care she stated that she could not go she was too hungry and needed to get something to eat called patient to see if she would be going later she stated that she is trying to contact her ophthalmologist because she does not think that her insurance will pay for Urgent care she will keep you updated.

## 2024-01-18 NOTE — Telephone Encounter (Signed)
 Noted

## 2024-01-19 ENCOUNTER — Other Ambulatory Visit: Payer: Self-pay

## 2024-01-19 ENCOUNTER — Emergency Department
Admission: EM | Admit: 2024-01-19 | Discharge: 2024-01-20 | Disposition: A | Discharge status: Home / Self Care | Attending: Emergency Medicine | Admitting: Emergency Medicine

## 2024-01-19 ENCOUNTER — Ambulatory Visit

## 2024-01-19 DIAGNOSIS — F250 Schizoaffective disorder, bipolar type: Secondary | ICD-10-CM | POA: Diagnosis present

## 2024-01-19 DIAGNOSIS — E039 Hypothyroidism, unspecified: Secondary | ICD-10-CM | POA: Insufficient documentation

## 2024-01-19 DIAGNOSIS — J449 Chronic obstructive pulmonary disease, unspecified: Secondary | ICD-10-CM | POA: Diagnosis not present

## 2024-01-19 DIAGNOSIS — F4310 Post-traumatic stress disorder, unspecified: Secondary | ICD-10-CM | POA: Diagnosis not present

## 2024-01-19 DIAGNOSIS — Z79899 Other long term (current) drug therapy: Secondary | ICD-10-CM | POA: Diagnosis not present

## 2024-01-19 DIAGNOSIS — Z853 Personal history of malignant neoplasm of breast: Secondary | ICD-10-CM | POA: Diagnosis not present

## 2024-01-19 DIAGNOSIS — J45909 Unspecified asthma, uncomplicated: Secondary | ICD-10-CM | POA: Diagnosis not present

## 2024-01-19 DIAGNOSIS — N390 Urinary tract infection, site not specified: Secondary | ICD-10-CM | POA: Diagnosis not present

## 2024-01-19 DIAGNOSIS — F4010 Social phobia, unspecified: Secondary | ICD-10-CM | POA: Insufficient documentation

## 2024-01-19 DIAGNOSIS — F29 Unspecified psychosis not due to a substance or known physiological condition: Secondary | ICD-10-CM | POA: Diagnosis not present

## 2024-01-19 LAB — CBC WITH DIFFERENTIAL/PLATELET
Abs Immature Granulocytes: 0.02 K/uL (ref 0.00–0.07)
Basophils Absolute: 0 K/uL (ref 0.0–0.1)
Basophils Relative: 1 %
Eosinophils Absolute: 0.1 K/uL (ref 0.0–0.5)
Eosinophils Relative: 2 %
HCT: 23.9 % — ABNORMAL LOW (ref 36.0–46.0)
Hemoglobin: 8.5 g/dL — ABNORMAL LOW (ref 12.0–15.0)
Immature Granulocytes: 1 %
Lymphocytes Relative: 15 %
Lymphs Abs: 0.7 K/uL (ref 0.7–4.0)
MCH: 39.5 pg — ABNORMAL HIGH (ref 26.0–34.0)
MCHC: 35.6 g/dL (ref 30.0–36.0)
MCV: 111.2 fL — ABNORMAL HIGH (ref 80.0–100.0)
Monocytes Absolute: 0.6 K/uL (ref 0.1–1.0)
Monocytes Relative: 14 %
Neutro Abs: 3 K/uL (ref 1.7–7.7)
Neutrophils Relative %: 67 %
Platelets: 62 K/uL — ABNORMAL LOW (ref 150–400)
RBC: 2.15 MIL/uL — ABNORMAL LOW (ref 3.87–5.11)
RDW: 18.9 % — ABNORMAL HIGH (ref 11.5–15.5)
Smear Review: NORMAL
WBC: 4.3 K/uL (ref 4.0–10.5)
nRBC: 0 % (ref 0.0–0.2)

## 2024-01-19 LAB — COMPREHENSIVE METABOLIC PANEL WITH GFR
ALT: 21 U/L (ref 0–44)
AST: 27 U/L (ref 15–41)
Albumin: 3.9 g/dL (ref 3.5–5.0)
Alkaline Phosphatase: 91 U/L (ref 38–126)
Anion gap: 13 (ref 5–15)
BUN: 9 mg/dL (ref 8–23)
CO2: 26 mmol/L (ref 22–32)
Calcium: 8.6 mg/dL — ABNORMAL LOW (ref 8.9–10.3)
Chloride: 89 mmol/L — ABNORMAL LOW (ref 98–111)
Creatinine, Ser: 1.15 mg/dL — ABNORMAL HIGH (ref 0.44–1.00)
GFR, Estimated: 53 mL/min — ABNORMAL LOW (ref >60)
Glucose, Bld: 101 mg/dL — ABNORMAL HIGH (ref 70–99)
Potassium: 4.3 mmol/L (ref 3.5–5.1)
Sodium: 128 mmol/L — ABNORMAL LOW (ref 135–145)
Total Bilirubin: 0.5 mg/dL (ref 0.0–1.2)
Total Protein: 6.8 g/dL (ref 6.5–8.1)

## 2024-01-19 LAB — URINALYSIS, ROUTINE W REFLEX MICROSCOPIC
Bilirubin Urine: NEGATIVE
Glucose, UA: NEGATIVE mg/dL
Ketones, ur: NEGATIVE mg/dL
Nitrite: NEGATIVE
Protein, ur: NEGATIVE mg/dL
Specific Gravity, Urine: 1.002 — ABNORMAL LOW (ref 1.005–1.030)
pH: 7 (ref 5.0–8.0)

## 2024-01-19 LAB — URINE DRUG SCREEN, QUALITATIVE (ARMC ONLY)
Amphetamines, Ur Screen: NOT DETECTED
Barbiturates, Ur Screen: NOT DETECTED
Benzodiazepine, Ur Scrn: NOT DETECTED
Cannabinoid 50 Ng, Ur ~~LOC~~: NOT DETECTED
Cocaine Metabolite,Ur ~~LOC~~: NOT DETECTED
MDMA (Ecstasy)Ur Screen: NOT DETECTED
Methadone Scn, Ur: NOT DETECTED
Opiate, Ur Screen: NOT DETECTED
Phencyclidine (PCP) Ur S: NOT DETECTED
Tricyclic, Ur Screen: NOT DETECTED

## 2024-01-19 MED ORDER — CEPHALEXIN 500 MG PO CAPS
500.0000 mg | ORAL_CAPSULE | Freq: Two times a day (BID) | ORAL | Status: DC
  Administered 2024-01-19: 500 mg via ORAL
  Filled 2024-01-19: qty 1

## 2024-01-19 MED ORDER — FOSFOMYCIN TROMETHAMINE 3 G PO PACK: 3.0000 g | PACK | Freq: Once | ORAL | Status: DC
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
## 2024-01-19 NOTE — ED Notes (Signed)
 Patient ambulatory to bathroom and is currently resting on stretcher in room. Ice water  provided per patients request. No other need stated at this time.

## 2024-01-19 NOTE — ED Notes (Signed)
 Pt given dinner tray at this time.
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
## 2024-01-19 NOTE — ED Notes (Signed)
 IVC PENDING TTS AND PSYCH CONSULT

## 2024-01-19 NOTE — ED Notes (Signed)
 Patient provided with ice water  per request. Refused wanting a snack at this time.

## 2024-01-19 NOTE — ED Notes (Signed)
 PT placed into psych clothes.  Pt belongings... Watch 2 yellow rings Necklace with a butterfly on it [employer] Black pants Pink underwear Pink bra Camo colored shirt Keys phone

## 2024-01-19 NOTE — ED Notes (Signed)
 Pt attempted to get urine sample, unable to get one at current time.
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
## 2024-01-20 ENCOUNTER — Telehealth: Payer: Self-pay | Admitting: Psychiatry

## 2024-01-20 DIAGNOSIS — F4310 Post-traumatic stress disorder, unspecified: Secondary | ICD-10-CM | POA: Diagnosis not present

## 2024-01-20 DIAGNOSIS — F250 Schizoaffective disorder, bipolar type: Secondary | ICD-10-CM

## 2024-01-20 DIAGNOSIS — N390 Urinary tract infection, site not specified: Secondary | ICD-10-CM | POA: Diagnosis not present

## 2024-01-20 DIAGNOSIS — F4010 Social phobia, unspecified: Secondary | ICD-10-CM | POA: Diagnosis not present

## 2024-01-20 LAB — URINE CULTURE

## 2024-01-20 LAB — ETHANOL: Alcohol, Ethyl (B): <15 mg/dL (ref <15)

## 2024-01-20 MED ORDER — CEPHALEXIN 500 MG PO CAPS: 500.0000 mg | ORAL_CAPSULE | Freq: Two times a day (BID) | ORAL | 0 refills | Status: AC

## 2024-01-20 MED ORDER — ACETAMINOPHEN 325 MG PO TABS
650.0000 mg | ORAL_TABLET | Freq: Once | ORAL | Status: AC
  Administered 2024-01-20: 650 mg via ORAL
  Filled 2024-01-20: qty 2

## 2024-01-20 NOTE — ED Notes (Signed)
 Rescinded by Dr. Jossie

## 2024-01-20 NOTE — Consult Note (Addendum)
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 plan, or intent and current engagement in treatment.  *RISK ASSESSMENT Risk assessment is a dynamic process; it is possible that this patient's condition, and risk level, may change. This should be re-evaluated and managed over time as appropriate. Please re-consult psychiatric consult services if additional assistance is needed in terms of risk assessment and management. If your team decides to discharge this patient, please advise the patient how to best access emergency psychiatric services, or to call 911, if their condition worsens or they feel unsafe in any way.   Ines Hock, NP [facility]

## 2024-01-20 NOTE — ED Notes (Signed)
 Pt discharged at this time. RN reviewed discharge instructions with pt. Pt verbalized understanding. Vital signs taken. RR even and unlabored. Pt denies any questions or needs at this time. Pt ambulatory at discharge. Pt has a ride home.

## 2024-01-20 NOTE — ED Notes (Signed)
 ivc/consult done/patient does not meet criteria for inpatient psych admission.

## 2024-01-20 NOTE — ED Notes (Signed)
 Blood work sent to lab at this time. Pt calm and cooperative during blood draw.

## 2024-01-20 NOTE — ED Provider Notes (Signed)
 Emergency Medicine Observation Re-evaluation Note  Patricia Good is a 63 y.o. female, seen on rounds today.  Pt initially presented to the ED for complaints of Burn  Currently, the patient is resting comfortably.  Physical Exam  BP 139/71 (BP Location: Left Arm)   Pulse 88   Temp 99.6 F (37.6 C) (Oral)   Resp 18   SpO2 98%  General: No acute distress Cardiac: Well-perfused extremities Lungs: No respiratory distress Psych: Appropriate mood and affect  ED Course / MDM  EKG:   I have reviewed the labs performed to date as well as medications administered while in observation.  Recent changes in the last 24 hours include none.  Plan  Current plan is for placement.   Jonella Redditt K, MD 01/20/24 [phone]

## 2024-01-20 NOTE — Telephone Encounter (Signed)
 Noted

## 2024-01-20 NOTE — ED Notes (Addendum)
 Pt is currently yelling ouch, stop it, and other indistinguishable phrases. When assessing the pt she reported that her left eye is hurting and she is talking to herself. Pt denies experiencing hallucinations.

## 2024-01-20 NOTE — Radiation Completion Notes (Signed)
 Patient Name: JOYLENE, Patricia Good MRN: [id number] Date of Birth: 02/11/1961 Referring Physician: ETHRIDGE MATSU, M.D. Date of Service: 2024-01-20 Radiation Oncologist: Marcey Penton, M.D. 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  She was seen by her gynecologist Dr. Lovetta for her herpetic type lesions of the vulva.  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  Tumor extended down to the anal region.  Unfortunately PET scan showed superficial right vulvar lesion measuring 3.3 cm hypermetabolic.  She also had a small amount of accentuated activity along the right upper medial thigh just below the crease with the pubis with hypermetabolic activity concerning for malignancies.  She also had right inguinal and right external iliac lymph nodes with hypermetabolic activity again suspicious for metastatic disease.  Patient had started accelerated partial breast irradiation only received 2 courses and discontinued on her own based on red area appearing.  Again she is been recommended for radiation therapy and possible concurrent chemotherapy.  She is not a surgical candidate based on the extensive nature of her disease by PET/CT  criteria.  She is seen today for consultation.  She says she is having pain in that area and is requesting pain medication for that.  She tends towards constipation is not having any lower urinary tract symptoms at this time.      ==========DELIVERED PLANS==========  First Treatment Date: 2023-11-16 Last Treatment Date: 2024-01-17   Plan Name: Pelvis_Vulva Site: Vulva Technique: IMRT Mode: Photon Dose Per Fraction: 1.8 Gy Prescribed Dose (Delivered / Prescribed): 64.8 Gy / 64.8 Gy Prescribed Fxs (Delivered / Prescribed): 36 / 36     ==========ON TREATMENT VISIT DATES========== 2023-11-16, 2023-11-23, 2023-11-30, 2023-12-07, 2023-12-14, 2023-12-21, 2023-12-28, 2024-01-05, 2024-01-11     ==========UPCOMING VISITS========== 03/20/2024 [facility] EST PT [name], MD  03/20/2024 [facility] INF PORT FLUSH W/LAB [facility]PORT FLUSH  03/07/2024 [facility] CT NM PET WB & SB TO MID THIGH [facility] INFUSION  03/07/2024 [facility] TELEPHONE OFFICE VISIT [facility], Fonda SAUNDERS, NP  02/21/2024 [facility] ONCOLOGY FOLLOW UP 30 Penton Marcey, MD  02/09/2024 [facility] EST PT [facility]  02/08/2024 [facility] FOLLOW UP 30 Eappen, Saramma, MD        ==========APPENDIX - ON TREATMENT VISIT NOTES==========   See weekly On Treatment Notes in Epic for details in the Media tab (listed as Progress notes on the On Treatment Visit Dates listed above).

## 2024-01-25 ENCOUNTER — Encounter

## 2024-01-26 ENCOUNTER — Other Ambulatory Visit: Payer: Self-pay | Admitting: Oncology

## 2024-01-26 DIAGNOSIS — C519 Malignant neoplasm of vulva, unspecified: Secondary | ICD-10-CM

## 2024-02-01 ENCOUNTER — Encounter: Payer: Self-pay | Admitting: Oncology

## 2024-02-04 ENCOUNTER — Encounter: Payer: Self-pay | Admitting: Oncology

## 2024-02-04 NOTE — Progress Notes (Signed)
 Discharge Plan Member has been discharged from [facility]. Please see below Discharge Plan for details. The Member has been provided a copy of their Discharge Plan via the [facility] portal. If you have any questions, please contact our Clinical Team at [phone]. Name Patricia Good  Date of Birth  April 09, 1960 Discharged Reason Not Engaged  Discharging to: Referring Specialist Referring Provider: Fonda Mower  Current Psychiatric Medications Unknown (Member did not attend initial behavioral health evaluation)  Psychiatric Medication Recommendation Transition: N/A (no CC med recs made)  Referrals: None  Discharge Summary Care Delivered: Member has not engaged in services Interim evaluation: No Summary of interim assessments completed Current status: Date: Feb 03 2024 Client: Patricia Good Gender: Female Age: 63 DOB: 05-29-1960  Reason for discharge: Member has not engaged in services  Additional details Should referring organization contact: Unknown if Member is interested in support [facility] remains available should patient express interest in the future.  Final Intake Summary None (Member did not attend initial behavioral health evaluation)  [facility] Provider: Rosaline PARAS, [facility] Health Care Manager

## 2024-02-07 ENCOUNTER — Other Ambulatory Visit: Payer: Self-pay | Admitting: Psychiatry

## 2024-02-07 DIAGNOSIS — F250 Schizoaffective disorder, bipolar type: Secondary | ICD-10-CM

## 2024-02-08 ENCOUNTER — Ambulatory Visit (INDEPENDENT_AMBULATORY_CARE_PROVIDER_SITE_OTHER): Admitting: Psychiatry

## 2024-02-08 ENCOUNTER — Encounter: Payer: Self-pay | Admitting: Psychiatry

## 2024-02-08 VITALS — BP 138/76 | HR 99 | Temp 97.6°F | Ht 60.0 in | Wt 179.0 lb

## 2024-02-08 DIAGNOSIS — F250 Schizoaffective disorder, bipolar type: Secondary | ICD-10-CM | POA: Diagnosis not present

## 2024-02-08 DIAGNOSIS — F4310 Post-traumatic stress disorder, unspecified: Secondary | ICD-10-CM

## 2024-02-08 DIAGNOSIS — F4010 Social phobia, unspecified: Secondary | ICD-10-CM | POA: Diagnosis not present

## 2024-02-08 DIAGNOSIS — Z634 Disappearance and death of family member: Secondary | ICD-10-CM

## 2024-02-08 DIAGNOSIS — F4323 Adjustment disorder with mixed anxiety and depressed mood: Secondary | ICD-10-CM

## 2024-02-08 DIAGNOSIS — F17200 Nicotine dependence, unspecified, uncomplicated: Secondary | ICD-10-CM

## 2024-02-08 NOTE — Patient Instructions (Addendum)
[phone] for [place] and [place].

## 2024-02-08 NOTE — Progress Notes (Unsigned)
 [facility] MD OP Progress Note  02/08/2024 6:00 PM Patricia Good  MRN:  [id number]  Chief Complaint:  Chief Complaint  Patient presents with   Follow-up   Anxiety   Hallucinations   Medication Refill   Delusional   Paranoid   Discussed the use of AI scribe software for clinical note transcription with the patient, who gave verbal consent to proceed.  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  Ongoing psychosocial stressors related to homelessness continue to affect her, as she reports living in her truck for approximately 3 weeks after leaving her previous apartment due to discomfort and feeling unsafe. She describes efforts to secure new housing and notes intermittent stays with her sister, though her sister's lease restricts long-term stays. A long-term acquaintance assists her in managing basic needs such as eating and bathing, as she parks at her residence.  She does have a history of chronic paranoia/delusions and her delusions are currently associated with her previous place of residence.  She reports when she lived there she felt burning sensation of her skin which may have been of a spiritual nature.  Since she is not living in that residence anymore she does not feel that way.  She currently denies any hallucinations.  She is currently trying to find a new place to stay and has been in contact with multiple listings.  She reports in the past she went to [facility] [place] when she was in a similar situation and was  homeless and is aware of them.  She agrees to get help.  She is also aware of other resources in the community.  She reports that her recent sleep quality has improved, and she notes a particularly restful night last night. She confirms continued use of olanzapine  5 mg, Wellbutrin , prazosin , trazodone , clonazepam  as needed, and hydroxyzine  as needed. She reports picking up her medications last week and keeping them locked in her truck.  She denies any suicidality, homicidality.    Visit Diagnosis:    ICD-10-CM   1. Schizoaffective disorder, bipolar type (HCC)  F25.0     2. PTSD (post-traumatic stress disorder)  F43.10     3. Social anxiety disorder  F40.10     4. Bereavement  Z63.4     5. Tobacco use disorder  F17.200     6. Adjustment disorder with mixed anxiety and depressed mood  F43.23       Past Psychiatric History: I have reviewed past psychiatric history from progress note on 07/16/2017.  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   Personal history of radiation therapy    Pre-diabetes    PTSD (post-traumatic stress  disorder)    Schizoaffective disorder [facility])     Past Surgical History:  Procedure Laterality Date   BREAST BIOPSY Left 03/03/2022   Left breast stereo bx Ribbon Clip- path pending   BREAST BIOPSY Left 03/03/2022   MM LT BREAST BX W LOC DEV 1ST LESION IMAGE BX SPEC STEREO GUIDE 03/03/2022 [facility]   BREAST LUMPECTOMY Left 03/20/2022   ECTOPIC PREGNANCY SURGERY     IR IMAGING GUIDED PORT INSERTION  11/11/2023   PART MASTECTOMY,RADIO FREQUENCY  LOCALIZER,AXILLARY SENTINEL NODE BIOPSY Left 03/20/2022   Procedure: PART MASTECTOMY,RADIO FREQUENCY LOCALIZER,AXILLARY SENTINEL NODE BIOPSY;  Surgeon: Rodolph Romano, MD;  Location: [facility] ORS;  Service: General;  Laterality: Left;   TOTAL ABDOMINAL HYSTERECTOMY W/ BILATERAL SALPINGOOPHORECTOMY  1990    Family Psychiatric History: Reviewed family psychiatric history from progress note on 07/16/2017.  Family History:  Family History  Problem Relation Age of Onset   Hypertension Father    Diabetes Father    Heart disease Father    Blindness Father    Alcohol abuse Sister    Drug abuse Sister    Anxiety disorder Sister    Depression Sister    Obesity Brother    Arthritis Brother    Alcohol abuse Brother    Drug abuse Brother    Depression Brother    Breast cancer Cousin    Clotting disorder Neg Hx     Social History: I have reviewed social history from progress note on 07/16/2017. Social History   Socioeconomic History   Marital status: Single    Spouse name: Not on file   Number of children: 0   Years of education: Not on file   Highest education level: Associate degree: occupational, scientist, product/process development, or vocational program  Occupational History   Not on file  Tobacco Use   Smoking status: Every Day    Current packs/day: 0.50    Average packs/day: 0.5 packs/day for 55.0 years (27.5 ttl pk-yrs)    Types: Cigarettes    Start date: 01/22/1969   Smokeless tobacco: Never   Tobacco comments:    Continues to go down on number per day to 5-7 per day. Tried Chantix  but stated after several weeks not helping  Vaping Use   Vaping status: Never Used  Substance and Sexual Activity   Alcohol use: Not Currently    Alcohol/week: 1.0 - 2.0 standard drink of alcohol    Types: 1 Glasses of wine per week    Comment: occasional   Drug use: No   Sexual activity: Not Currently    Birth control/protection: None  Other Topics Concern   Not on file  Social History Narrative   Lives alone    Social Drivers of Health   Financial Resource Strain: Medium Risk (08/03/2023)   Received from [facility]   Overall Financial Resource Strain (CARDIA)    Difficulty of Paying Living Expenses: Somewhat hard  Food Insecurity: Food Insecurity Present (11/15/2023)   Hunger Vital Sign    Worried About Running Out of Food in the Last Year: Sometimes true    Ran Out of Food in the Last Year: Sometimes true  Transportation Needs: No Transportation Needs (08/19/2023)   PRAPARE - Administrator, Civil Service (Medical): No    Lack of Transportation (Non-Medical): No  Physical Activity: Inactive (06/15/2017)   Exercise Vital Sign    Days of Exercise per Week: 0 days    Minutes of Exercise per Session: 0 min  Stress: Stress Concern  Present (06/15/2017)   [employer] of Occupational Health - Occupational Stress Questionnaire    Feeling of Stress : Very much  Social Connections: Socially Isolated (06/15/2017)   Social Connection and Isolation Panel    Frequency of Communication with Friends and Family: Never    Frequency of Social Gatherings with Friends and Family: Never    Attends Religious Services: Never    Database Administrator or Organizations: No    Attends Banker Meetings: Never    Marital Status: Divorced    Allergies:  Allergies  Allergen Reactions   Shellfish Allergy Anaphylaxis    Only to crab meat (non-imitation), not shrimp   Sulfasalazine Anaphylaxis and Hives    Other reaction(s): Other (See Comments)   Lisinopril     unknown    Metabolic Disorder Labs: Lab Results  Component Value Date   HGBA1C 5.7 (H) 05/06/2020   MPG 116.89 05/06/2020   MPG 117 12/21/2017   Lab Results  Component Value Date   PROLACTIN 1.3 (L) 05/06/2020   PROLACTIN 1.2 (L) 07/12/2017   Lab Results  Component Value Date   CHOL 193 05/06/2020   TRIG 103 05/06/2020   HDL 56 05/06/2020   CHOLHDL 3.4 05/06/2020   VLDL 21 05/06/2020    LDLCALC 116 (H) 05/06/2020   LDLCALC 163 (H) 12/21/2017   Lab Results  Component Value Date   TSH 14.152 (H) 11/22/2023   TSH 2.250 06/17/2021    Therapeutic Level Labs: No results found for: LITHIUM No results found for: VALPROATE No results found for: CBMZ  Current Medications: Current Outpatient Medications  Medication Sig Dispense Refill   albuterol  (VENTOLIN  HFA) 108 (90 Base) MCG/ACT inhaler 2 puffs Inhalation every 4-6 hrs for 30 days for shortness of breath or wheezing     alendronate (FOSAMAX) 70 MG tablet Take 70 mg by mouth once a week.     amLODipine  (NORVASC ) 10 MG tablet 1 tablet Orally Once a day for 100 days for high blood pressure     atorvastatin (LIPITOR) 20 MG tablet Take 20 mg by mouth at bedtime.     buPROPion  (WELLBUTRIN  XL) 150 MG 24 hr tablet TAKE 1 TABLET BY MOUTH DAILY ALONG WITH 300 MG TABLET FOR TOTAL OF 450 MG DAILY 90 tablet 1   buPROPion  (WELLBUTRIN  XL) 300 MG 24 hr tablet Take 1 tablet (300 mg total) by mouth daily. Take along with 150 mg daily , total of 450 mg daily 90 tablet 1   Calcium  Carb-Cholecalciferol 500-10 MG-MCG TABS Take 1 tablet by mouth.     calcium -vitamin D  (OSCAL WITH D) 500-200 MG-UNIT tablet Take 1 tablet by mouth daily with breakfast.     clonazePAM  (KLONOPIN ) 0.5 MG tablet Take 1 tablet (0.5 mg total) by mouth daily as needed for anxiety. Please use it only for severe panic attacks, do not combine with Oxycodone  15 tablet 2   dexamethasone  (DECADRON ) 4 MG tablet Take 2 tablets daily x 3 days starting the day after chemotherapy. Take with food. 30 tablet 1   furosemide  (LASIX ) 20 MG tablet 1 tablet Orally Once a day for 100 days high blood pressure     hydrOXYzine  (ATARAX ) 10 MG tablet Take 2 tablets (20 mg total) by mouth at bedtime as needed. for sleep and anxiety 180 tablet 1   letrozole  (FEMARA ) 2.5 MG tablet TAKE 1 TABLET BY MOUTH DAILY 90 tablet 3   levothyroxine  (SYNTHROID , LEVOTHROID) 112 MCG tablet      OLANZapine   (  ZYPREXA ) 5 MG tablet Take 1 tablet (5 mg total) by mouth at bedtime. 90 tablet 3   omeprazole (PRILOSEC) 40 MG capsule Take 40 mg by mouth daily.      prazosin  (MINIPRESS ) 2 MG capsule Take 1 capsule (2 mg total) by mouth at bedtime. 90 capsule 3   tiotropium (SPIRIVA ) 18 MCG inhalation capsule Place 18 mcg into inhaler and inhale daily.     traZODone  (DESYREL ) 50 MG tablet TAKE 1 TABLET BY MOUTH AT BEDTIME 30 tablet 0   fluticasone  (FLONASE ) 50 MCG/ACT nasal spray Place into both nostrils as needed for allergies or rhinitis. (Patient not taking: Reported on 02/08/2024)     Lactulose 20 GM/30ML SOLN 15 mL Orally twice a day for 30 days as needed for constipation (Patient not taking: Reported on 02/08/2024)     levocetirizine (XYZAL) 5 MG tablet 1 tablet in the evening Orally Once a day for 100 days as needed for allergy symptoms (Patient not taking: Reported on 02/08/2024)     lidocaine -prilocaine  (EMLA ) cream Apply 1 Application topically as needed. Apply a small amount to port site 1 hour before chemotherapy (Patient not taking: Reported on 02/08/2024) 30 g 2   olopatadine  (PATANOL) 0.1 % ophthalmic solution Place 1 drop into both eyes 2 (two) times daily. (Patient not taking: Reported on 02/08/2024) 5 mL 1   ondansetron  (ZOFRAN ) 8 MG tablet Take 1 tablet (8 mg total) by mouth every 8hrs as needed for nausea or vomiting. Start on the third day after cisplatin . (Patient not taking: Reported on 02/08/2024) 30 tablet 1   oxyCODONE  (OXY IR/ROXICODONE ) 5 MG immediate release tablet Take 0.5-1 tablets (2.5-5 mg total) by mouth every 8 (eight) hours as needed for severe pain (pain score 7-10) (for pain unrelieved by tylenol ). (Patient not taking: Reported on 02/08/2024) 30 tablet 0   potassium chloride  SA (KLOR-CON  M) 20 MEQ tablet Take 1 tablet (20 mEq total) by mouth daily. For low potassium (Patient not taking: Reported on 02/08/2024) 30 tablet 0   prochlorperazine  (COMPAZINE ) 10 MG tablet Take 1 tablet (10 mg  total) by mouth every 6 (six) hours as needed for nausea or vomiting. (Patient not taking: Reported on 02/08/2024) 30 tablet 1   varenicline  (CHANTIX ) 0.5 MG tablet Take 1 tablet (0.5 mg total) by mouth 2 (two) times daily. (Patient not taking: Reported on 02/08/2024) 60 tablet 1   No current facility-administered medications for this visit.     Musculoskeletal: Strength & Muscle Tone: within normal limits Gait & Station: normal Patient leans: N/A  Psychiatric Specialty Exam: Review of Systems  Psychiatric/Behavioral:         Delusions/paranoid ideation-chronic    Blood pressure 138/76, pulse 99, temperature 97.6 F (36.4 C), temperature source Temporal, height 5' (1.524 m), weight 179 lb (81.2 kg), SpO2 96%.Body mass index is 34.96 kg/m.  General Appearance: Casual  Eye Contact:  Fair  Speech:  Clear and Coherent  Volume:  Normal  Mood:  Euthymic  Affect:  Congruent  Thought Process:  Goal Directed and Descriptions of Associations: Intact  Orientation:  Full (Time, Place, and Person)  Thought Content: Delusions and Paranoid Ideation chronic, with recent worsening  Suicidal Thoughts:  No  Homicidal Thoughts:  No  Memory:  Immediate;   Fair Recent;   Fair Remote;   Fair  Judgement:  Fair  Insight:  Fair  Psychomotor Activity:  Normal  Concentration:  Concentration: Fair and Attention Span: Fair  Recall:  [employer] of Knowledge: Fair  Language: Fair  Akathisia:  No  Handed:  Right  AIMS (if indicated): not done  Assets:  Communication Skills Desire for Improvement Housing Social Support Talents/Skills Transportation  ADL's:  Intact  Cognition: WNL  Sleep:  Fair   Screenings: Midwife Visit from 01/18/2024 in [facility] Office Visit from 09/28/2023 in [facility] Office Visit from 09/09/2023 in [facility] Office Visit from  06/01/2023 in [facility] Office Visit from 04/15/2023 in [facility]  AIMS Total Score 0 0 0 0 0   AUDIT    Flowsheet Row Admission (Discharged) from 12/22/2017 in [facility]  Alcohol Use Disorder Identification Test Final Score (AUDIT) 1   GAD-7    Flowsheet Row Office Visit from 02/08/2024 in [facility] Office Visit from 01/18/2024 in [facility] Office Visit from 06/01/2023 in [facility] Office Visit from 04/15/2023 in [facility] Office Visit from 01/13/2023 in [facility]  Total GAD-7 Score 10 5 4 2 2    PHQ2-9    Flowsheet Row Office Visit from 02/08/2024 in [facility] Most recent reading at 02/08/2024  2:54 PM Office Visit from 01/18/2024 in [facility] Most recent reading at 01/18/2024  2:12 PM Infusion from 01/11/2024 in [facility] Dept Of [facility] Most recent reading at 01/11/2024 11:00 AM Office Visit from 01/11/2024 in [facility] Of [facility] Most recent reading at 01/11/2024  8:55 AM Infusion from 01/04/2024 in [facility] Of . [facility] Most recent reading at 01/04/2024 10:00 AM  PHQ-2 Total Score 1 2 0 0 0  PHQ-9 Total Score 6 6 -- -- --   Flowsheet Row Office Visit from 02/08/2024 in [facility] ED from 01/19/2024 in [facility] Department at [facility] Visit from 01/18/2024 in [facility]  C-SSRS RISK CATEGORY No Risk No Risk Moderate Risk     Assessment and Plan:  Patricia Good is a 63 year old Caucasian female who has a history of schizoaffective disorder, currently homeless, presented for a follow-up appointment, discussed assessment and plan as noted below.  1. Schizoaffective disorder, bipolar type (HCC)-unstable Recent episode of delusions/paranoia which led to leaving her apartment and currently living with family/friend/in her truck.  Her delusions are associated with a previous place of residence where she believes she was being persecuted.  She currently denies any delusional thoughts and reports that as long as she stays away from that previous residence she is fine.  She declines any further medication changes today however will consider increasing the dosage of olanzapine  in the future. Continue Olanzapine  5 mg daily Continue Wellbutrin  450 mg daily Crisis plan discussed with patient, patient to go to the nearest emergency department with any worsening symptoms   2. PTSD (post-traumatic stress disorder)-stable Denies any intrusive memories flashbacks or nightmares. Continue Prazosin  2 mg at bedtime Continue Trazodone  50 mg at bedtime.  3. Social anxiety disorder-improving Chronic social anxiety although with improvement. Continue Hydroxyzine  10 to 20 mg at bedtime as needed Continue  Clonazepam  0.5 mg daily as needed  4. Bereavement-improving Currently denies any concerns  5. Tobacco use disorder-unstable Continues to smoke cigarettes.  6. Adjustment disorder with mixed anxiety and depressed mood-improved She denies any current sadness or anxiety. Will reevaluate in future sessions.  Provided resources in the community including [employer] for homeless, congregational nursing, as well as advised her to call [facility].  Will consider referring patient to higher level of care if she continues to need more support in the community.    Follow-up Follow-up in clinic in 2 weeks or sooner if needed.   Collaboration of Care:  Collaboration of Care: Patient refused AEB patient declined referral for CBT.  Patient/Guardian was advised Release of Information must be obtained prior to any record release in order to collaborate their care with an outside provider. Patient/Guardian was advised if they have not already done so to contact the registration department to sign all necessary forms in order for us  to release information regarding their care.   Consent: Patient/Guardian gives verbal consent for treatment and assignment of benefits for services provided during this visit. Patient/Guardian expressed understanding and agreed to proceed.   This note was generated in part or whole with voice recognition software. Voice recognition is usually quite accurate but there are transcription errors that can and very often do occur. I apologize for any typographical errors that were not detected and corrected.    Leightyn Cina, MD 02/09/2024, 9:52 AM

## 2024-02-09 ENCOUNTER — Inpatient Hospital Stay: Attending: Obstetrics and Gynecology | Admitting: Nurse Practitioner

## 2024-02-09 VITALS — BP 135/57 | HR 95 | Temp 97.6°F | Ht 61.0 in | Wt 178.8 lb

## 2024-02-09 DIAGNOSIS — C519 Malignant neoplasm of vulva, unspecified: Secondary | ICD-10-CM | POA: Diagnosis not present
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
## 2024-02-14 ENCOUNTER — Encounter: Admitting: Obstetrics and Gynecology

## 2024-02-15 ENCOUNTER — Other Ambulatory Visit: Payer: Self-pay | Admitting: Oncology

## 2024-02-15 DIAGNOSIS — C519 Malignant neoplasm of vulva, unspecified: Secondary | ICD-10-CM

## 2024-02-16 ENCOUNTER — Encounter: Payer: Self-pay | Admitting: Oncology

## 2024-02-21 ENCOUNTER — Ambulatory Visit
Admission: RE | Admit: 2024-02-21 | Discharge: 2024-02-21 | Disposition: A | Discharge status: Home / Self Care | Source: Ambulatory Visit | Attending: Radiation Oncology | Admitting: Radiation Oncology

## 2024-02-21 ENCOUNTER — Other Ambulatory Visit: Payer: Self-pay | Admitting: *Deleted

## 2024-02-21 ENCOUNTER — Encounter: Payer: Self-pay | Admitting: Radiation Oncology

## 2024-02-21 VITALS — BP 131/89 | HR 82 | Temp 98.4°F | Resp 16 | Wt 174.9 lb

## 2024-02-21 DIAGNOSIS — Z9221 Personal history of antineoplastic chemotherapy: Secondary | ICD-10-CM | POA: Insufficient documentation

## 2024-02-21 DIAGNOSIS — Z923 Personal history of irradiation: Secondary | ICD-10-CM | POA: Insufficient documentation

## 2024-02-21 DIAGNOSIS — C519 Malignant neoplasm of vulva, unspecified: Secondary | ICD-10-CM | POA: Insufficient documentation

## 2024-02-21 MED ORDER — SILVER SULFADIAZINE 1 % EX CREA: 1.0000 | TOPICAL_CREAM | Freq: Every day | CUTANEOUS | 0 refills | Status: AC

## 2024-02-21 NOTE — Progress Notes (Signed)
 Radiation Oncology Follow up Note  Name: Patricia Good   Date:   02/21/2024 MRN:  [id number] DOB: [birth year]    This 63 y.o. female presents to the clinic today for 1 month follow-up status post radiation therapy for vulvar cancer and previously treated with radiation therapy for stage Ia invasive mammary carcinoma of the left breast.  REFERRING PROVIDER: Sampson Ethridge LABOR,*  HPI: Patient is a 63 year old female now out 1 month having completed radiation therapy for vulvar cancer.  Seen today in routine follow-up she has some open sores in her perineum region.  She otherwise specifically denies increased lower Neri tract symptoms diarrhea or fatigue..  Patient does have a history of PTSD social anxiety disorder COPD prior history of breast cancer.  Patient did receive concurrent chemotherapy with radiation.  COMPLICATIONS OF TREATMENT: none  FOLLOW UP COMPLIANCE: keeps appointments   PHYSICAL EXAM:  BP 131/89   Pulse 82   Temp 98.4 F (36.9 C) (Tympanic)   Resp 16   Wt 174 lb 14.4 oz (79.3 kg)   BMI 33.05 kg/m  Patient does have some opened areas which are seem to improved over time for which I am prescribing Silvadene cream.  No evidence of overt cancer is noted in the vulvar area no inguinal adenopathy is identified.  Well-developed well-nourished patient in NAD. HEENT reveals PERLA, EOMI, discs not visualized.  Oral cavity is clear. No oral mucosal lesions are identified. Neck is clear without evidence of cervical or supraclavicular adenopathy. Lungs are clear to A&P. Cardiac examination is essentially unremarkable with regular rate and rhythm without murmur rub or thrill. Abdomen is benign with no organomegaly or masses noted. Motor sensory and DTR levels are equal and symmetric in the upper and lower extremities. Cranial nerves II through XII are grossly intact. Proprioception is intact. No peripheral adenopathy or edema is identified. No motor or sensory levels are noted. Crude  visual fields are within normal range.  RADIOLOGY RESULTS: No current films for review  PLAN: Present time patient is doing well we will treat these 2 small areas of open ulceration which may be related to radiation with Silvadene cream.  She has been instructed in use.  I have also asked to see her back in 3 to 4 months for follow-up.  She continues follow-up care through the GYN oncology clinic.  Will order imaging if not performed by my next follow-up examination.  Patient comprehends my recommendations well.  Patient knows to call with any concerns.  I would like to take this opportunity to thank you for allowing me to participate in the care of your patient.SABRA Marcey Penton, MD

## 2024-02-23 ENCOUNTER — Inpatient Hospital Stay

## 2024-02-23 ENCOUNTER — Telehealth: Admitting: Psychiatry

## 2024-02-23 DIAGNOSIS — F250 Schizoaffective disorder, bipolar type: Secondary | ICD-10-CM

## 2024-02-23 NOTE — Progress Notes (Signed)
 Patient unable to connect.

## 2024-02-28 ENCOUNTER — Encounter: Payer: Self-pay | Admitting: Oncology

## 2024-02-28 ENCOUNTER — Other Ambulatory Visit: Payer: Self-pay | Admitting: Psychiatry

## 2024-02-28 DIAGNOSIS — F4010 Social phobia, unspecified: Secondary | ICD-10-CM

## 2024-02-28 DIAGNOSIS — F4323 Adjustment disorder with mixed anxiety and depressed mood: Secondary | ICD-10-CM

## 2024-03-01 ENCOUNTER — Encounter: Payer: Self-pay | Admitting: Oncology

## 2024-03-07 ENCOUNTER — Ambulatory Visit
Admission: RE | Admit: 2024-03-07 | Discharge: 2024-03-07 | Disposition: A | Discharge status: Home / Self Care | Source: Ambulatory Visit | Attending: Oncology | Admitting: Oncology

## 2024-03-07 ENCOUNTER — Inpatient Hospital Stay: Attending: Obstetrics and Gynecology | Admitting: Hospice and Palliative Medicine

## 2024-03-07 DIAGNOSIS — R7989 Other specified abnormal findings of blood chemistry: Secondary | ICD-10-CM | POA: Insufficient documentation

## 2024-03-07 DIAGNOSIS — N9089 Other specified noninflammatory disorders of vulva and perineum: Secondary | ICD-10-CM | POA: Insufficient documentation

## 2024-03-07 DIAGNOSIS — Z7983 Long term (current) use of bisphosphonates: Secondary | ICD-10-CM | POA: Insufficient documentation

## 2024-03-07 DIAGNOSIS — K8020 Calculus of gallbladder without cholecystitis without obstruction: Secondary | ICD-10-CM | POA: Diagnosis not present

## 2024-03-07 DIAGNOSIS — I700 Atherosclerosis of aorta: Secondary | ICD-10-CM | POA: Insufficient documentation

## 2024-03-07 DIAGNOSIS — Z1732 Human epidermal growth factor receptor 2 negative status: Secondary | ICD-10-CM | POA: Insufficient documentation

## 2024-03-07 DIAGNOSIS — D3502 Benign neoplasm of left adrenal gland: Secondary | ICD-10-CM | POA: Diagnosis not present

## 2024-03-07 DIAGNOSIS — C519 Malignant neoplasm of vulva, unspecified: Secondary | ICD-10-CM | POA: Insufficient documentation

## 2024-03-07 DIAGNOSIS — E860 Dehydration: Secondary | ICD-10-CM | POA: Insufficient documentation

## 2024-03-07 DIAGNOSIS — Z5902 Unsheltered homelessness: Secondary | ICD-10-CM | POA: Insufficient documentation

## 2024-03-07 DIAGNOSIS — F17210 Nicotine dependence, cigarettes, uncomplicated: Secondary | ICD-10-CM | POA: Insufficient documentation

## 2024-03-07 DIAGNOSIS — C50919 Malignant neoplasm of unspecified site of unspecified female breast: Secondary | ICD-10-CM | POA: Insufficient documentation

## 2024-03-07 DIAGNOSIS — Z9221 Personal history of antineoplastic chemotherapy: Secondary | ICD-10-CM | POA: Insufficient documentation

## 2024-03-07 DIAGNOSIS — M810 Age-related osteoporosis without current pathological fracture: Secondary | ICD-10-CM | POA: Insufficient documentation

## 2024-03-07 DIAGNOSIS — Z170 Estrogen receptor positive status [ER+]: Secondary | ICD-10-CM | POA: Insufficient documentation

## 2024-03-07 DIAGNOSIS — E876 Hypokalemia: Secondary | ICD-10-CM | POA: Insufficient documentation

## 2024-03-07 DIAGNOSIS — Z923 Personal history of irradiation: Secondary | ICD-10-CM | POA: Insufficient documentation

## 2024-03-07 DIAGNOSIS — N179 Acute kidney failure, unspecified: Secondary | ICD-10-CM | POA: Insufficient documentation

## 2024-03-07 DIAGNOSIS — Z79811 Long term (current) use of aromatase inhibitors: Secondary | ICD-10-CM | POA: Insufficient documentation

## 2024-03-07 LAB — GLUCOSE, CAPILLARY: Glucose-Capillary: 118 mg/dL — ABNORMAL HIGH (ref 70–99)

## 2024-03-07 MED ORDER — FLUDEOXYGLUCOSE F - 18 (FDG) INJECTION
8.9100 | Freq: Once | INTRAVENOUS | Status: AC | PRN
  Administered 2024-03-07: 8.91 via INTRAVENOUS
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
## 2024-03-08 ENCOUNTER — Inpatient Hospital Stay

## 2024-03-08 NOTE — Progress Notes (Signed)
 [facility] Clinical Social Work  Clinical Social Work was referred by medical provider for need for community resources.  Clinical Social Worker contacted patient by phone to offer support and assess for needs.  Patient expressed need for locating housing. Patient made aware that [facility] Clinical Social Worker does not assist with locating housing. CSW suggested patient check into government based housing, patient stated those are not options. No other needs.   Lizbeth Sprague, LCSW  Clinical Social Worker [facility]

## 2024-03-15 ENCOUNTER — Ambulatory Visit: Payer: Self-pay | Admitting: Obstetrics and Gynecology

## 2024-03-15 ENCOUNTER — Inpatient Hospital Stay

## 2024-03-15 ENCOUNTER — Other Ambulatory Visit: Payer: Self-pay | Admitting: Nurse Practitioner

## 2024-03-15 ENCOUNTER — Inpatient Hospital Stay: Admitting: Obstetrics and Gynecology

## 2024-03-15 VITALS — BP 103/88 | HR 88 | Temp 97.6°F | Ht 61.0 in | Wt 159.0 lb

## 2024-03-15 DIAGNOSIS — C519 Malignant neoplasm of vulva, unspecified: Secondary | ICD-10-CM

## 2024-03-15 DIAGNOSIS — N9089 Other specified noninflammatory disorders of vulva and perineum: Secondary | ICD-10-CM

## 2024-03-15 DIAGNOSIS — Z9221 Personal history of antineoplastic chemotherapy: Secondary | ICD-10-CM | POA: Diagnosis not present

## 2024-03-15 DIAGNOSIS — Z7983 Long term (current) use of bisphosphonates: Secondary | ICD-10-CM | POA: Diagnosis not present

## 2024-03-15 DIAGNOSIS — C50919 Malignant neoplasm of unspecified site of unspecified female breast: Secondary | ICD-10-CM | POA: Diagnosis not present

## 2024-03-15 DIAGNOSIS — Z79811 Long term (current) use of aromatase inhibitors: Secondary | ICD-10-CM | POA: Diagnosis not present

## 2024-03-15 DIAGNOSIS — Z923 Personal history of irradiation: Secondary | ICD-10-CM | POA: Diagnosis not present

## 2024-03-15 DIAGNOSIS — N179 Acute kidney failure, unspecified: Secondary | ICD-10-CM | POA: Diagnosis not present

## 2024-03-15 DIAGNOSIS — L989 Disorder of the skin and subcutaneous tissue, unspecified: Secondary | ICD-10-CM

## 2024-03-15 DIAGNOSIS — Z170 Estrogen receptor positive status [ER+]: Secondary | ICD-10-CM | POA: Diagnosis not present

## 2024-03-15 DIAGNOSIS — Z7189 Other specified counseling: Secondary | ICD-10-CM

## 2024-03-15 DIAGNOSIS — F17210 Nicotine dependence, cigarettes, uncomplicated: Secondary | ICD-10-CM | POA: Diagnosis not present

## 2024-03-15 DIAGNOSIS — E860 Dehydration: Secondary | ICD-10-CM | POA: Diagnosis not present

## 2024-03-15 DIAGNOSIS — M810 Age-related osteoporosis without current pathological fracture: Secondary | ICD-10-CM | POA: Diagnosis not present

## 2024-03-15 DIAGNOSIS — R7989 Other specified abnormal findings of blood chemistry: Secondary | ICD-10-CM | POA: Diagnosis not present

## 2024-03-15 DIAGNOSIS — Z5902 Unsheltered homelessness: Secondary | ICD-10-CM | POA: Diagnosis not present

## 2024-03-15 DIAGNOSIS — E876 Hypokalemia: Secondary | ICD-10-CM | POA: Diagnosis not present

## 2024-03-15 DIAGNOSIS — Z1732 Human epidermal growth factor receptor 2 negative status: Secondary | ICD-10-CM | POA: Diagnosis not present

## 2024-03-15 LAB — CBC WITH DIFFERENTIAL (CANCER CENTER ONLY)
Abs Immature Granulocytes: 0.01 K/uL (ref 0.00–0.07)
Basophils Absolute: 0 K/uL (ref 0.0–0.1)
Basophils Relative: 0 %
Eosinophils Absolute: 0.1 K/uL (ref 0.0–0.5)
Eosinophils Relative: 2 %
HCT: 30.1 % — ABNORMAL LOW (ref 36.0–46.0)
Hemoglobin: 10.4 g/dL — ABNORMAL LOW (ref 12.0–15.0)
Immature Granulocytes: 0 %
Lymphocytes Relative: 16 %
Lymphs Abs: 0.9 K/uL (ref 0.7–4.0)
MCH: 41.1 pg — ABNORMAL HIGH (ref 26.0–34.0)
MCHC: 34.6 g/dL (ref 30.0–36.0)
MCV: 119 fL — ABNORMAL HIGH (ref 80.0–100.0)
Monocytes Absolute: 0.8 K/uL (ref 0.1–1.0)
Monocytes Relative: 14 %
Neutro Abs: 3.6 K/uL (ref 1.7–7.7)
Neutrophils Relative %: 68 %
Platelet Count: 121 K/uL — ABNORMAL LOW (ref 150–400)
RBC: 2.53 MIL/uL — ABNORMAL LOW (ref 3.87–5.11)
RDW: 12.3 % (ref 11.5–15.5)
WBC Count: 5.3 K/uL (ref 4.0–10.5)
nRBC: 0 % (ref 0.0–0.2)

## 2024-03-15 LAB — CMP (CANCER CENTER ONLY)
ALT: 38 U/L (ref 0–44)
AST: 60 U/L — ABNORMAL HIGH (ref 15–41)
Albumin: 4 g/dL (ref 3.5–5.0)
Alkaline Phosphatase: 150 U/L — ABNORMAL HIGH (ref 38–126)
Anion gap: 12 (ref 5–15)
BUN: 12 mg/dL (ref 8–23)
CO2: 34 mmol/L — ABNORMAL HIGH (ref 22–32)
Calcium: 9.8 mg/dL (ref 8.9–10.3)
Chloride: 88 mmol/L — ABNORMAL LOW (ref 98–111)
Creatinine: 1.61 mg/dL — ABNORMAL HIGH (ref 0.44–1.00)
GFR, Estimated: 35 mL/min — ABNORMAL LOW (ref >60)
Glucose, Bld: 109 mg/dL — ABNORMAL HIGH (ref 70–99)
Potassium: 3.2 mmol/L — ABNORMAL LOW (ref 3.5–5.1)
Sodium: 135 mmol/L (ref 135–145)
Total Bilirubin: 0.5 mg/dL (ref 0.0–1.2)
Total Protein: 7.2 g/dL (ref 6.5–8.1)
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
## 2024-03-17 LAB — SURGICAL PATHOLOGY

## 2024-03-20 ENCOUNTER — Inpatient Hospital Stay

## 2024-03-20 ENCOUNTER — Inpatient Hospital Stay: Admitting: Oncology

## 2024-03-21 ENCOUNTER — Inpatient Hospital Stay: Admitting: Nurse Practitioner

## 2024-03-22 ENCOUNTER — Other Ambulatory Visit: Payer: Self-pay | Admitting: Oncology

## 2024-03-22 DIAGNOSIS — C519 Malignant neoplasm of vulva, unspecified: Secondary | ICD-10-CM

## 2024-03-23 ENCOUNTER — Encounter: Payer: Self-pay | Admitting: Psychiatry

## 2024-03-23 ENCOUNTER — Inpatient Hospital Stay: Admitting: Nurse Practitioner

## 2024-03-23 ENCOUNTER — Ambulatory Visit: Admitting: Psychiatry

## 2024-03-23 ENCOUNTER — Other Ambulatory Visit: Payer: Self-pay

## 2024-03-23 VITALS — BP 162/89 | HR 87 | Temp 97.5°F | Ht 61.0 in | Wt 157.6 lb

## 2024-03-23 DIAGNOSIS — F4310 Post-traumatic stress disorder, unspecified: Secondary | ICD-10-CM

## 2024-03-23 DIAGNOSIS — Z634 Disappearance and death of family member: Secondary | ICD-10-CM

## 2024-03-23 DIAGNOSIS — F17200 Nicotine dependence, unspecified, uncomplicated: Secondary | ICD-10-CM | POA: Diagnosis not present

## 2024-03-23 DIAGNOSIS — F4010 Social phobia, unspecified: Secondary | ICD-10-CM

## 2024-03-23 DIAGNOSIS — F250 Schizoaffective disorder, bipolar type: Secondary | ICD-10-CM | POA: Diagnosis not present

## 2024-03-23 MED ORDER — OLANZAPINE 5 MG PO TABS: 5.0000 mg | ORAL_TABLET | Freq: Every day | ORAL | 0 refills | Status: AC

## 2024-03-23 MED ORDER — BUPROPION HCL ER (XL) 300 MG PO TB24: 300.0000 mg | ORAL_TABLET | Freq: Every day | ORAL | 0 refills | Status: AC

## 2024-03-23 NOTE — Patient Instructions (Signed)
 [email or web address] in [place], [place] [address], [place], [place] [address]   Open 24 hours [phone]

## 2024-03-23 NOTE — Progress Notes (Unsigned)
 [facility] MD OP Progress Note  03/23/2024 3:36 PM Patricia Good  MRN:  [id number]  Chief Complaint:  Chief Complaint  Patient presents with   Follow-up   Anxiety   Schizoaffective disorder   Discussed the use of AI scribe software for clinical note transcription with the patient, who gave verbal consent to proceed.  History of Present Illness     Visit Diagnosis:    ICD-10-CM   1. Schizoaffective disorder, bipolar type (HCC)  F25.0 OLANZapine  (ZYPREXA ) 5 MG tablet    buPROPion  (WELLBUTRIN  XL) 300 MG 24 hr tablet    2. PTSD (post-traumatic stress disorder)  F43.10 buPROPion  (WELLBUTRIN  XL) 300 MG 24 hr tablet    3. Bereavement  Z63.4     4. Tobacco use disorder  F17.200     5. Social anxiety disorder  F40.10       Past Psychiatric History: Reviewed past psychiatric history from progress note on 07/16/2017.  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   Personal history of radiation therapy    Pre-diabetes    PTSD (post-traumatic stress disorder)    Schizoaffective disorder (HCC)     Past Surgical History:  Procedure Laterality Date   BREAST BIOPSY Left 03/03/2022   Left breast stereo bx Ribbon Clip- path pending   BREAST BIOPSY Left 03/03/2022   MM LT BREAST BX W LOC DEV 1ST LESION IMAGE BX SPEC STEREO GUIDE 03/03/2022 [facility]   BREAST LUMPECTOMY Left 03/20/2022   ECTOPIC PREGNANCY SURGERY     IR IMAGING GUIDED PORT INSERTION  11/11/2023   PART MASTECTOMY,RADIO FREQUENCY LOCALIZER,AXILLARY SENTINEL NODE BIOPSY Left 03/20/2022   Procedure: PART  MASTECTOMY,RADIO FREQUENCY LOCALIZER,AXILLARY SENTINEL NODE BIOPSY;  Surgeon: Rodolph Romano, MD;  Location: [facility];  Service: General;  Laterality: Left;   TOTAL ABDOMINAL HYSTERECTOMY W/ BILATERAL SALPINGOOPHORECTOMY  1990    Family Psychiatric History: I have reviewed family psychiatric history from progress note on 07/16/2017.  Family History:  Family History  Problem Relation Age of Onset   Hypertension Father    Diabetes Father    Heart disease Father    Blindness Father    Alcohol abuse Sister    Drug abuse Sister    Anxiety disorder Sister    Depression Sister    Obesity Brother    Arthritis Brother    Alcohol abuse Brother    Drug abuse Brother    Depression Brother    Breast cancer Cousin    Clotting disorder Neg Hx     Social History: I have reviewed social history from progress note on 07/16/2017. Social History   Socioeconomic History   Marital status: Single    Spouse name: Not on file   Number of children: 0   Years of education: Not on file   Highest education level: Associate degree: occupational, scientist, product/process development, or vocational program  Occupational History   Not on file  Tobacco Use   Smoking status: Every Day    Current packs/day: 0.50    Average packs/day: 0.5 packs/day for 55.2  years (27.6 ttl pk-yrs)    Types: Cigarettes    Start date: 01/22/1969   Smokeless tobacco: Never   Tobacco comments:    Continues to go down on number per day to 5-7 per day. Tried Chantix  but stated after several weeks not helping  Vaping Use   Vaping status: Never Used  Substance and Sexual Activity   Alcohol use: Not Currently    Alcohol/week: 1.0 - 2.0 standard drink of alcohol    Types: 1 Glasses of wine per week    Comment: occasional   Drug use: No   Sexual activity: Not Currently    Birth control/protection: None  Other Topics Concern   Not on file  Social History Narrative   Lives alone   Social Drivers of Health   Tobacco Use: High Risk  (03/23/2024)   Patient History    Smoking Tobacco Use: Every Day    Smokeless Tobacco Use: Never    Passive Exposure: Not on file  Financial Resource Strain: Medium Risk (08/03/2023)   [facility] from [facility]   Overall Financial Resource Strain (CARDIA)    Difficulty of Paying Living Expenses: Somewhat hard  Food Insecurity: Food Insecurity Present (11/15/2023)   Epic    Worried About Programme Researcher, Broadcasting/film/video in the Last Year: Sometimes true    Ran Out of Food in the Last Year: Sometimes true  Transportation Needs: No Transportation Needs (08/19/2023)   PRAPARE - Administrator, Civil Service (Medical): No    Lack of Transportation (Non-Medical): No  Physical Activity: Not on file  Stress: Not on file  Social Connections: Not on file  Depression (PHQ2-9): Low Risk (03/15/2024)   Depression (PHQ2-9)    PHQ-2 Score: 1  Recent Concern: Depression (PHQ2-9) - Medium Risk (02/08/2024)   Depression (PHQ2-9)    PHQ-2 Score: 6  Alcohol Screen: Not on file  Housing: Unknown (08/19/2023)   Housing Stability Vital Sign    Unable to Pay for Housing in the Last Year: No    Number of Times Moved in the Last Year: Not on file    Homeless in the Last Year: No  Utilities: Not At Risk (08/19/2023)   AHC Utilities    Threatened with loss of utilities: No  Health Literacy: Not on file    Allergies: Allergies[1]  Metabolic Disorder Labs: Lab Results  Component Value Date   HGBA1C 5.7 (H) 05/06/2020   MPG 116.89 05/06/2020   MPG 117 12/21/2017   Lab Results  Component Value Date   PROLACTIN 1.3 (L) 05/06/2020   PROLACTIN 1.2 (L) 07/12/2017   Lab Results  Component Value Date   CHOL 193 05/06/2020   TRIG 103 05/06/2020   HDL 56 05/06/2020   CHOLHDL 3.4 05/06/2020   VLDL 21 05/06/2020   LDLCALC 116 (H) 05/06/2020   LDLCALC 163 (H) 12/21/2017   Lab Results  Component Value Date   TSH 14.152 (H) 11/22/2023   TSH 2.250 06/17/2021    Therapeutic Level  Labs: No results found for: LITHIUM No results found for: VALPROATE No results found for: CBMZ  Current Medications: Current Outpatient Medications  Medication Sig Dispense Refill   albuterol  (VENTOLIN  HFA) 108 (90 Base) MCG/ACT inhaler 2 puffs Inhalation every 4-6 hrs for 30 days for shortness of breath or wheezing     alendronate (FOSAMAX) 70 MG tablet Take 70 mg by mouth once a week.     amLODipine  (NORVASC ) 10 MG tablet 1 tablet Orally Once a  day for 100 days for high blood pressure     atorvastatin  (LIPITOR) 20 MG tablet Take 20 mg by mouth at bedtime.     bacitracin ointment Apply topically daily.     buPROPion  (WELLBUTRIN  XL) 150 MG 24 hr tablet TAKE 1 TABLET BY MOUTH DAILY ALONG WITH 300 MG TABLET FOR TOTAL OF 450 MG DAILY 90 tablet 1   buPROPion  (WELLBUTRIN  XL) 300 MG 24 hr tablet Take 1 tablet (300 mg total) by mouth daily. Take along with 150 mg daily , total of 450 mg daily 90 tablet 0   Calcium  Carb-Cholecalciferol 500-10 MG-MCG TABS Take 1 tablet by mouth.     calcium -vitamin D  (OSCAL WITH D) 500-200 MG-UNIT tablet Take 1 tablet by mouth daily with breakfast.     clonazePAM  (KLONOPIN ) 0.5 MG tablet TAKE 1 TABLET BY MOUTH DAILY AS NEEDED FOR ANXIETY. PLEASE USE IT ONLY FOR SEVERE PANIC ATTACKS. DO NOT COMBINE WITH OXYCODONE . 15 tablet 1   dexamethasone  (DECADRON ) 4 MG tablet Take 2 tablets daily x 3 days starting the day after chemotherapy. Take with food. 30 tablet 1   fluticasone  (FLONASE ) 50 MCG/ACT nasal spray Place into both nostrils as needed for allergies or rhinitis. (Patient not taking: Reported on 02/21/2024)     furosemide  (LASIX ) 20 MG tablet 1 tablet Orally Once a day for 100 days high blood pressure     hydrOXYzine  (ATARAX ) 10 MG tablet Take 2 tablets (20 mg total) by mouth at bedtime as needed. for sleep and anxiety 180 tablet 1   Lactulose 20 GM/30ML SOLN 15 mL Orally twice a day for 30 days as needed for constipation (Patient not taking: Reported on  02/21/2024)     letrozole  (FEMARA ) 2.5 MG tablet TAKE 1 TABLET BY MOUTH DAILY 90 tablet 3   levocetirizine (XYZAL) 5 MG tablet 1 tablet in the evening Orally Once a day for 100 days as needed for allergy symptoms (Patient not taking: Reported on 02/21/2024)     levothyroxine  (SYNTHROID , LEVOTHROID) 112 MCG tablet      lidocaine -prilocaine  (EMLA ) cream Apply 1 Application topically as needed. Apply a small amount to port site 1 hour before chemotherapy (Patient not taking: Reported on 02/21/2024) 30 g 2   OLANZapine  (ZYPREXA ) 5 MG tablet Take 1 tablet (5 mg total) by mouth at bedtime. 90 tablet 0   olopatadine  (PATANOL) 0.1 % ophthalmic solution Place 1 drop into both eyes 2 (two) times daily. (Patient not taking: Reported on 02/21/2024) 5 mL 1   omeprazole (PRILOSEC) 40 MG capsule Take 40 mg by mouth daily.      ondansetron  (ZOFRAN ) 8 MG tablet Take 1 tablet (8 mg total) by mouth every 8hrs as needed for nausea or vomiting. Start on the third day after cisplatin . (Patient not taking: Reported on 02/21/2024) 30 tablet 1   oxyCODONE  (OXY IR/ROXICODONE ) 5 MG immediate release tablet Take 0.5-1 tablets (2.5-5 mg total) by mouth every 8 (eight) hours as needed for severe pain (pain score 7-10) (for pain unrelieved by tylenol ). (Patient not taking: Reported on 02/21/2024) 30 tablet 0   potassium chloride  SA (KLOR-CON  M) 20 MEQ tablet Take 1 tablet (20 mEq total) by mouth daily. For low potassium (Patient not taking: Reported on 02/21/2024) 30 tablet 0   prazosin  (MINIPRESS ) 2 MG capsule Take 1 capsule (2 mg total) by mouth at bedtime. 90 capsule 3   prochlorperazine  (COMPAZINE ) 10 MG tablet Take 1 tablet (10 mg total) by mouth every 6 (six) hours as needed  for nausea or vomiting. (Patient not taking: Reported on 03/15/2024) 30 tablet 1   silver  sulfADIAZINE  (SILVADENE ) 1 % cream Apply 1 Application topically daily. 50 g 0   SPIRIVA  HANDIHALER 18 MCG CAPS Irrigate with 1 capsule as directed daily.      tiotropium (SPIRIVA ) 18 MCG inhalation capsule Place 18 mcg into inhaler and inhale daily.     traZODone  (DESYREL ) 50 MG tablet TAKE 1 TABLET BY MOUTH AT BEDTIME 30 tablet 0   No current facility-administered medications for this visit.     Musculoskeletal: Strength & Muscle Tone: within normal limits Gait & Station: normal Patient leans: N/A  Psychiatric Specialty Exam: Review of Systems  Psychiatric/Behavioral: Negative.      Blood pressure (!) 162/89, pulse 87, temperature (!) 97.5 F (36.4 C), temperature source Temporal, height 5' 1 (1.549 m), weight 157 lb 9.6 oz (71.5 kg).Body mass index is 29.78 kg/m.  General Appearance: Fairly Groomed  Eye Contact:  Fair  Speech:  Clear and Coherent  Volume:  Normal  Mood:  Euthymic  Affect:  Congruent  Thought Process:  Goal Directed and Descriptions of Associations: Intact  Orientation:  Full (Time, Place, and Person)  Thought Content: Paranoid Ideation chronic , able to cope   Suicidal Thoughts:  No  Homicidal Thoughts:  No  Memory:  Immediate;   Fair Recent;   Fair Remote;   Fair  Judgement:  Fair  Insight:  Fair  Psychomotor Activity:  Normal  Concentration:  Concentration: Fair and Attention Span: Fair  Recall:  [employer] of Knowledge: Fair  Language: Fair  Akathisia:  No  Handed:  Right  AIMS (if indicated): done  Assets:  Communication Skills Desire for Improvement Housing Social Support Transportation  ADL's:  Intact  Cognition: WNL  Sleep:  Fair   Screenings: Geneticist, Molecular Office Visit from 01/18/2024 in [facility] Office Visit from 09/28/2023 in [facility] Office Visit from 09/09/2023 in [facility] Office Visit from 06/01/2023 in [facility] Office Visit from 04/15/2023 in [facility]  AIMS Total  Score 0 0 0 0 0   AUDIT    Flowsheet Row Admission (Discharged) from 12/22/2017 in [facility]  Alcohol Use Disorder Identification Test Final Score (AUDIT) 1   GAD-7    Flowsheet Row Office Visit from 02/08/2024 in [facility] Office Visit from 01/18/2024 in [facility] Office Visit from 06/01/2023 in [facility] Office Visit from 04/15/2023 in [facility] Office Visit from 01/13/2023 in [facility]  Total GAD-7 Score 10 5 4 2 2    PHQ2-9    Flowsheet Row Office Visit from 03/15/2024 in [facility] Of [place] [facility] Follow Up  from 02/21/2024 in [facility] [place] Office Visit from 02/08/2024 in [facility] Office Visit from 01/18/2024 in [facility] Infusion from 01/11/2024 in [facility] Dept Of [place]  PHQ-2 Total Score 1 0 1 2 0  PHQ-9 Total Score -- -- 6 6 --   Flowsheet Row Office Visit from 02/08/2024 in [facility] ED from 01/19/2024 in  [facility] Department at [facility] Visit from 01/18/2024 in [facility]  C-SSRS RISK CATEGORY No Risk No Risk Moderate Risk     Assessment and Plan: Patricia Good is a 63 year old Caucasian female who presented for a follow-up appointment, discussed assessment and plan as noted below.  1. Schizoaffective disorder, bipolar type (HCC)-improving Currently denies any significant delusions or hallucinations although does have chronic paranoia, able to cope better. Continue Olanzapine  5 mg daily Continue Wellbutrin  450 mg daily  2. PTSD  (post-traumatic stress disorder)-stable Currently denies any intrusive memories flashbacks or nightmares Continue Prazosin  2 mg at bedtime Continue Trazodone  50 mg at bedtime  3. Bereavement-improving Currently denies any concerns  4. Tobacco use disorder-unstable Continues to smoke cigarettes.  Not interested in quitting  5. Social anxiety disorder-improving Currently reports anxiety symptoms is improving Continue Hydroxyzine  10 to 20 mg at bedtime as needed Continue Clonazepam  0.5 mg as needed for severe panic attacks only. Reviewed [place] PMP AWARxE  I have referred patient to [facility] behavioral health services for higher level of care.  Patient to reach out to them.   Collaboration of Care: Collaboration of Care: Other referred patient to [facility] behavioral health services for higher level of care.  Patient/Guardian was advised Release of Information must be obtained prior to any record release in order to collaborate their care with an outside provider. Patient/Guardian was advised if they have not already done so to contact the registration department to sign all necessary forms in order for us  to release information regarding their care.   Consent: Patient/Guardian gives verbal consent for treatment and assignment of benefits for services provided during this visit. Patient/Guardian expressed understanding and agreed to proceed.   This note was generated in part or whole with voice recognition software. Voice recognition is usually quite accurate but there are transcription errors that can and very often do occur. I apologize for any typographical errors that were not detected and corrected.    Maecie Sevcik, MD 03/23/2024, 3:36 PM     [1]  Allergies Allergen Reactions   Shellfish Allergy Anaphylaxis    Only to crab meat (non-imitation), not shrimp   Sulfasalazine Anaphylaxis and Hives    Other reaction(s): Other (See Comments)   Lisinopril     unknown

## 2024-03-24 ENCOUNTER — Ambulatory Visit: Payer: Self-pay | Admitting: Oncology

## 2024-03-24 ENCOUNTER — Inpatient Hospital Stay

## 2024-03-24 ENCOUNTER — Inpatient Hospital Stay: Admitting: Oncology

## 2024-03-24 ENCOUNTER — Encounter: Payer: Self-pay | Admitting: Psychiatry

## 2024-03-24 ENCOUNTER — Encounter: Payer: Self-pay | Admitting: Oncology

## 2024-03-24 ENCOUNTER — Inpatient Hospital Stay: Admitting: Nurse Practitioner

## 2024-03-24 VITALS — BP 126/64 | HR 82 | Temp 98.1°F | Resp 19 | Ht 61.0 in | Wt 156.3 lb

## 2024-03-24 DIAGNOSIS — Z5189 Encounter for other specified aftercare: Secondary | ICD-10-CM | POA: Diagnosis not present

## 2024-03-24 DIAGNOSIS — Z8544 Personal history of malignant neoplasm of other female genital organs: Secondary | ICD-10-CM | POA: Diagnosis not present

## 2024-03-24 DIAGNOSIS — N179 Acute kidney failure, unspecified: Secondary | ICD-10-CM | POA: Diagnosis not present

## 2024-03-24 DIAGNOSIS — T451X5A Adverse effect of antineoplastic and immunosuppressive drugs, initial encounter: Secondary | ICD-10-CM

## 2024-03-24 DIAGNOSIS — Z5902 Unsheltered homelessness: Secondary | ICD-10-CM

## 2024-03-24 DIAGNOSIS — E876 Hypokalemia: Secondary | ICD-10-CM

## 2024-03-24 DIAGNOSIS — Z08 Encounter for follow-up examination after completed treatment for malignant neoplasm: Secondary | ICD-10-CM | POA: Diagnosis not present

## 2024-03-24 DIAGNOSIS — C519 Malignant neoplasm of vulva, unspecified: Secondary | ICD-10-CM | POA: Diagnosis not present

## 2024-03-24 LAB — CBC WITH DIFFERENTIAL (CANCER CENTER ONLY)
Abs Immature Granulocytes: 0.01 K/uL (ref 0.00–0.07)
Basophils Absolute: 0 K/uL (ref 0.0–0.1)
Basophils Relative: 1 %
Eosinophils Absolute: 0.2 K/uL (ref 0.0–0.5)
Eosinophils Relative: 6 %
HCT: 31.3 % — ABNORMAL LOW (ref 36.0–46.0)
Hemoglobin: 10.9 g/dL — ABNORMAL LOW (ref 12.0–15.0)
Immature Granulocytes: 0 %
Lymphocytes Relative: 17 %
Lymphs Abs: 0.7 K/uL (ref 0.7–4.0)
MCH: 40.2 pg — ABNORMAL HIGH (ref 26.0–34.0)
MCHC: 34.8 g/dL (ref 30.0–36.0)
MCV: 115.5 fL — ABNORMAL HIGH (ref 80.0–100.0)
Monocytes Absolute: 0.6 K/uL (ref 0.1–1.0)
Monocytes Relative: 14 %
Neutro Abs: 2.5 K/uL (ref 1.7–7.7)
Neutrophils Relative %: 62 %
Platelet Count: 107 K/uL — ABNORMAL LOW (ref 150–400)
RBC: 2.71 MIL/uL — ABNORMAL LOW (ref 3.87–5.11)
RDW: 11.9 % (ref 11.5–15.5)
WBC Count: 4 K/uL (ref 4.0–10.5)
nRBC: 0 % (ref 0.0–0.2)

## 2024-03-24 LAB — CMP (CANCER CENTER ONLY)
ALT: 31 U/L (ref 0–44)
AST: 37 U/L (ref 15–41)
Albumin: 4 g/dL (ref 3.5–5.0)
Alkaline Phosphatase: 127 U/L — ABNORMAL HIGH (ref 38–126)
Anion gap: 11 (ref 5–15)
BUN: 9 mg/dL (ref 8–23)
CO2: 33 mmol/L — ABNORMAL HIGH (ref 22–32)
Calcium: 9.5 mg/dL (ref 8.9–10.3)
Chloride: 93 mmol/L — ABNORMAL LOW (ref 98–111)
Creatinine: 1.71 mg/dL — ABNORMAL HIGH (ref 0.44–1.00)
GFR, Estimated: 33 mL/min — ABNORMAL LOW
Glucose, Bld: 134 mg/dL — ABNORMAL HIGH (ref 70–99)
Potassium: 3.3 mmol/L — ABNORMAL LOW (ref 3.5–5.1)
Sodium: 136 mmol/L (ref 135–145)
Total Bilirubin: 0.4 mg/dL (ref 0.0–1.2)
Total Protein: 7.2 g/dL (ref 6.5–8.1)

## 2024-03-24 MED ORDER — SODIUM CHLORIDE 0.9 % IV SOLN
Freq: Once | INTRAVENOUS | Status: AC
  Filled 2024-03-24: qty 250

## 2024-03-24 MED ORDER — POTASSIUM CHLORIDE 10 MEQ/100ML IV SOLN
10.0000 meq | Freq: Once | INTRAVENOUS | Status: AC
  Administered 2024-03-24: 10 meq via INTRAVENOUS
  Filled 2024-03-24: qty 100

## 2024-03-24 NOTE — Progress Notes (Signed)
 "    Hematology/Oncology Consult note [facility]  [phone] [phone]  Patient Care Team: Entzminger, Ethridge LABOR, MD as PCP - General (Internal Medicine) [name] Shasta POUR, RN as Oncology Nurse Navigator Maurie Rayfield BIRCH, RN as Oncology Nurse Navigator [name], MD as Consulting Physician (Oncology) Lenn Aran, MD as Consulting Physician (Radiation Oncology)   Name of the patient: Patricia Good  [id number]  Sep 09, 1960   Date of visit: 03/24/2024  Diagnosis- 1.  History of breast cancer in remission 2.  Locally advanced vulvar carcinoma  Chief complaint/ Reason for visit-routine follow-up visit for vulvar cancer  Heme/Onc history:  Patient is a 63 year old female with history of stage I left breast cancer ER/PR positive HER2 negative.  She underwent lumpectomy and sentinel lymph node biopsy.  She declined adjuvant chemotherapy despite her recurrence score of 28.  She also stopped radiation therapy prematurely.She is presently on letrozole  for endocrine therapy.  She has baseline osteoporosis for which she is on Fosamax.   She was seen by infectious disease for herpetic like lesions but overall lesions were concerning for malignancy and patient was seen by Dr. Lovetta April 2025.  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    PET CT scan on 09/10/2023 showed: 1. Superficial right vulvar lesion measuring up to 3.3 cm in length with maximum SUV 13.6, compatible with malignancy. 2. Small amount of accentuated activity along the right upper medial thigh just below the crease with the pubis, with associated soft tissue density measuring about 0.7 by 1.7 cm, with maximum SUV 7.7, concerning for malignancy. 3.  Right inguinal and right external iliac lymph nodes with maximum above blood pool, suspicious for metastatic disease.   Plan is for weekly cisplatin  for concurrent chemoradiation    Interval history- Discussed the use of AI scribe software for clinical note transcription with the patient, who gave verbal consent to proceed.  History of Present Illness   Patricia Good is a 62 year old female with previously treated malignant neoplasm who presents for oncology follow-up and evaluation of a healing thigh wound.  Recent PET scan results show shrinkage of the previously treated area and no new areas of cancer, as discussed with the patient. She has completed radiation therapy to the primary site.  She has a healing wound on her thigh, previously biopsied and negative for malignancy. She reports improvement in the wound.  She is experiencing dehydration. She manages hydration by purchasing bottled water  and storing it in her vehicle.  She is currently living in her vehicle, managing in cold weather, and obtains food that does not require cooking. She prefers to rely on her own finances.     ECOG PS- 1 Pain scale- 0 Opioid associated constipation- no  Review of systems- Review of Systems  Constitutional:  Positive for malaise/fatigue and weight loss. Negative for chills and fever.  HENT:  Negative for congestion, ear discharge and nosebleeds.   Eyes:  Negative for blurred vision.  Respiratory:  Negative for cough, hemoptysis, sputum production, shortness of breath and wheezing.   Cardiovascular:  Negative for chest pain, palpitations, orthopnea and claudication.  Gastrointestinal:  Negative for abdominal pain, blood in stool, constipation, diarrhea, heartburn, melena, nausea and vomiting.  Genitourinary:  Negative for dysuria, flank pain, frequency, hematuria and urgency.  Musculoskeletal:  Negative for back pain,  joint pain and myalgias.  Skin:  Negative for rash.  Neurological:  Negative  for dizziness, tingling, focal weakness, seizures, weakness and headaches.  Endo/Heme/Allergies:  Does not bruise/bleed easily.  Psychiatric/Behavioral:  Negative for depression and suicidal ideas. The patient does not have insomnia.       Allergies[1]   Past Medical History:  Diagnosis Date   Abnormal thyroid  stimulating hormone (TSH) level    Anemia    Anxiety    Asthma    Bipolar 1 disorder (HCC)    COPD (chronic obstructive pulmonary disease) (HCC)    Endometriosis    GERD (gastroesophageal reflux disease)    Hepatitis A    Hypertension    Hypothyroidism    Malignant neoplasm of upper-inner quadrant of left breast in female, 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   Personal history of radiation therapy    Pre-diabetes    PTSD (post-traumatic stress disorder)    Schizoaffective disorder (HCC)      Past Surgical History:  Procedure Laterality Date   BREAST BIOPSY Left 03/03/2022   Left breast stereo bx Ribbon Clip- path pending   BREAST BIOPSY Left 03/03/2022   MM LT BREAST BX W LOC DEV 1ST LESION IMAGE BX SPEC STEREO GUIDE 03/03/2022 [facility]   BREAST LUMPECTOMY Left 03/20/2022   ECTOPIC PREGNANCY SURGERY     IR IMAGING GUIDED PORT INSERTION  11/11/2023   PART MASTECTOMY,RADIO FREQUENCY LOCALIZER,AXILLARY SENTINEL NODE BIOPSY Left 03/20/2022   Procedure: PART MASTECTOMY,RADIO FREQUENCY LOCALIZER,AXILLARY SENTINEL NODE BIOPSY;  Surgeon: Rodolph Romano, MD;  Location: [facility];  Service: General;  Laterality: Left;   TOTAL ABDOMINAL HYSTERECTOMY W/ BILATERAL SALPINGOOPHORECTOMY  1990    Social History   Socioeconomic History   Marital status: Single    Spouse name: Not on file   Number of children: 0   Years of education: Not on file   Highest education level: Associate degree: occupational, scientist, product/process development, or vocational program  Occupational History   Not on file  Tobacco Use   Smoking status: Every Day    Current packs/day: 0.50    Average  packs/day: 0.5 packs/day for 55.2 years (27.6 ttl pk-yrs)    Types: Cigarettes    Start date: 01/22/1969   Smokeless tobacco: Never   Tobacco comments:    Continues to go down on number per day to 5-7 per day. Tried Chantix  but stated after several weeks not helping  Vaping Use   Vaping status: Never Used  Substance and Sexual Activity   Alcohol use: Not Currently    Alcohol/week: 1.0 - 2.0 standard drink of alcohol    Types: 1 Glasses of wine per week    Comment: occasional   Drug use: No   Sexual activity: Not Currently    Birth control/protection: None  Other Topics Concern   Not on file  Social History Narrative   Lives alone   Social Drivers of Health   Tobacco Use: High Risk (03/24/2024)   Patient History    Smoking Tobacco Use: Every Day    Smokeless Tobacco Use: Never    Passive Exposure: Not on file  Financial Resource Strain: Medium Risk (08/03/2023)   Received from [facility]   Overall Financial Resource Strain (CARDIA)    Difficulty of Paying Living Expenses: Somewhat hard  Food Insecurity: Food Insecurity Present (11/15/2023)   Epic    Worried About Programme Researcher, Broadcasting/film/video in the Last Year: Sometimes true    [employer] of Food in the Last Year:  Sometimes true  Transportation Needs: No Transportation Needs (08/19/2023)   PRAPARE - Administrator, Civil Service (Medical): No    Lack of Transportation (Non-Medical): No  Physical Activity: Not on file  Stress: Not on file  Social Connections: Not on file  Intimate Partner Violence: Not At Risk (08/19/2023)   Humiliation, Afraid, Rape, and Kick questionnaire    Fear of Current or Ex-Partner: No    Emotionally Abused: No    Physically Abused: No    Sexually Abused: No  Depression (PHQ2-9): Low Risk (03/24/2024)   Depression (PHQ2-9)    PHQ-2 Score: 0  Recent Concern: Depression (PHQ2-9) - Medium Risk (03/23/2024)   Depression (PHQ2-9)    PHQ-2 Score: 5  Alcohol Screen: Not on file   Housing: Unknown (08/19/2023)   Housing Stability Vital Sign    Unable to Pay for Housing in the Last Year: No    Number of Times Moved in the Last Year: Not on file    Homeless in the Last Year: No  Utilities: Not At Risk (08/19/2023)   [employer] Utilities    Threatened with loss of utilities: No  Health Literacy: Not on file    Family History  Problem Relation Age of Onset   Hypertension Father    Diabetes Father    Heart disease Father    Blindness Father    Alcohol abuse Sister    Drug abuse Sister    Anxiety disorder Sister    Depression Sister    Obesity Brother    Arthritis Brother    Alcohol abuse Brother    Drug abuse Brother    Depression Brother    Breast cancer Cousin    Clotting disorder Neg Hx     Current Medications[2]  Physical exam:  Vitals:   03/24/24 1037  BP: 126/64  Pulse: 82  Resp: 19  Temp: 98.1 F (36.7 C)  TempSrc: Tympanic  SpO2: 96%  Weight: 156 lb 4.8 oz (70.9 kg)  Height: 5' 1 (1.549 m)   Physical Exam Cardiovascular:     Rate and Rhythm: Normal rate and regular rhythm.     Heart sounds: Normal heart sounds.  Pulmonary:     Effort: Pulmonary effort is normal.     Breath sounds: Normal breath sounds.  Abdominal:     General: Bowel sounds are normal.     Palpations: Abdomen is soft.     Comments: Healing oval wound noted over the medial aspect of the right thigh.  There is another area of ulceration noted in the vulva at the site of her vulvar mass which is now postradiation and healing well  Skin:    General: Skin is warm and dry.  Neurological:     Mental Status: She is alert and oriented to person, place, and time.      I have personally reviewed labs listed below:    Latest Ref Rng & Units 03/24/2024   10:05 AM  CMP  Glucose 70 - 99 mg/dL 865   BUN 8 - 23 mg/dL 9   Creatinine 9.55 - 8.99 mg/dL 8.28   Sodium 864 - 854 mmol/L 136   Potassium 3.5 - 5.1 mmol/L 3.3   Chloride 98 - 111 mmol/L 93   CO2 22 - 32 mmol/L 33    Calcium  8.9 - 10.3 mg/dL 9.5   Total Protein 6.5 - 8.1 g/dL 7.2   Total Bilirubin 0.0 - 1.2 mg/dL 0.4   Alkaline Phos 38 - 126 U/L  127   AST 15 - 41 U/L 37   ALT 0 - 44 U/L 31       Latest Ref Rng & Units 03/24/2024   10:05 AM  CBC  WBC 4.0 - 10.5 K/uL 4.0   Hemoglobin 12.0 - 15.0 g/dL 89.0   Hematocrit 63.9 - 46.0 % 31.3   Platelets 150 - 400 K/uL 107    I have personally reviewed Radiology images listed below: No images are attached to the encounter.  NM PET Image Restag (PS) Skull Base To Thigh Result Date: 03/11/2024 CLINICAL DATA:  Subsequent treatment strategy for vulvar cancer. EXAM: NUCLEAR MEDICINE PET SKULL BASE TO THIGH TECHNIQUE: 8.9 mCi F-18 FDG was injected intravenously. Full-ring PET imaging was performed from the skull base to thigh after the radiotracer. CT data was obtained and used for attenuation correction and anatomic localization. Fasting blood glucose: 118 mg/dl COMPARISON:  93/93/7974 FINDINGS: Mediastinal blood pool activity: SUV max 3.0 Liver activity: SUV max NA NECK: No hypermetabolic lymph nodes in the neck. Numerous foci of FDG uptake are identified in the neck, mainly posteriorly likely as a combination of muscular uptake and hypermetabolic brown fat. Incidental CT findings: None. CHEST: Focal uptake is identified in the right apex at the first rib. No underlying rib lesion, pleural lesion, or lung lesion and this may represent hypermetabolic brown fat. No hypermetabolic mediastinal or hilar lymphadenopathy Small mildly hypermetabolic but asymmetric focus of activity identified in the superior left breast with SUV max = 2.5. No discrete underlying soft tissue lesion evident on CT imaging. Incidental CT findings: Coronary artery calcification is evident. Right Port-A-Cath tip is positioned in the right atrium near the junction with the IVC. Mild atherosclerotic calcification is noted in the wall of the thoracic aorta. Diffuse micro nodularity in the lung bases is  progressive in the interval. This was present on an abdomen CT of 11/03/2011 compatible with chronic benign etiology. ABDOMEN/PELVIS: Persistent hypermetabolic right bulbar lesion with SUV max = 9.4 today, decreased from 13.6 previously. Focal hypermetabolism identified previously along the right upper medial thigh just below the crease with the pubis is again noted with SUV max = 6.5 today compared to 7.7 previously. Hypermetabolic right groin node seen previously have decreased in the interval with SUV max = 1.3 today compared to 4.8 previously. No new hypermetabolic lymphadenopathy in the abdomen or pelvis. Incidental CT findings: Tiny gallstones evident (81/6). Stable bilateral adrenal gland thickening with no change in the 15 mm left adrenal nodule showing low density consistent with adenoma. There is moderate atherosclerotic calcification of the abdominal aorta without aneurysm. SKELETON: No focal hypermetabolic activity to suggest skeletal metastasis. Incidental CT findings: None. IMPRESSION: 1. Interval decrease in hypermetabolism associated with the right vulvar lesion, right medial thigh lesion and right groin lymph nodes. 2. No new or progressive hypermetabolic disease in the neck, chest, abdomen, or pelvis. 3. Small mildly hypermetabolic but asymmetric focus of activity in the superior left breast, as before. No discrete underlying soft tissue lesion evident on CT imaging. Correlation with mammography recommended. 4. Cholelithiasis. 5. Stable left adrenal adenoma. 6.  Aortic Atherosclerosis (ICD10-I70.0). Electronically Signed   By: Camellia Candle M.D.   On: 03/11/2024 09:41     Assessment and plan- Patient is a 63 y.o. 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  She is s/p concurrent chemoradiation with weekly cisplatin  and here to discuss PET scan results and further management  Assessment and  Plan    Vulvar cancer s/p chemoRadiation:  I  have reviewed PET CT scan images independently and discussed findings with the patient which overall shows decrease in hypermetabolism with the right vulvar lesion as well as right groin lymph nodes.  She was also noted to have a right medial thigh lesion which was biopsied and was negative for malignancy. I will see her back in 3 months with labs.  AKI/dehydration Dehydration likely due to inadequate oral intake with renal involvement from hypovolemia. - Administered intravenous fluids during today's visit. - Discussed the importance of maintaining adequate oral hydration.  Healing thigh wound secondary to prior infection Healing thigh wound with biopsy negative for malignancy, attributed to localized infection with improvement noted. - Arranged wound assessment by Tinnie. - Reviewed biopsy results confirming infection as the etiology and absence of malignancy.         Visit Diagnosis 1. AKI (acute kidney injury)   2. Encounter for follow-up surveillance of vulvar cancer      Dr. Annah Skene, MD, MPH [facility] [phone] 03/24/2024 1:00 PM                   [1]  Allergies Allergen Reactions   Shellfish Allergy Anaphylaxis    Only to crab meat (non-imitation), not shrimp   Sulfasalazine Anaphylaxis and Hives    Other reaction(s): Other (See Comments)   Lisinopril     unknown  [2]  Current Outpatient Medications:    albuterol  (VENTOLIN  HFA) 108 (90 Base) MCG/ACT inhaler, 2 puffs Inhalation every 4-6 hrs for 30 days for shortness of breath or wheezing, Disp: , Rfl:    alendronate (FOSAMAX) 70 MG tablet, Take 70 mg by mouth once a week., Disp: , Rfl:    amLODipine  (NORVASC ) 5 MG tablet, Take 5 mg by mouth daily., Disp: , Rfl:    atorvastatin  (LIPITOR) 20 MG tablet, Take 20 mg by mouth at bedtime., Disp: , Rfl:    bacitracin ointment, Apply topically daily., Disp: , Rfl:    buPROPion  (WELLBUTRIN  XL) 150 MG 24 hr tablet, TAKE 1 TABLET  BY MOUTH DAILY ALONG WITH 300 MG TABLET FOR TOTAL OF 450 MG DAILY, Disp: 90 tablet, Rfl: 1   buPROPion  (WELLBUTRIN  XL) 300 MG 24 hr tablet, Take 1 tablet (300 mg total) by mouth daily. Take along with 150 mg daily , total of 450 mg daily, Disp: 90 tablet, Rfl: 0   calcium -vitamin D  (OSCAL WITH D) 500-200 MG-UNIT tablet, Take 1 tablet by mouth daily with breakfast., Disp: , Rfl:    clonazePAM  (KLONOPIN ) 0.5 MG tablet, TAKE 1 TABLET BY MOUTH DAILY AS NEEDED FOR ANXIETY. PLEASE USE IT ONLY FOR SEVERE PANIC ATTACKS. DO NOT COMBINE WITH OXYCODONE ., Disp: 15 tablet, Rfl: 1   furosemide  (LASIX ) 20 MG tablet, 1 tablet Orally Once a day for 100 days high blood pressure, Disp: , Rfl:    letrozole  (FEMARA ) 2.5 MG tablet, TAKE 1 TABLET BY MOUTH DAILY, Disp: 90 tablet, Rfl: 3   levothyroxine  (SYNTHROID , LEVOTHROID) 112 MCG tablet, , Disp: , Rfl:    omeprazole (PRILOSEC) 40 MG capsule, Take 40 mg by mouth daily. , Disp: , Rfl:    prazosin  (MINIPRESS ) 2 MG capsule, Take 1 capsule (2 mg total) by mouth at bedtime., Disp: 90 capsule, Rfl: 3   silver  sulfADIAZINE  (SILVADENE ) 1 % cream, Apply 1 Application topically daily., Disp: 50 g, Rfl: 0   tiotropium (SPIRIVA ) 18 MCG inhalation capsule, Place 18 mcg into  inhaler and inhale daily., Disp: , Rfl:    traZODone  (DESYREL ) 50 MG tablet, TAKE 1 TABLET BY MOUTH AT BEDTIME, Disp: 30 tablet, Rfl: 0   amLODipine  (NORVASC ) 10 MG tablet, 1 tablet Orally Once a day for 100 days for high blood pressure (Patient not taking: Reported on 03/24/2024), Disp: , Rfl:    Calcium  Carb-Cholecalciferol 500-10 MG-MCG TABS, Take 1 tablet by mouth., Disp: , Rfl:    dexamethasone  (DECADRON ) 4 MG tablet, Take 2 tablets daily x 3 days starting the day after chemotherapy. Take with food., Disp: 30 tablet, Rfl: 1   fluticasone  (FLONASE ) 50 MCG/ACT nasal spray, Place into both nostrils as needed for allergies or rhinitis. (Patient not taking: Reported on 02/21/2024), Disp: , Rfl:    hydrOXYzine   (ATARAX ) 10 MG tablet, Take 2 tablets (20 mg total) by mouth at bedtime as needed. for sleep and anxiety, Disp: 180 tablet, Rfl: 1   Lactulose 20 GM/30ML SOLN, 15 mL Orally twice a day for 30 days as needed for constipation (Patient not taking: Reported on 02/21/2024), Disp: , Rfl:    levocetirizine (XYZAL) 5 MG tablet, 1 tablet in the evening Orally Once a day for 100 days as needed for allergy symptoms (Patient not taking: Reported on 02/21/2024), Disp: , Rfl:    lidocaine -prilocaine  (EMLA ) cream, Apply 1 Application topically as needed. Apply a small amount to port site 1 hour before chemotherapy (Patient not taking: Reported on 02/21/2024), Disp: 30 g, Rfl: 2   OLANZapine  (ZYPREXA ) 5 MG tablet, Take 1 tablet (5 mg total) by mouth at bedtime., Disp: 90 tablet, Rfl: 0   olopatadine  (PATANOL) 0.1 % ophthalmic solution, Place 1 drop into both eyes 2 (two) times daily. (Patient not taking: Reported on 02/21/2024), Disp: 5 mL, Rfl: 1   ondansetron  (ZOFRAN ) 8 MG tablet, Take 1 tablet (8 mg total) by mouth every 8hrs as needed for nausea or vomiting. Start on the third day after cisplatin . (Patient not taking: Reported on 02/21/2024), Disp: 30 tablet, Rfl: 1   oxyCODONE  (OXY IR/ROXICODONE ) 5 MG immediate release tablet, Take 0.5-1 tablets (2.5-5 mg total) by mouth every 8 (eight) hours as needed for severe pain (pain score 7-10) (for pain unrelieved by tylenol ). (Patient not taking: Reported on 02/21/2024), Disp: 30 tablet, Rfl: 0   potassium chloride  SA (KLOR-CON  M) 20 MEQ tablet, Take 1 tablet (20 mEq total) by mouth daily. For low potassium (Patient not taking: Reported on 02/21/2024), Disp: 30 tablet, Rfl: 0   prochlorperazine  (COMPAZINE ) 10 MG tablet, Take 1 tablet (10 mg total) by mouth every 6 (six) hours as needed for nausea or vomiting. (Patient not taking: Reported on 03/15/2024), Disp: 30 tablet, Rfl: 1   SPIRIVA  HANDIHALER 18 MCG CAPS, Irrigate with 1 capsule as directed daily., Disp: , Rfl:  No  current facility-administered medications for this visit.  Facility-Administered Medications Ordered in Other Visits:    potassium chloride  10 mEq in 100 mL IVPB, 10 mEq, Intravenous, Once, [name], MD, Last Rate: 100 mL/hr at 03/24/24 1201, 10 mEq at 03/24/24 1201  "

## 2024-03-24 NOTE — Progress Notes (Signed)
 Patient would like to know when she can get her MAMMO, along with port removal.
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
## 2024-03-24 NOTE — Patient Instructions (Signed)
 Potassium Chloride Injection What is this medication? POTASSIUM CHLORIDE (poe TASS i um KLOOR ide) prevents and treats low levels of potassium in your body. Potassium plays an important role in maintaining the health of your kidneys, heart, muscles, and nervous system. This medicine may be used for other purposes; ask your health care provider or pharmacist if you have questions. COMMON BRAND NAME(S): PROAMP What should I tell my care team before I take this medication? They need to know if you have any of these conditions: Addison disease Dehydration Diabetes (high blood sugar) Heart disease High levels of potassium in the blood Irregular heartbeat or rhythm Kidney disease Large areas of burned skin An unusual or allergic reaction to potassium, other medications, foods, dyes, or preservatives Pregnant or trying to get pregnant Breast-feeding How should I use this medication? This medication is injected into a vein. It is given in a hospital or clinic setting. Talk to your care team about the use of this medication in children. Special care may be needed. Overdosage: If you think you have taken too much of this medicine contact a poison control center or emergency room at once. NOTE: This medicine is only for you. Do not share this medicine with others. What if I miss a dose? This does not apply. This medication is not for regular use. What may interact with this medication? Do not take this medication with any of the following: Certain diuretics, such as spironolactone, triamterene Eplerenone Sodium polystyrene sulfonate This medication may also interact with the following: Certain medications for blood pressure or heart disease, such as lisinopril, losartan, quinapril, valsartan Medications that lower your chance of fighting infection, such as cyclosporine, tacrolimus NSAIDs, medications for pain and inflammation, such as ibuprofen or naproxen Other potassium supplements Salt  substitutes This list may not describe all possible interactions. Give your health care provider a list of all the medicines, herbs, non-prescription drugs, or dietary supplements you use. Also tell them if you smoke, drink alcohol, or use illegal drugs. Some items may interact with your medicine. What should I watch for while using this medication? Visit your care team for regular checks on your progress. Tell your care team if your symptoms do not start to get better or if they get worse. You may need blood work while you are taking this medication. Avoid salt substitutes unless you are told otherwise by your care team. What side effects may I notice from receiving this medication? Side effects that you should report to your care team as soon as possible: Allergic reactions--skin rash, itching, hives, swelling of the face, lips, tongue, or throat High potassium level--muscle weakness, fast or irregular heartbeat Side effects that usually do not require medical attention (report to your care team if they continue or are bothersome): Diarrhea Nausea Stomach pain Vomiting This list may not describe all possible side effects. Call your doctor for medical advice about side effects. You may report side effects to FDA at 1-800-FDA-1088. Where should I keep my medication? This medication is given in a hospital or clinic. It will not be stored at home. NOTE: This sheet is a summary. It may not cover all possible information. If you have questions about this medicine, talk to your doctor, pharmacist, or health care provider.  2024 Elsevier/Gold Standard (2021-10-03 00:00:00)

## 2024-03-24 NOTE — Progress Notes (Signed)
 [facility] CSW Progress Note  Visual Merchandiser met with patient to follow-up on need for community resources.  Tinnie Dawn, NP, referred patient to CSW.    Interventions: Referred patient to community resources: [facility] for psychiatric medication management and counseling.  Patient stated she called them and was informed they do no not accept Medicaid.  CSW spoke with a representative at [facility], who confirmed they do accept Medicaid.  [facility] to contact patient to clarify and make an appointment.  Patient accepted a bag of food.  Offered information regarding [employer] and food bank, but she declined.  Patient would not inform CSW of her housing situation, but said she would find housing.  Patient expressed no other needs.       Follow Up Plan:  CSW will follow-up with patient by phone     Macario CHRISTELLA Au, LCSW Clinical Social Worker [facility]    Patient is participating in a Managed Medicaid Plan:  Yes

## 2024-03-27 ENCOUNTER — Encounter: Payer: Self-pay | Admitting: Oncology

## 2024-03-27 ENCOUNTER — Other Ambulatory Visit: Payer: Self-pay

## 2024-03-27 ENCOUNTER — Emergency Department: Admission: EM | Admit: 2024-03-27 | Discharge: 2024-03-28 | Disposition: A | Discharge status: Home / Self Care

## 2024-03-27 DIAGNOSIS — E876 Hypokalemia: Secondary | ICD-10-CM | POA: Insufficient documentation

## 2024-03-27 DIAGNOSIS — J449 Chronic obstructive pulmonary disease, unspecified: Secondary | ICD-10-CM | POA: Insufficient documentation

## 2024-03-27 DIAGNOSIS — E039 Hypothyroidism, unspecified: Secondary | ICD-10-CM | POA: Diagnosis not present

## 2024-03-27 DIAGNOSIS — F22 Delusional disorders: Secondary | ICD-10-CM | POA: Insufficient documentation

## 2024-03-27 DIAGNOSIS — F4312 Post-traumatic stress disorder, chronic: Secondary | ICD-10-CM | POA: Insufficient documentation

## 2024-03-27 DIAGNOSIS — I10 Essential (primary) hypertension: Secondary | ICD-10-CM | POA: Diagnosis not present

## 2024-03-27 DIAGNOSIS — Z79899 Other long term (current) drug therapy: Secondary | ICD-10-CM | POA: Insufficient documentation

## 2024-03-27 DIAGNOSIS — F250 Schizoaffective disorder, bipolar type: Secondary | ICD-10-CM | POA: Insufficient documentation

## 2024-03-27 DIAGNOSIS — F29 Unspecified psychosis not due to a substance or known physiological condition: Secondary | ICD-10-CM | POA: Diagnosis present

## 2024-03-27 DIAGNOSIS — Z853 Personal history of malignant neoplasm of breast: Secondary | ICD-10-CM | POA: Diagnosis not present

## 2024-03-27 LAB — COMPREHENSIVE METABOLIC PANEL WITH GFR
ALT: 30 U/L (ref 0–44)
AST: 37 U/L (ref 15–41)
Albumin: 4 g/dL (ref 3.5–5.0)
Alkaline Phosphatase: 137 U/L — ABNORMAL HIGH (ref 38–126)
Anion gap: 13 (ref 5–15)
BUN: 10 mg/dL (ref 8–23)
CO2: 30 mmol/L (ref 22–32)
Calcium: 9.4 mg/dL (ref 8.9–10.3)
Chloride: 91 mmol/L — ABNORMAL LOW (ref 98–111)
Creatinine, Ser: 1.6 mg/dL — ABNORMAL HIGH (ref 0.44–1.00)
GFR, Estimated: 35 mL/min — ABNORMAL LOW
Glucose, Bld: 103 mg/dL — ABNORMAL HIGH (ref 70–99)
Potassium: 2.8 mmol/L — ABNORMAL LOW (ref 3.5–5.1)
Sodium: 134 mmol/L — ABNORMAL LOW (ref 135–145)
Total Bilirubin: 0.5 mg/dL (ref 0.0–1.2)
Total Protein: 7.4 g/dL (ref 6.5–8.1)

## 2024-03-27 LAB — CBC
HCT: 32.4 % — ABNORMAL LOW (ref 36.0–46.0)
Hemoglobin: 11.3 g/dL — ABNORMAL LOW (ref 12.0–15.0)
MCH: 40.6 pg — ABNORMAL HIGH (ref 26.0–34.0)
MCHC: 34.9 g/dL (ref 30.0–36.0)
MCV: 116.5 fL — ABNORMAL HIGH (ref 80.0–100.0)
Platelets: 107 K/uL — ABNORMAL LOW (ref 150–400)
RBC: 2.78 MIL/uL — ABNORMAL LOW (ref 3.87–5.11)
RDW: 11.9 % (ref 11.5–15.5)
WBC: 5.1 K/uL (ref 4.0–10.5)
nRBC: 0 % (ref 0.0–0.2)

## 2024-03-27 LAB — ACETAMINOPHEN LEVEL: Acetaminophen (Tylenol), Serum: <10 ug/mL — ABNORMAL LOW (ref 10–30)

## 2024-03-27 LAB — SALICYLATE LEVEL: Salicylate Lvl: <7 mg/dL — ABNORMAL LOW (ref 7.0–30.0)

## 2024-03-27 LAB — ETHANOL: Alcohol, Ethyl (B): <15 mg/dL

## 2024-03-27 MED ORDER — POTASSIUM CHLORIDE CRYS ER 20 MEQ PO TBCR
40.0000 meq | EXTENDED_RELEASE_TABLET | Freq: Once | ORAL | Status: AC
  Administered 2024-03-27: 40 meq via ORAL
  Filled 2024-03-27: qty 2
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
## 2024-03-27 NOTE — ED Notes (Signed)
 Pt belongings:  Pink jacket Blue jeans White shirt Campbell soup Blue socks [employer] sunglasses Medication bag

## 2024-03-28 ENCOUNTER — Encounter: Payer: Self-pay | Admitting: Psychiatry

## 2024-03-28 DIAGNOSIS — F250 Schizoaffective disorder, bipolar type: Secondary | ICD-10-CM | POA: Diagnosis not present

## 2024-03-28 DIAGNOSIS — F4312 Post-traumatic stress disorder, chronic: Secondary | ICD-10-CM | POA: Diagnosis not present

## 2024-03-28 DIAGNOSIS — F22 Delusional disorders: Secondary | ICD-10-CM

## 2024-03-28 MED ORDER — ATORVASTATIN CALCIUM 20 MG PO TABS: 20.0000 mg | ORAL_TABLET | Freq: Every day | ORAL | Status: DC

## 2024-03-28 MED ORDER — HYDROXYZINE HCL 10 MG PO TABS: 20.0000 mg | ORAL_TABLET | Freq: Every evening | ORAL | Status: DC | PRN

## 2024-03-28 MED ORDER — BUPROPION HCL ER (XL) 150 MG PO TB24: 300.0000 mg | ORAL_TABLET | Freq: Every day | ORAL | Status: DC

## 2024-03-28 MED ORDER — LEVOTHYROXINE SODIUM 112 MCG PO TABS
112.0000 ug | ORAL_TABLET | Freq: Every day | ORAL | Status: DC
  Administered 2024-03-28: 112 ug via ORAL
  Filled 2024-03-28: qty 1

## 2024-03-28 MED ORDER — OLANZAPINE 5 MG PO TABS: 5.0000 mg | ORAL_TABLET | Freq: Every day | ORAL | Status: DC

## 2024-03-28 MED ORDER — PRAZOSIN HCL 2 MG PO CAPS: 2.0000 mg | ORAL_CAPSULE | Freq: Every day | ORAL | Status: DC

## 2024-03-28 MED ORDER — ALBUTEROL SULFATE HFA 108 (90 BASE) MCG/ACT IN AERS: 2.0000 | INHALATION_SPRAY | RESPIRATORY_TRACT | Status: DC | PRN

## 2024-03-28 MED ORDER — TIOTROPIUM BROMIDE 18 MCG IN CAPS: 1.0000 | ORAL_CAPSULE | Freq: Every day | RESPIRATORY_TRACT | Status: DC

## 2024-03-28 MED ORDER — BUPROPION HCL ER (XL) 150 MG PO TB24: 150.0000 mg | ORAL_TABLET | Freq: Every day | ORAL | Status: DC

## 2024-03-28 MED ORDER — TRAZODONE HCL 50 MG PO TABS: 50.0000 mg | ORAL_TABLET | Freq: Every day | ORAL | Status: DC

## 2024-03-28 MED ORDER — TIOTROPIUM BROMIDE MONOHYDRATE 18 MCG IN CAPS: 18.0000 ug | ORAL_CAPSULE | Freq: Every day | RESPIRATORY_TRACT | Status: DC

## 2024-03-28 MED ORDER — LETROZOLE 2.5 MG PO TABS
2.5000 mg | ORAL_TABLET | Freq: Every day | ORAL | Status: DC
  Filled 2024-03-28: qty 1

## 2024-03-28 MED ORDER — POTASSIUM CHLORIDE CRYS ER 20 MEQ PO TBCR: 20.0000 meq | EXTENDED_RELEASE_TABLET | Freq: Two times a day (BID) | ORAL | 0 refills | Status: AC

## 2024-03-28 MED ORDER — UMECLIDINIUM BROMIDE 62.5 MCG/ACT IN AEPB
1.0000 | INHALATION_SPRAY | Freq: Every day | RESPIRATORY_TRACT | Status: DC
  Filled 2024-03-28: qty 7

## 2024-03-28 MED ORDER — AMLODIPINE BESYLATE 5 MG PO TABS: 5.0000 mg | ORAL_TABLET | Freq: Every day | ORAL | Status: DC
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
## 2024-03-28 NOTE — ED Notes (Signed)
 TTS at the bedside with IRIS monitor for pt evaluation.
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
## 2024-03-28 NOTE — BH Assessment (Signed)
 This Clinical research associate contacted [facility] via phone to request an assessment, request has been made, assessment is currently pending

## 2024-03-28 NOTE — ED Provider Notes (Addendum)
 Emergency Medicine Observation Re-evaluation Note  Patricia Good is a 63 y.o. female, seen on rounds today.  Pt initially presented to the ED for complaints of Paranoid  Currently, the patient is is no acute distress. Denies any concerns at this time.  Physical Exam  Blood pressure (!) 157/78, pulse 86, temperature 98.7 F (37.1 C), resp. rate 18, height 5' 1 (1.549 m), weight 64.9 kg, SpO2 96%.  Physical Exam: General: No apparent distress Pulm: Normal WOB Neuro: Moving all extremities Psych: Resting comfortably     ED Course / MDM     I have reviewed the labs performed to date as well as medications administered while in observation.  Recent changes in the last 24 hours include: No acute events overnight.  Plan   Current plan: Patient awaiting psychiatric disposition.    Patricia Good, Patricia Good, Patricia Good 03/28/24 0620   6:42 AM  Patricia Good  Patricia Good, Patricia Good with psychiatry patient is currently psychiatrically cleared.  Will give [facility] follow-up.   Patricia Good, Patricia Good, Patricia Good 03/28/24 [id number]

## 2024-03-28 NOTE — ED Notes (Signed)
 Pt up to restroom. Pt given sterile cup to collect urine sample. Pt was only able to get a very small amount of urine in the cup (less than 1ml). Will attempt again.

## 2024-03-28 NOTE — ED Notes (Signed)
 Pt discharged at this time. RN reviewed discharge instructions with pt. Pt verbalized understanding. RR even and unlabored. Pt denies any questions or needs at this time. Pt ambulatory at discharge. Taxi called for pt. Pt awaiting taxi in lobby at this time.

## 2024-03-28 NOTE — BH Assessment (Signed)
 This clinical research associate provided patient with [facility] Outpatient referral, patient is receptive.

## 2024-04-05 ENCOUNTER — Telehealth: Payer: Self-pay | Admitting: Oncology

## 2024-04-05 NOTE — Telephone Encounter (Signed)
 Pt called back and stated she found a PCP and will no longer need a referral.

## 2024-04-05 NOTE — Telephone Encounter (Signed)
 Patient called and said she is considering switching to a new pcp. She asked if Dr. Melanee had any recommendations or could send a referral somewhere for her. I told her that I would ask Dr. Melanee and would give her a call back on Friday. Please advise

## 2024-04-11 ENCOUNTER — Encounter

## 2024-04-12 ENCOUNTER — Other Ambulatory Visit

## 2024-04-14 ENCOUNTER — Telehealth: Payer: Self-pay | Admitting: Oncology

## 2024-04-14 NOTE — Telephone Encounter (Signed)
 Pt ins agent called to see if we are in network with [facility] HMO plan - pt is wanting to switch to this plan - I spoke w/auth team and they said we are not in network w/that plan and to let pt know that she would have OON charges if she proceeds w/that plan and wants to continue seeing Dr Melanee - pt ins agent had more questions so transferred call to auth team - [facility]

## 2024-04-14 NOTE — Telephone Encounter (Signed)
 Pt ins agent keeps calling to try to see if we are in-network with a new coverage that she wants to change to. If he calls back, I was told that we can tell him that we aren't allowed to discuss pt info (even ins info) without the pt being on the phone as well. If he calls back, I was told to message Darlena Clark and/or Tawni Ill so they can handle that conversation. - LH

## 2024-04-14 NOTE — Telephone Encounter (Signed)
 Called pt to confirm ins change for 2/1 - asked pt to bring in new ins card and ID at next appt - [facility]

## 2024-04-17 ENCOUNTER — Encounter

## 2024-04-20 ENCOUNTER — Ambulatory Visit
Admission: RE | Admit: 2024-04-20 | Discharge: 2024-04-20 | Disposition: A | Discharge status: Home / Self Care | Source: Ambulatory Visit | Attending: Oncology | Admitting: Oncology

## 2024-04-20 DIAGNOSIS — Z853 Personal history of malignant neoplasm of breast: Secondary | ICD-10-CM | POA: Diagnosis present

## 2024-04-27 ENCOUNTER — Ambulatory Visit (INDEPENDENT_AMBULATORY_CARE_PROVIDER_SITE_OTHER): Admitting: Psychiatry

## 2024-04-27 ENCOUNTER — Other Ambulatory Visit: Payer: Self-pay | Admitting: *Deleted

## 2024-04-27 ENCOUNTER — Other Ambulatory Visit: Payer: Self-pay

## 2024-04-27 ENCOUNTER — Encounter: Payer: Self-pay | Admitting: Psychiatry

## 2024-04-27 VITALS — BP 152/81 | HR 93 | Temp 97.5°F | Ht 61.0 in | Wt 162.0 lb

## 2024-04-27 DIAGNOSIS — F4310 Post-traumatic stress disorder, unspecified: Secondary | ICD-10-CM | POA: Diagnosis not present

## 2024-04-27 DIAGNOSIS — F4010 Social phobia, unspecified: Secondary | ICD-10-CM

## 2024-04-27 DIAGNOSIS — F17200 Nicotine dependence, unspecified, uncomplicated: Secondary | ICD-10-CM | POA: Diagnosis not present

## 2024-04-27 DIAGNOSIS — F250 Schizoaffective disorder, bipolar type: Secondary | ICD-10-CM | POA: Diagnosis not present

## 2024-04-27 DIAGNOSIS — C519 Malignant neoplasm of vulva, unspecified: Secondary | ICD-10-CM

## 2024-04-27 NOTE — Telephone Encounter (Signed)
 Spoke with patient. Advised her that Dr. Melanee is recommending that Dr. Coby manage the trazodone  at this time.

## 2024-04-27 NOTE — Patient Instructions (Signed)
 [facility] [address]  [place], [place] [address] Phone- [phone]  Fax- [phone] Crisis #: [phone]  If no answer call [phone] Email Us  by CLICKING [address]: [place], [place], [place], [place], [place]

## 2024-04-27 NOTE — Telephone Encounter (Signed)
 Can you see if patient still sees psychiatry and if so can we get in touch witht hem and make sure if it is ok for us  to continue prescribing trazodone .

## 2024-04-27 NOTE — Progress Notes (Unsigned)
 BH MD OP Progress Note  04/27/2024 12:54 PM Patricia Good  MRN:  [id number]  Chief Complaint:  Chief Complaint  Patient presents with   Follow-up   Anxiety   Medication Refill   Paranoid   Discussed the use of AI scribe software for clinical note transcription with the patient, who gave verbal consent to proceed.  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  Ongoing psychosocial stressors related to housing instability continue to impact her, as she describes recently staying with an acquaintance for a weekend but leaving due to discomfort and feeling awkward living with someone she did not know. She explains that the individual did not bother her physically but took some of her food, which led to a disagreement and her acquaintance asked her to leave. Currently, she lives out of her truck and expresses uncertainty about her plans during an upcoming winter storm, mentioning she might stay with her sister for a few days.  She takes her prescribed medications daily, including olanzapine , bupropion , hydroxyzine  as needed, prazosin  , trazodone  though she occasionally forgets a dose. She states that she is not currently experiencing auditory or visual hallucinations . Sleep is generally adequate.  She however continues to be paranoid, chronic and struggles with trust issues.  She is unable to hold housing stability due to her trust issues and paranoia.  Patient with recent emergency department visit on 03/27/2024-due to delusions of  being poisoned after she drank from a bottle.  She however was discharged after her potassium level was repleted and she was medically stabilized, cleared by psychiatric and referred to [facility] .  Patient today agreeable to referral to [facility].  During session writer contacted [facility] completed a referral, patient signed release of information and the referral was faxed out.   Visit Diagnosis:    ICD-10-CM   1. Schizoaffective disorder, bipolar type (HCC)  F25.0     2. PTSD (post-traumatic stress disorder)  F43.10     3. Social anxiety disorder  F40.10     4. Tobacco use disorder  F17.200    Moderate      Past Psychiatric History: I have reviewed past psychiatric history from progress note on 07/16/2017.  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   Personal history of radiation therapy    Pre-diabetes    PTSD (post-traumatic stress disorder)    Schizoaffective disorder [facility])     Past Surgical History:  Procedure Laterality Date   BREAST BIOPSY Left 03/03/2022   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   BREAST BIOPSY Left 03/03/2022   MM LT BREAST BX W LOC DEV 1ST LESION IMAGE BX SPEC  STEREO GUIDE 03/03/2022 [facility]   BREAST LUMPECTOMY Left 03/20/2022   ECTOPIC PREGNANCY SURGERY     IR IMAGING GUIDED PORT INSERTION  11/11/2023   PART MASTECTOMY,RADIO FREQUENCY LOCALIZER,AXILLARY SENTINEL NODE BIOPSY Left 03/20/2022   Procedure: PART MASTECTOMY,RADIO FREQUENCY LOCALIZER,AXILLARY SENTINEL NODE BIOPSY;  Surgeon: Rodolph Romano, MD;  Location: [facility] ORS;  Service: General;  Laterality: Left;   TOTAL ABDOMINAL HYSTERECTOMY W/ BILATERAL SALPINGOOPHORECTOMY  1990    Family Psychiatric History: I have reviewed family psychiatric history from progress note on 07/16/2017.  Family History:  Family History  Problem Relation Age of Onset   Hypertension Father    Diabetes Father    Heart disease Father    Blindness Father    Alcohol abuse Sister    Drug abuse Sister    Anxiety disorder Sister    Depression Sister    Obesity Brother    Arthritis Brother    Alcohol abuse Brother    Drug abuse Brother    Depression Brother    Breast cancer Cousin    Clotting disorder Neg Hx     Social History: I have reviewed social history from progress note on 07/16/2017. Social History   Socioeconomic History   Marital status: Single    Spouse name: Not on file   Number of children: 0   Years of education: Not on file   Highest education level: Associate degree: occupational, scientist, product/process development, or vocational program  Occupational History   Not on file  Tobacco Use   Smoking status: Every Day    Current packs/day: 0.50    Average packs/day: 0.5 packs/day for 55.3 years (27.6 ttl pk-yrs)    Types: Cigarettes    Start date: 01/22/1969   Smokeless tobacco: Never   Tobacco comments:    Continues to go down on number per day to 5-7 per day. Tried Chantix  but stated after several weeks not helping  Vaping Use   Vaping status: Never Used  Substance and Sexual Activity   Alcohol use: Not Currently    Alcohol/week: 1.0 - 2.0 standard drink of alcohol    Types: 1 Glasses of wine per week    Comment: occasional   Drug use: No   Sexual activity: Not Currently    Birth control/protection: None  Other Topics Concern   Not on file  Social History Narrative   Lives alone   Social Drivers of Health   Tobacco Use: High Risk (04/27/2024)   Patient History    Smoking Tobacco Use: Every Day    Smokeless Tobacco Use: Never     Passive Exposure: Not on file  Financial Resource Strain: Medium Risk (08/03/2023)   Received from [facility]   Overall Financial Resource Strain (CARDIA)    Difficulty of Paying Living Expenses: Somewhat hard  Food Insecurity: Food Insecurity Present (11/15/2023)   Epic    Worried About Programme Researcher, Broadcasting/film/video in the Last Year: Sometimes true    Ran Out of Food in the Last Year: Sometimes true  Transportation Needs: No Transportation Needs (08/19/2023)   PRAPARE - Administrator, Civil Service (Medical): No    Lack of Transportation (Non-Medical): No  Physical Activity: Not on file  Stress: Not on file  Social Connections: Not on file  Depression (PHQ2-9): Medium Risk (04/27/2024)   Depression (PHQ2-9)    PHQ-2 Score: 6  Alcohol Screen: Not on file  Housing: Unknown (08/19/2023)   Housing Stability Vital Sign    Unable to Pay  for Housing in the Last Year: No    Number of Times Moved in the Last Year: Not on file    Homeless in the Last Year: No  Utilities: Not At Risk (08/19/2023)   [facility] Utilities    Threatened with loss of utilities: No  Health Literacy: Not on file    Allergies: Allergies[1]  Metabolic Disorder Labs: Lab Results  Component Value Date   HGBA1C 5.7 (H) 05/06/2020   MPG 116.89 05/06/2020   MPG 117 12/21/2017   Lab Results  Component Value Date   PROLACTIN 1.3 (L) 05/06/2020   PROLACTIN 1.2 (L) 07/12/2017   Lab Results  Component Value Date   CHOL 193 05/06/2020   TRIG 103 05/06/2020   HDL 56 05/06/2020   CHOLHDL 3.4 05/06/2020   VLDL 21 05/06/2020   LDLCALC 116 (H) 05/06/2020   LDLCALC 163 (H) 12/21/2017   Lab Results  Component Value Date   TSH 14.152 (H) 11/22/2023   TSH 2.250 06/17/2021    Therapeutic Level Labs: No results found for: LITHIUM No results found for: VALPROATE No results found for: CBMZ  Current Medications: Current Outpatient Medications  Medication Sig Dispense Refill   albuterol   (VENTOLIN  HFA) 108 (90 Base) MCG/ACT inhaler 2 puffs Inhalation every 4-6 hrs for 30 days for shortness of breath or wheezing     alendronate (FOSAMAX) 70 MG tablet Take 70 mg by mouth once a week.     amLODipine  (NORVASC ) 10 MG tablet 1 tablet Orally Once a day for 100 days for high blood pressure (Patient not taking: Reported on 03/24/2024)     amLODipine  (NORVASC ) 5 MG tablet Take 5 mg by mouth daily.     atorvastatin  (LIPITOR) 20 MG tablet Take 20 mg by mouth at bedtime.     bacitracin ointment Apply topically daily.     buPROPion  (WELLBUTRIN  XL) 150 MG 24 hr tablet TAKE 1 TABLET BY MOUTH DAILY ALONG WITH 300 MG TABLET FOR TOTAL OF 450 MG DAILY 90 tablet 1   buPROPion  (WELLBUTRIN  XL) 300 MG 24 hr tablet Take 1 tablet (300 mg total) by mouth daily. Take along with 150 mg daily , total of 450 mg daily 90 tablet 0   Calcium  Carb-Cholecalciferol 500-10 MG-MCG TABS Take 1 tablet by mouth.     calcium -vitamin D  (OSCAL WITH D) 500-200 MG-UNIT tablet Take 1 tablet by mouth daily with breakfast.     clonazePAM  (KLONOPIN ) 0.5 MG tablet TAKE 1 TABLET BY MOUTH DAILY AS NEEDED FOR ANXIETY. PLEASE USE IT ONLY FOR SEVERE PANIC ATTACKS. DO NOT COMBINE WITH OXYCODONE . 15 tablet 1   dexamethasone  (DECADRON ) 4 MG tablet Take 2 tablets daily x 3 days starting the day after chemotherapy. Take with food. 30 tablet 1   fluticasone  (FLONASE ) 50 MCG/ACT nasal spray Place into both nostrils as needed for allergies or rhinitis. (Patient not taking: Reported on 02/21/2024)     furosemide  (LASIX ) 20 MG tablet 1 tablet Orally Once a day for 100 days high blood pressure     hydrOXYzine  (ATARAX ) 10 MG tablet Take 2 tablets (20 mg total) by mouth at bedtime as needed. for sleep and anxiety 180 tablet 1   Lactulose 20 GM/30ML SOLN 15 mL Orally twice a day for 30 days as needed for constipation (Patient not taking: Reported on 02/21/2024)     letrozole  (FEMARA ) 2.5 MG tablet TAKE 1 TABLET BY MOUTH DAILY 90 tablet 3    levocetirizine (XYZAL) 5 MG tablet 1 tablet in the evening  Orally Once a day for 100 days as needed for allergy symptoms (Patient not taking: Reported on 02/21/2024)     levothyroxine  (SYNTHROID , LEVOTHROID) 112 MCG tablet      lidocaine -prilocaine  (EMLA ) cream Apply 1 Application topically as needed. Apply a small amount to port site 1 hour before chemotherapy (Patient not taking: Reported on 02/21/2024) 30 g 2   OLANZapine  (ZYPREXA ) 5 MG tablet Take 1 tablet (5 mg total) by mouth at bedtime. 90 tablet 0   olopatadine  (PATANOL) 0.1 % ophthalmic solution Place 1 drop into both eyes 2 (two) times daily. (Patient not taking: Reported on 02/21/2024) 5 mL 1   omeprazole (PRILOSEC) 40 MG capsule Take 40 mg by mouth daily.      ondansetron  (ZOFRAN ) 8 MG tablet Take 1 tablet (8 mg total) by mouth every 8hrs as needed for nausea or vomiting. Start on the third day after cisplatin . (Patient not taking: Reported on 02/21/2024) 30 tablet 1   oxyCODONE  (OXY IR/ROXICODONE ) 5 MG immediate release tablet Take 0.5-1 tablets (2.5-5 mg total) by mouth every 8 (eight) hours as needed for severe pain (pain score 7-10) (for pain unrelieved by tylenol ). (Patient not taking: Reported on 02/21/2024) 30 tablet 0   potassium chloride  SA (KLOR-CON  M) 20 MEQ tablet Take 1 tablet (20 mEq total) by mouth 2 (two) times daily. 10 tablet 0   prazosin  (MINIPRESS ) 2 MG capsule Take 1 capsule (2 mg total) by mouth at bedtime. 90 capsule 3   prochlorperazine  (COMPAZINE ) 10 MG tablet Take 1 tablet (10 mg total) by mouth every 6 (six) hours as needed for nausea or vomiting. (Patient not taking: Reported on 03/15/2024) 30 tablet 1   silver  sulfADIAZINE  (SILVADENE ) 1 % cream Apply 1 Application topically daily. 50 g 0   SPIRIVA  HANDIHALER 18 MCG CAPS Irrigate with 1 capsule as directed daily.     tiotropium (SPIRIVA ) 18 MCG inhalation capsule Place 18 mcg into inhaler and inhale daily.     traZODone  (DESYREL ) 50 MG tablet TAKE 1 TABLET BY  MOUTH AT BEDTIME 30 tablet 0   No current facility-administered medications for this visit.     Musculoskeletal: Strength & Muscle Tone: within normal limits Gait & Station: normal Patient leans: N/A  Psychiatric Specialty Exam: Review of Systems  Psychiatric/Behavioral:         Paranoia-chronic    Blood pressure (!) 152/81, pulse 93, temperature (!) 97.5 F (36.4 C), temperature source Temporal, height 5' 1 (1.549 m), weight 162 lb (73.5 kg).Body mass index is 30.61 kg/m.  General Appearance: Disheveled  Eye Contact:  Fair  Speech:  Clear and Coherent  Volume:  Normal  Mood:  Euthymic  Affect:  Appropriate  Thought Process:  Goal Directed and Descriptions of Associations: Intact  Orientation:  Full (Time, Place, and Person)  Thought Content: Delusions and Paranoid Ideation chronic, currently able to cope  Suicidal Thoughts:  No  Homicidal Thoughts:  No  Memory:  Immediate;   Fair Recent;   Fair Remote;   Fair  Judgement:  Fair  Insight:  Fair  Psychomotor Activity:  Normal  Concentration:  Concentration: Fair and Attention Span: Fair  Recall:  [employer] of Knowledge: Fair  Language: Fair  Akathisia:  No  Handed:  Right  AIMS (if indicated): done  Assets:  Communication Skills Desire for Improvement Housing Social Support  ADL's:  Intact  Cognition: WNL  Sleep:  Overall fair   Screenings: AIMS    Flowsheet Row Office Visit from 04/27/2024  in [facility] Office Visit from 03/23/2024 in [facility] Office Visit from 01/18/2024 in [facility] Office Visit from 09/28/2023 in [facility] Office Visit from 09/09/2023 in [facility]  AIMS Total Score 0 0 0 0 0   AUDIT    Flowsheet Row Admission (Discharged) from 12/22/2017 in [facility]   Alcohol Use Disorder Identification Test Final Score (AUDIT) 1   GAD-7    Flowsheet Row Office Visit from 03/23/2024 in [facility] Office Visit from 02/08/2024 in [facility] Office Visit from 01/18/2024 in [facility] Office Visit from 06/01/2023 in [facility] Office Visit from 04/15/2023 in [facility]  Total GAD-7 Score 3 10 5 4 2    PHQ2-9    Flowsheet Row Office Visit from 04/27/2024 in [facility] Office Visit from 03/24/2024 in [facility] [place] Office Visit from 03/23/2024 in [facility] Office Visit from 03/15/2024 in [facility] . [facility] Follow Up  from 02/21/2024 in [facility] [place]  PHQ-2 Total Score 2 0 2 1 0  PHQ-9 Total Score 6 -- 5 -- --   Flowsheet Row Office Visit from 04/27/2024 in [facility] ED from 03/27/2024 in [facility] Emergency Department at [facility] Visit from 03/23/2024 in [facility]  C-SSRS RISK CATEGORY No Risk No Risk No Risk     Assessment and Plan: BRYNLI OLLIS is a 64 year old Caucasian female who presented for a follow-up appointment, discussed assessment and plan as noted below.  1. Schizoaffective disorder, bipolar type (HCC)-improving Ongoing delusions/chronic paranoia although able to cope.  With recent emergency department visit in December where she was evaluated and discharged for outpatient care. Continue Olanzapine  5 mg daily Continue Wellbutrin  450 mg daily  2. PTSD (post-traumatic stress disorder)-stable Currently denies intrusive  memories flashbacks or nightmares Continue Prazosin  2 mg at bedtime Continue Trazodone  50 mg at bedtime  3. Social anxiety disorder-improving Currently denies any significant concerns although has inability to hold housing stability due to chronic mistrust and paranoia. Continue Hydroxyzine  10 to 20 mg at bedtime as needed Continue Clonazepam  0.5 mg as needed for severe panic attacks. Reviewed [place] PMP AWARxE   4. Tobacco use disorder moderate-unstable Not ready to quit    Collaboration of Care: Collaboration of Care: Other contacted [facility] team during the [phone].  Spoke to [facility], patient referral information provided.  Also completed a referral form which was faxed over to [facility].  Patient will benefit from ACT team.  Patient was advised to sign an ROI.  Reviewed records from emergency department visit dated 03/27/2024 - 03/28/2024 Dr. Josette Ward.  Patient/Guardian was advised Release of Information must be obtained prior to any record release in order to collaborate their care with an outside provider. Patient/Guardian was advised if they have not already done so to contact the registration department to sign all necessary forms in order for us  to release information regarding their care.   Consent: Patient/Guardian gives verbal consent for treatment and assignment of benefits for services provided during  this visit. Patient/Guardian expressed understanding and agreed to proceed.  I have spent atleast 40 minutes face to face with patient today which includes the time spent for preparing to see the patient ( e.g., review of test, records ), obtaining and to review and separately obtained history , ordering medications and test ,psychoeducation and supportive psychotherapy and care coordination,as well as documenting clinical information in electronic health record, as well as a referral to [facility] for higher level of care.  This note  was generated in part or whole with voice recognition software. Voice recognition is usually quite accurate but there are transcription errors that can and very often do occur. I apologize for any typographical errors that were not detected and corrected.     Marybeth Dandy, MD 04/28/2024, 6:57 AM     [1]  Allergies Allergen Reactions   Shellfish Allergy Anaphylaxis    Only to crab meat (non-imitation), not shrimp   Sulfasalazine Anaphylaxis and Hives    Other reaction(s): Other (See Comments)   Lisinopril     unknown

## 2024-04-28 MED ORDER — TRAZODONE HCL 50 MG PO TABS: 50.0000 mg | ORAL_TABLET | Freq: Every day | ORAL | 1 refills | Status: AC

## 2024-05-01 ENCOUNTER — Encounter: Payer: Self-pay | Admitting: Oncology

## 2024-05-11 ENCOUNTER — Other Ambulatory Visit: Payer: Self-pay

## 2024-05-11 ENCOUNTER — Emergency Department
Admission: EM | Admit: 2024-05-11 | Discharge: 2024-05-11 | Disposition: A | Discharge status: Other Acute Inpatient Hospital | Source: Home / Self Care | Attending: Emergency Medicine | Admitting: Emergency Medicine

## 2024-05-11 ENCOUNTER — Inpatient Hospital Stay: Admission: AD | Admit: 2024-05-11 | Source: Intra-hospital

## 2024-05-11 ENCOUNTER — Telehealth: Payer: Self-pay | Admitting: Psychiatry

## 2024-05-11 ENCOUNTER — Encounter: Payer: Self-pay | Admitting: Oncology

## 2024-05-11 DIAGNOSIS — F250 Schizoaffective disorder, bipolar type: Secondary | ICD-10-CM | POA: Diagnosis present

## 2024-05-11 DIAGNOSIS — F259 Schizoaffective disorder, unspecified: Principal | ICD-10-CM | POA: Diagnosis present

## 2024-05-11 DIAGNOSIS — F32A Depression, unspecified: Secondary | ICD-10-CM

## 2024-05-11 LAB — URINALYSIS, ROUTINE W REFLEX MICROSCOPIC
Bilirubin Urine: NEGATIVE
Glucose, UA: NEGATIVE mg/dL
Hgb urine dipstick: NEGATIVE
Ketones, ur: NEGATIVE mg/dL
Nitrite: NEGATIVE
Protein, ur: NEGATIVE mg/dL
Specific Gravity, Urine: 1.004 — ABNORMAL LOW (ref 1.005–1.030)
pH: 7 (ref 5.0–8.0)

## 2024-05-11 LAB — COMPREHENSIVE METABOLIC PANEL WITH GFR
ALT: 31 U/L (ref 0–44)
AST: 33 U/L (ref 15–41)
Albumin: 4.1 g/dL (ref 3.5–5.0)
Alkaline Phosphatase: 166 U/L — ABNORMAL HIGH (ref 38–126)
Anion gap: 10 (ref 5–15)
BUN: 16 mg/dL (ref 8–23)
CO2: 32 mmol/L (ref 22–32)
Calcium: 9.1 mg/dL (ref 8.9–10.3)
Chloride: 92 mmol/L — ABNORMAL LOW (ref 98–111)
Creatinine, Ser: 1.72 mg/dL — ABNORMAL HIGH (ref 0.44–1.00)
GFR, Estimated: 32 mL/min — ABNORMAL LOW
Glucose, Bld: 112 mg/dL — ABNORMAL HIGH (ref 70–99)
Potassium: 3.9 mmol/L (ref 3.5–5.1)
Sodium: 134 mmol/L — ABNORMAL LOW (ref 135–145)
Total Bilirubin: 0.3 mg/dL (ref 0.0–1.2)
Total Protein: 7.6 g/dL (ref 6.5–8.1)

## 2024-05-11 LAB — URINE DRUG SCREEN
Amphetamines: NEGATIVE
Barbiturates: NEGATIVE
Benzodiazepines: NEGATIVE
Cocaine: NEGATIVE
Fentanyl: NEGATIVE
Methadone Scn, Ur: NEGATIVE
Opiates: NEGATIVE
Tetrahydrocannabinol: NEGATIVE

## 2024-05-11 LAB — SARS CORONAVIRUS 2 BY RT PCR: SARS Coronavirus 2 by RT PCR: NEGATIVE

## 2024-05-11 LAB — CBC
HCT: 32.5 % — ABNORMAL LOW (ref 36.0–46.0)
Hemoglobin: 11.1 g/dL — ABNORMAL LOW (ref 12.0–15.0)
MCH: 38.4 pg — ABNORMAL HIGH (ref 26.0–34.0)
MCHC: 34.2 g/dL (ref 30.0–36.0)
MCV: 112.5 fL — ABNORMAL HIGH (ref 80.0–100.0)
Platelets: 155 10*3/uL (ref 150–400)
RBC: 2.89 MIL/uL — ABNORMAL LOW (ref 3.87–5.11)
RDW: 12.6 % (ref 11.5–15.5)
WBC: 4 10*3/uL (ref 4.0–10.5)
nRBC: 0 % (ref 0.0–0.2)

## 2024-05-11 LAB — ETHANOL: Alcohol, Ethyl (B): <15 mg/dL

## 2024-05-11 LAB — TSH: TSH: 8.52 u[IU]/mL — ABNORMAL HIGH (ref 0.350–4.500)

## 2024-05-11 LAB — VITAMIN B12: Vitamin B-12: 762 pg/mL (ref 180–914)

## 2024-05-11 MED ORDER — ACETAMINOPHEN 325 MG PO TABS: 650.0000 mg | ORAL_TABLET | Freq: Four times a day (QID) | ORAL | Status: AC | PRN

## 2024-05-11 MED ORDER — PNEUMOCOCCAL 20-VAL CONJ VACC 0.5 ML IM SUSY
0.5000 mL | PREFILLED_SYRINGE | INTRAMUSCULAR | Status: AC
  Filled 2024-05-11: qty 0.5

## 2024-05-11 MED ORDER — OLANZAPINE 5 MG PO TABS: 5.0000 mg | ORAL_TABLET | Freq: Every day | ORAL | Status: DC

## 2024-05-11 MED ORDER — VITAMIN D (CHOLECALCIFEROL) 10 MCG (400 UNIT) PO TABS
400.0000 [IU] | ORAL_TABLET | Freq: Every day | ORAL | Status: DC
  Filled 2024-05-11: qty 1

## 2024-05-11 MED ORDER — TIOTROPIUM BROMIDE MONOHYDRATE 18 MCG IN CAPS: 18.0000 ug | ORAL_CAPSULE | Freq: Every day | RESPIRATORY_TRACT | Status: DC

## 2024-05-11 MED ORDER — PRAZOSIN HCL 2 MG PO CAPS: 2.0000 mg | ORAL_CAPSULE | Freq: Every day | ORAL | Status: DC

## 2024-05-11 MED ORDER — TRAZODONE HCL 50 MG PO TABS
50.0000 mg | ORAL_TABLET | Freq: Every evening | ORAL | Status: AC | PRN
  Administered 2024-05-12: 50 mg via ORAL
  Filled 2024-05-11: qty 1

## 2024-05-11 MED ORDER — AMLODIPINE BESYLATE 5 MG PO TABS
5.0000 mg | ORAL_TABLET | Freq: Every day | ORAL | Status: AC
  Administered 2024-05-12: 5 mg via ORAL
  Filled 2024-05-11: qty 1

## 2024-05-11 MED ORDER — FUROSEMIDE 20 MG PO TABS
40.0000 mg | ORAL_TABLET | Freq: Every day | ORAL | Status: AC
  Administered 2024-05-12: 40 mg via ORAL
  Filled 2024-05-11: qty 2

## 2024-05-11 MED ORDER — HYDROXYZINE HCL 25 MG PO TABS
25.0000 mg | ORAL_TABLET | Freq: Three times a day (TID) | ORAL | Status: AC | PRN
  Administered 2024-05-12: 25 mg via ORAL
  Filled 2024-05-11: qty 1

## 2024-05-11 MED ORDER — OLANZAPINE 5 MG PO TBDP: 5.0000 mg | ORAL_TABLET | Freq: Three times a day (TID) | ORAL | Status: AC | PRN

## 2024-05-11 MED ORDER — ALUM & MAG HYDROXIDE-SIMETH 200-200-20 MG/5ML PO SUSP: 30.0000 mL | ORAL | Status: AC | PRN

## 2024-05-11 MED ORDER — LETROZOLE 2.5 MG PO TABS: 2.5000 mg | ORAL_TABLET | Freq: Every day | ORAL | Status: DC

## 2024-05-11 MED ORDER — CALCIUM CARBONATE-VITAMIN D 500-200 MG-UNIT PO TABS: 1.0000 | ORAL_TABLET | Freq: Every day | ORAL | Status: DC

## 2024-05-11 MED ORDER — CALCIUM CARBONATE 1250 (500 CA) MG PO TABS
1.0000 | ORAL_TABLET | Freq: Every day | ORAL | Status: AC
  Administered 2024-05-12: 1250 mg via ORAL
  Filled 2024-05-11 (×2): qty 1

## 2024-05-11 MED ORDER — OLANZAPINE 10 MG IM SOLR: 5.0000 mg | Freq: Three times a day (TID) | INTRAMUSCULAR | Status: AC | PRN

## 2024-05-11 MED ORDER — MAGNESIUM HYDROXIDE 400 MG/5ML PO SUSP: 30.0000 mL | Freq: Every day | ORAL | Status: AC | PRN

## 2024-05-11 MED ORDER — LEVOTHYROXINE SODIUM 112 MCG PO TABS: 112.0000 ug | ORAL_TABLET | Freq: Every day | ORAL | Status: DC

## 2024-05-11 MED ORDER — CALCIUM CARB-CHOLECALCIFEROL 500-10 MG-MCG PO TABS: 1.0000 | ORAL_TABLET | Freq: Every day | ORAL | Status: DC

## 2024-05-11 MED ORDER — UMECLIDINIUM BROMIDE 62.5 MCG/ACT IN AEPB
1.0000 | INHALATION_SPRAY | Freq: Every day | RESPIRATORY_TRACT | Status: AC
  Administered 2024-05-12: 1 via RESPIRATORY_TRACT
  Filled 2024-05-11: qty 7

## 2024-05-11 NOTE — ED Notes (Signed)
 Pt given dinner tray and beverage

## 2024-05-11 NOTE — Consult Note (Signed)
 [facility] Psychiatric Consult Initial  Patient Name: .JEFFRIE STANDER  MRN: [id number]  DOB: 07/29/1960  Consult Order details:  Orders (From admission, onward)     Start     Ordered   05/11/24 1517  CONSULT TO CALL ACT TEAM       Ordering Provider: Dorothyann Drivers, MD  Provider:  (Not yet assigned)  Question:  Reason for Consult?  Answer:  Psych consult   05/11/24 1516   05/11/24 1517  IP CONSULT TO PSYCHIATRY       Ordering Provider: Dorothyann Drivers, MD  Provider:  (Not yet assigned)  Question Answer Comment  Consult Timeframe ROUTINE - requires response within 24 hours   Reason for Consult? Consult for medication management   Contact phone number where the requesting provider can be reached [phone]      05/11/24 1516             Mode of Visit: In person    Psychiatry Consult Evaluation  Service Date: May 11, 2024 LOS:  LOS: 0 days  Chief Complaint just dont want to be here anymore  Primary Psychiatric Diagnoses    Schizoaffective disorder, bipolar type [facility])   Assessment  CLINICAL DECISION MAKING:  ISAMAR NAZIR is a 64 y.o. female .  The patient is agreeable to signing voluntary consent for inpatient psychiatric admission. Plan discussed with EDP Puduchowski.  Diagnoses:  Active Hospital problems: Active Problems:   Schizoaffective disorder, bipolar type (HCC)    Plan   ## Disposition: Patient agreeable to signing voluntary consent for inpatient psychiatric admission  ## Psychiatric Medication Recommendations: Will defer to inpatient psychiatric unit   ## Medical Decision Making Capacity: Not specifically addressed in this encounter  ## Further Work-up:  EKG- QTC: Ordered today Labs: CBC, CMP, urine drug screen and urinalysis pending, ethanol  ## Behavioral / Environmental: - No specific recommendations at this time.     ## Safety and Observation Level:  - Based on my clinical evaluation, I estimate the patient to be at low risk of  self harm in the current setting. - At this time, we recommend  routine. This decision is based on my review of the chart including patient's history and current presentation, interview of the patient, mental status examination, and consideration of suicide risk including evaluating suicidal ideation, plan, intent, suicidal or self-harm behaviors, risk factors, and protective factors. This judgment is based on our ability to directly address suicide risk, implement suicide prevention strategies, and develop a safety plan while the patient is in the clinical setting. Please contact our team if there is a concern that risk level has changed.  CSSR Risk Category:C-SSRS RISK CATEGORY:  Flowsheet Row ED from 05/11/2024 in [facility] Department at [facility] Visit from [facility]/27/2024 in [facility] ED from 03/27/2024 in [facility] Department at [facility]  C-SSRS RISK CATEGORY No Risk No Risk No Risk     Suicide Risk Assessment: Patient has following modifiable risk factors for suicide: current symptoms: anxiety/panic, insomnia, impulsivity, anhedonia, hopelessness and triggering events, which we are addressing by recommending inpatient psychiatric admission for further monitoring and stabilization.  Patient has following non-modifiable or demographic risk factors for suicide: history of suicide attempt and psychiatric hospitalization  Patient has the following protective factors against suicide: Access to outpatient mental health care  Thank you for this consult request. Recommendations have been communicated to the primary team.  We will sign off at this time.  History of Present Illness  Relevant Aspects of Hospital ED   Patient Report:   The patient presented to psychiatry today stating, I cannot take it anymore I do not want to be here any longer. She reports she has been homeless for approximately 4 months,  though a location has permitted her to park on-site and access utilities inside. She denies having local family or friends for support. The patient endorses significant feelings of hopelessness, worthlessness, and being better off dead, though she denies current suicidal ideation, intent, or plan. She denies recent self-harm or suicide attempts but reports a remote history of suicide attempts. When asked about homicidal ideation, the patient stated, I do have thoughts to hurt someone else but declined to elaborate, reporting I cannot tell you that. She denies current plan or intent to act on these thoughts. The patient denies current auditory or visual hallucinations and command hallucinations.  She reports established care with an outpatient psychiatric provider whom she sees regularly. She identifies ongoing psychosocial stressors, including a current cancer diagnosis. Her psychiatric history includes schizoaffective disorder bipolar type, PTSD, depression, and anxiety. She is currently on 8 medications but is unable to recall specific names. She reports having insurance and being able to afford medications at this time. She denies access to firearms or upcoming legal charges.  On mental status examination, the patient displayed coherent thought process. There was no evidence of mania, psychosis, or responding to internal stimuli.  Psychiatric and Social History  Psychiatric History:  Information collected from patient/chart  Prev Dx/Sx: PTSD, schizoaffective bipolar type, depression, anxiety Current Psych Provider: Dr. Coby Home Meds (current): Reported unsure of names at this time but reported takes 8 total medications daily Previous Med Trials: Unsure Therapy: Denied Prior Psych Hospitalization: Yes Prior Suicide attempt/Self Harm: Endorsed remote history Prior Violence: Denied  Family Psych History: Unsure Family Hx suicide: Unsure  Social History:  Occupational Hx: Not currently  employed  [employer] Hx: Denied Living Situation: Homeless, living out of car  Access to weapons/lethal means: Denied  Substance History Alcohol: Denied Tobacco: Endorsed smoking about a half a pack a day of cigarettes Illicit drugs: Denied Prescription drug abuse: Denied Rehab hx: Denied  Exam Findings  Physical Exam: Reviewed and agree with the physical exam findings conducted by the medical provider Vital Signs:  Temp:  [98.5 F (36.9 C)] 98.5 F (36.9 C) (02/05 1424) Pulse Rate:  [88] 88 (02/05 1424) Resp:  [18] 18 (02/05 1424) BP: (153)/(66) 153/66 (02/05 1424) SpO2:  [98 %] 98 % (02/05 1424) Weight:  [68 kg] 68 kg (02/05 1424) Blood pressure (!) 153/66, pulse 88, temperature 98.5 F (36.9 C), resp. rate 18, height 5' 2 (1.575 m), weight 68 kg, SpO2 98%. Body mass index is 27.44 kg/m.    Mental Status Exam: General Appearance: Fairly Groomed  Orientation:  Full (Time, Place, and Person)  Memory:  Fair  Concentration:  Concentration: Fair  Recall:  Fair  Attention  Fair  Eye Contact:  Minimal  Speech:  Clear and Coherent  Language:  Fair  Volume:  Normal  Mood: Depressed  Affect:  Appropriate and Congruent  Thought Process:  Coherent and Linear  Thought Content:  Logical  Suicidal Thoughts:  Yes.  without intent/plan  Homicidal Thoughts:  Yes.  without intent/plan  Judgement:  Impaired  Insight:  Fair  Psychomotor Activity:  Normal  Akathisia:  No  Fund of Knowledge:  Fair      Assets:  Communication Skills Desire for Improvement  Cognition:  WNL  ADL's:  Intact  AIMS (if indicated):        Other History   These have been pulled in through the EMR, reviewed, and updated if appropriate.  Family History:  The patient's family history includes Alcohol abuse in her brother and sister; Anxiety disorder in her sister; Arthritis in her brother; Blindness in her father; Breast cancer in her cousin; Depression in her brother and sister; Diabetes in her father;  Drug abuse in her brother and sister; Heart disease in her father; Hypertension in her father; Obesity in her brother.  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   Personal history of radiation therapy    Pre-diabetes    PTSD (post-traumatic stress disorder)    Schizoaffective disorder [facility])     Surgical History: Past Surgical History:  Procedure Laterality Date   BREAST BIOPSY Left 03/03/2022   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   BREAST BIOPSY Left 03/03/2022   MM LT BREAST BX W LOC DEV 1ST LESION IMAGE BX SPEC STEREO GUIDE 03/03/2022 [facility]   BREAST LUMPECTOMY Left 03/20/2022   ECTOPIC PREGNANCY SURGERY     IR IMAGING GUIDED PORT INSERTION  11/11/2023   PART MASTECTOMY,RADIO FREQUENCY LOCALIZER,AXILLARY SENTINEL NODE BIOPSY Left 03/20/2022   Procedure: PART MASTECTOMY,RADIO FREQUENCY LOCALIZER,AXILLARY SENTINEL NODE BIOPSY;  Surgeon: Rodolph Romano, MD;  Location: [facility] ORS;  Service: General;  Laterality: Left;   TOTAL ABDOMINAL HYSTERECTOMY W/ BILATERAL SALPINGOOPHORECTOMY  1990     Medications:  Current Medications[1]  Allergies: Allergies[2]  A. Claudene, NP This note was created using Scientist, clinical (histocompatibility and immunogenetics). Please excuse any inadvertent transcription errors. Case was discussed with supervising physician Dr. Jadapalle who is agreeable with current plan.       [1] No current facility-administered medications for this encounter.  Current Outpatient Medications:    furosemide  (LASIX ) 40 MG tablet, Take 40 mg by mouth daily., Disp: , Rfl:     albuterol  (VENTOLIN  HFA) 108 (90 Base) MCG/ACT inhaler, 2 puffs Inhalation every 4-6 hrs for 30 days for shortness of breath or wheezing, Disp: , Rfl:    alendronate (FOSAMAX) 70 MG tablet, Take 70 mg by mouth once a week., Disp: , Rfl:    amLODipine  (NORVASC ) 10 MG tablet, 1 tablet Orally Once a day for 100 days for high blood pressure (Patient not taking: Reported on 03/24/2024), Disp: , Rfl:    amLODipine  (NORVASC ) 5 MG tablet, Take 5 mg by mouth daily., Disp: , Rfl:    atorvastatin  (LIPITOR) 20 MG tablet, Take 20 mg by mouth at bedtime., Disp: , Rfl:    bacitracin ointment, Apply topically daily., Disp: , Rfl:    buPROPion  (WELLBUTRIN  XL) 150 MG 24 hr tablet, TAKE 1 TABLET BY MOUTH DAILY ALONG WITH 300 MG TABLET FOR TOTAL OF 450 MG DAILY, Disp: 90 tablet, Rfl: 1   buPROPion  (WELLBUTRIN  XL) 300 MG 24 hr tablet, Take 1 tablet (300 mg total) by mouth daily. Take along with 150 mg daily , total of 450 mg daily, Disp: 90  tablet, Rfl: 0   Calcium  Carb-Cholecalciferol  500-10 MG-MCG TABS, Take 1 tablet by mouth., Disp: , Rfl:    calcium -vitamin D  (OSCAL WITH D) 500-200 MG-UNIT tablet, Take 1 tablet by mouth daily with breakfast., Disp: , Rfl:    clonazePAM  (KLONOPIN ) 0.5 MG tablet, TAKE 1 TABLET BY MOUTH DAILY AS NEEDED FOR ANXIETY. PLEASE USE IT ONLY FOR SEVERE PANIC ATTACKS. DO NOT COMBINE WITH OXYCODONE ., Disp: 15 tablet, Rfl: 1   dexamethasone  (DECADRON ) 4 MG tablet, Take 2 tablets daily x 3 days starting the day after chemotherapy. Take with food., Disp: 30 tablet, Rfl: 1   fluticasone  (FLONASE ) 50 MCG/ACT nasal spray, Place into both nostrils as needed for allergies or rhinitis. (Patient not taking: Reported on 02/21/2024), Disp: , Rfl:    furosemide  (LASIX ) 20 MG tablet, 1 tablet Orally Once a day for 100 days high blood pressure (Patient not taking: Reported on 05/11/2024), Disp: , Rfl:    hydrOXYzine  (ATARAX ) 10 MG tablet, Take 2 tablets (20 mg total) by mouth at bedtime as needed. for sleep and  anxiety, Disp: 180 tablet, Rfl: 1   Lactulose 20 GM/30ML SOLN, 15 mL Orally twice a day for 30 days as needed for constipation (Patient not taking: Reported on 02/21/2024), Disp: , Rfl:    letrozole  (FEMARA ) 2.5 MG tablet, TAKE 1 TABLET BY MOUTH DAILY, Disp: 90 tablet, Rfl: 3   levocetirizine (XYZAL) 5 MG tablet, 1 tablet in the evening Orally Once a day for 100 days as needed for allergy symptoms (Patient not taking: Reported on 02/21/2024), Disp: , Rfl:    levothyroxine  (SYNTHROID , LEVOTHROID) 112 MCG tablet, , Disp: , Rfl:    lidocaine -prilocaine  (EMLA ) cream, Apply 1 Application topically as needed. Apply a small amount to port site 1 hour before chemotherapy (Patient not taking: Reported on 02/21/2024), Disp: 30 g, Rfl: 2   OLANZapine  (ZYPREXA ) 5 MG tablet, Take 1 tablet (5 mg total) by mouth at bedtime., Disp: 90 tablet, Rfl: 0   olopatadine  (PATANOL) 0.1 % ophthalmic solution, Place 1 drop into both eyes 2 (two) times daily. (Patient not taking: Reported on 02/21/2024), Disp: 5 mL, Rfl: 1   omeprazole (PRILOSEC) 40 MG capsule, Take 40 mg by mouth daily. , Disp: , Rfl:    ondansetron  (ZOFRAN ) 8 MG tablet, Take 1 tablet (8 mg total) by mouth every 8hrs as needed for nausea or vomiting. Start on the third day after cisplatin . (Patient not taking: Reported on 02/21/2024), Disp: 30 tablet, Rfl: 1   oxyCODONE  (OXY IR/ROXICODONE ) 5 MG immediate release tablet, Take 0.5-1 tablets (2.5-5 mg total) by mouth every 8 (eight) hours as needed for severe pain (pain score 7-10) (for pain unrelieved by tylenol ). (Patient not taking: Reported on 02/21/2024), Disp: 30 tablet, Rfl: 0   potassium chloride  SA (KLOR-CON  M) 20 MEQ tablet, Take 1 tablet (20 mEq total) by mouth 2 (two) times daily., Disp: 10 tablet, Rfl: 0   prazosin  (MINIPRESS ) 2 MG capsule, Take 1 capsule (2 mg total) by mouth at bedtime., Disp: 90 capsule, Rfl: 3   prochlorperazine  (COMPAZINE ) 10 MG tablet, Take 1 tablet (10 mg total) by mouth every 6  (six) hours as needed for nausea or vomiting. (Patient not taking: Reported on 03/15/2024), Disp: 30 tablet, Rfl: 1   silver  sulfADIAZINE  (SILVADENE ) 1 % cream, Apply 1 Application topically daily., Disp: 50 g, Rfl: 0   SPIRIVA  HANDIHALER 18 MCG CAPS, Irrigate with 1 capsule as directed daily., Disp: , Rfl:    tiotropium (SPIRIVA ) 18 MCG  inhalation capsule, Place 18 mcg into inhaler and inhale daily., Disp: , Rfl:    traZODone  (DESYREL ) 50 MG tablet, Take 1 tablet (50 mg total) by mouth at bedtime., Disp: 30 tablet, Rfl: 1 [2]  Allergies Allergen Reactions   Shellfish Allergy Anaphylaxis    Only to crab meat (non-imitation), not shrimp   Sulfasalazine Anaphylaxis and Hives    Other reaction(s): Other (See Comments)   Lisinopril     unknown

## 2024-05-11 NOTE — Plan of Care (Signed)
" °  Problem: Physical Regulation: Goal: Ability to maintain clinical measurements within normal limits will improve Outcome: Progressing   Problem: Safety: Goal: Periods of time without injury will increase Outcome: Progressing   Problem: Education: Goal: Ability to make informed decisions regarding treatment will improve Outcome: Not Progressing   Problem: Coping: Goal: Coping ability will improve Outcome: Not Progressing   "

## 2024-05-11 NOTE — Telephone Encounter (Signed)
 Called  [facility] spoke to [place] she stated that the patient called the crisis line this morning and they have the undated number for the patient

## 2024-05-11 NOTE — Tx Team (Signed)
 Initial Treatment Plan 05/11/2024 6:59 PM [name] FMW:[id number]    PATIENT STRESSORS: Financial difficulties   Health problems     PATIENT STRENGTHS: Ability for insight  Capable of independent living  Communication skills    PATIENT IDENTIFIED PROBLEMS:   Depression    Lack of support from family    Homelessness           DISCHARGE CRITERIA:  Ability to meet basic life and health needs Adequate post-discharge living arrangements Improved stabilization in mood, thinking, and/or behavior Safe-care adequate arrangements made  PRELIMINARY DISCHARGE PLAN: Outpatient therapy Placement in alternative living arrangements  PATIENT/FAMILY INVOLVEMENT: This treatment plan has been presented to and reviewed with the patient, Patricia Good. The patient has been given the opportunity to ask questions and make suggestions.  Garen CINDERELLA Daring, RN 05/11/2024, 6:59 PM

## 2024-05-11 NOTE — Progress Notes (Signed)
" °   05/11/24 2100  Psych Admission Type (Psych Patients Only)  Admission Status Voluntary  Psychosocial Assessment  Patient Complaints Depression  Eye Contact Fair  Facial Expression Flat  Affect Depressed  Speech Logical/coherent  Interaction Minimal  Motor Activity Slow;Unsteady  Appearance/Hygiene In scrubs;Unremarkable  Behavior Characteristics Cooperative;Appropriate to situation  Mood Preoccupied  Thought Process  Coherency WDL  Content WDL  Delusions None reported or observed  Perception WDL  Hallucination None reported or observed  Judgment WDL  Confusion WDL  Danger to Self  Current suicidal ideation? Denies  Agreement Not to Harm Self Yes  Description of Agreement verbal  Danger to Others  Danger to Others None reported or observed    "

## 2024-05-11 NOTE — ED Triage Notes (Signed)
 Pt to ED for mental breakdown, voices HI but states I'd rather not say who. Pt wearing sunglasses indoors, appears withdrawn. I just can deal with life right now, I'm homeless

## 2024-05-11 NOTE — ED Notes (Addendum)
 Belongings: Sunglasses Blue sweater Jeans Navy shoes Pink jacket Cellphone Blue socks Belt Keys with chain White bra Cigarettes Media Planner

## 2024-05-11 NOTE — Progress Notes (Signed)
 Patient is a 64 year old female admitted voluntarily to the [facility] Psych floor from [facility] ED after presenting for making passive suicidal statements and reports of paranoia and delusions. Patient presents to assessment via transport chair but is ambulatory with a walker. She is A+O x4. She currently denies SI/HI/AVH. I am here so that I won't hurt anyone or myself, but I feel safe. She does agree to contract for safety on the unit. Patient's affect is sad and speech is logical and coherent. Patient endorses depression 10/10 but denies anxiety. Denies pain. Patient endorses the use of a mobility aid at home and is given a walker for use on the unit. Reports last BM today 2.5.26. Patient reports smoking  pack cigs daily but declines nic replacement. She denies drug or alcohol use. Cites outpatient psychiatrist Dr. Refugia as her support system.  Skin assessment and body search completed with [place], MHT. Skin warm/dry, R chest chemo port, scratch on R breast, R big toe bruise, L breast healed scar, L leg scratch, R knee bruising. No contrabands found.   Emotional support and reassurance provided throughout admission intake. Consents signed. Afterwards, oriented patient to unit, room and call light, reviewed POC with all questions answered and concerns voiced. Patient verbalized understanding. Denies any needs at this time. Will continue to monitor with ongoing Q 15 minute safety checks per unit protocol.

## 2024-05-11 NOTE — Telephone Encounter (Signed)
 Returned call to patient.  She currently denies any suicidality or homicidality however appears to be psychotic/delusional feels somebody is out to get her, vandalizing her car.  She is going to the emergency department for an evaluation.  She was referred to [facility]  ACT team 2 weeks ago.  She changed her phone number after she was referred.  I will have staff contact [facility] to give them her updated phone number.  She will benefit from a higher level of care due to her history of noncompliance with treatment plan, being homeless due to being psychotic, she left her previous residence since she felt delusional while she was staying there and did not feel safe.

## 2024-05-11 NOTE — ED Provider Notes (Signed)
" ° °  [facility] Provider Note    Event Date/Time   First MD Initiated Contact with Patient 05/11/24 1503     (approximate)  History   Chief Complaint: Psychiatric Evaluation  HPI  Patricia Good is a 64 y.o. female with past medical history of anemia, anxiety, bipolar, COPD, hypertension, schizophrenia, presents to the emergency department for psychiatric evaluation.  According to the patient she has been homeless over the last 4 months, states she feels like she is going to have a mental breakdown.  Patient denies any drugs or alcohol.  Patient denies any medical complaints.  Patient denies to myself any SI or HI.  Physical Exam   Triage Vital Signs: ED Triage Vitals [05/11/24 1424]  Encounter Vitals Group     BP (!) 153/66     Girls Systolic BP Percentile      Girls Diastolic BP Percentile      Boys Systolic BP Percentile      Boys Diastolic BP Percentile      Pulse Rate 88     Resp 18     Temp 98.5 F (36.9 C)     Temp src      SpO2 98 %     Weight 150 lb (68 kg)     Height 5' 2 (1.575 m)     Head Circumference      Peak Flow      Pain Score 0     Pain Loc      Pain Education      Exclude from Growth Chart     Most recent vital signs: Vitals:   05/11/24 1424  BP: (!) 153/66  Pulse: 88  Resp: 18  Temp: 98.5 F (36.9 C)  SpO2: 98%    General: Awake, no distress.  CV:  Good peripheral perfusion.  Regular rate and rhythm  Resp:  Normal effort.  Equal breath sounds bilaterally.  Abd:  No distention  ED Results / Procedures / Treatments   MEDICATIONS ORDERED IN ED: Medications - No data to display   IMPRESSION / MDM / ASSESSMENT AND PLAN / ED COURSE  I reviewed the triage vital signs and the nursing notes.  Patient's presentation is most consistent with acute presentation with potential threat to life or bodily function.  Patient presents emergency department for worsening depression feels like she is going to have a mental  breakdown.  Overall the patient appears well she is calm cooperative pleasant, answers questions appropriately.  Patient states she has been going through mental health struggles and being homeless has exacerbated her symptoms.  Patient denies specifically to myself any SI or HI.  Does not appear to meet IVC criteria.  Denies any drugs or alcohol.  Patient is here voluntarily looking for help.  Will have psychiatry and TTS evaluate we will keep the patient voluntary.  Initial workup shows a reassuring CBC with chronic anemia, chemistry shows no significant finding, CKD largely unchanged from historical values.  FINAL CLINICAL IMPRESSION(S) / ED DIAGNOSES   Depression    Note:  This document was prepared using Dragon voice recognition software and may include unintentional dictation errors.   Dorothyann Drivers, MD 05/11/24 [phone]  "

## 2024-05-11 NOTE — Plan of Care (Signed)
" °  Problem: Education: Goal: Utilization of techniques to improve thought processes will improve Outcome: Progressing Goal: Knowledge of the prescribed therapeutic regimen will improve Outcome: Progressing   Problem: Education: Goal: Knowledge of [facility] [place] [facility] Education information/materials will improve Outcome: Progressing Goal: Emotional status will improve Outcome: Progressing Goal: Mental status will improve Outcome: Progressing Goal: Verbalization of understanding the information provided will improve Outcome: Progressing   Problem: Activity: Goal: Interest or engagement in activities will improve Outcome: Progressing Goal: Sleeping patterns will improve Outcome: Progressing   Problem: Coping: Goal: Ability to verbalize frustrations and anger appropriately will improve Outcome: Progressing Goal: Ability to demonstrate self-control will improve Outcome: Progressing   Problem: Health Behavior/Discharge Planning: Goal: Identification of resources available to assist in meeting health care needs will improve Outcome: Progressing Goal: Compliance with treatment plan for underlying cause of condition will improve Outcome: Progressing   Problem: Physical Regulation: Goal: Ability to maintain clinical measurements within normal limits will improve Outcome: Progressing   Problem: Safety: Goal: Periods of time without injury will increase Outcome: Progressing   Problem: Education: Goal: Ability to make informed decisions regarding treatment will improve Outcome: Progressing   Problem: Coping: Goal: Coping ability will improve Outcome: Progressing   "

## 2024-05-12 DIAGNOSIS — F250 Schizoaffective disorder, bipolar type: Principal | ICD-10-CM | POA: Insufficient documentation

## 2024-05-12 MED ORDER — OLANZAPINE 5 MG PO TABS
5.0000 mg | ORAL_TABLET | Freq: Every day | ORAL | Status: AC
  Administered 2024-05-12: 5 mg via ORAL
  Filled 2024-05-12: qty 1

## 2024-05-12 MED ORDER — VITAMIN D3 25 MCG (1000 UNIT) PO TABS
500.0000 [IU] | ORAL_TABLET | Freq: Every day | ORAL | Status: AC
  Administered 2024-05-12: 500 [IU] via ORAL
  Filled 2024-05-12: qty 1
  Filled 2024-05-12 (×2): qty 0.5

## 2024-05-12 MED ORDER — AMLODIPINE BESYLATE 5 MG PO TABS: 5.0000 mg | ORAL_TABLET | Freq: Every day | ORAL | Status: DC

## 2024-05-12 MED ORDER — LEVOTHYROXINE SODIUM 112 MCG PO TABS
112.0000 ug | ORAL_TABLET | Freq: Every day | ORAL | Status: AC
  Filled 2024-05-12: qty 1

## 2024-05-12 NOTE — Progress Notes (Signed)
 Patient quiet but responsive during interaction. Medications given as prescribed. Patient denies SI/AVH. Endorses HI but gives no additional detail. No PRNs this shift. Ongoing monitoring continues.   05/12/24 1100  Psych Admission Type (Psych Patients Only)  Admission Status Voluntary  Psychosocial Assessment  Patient Complaints Depression  Eye Contact Fair  Facial Expression Flat  Affect Depressed  Speech Logical/coherent  Interaction Minimal  Motor Activity Unsteady  Appearance/Hygiene In scrubs;Unremarkable  Behavior Characteristics Cooperative;Appropriate to situation  Mood Depressed  Thought Process  Coherency WDL  Content WDL  Delusions None reported or observed  Perception WDL  Hallucination None reported or observed  Judgment WDL  Confusion WDL  Danger to Self  Current suicidal ideation? Denies  Agreement Not to Harm Self Yes  Description of Agreement verbal  Danger to Others  Danger to Others None reported or observed

## 2024-05-12 NOTE — Group Note (Signed)
 Date:  05/12/2024 Time:  9:31 PM  Group Topic/Focus:  Overcoming Stress:   The focus of this group is to define stress and help patients assess their triggers.    Participation Level:  Active  Participation Quality:  Appropriate  Affect:  Appropriate  Cognitive:  Appropriate  Insight: Appropriate and Good  Engagement in Group:  Engaged  Modes of Intervention:  Discussion  Additional Comments:    Patricia Good 05/12/2024, 9:31 PM

## 2024-05-12 NOTE — Group Note (Signed)
 Recreation Therapy Group Note   Group Topic:Emotion Expression  Group Date: 05/12/2024 Start Time: 1400 End Time: 1450 Facilitators: Celestia Jeoffrey BRAVO, LRT, CTRS Location: Dayroom  Group Description: Patients will engage in a guided art activity focused on painting a personal peaceful place. This group encourages relaxation, self-expression, and mindfulness through creative exploration. Participants are invited to imagine a location where they feel calm, safe, or content and represent it using paint and other art materials. Emphasis is placed on the process rather than artistic skill, allowing individuals to express emotions, reduce stress, and increase self-awareness in a supportive group setting.  Goal Area(s): Promote relaxation and stress reduction Enhance emotional expression and self-awareness Support coping skills and grounding techniques Encourage creativity and positive leisure engagement [place] interaction and group participation   Affect/Mood: N/A   Participation Level: Did not attend    Clinical Observations/Individualized Feedback: Patient did not attend.  Plan: Continue to engage patient in RT group sessions 2-3x/week.   Jeoffrey BRAVO Celestia, LRT, CTRS 05/12/2024 4:42 PM
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
## 2024-05-12 NOTE — BH IP Treatment Plan (Signed)
 Interdisciplinary Treatment and Diagnostic Plan Update  05/12/2024 Time of Session: 10:15 AM  Patricia Good MRN: [id number]  Principal Diagnosis: Schizoaffective disorder [facility])  Secondary Diagnoses: Principal Problem:   Schizoaffective disorder (HCC)   Current Medications:  Current Facility-Administered Medications  Medication Dose Route Frequency Provider Last Rate Last Admin   acetaminophen  (TYLENOL ) tablet 650 mg  650 mg Oral Q6H PRN Juba, Annie, NP       alum & mag hydroxide-simeth (MAALOX/MYLANTA) 200-200-20 MG/5ML suspension 30 mL  30 mL Oral Q4H PRN Kerper, Annie, NP       amLODipine  (NORVASC ) tablet 5 mg  5 mg Oral Daily Sharps Sham, NP   5 mg at 05/12/24 9072   calcium  carbonate (OS-CAL - dosed in mg of elemental calcium ) tablet 1,250 mg  1 tablet Oral Q breakfast Jadapalle, Sree, MD   1,250 mg at 05/12/24 0749   cholecalciferol  (VITAMIN D3) tablet 500 Units  500 Units Oral Q breakfast Dail Rankin RAMAN, RPH   500 Units at 05/12/24 9250   furosemide  (LASIX ) tablet 40 mg  40 mg Oral Daily Sharps Sham, NP   40 mg at 05/12/24 9072   hydrOXYzine  (ATARAX ) tablet 25 mg  25 mg Oral TID PRN Sharps Sham, NP       magnesium  hydroxide (MILK OF MAGNESIA) suspension 30 mL  30 mL Oral Daily PRN Bayard, Annie, NP       OLANZapine  (ZYPREXA ) injection 5 mg  5 mg Intramuscular TID PRN Barca, Annie, NP       OLANZapine  zydis (ZYPREXA ) disintegrating tablet 5 mg  5 mg Oral TID PRN Sharps Sham, NP       pneumococcal 20-valent conjugate vaccine (PREVNAR 20 ) injection 0.5 mL  0.5 mL Intramuscular Tomorrow-1000 Jadapalle, Sree, MD       traZODone  (DESYREL ) tablet 50 mg  50 mg Oral QHS PRN Bradly, Annie, NP       umeclidinium bromide  (INCRUSE ELLIPTA ) 62.5 MCG/ACT 1 puff  1 puff Inhalation Daily Jadapalle, Sree, MD   1 puff at 05/12/24 0750   PTA Medications: Medications Prior to Admission  Medication Sig Dispense Refill Last Dose/Taking   albuterol  (VENTOLIN  HFA) 108 (90 Base) MCG/ACT inhaler 2 puffs  Inhalation every 4-6 hrs for 30 days for shortness of breath or wheezing (Patient not taking: Reported on 05/11/2024)      alendronate (FOSAMAX) 70 MG tablet Take 70 mg by mouth once a week. (Patient not taking: Reported on 05/11/2024)      amLODipine  (NORVASC ) 10 MG tablet 1 tablet Orally Once a day for 100 days for high blood pressure (Patient not taking: No sig reported)      amLODipine  (NORVASC ) 5 MG tablet Take 5 mg by mouth daily.      atorvastatin  (LIPITOR) 20 MG tablet Take 20 mg by mouth at bedtime. (Patient not taking: Reported on 05/11/2024)      bacitracin ointment Apply topically daily. (Patient not taking: Reported on 05/11/2024)      buPROPion  (WELLBUTRIN  XL) 150 MG 24 hr tablet TAKE 1 TABLET BY MOUTH DAILY ALONG WITH 300 MG TABLET FOR TOTAL OF 450 MG DAILY (Patient not taking: Reported on 05/11/2024) 90 tablet 1    buPROPion  (WELLBUTRIN  XL) 300 MG 24 hr tablet Take 1 tablet (300 mg total) by mouth daily. Take along with 150 mg daily , total of 450 mg daily (Patient not taking: Reported on 05/11/2024) 90 tablet 0    Calcium  Carb-Cholecalciferol  500-10 MG-MCG TABS Take 1 tablet by mouth.  calcium -vitamin D  (OSCAL WITH D) 500-200 MG-UNIT tablet Take 1 tablet by mouth daily with breakfast.      clonazePAM  (KLONOPIN ) 0.5 MG tablet TAKE 1 TABLET BY MOUTH DAILY AS NEEDED FOR ANXIETY. PLEASE USE IT ONLY FOR SEVERE PANIC ATTACKS. DO NOT COMBINE WITH OXYCODONE . (Patient not taking: Reported on 05/11/2024) 15 tablet 1    dexamethasone  (DECADRON ) 4 MG tablet Take 2 tablets daily x 3 days starting the day after chemotherapy. Take with food. (Patient not taking: Reported on 05/11/2024) 30 tablet 1    fluticasone  (FLONASE ) 50 MCG/ACT nasal spray Place into both nostrils as needed for allergies or rhinitis. (Patient not taking: No sig reported)      furosemide  (LASIX ) 20 MG tablet 1 tablet Orally Once a day for 100 days high blood pressure (Patient not taking: Reported on 05/11/2024)      furosemide  (LASIX ) 40 MG  tablet Take 40 mg by mouth daily.      hydrOXYzine  (ATARAX ) 10 MG tablet Take 2 tablets (20 mg total) by mouth at bedtime as needed. for sleep and anxiety (Patient not taking: Reported on 05/11/2024) 180 tablet 1    Lactulose 20 GM/30ML SOLN 15 mL Orally twice a day for 30 days as needed for constipation (Patient not taking: No sig reported)      letrozole  (FEMARA ) 2.5 MG tablet TAKE 1 TABLET BY MOUTH DAILY (Patient not taking: Reported on 05/11/2024) 90 tablet 3    levocetirizine (XYZAL) 5 MG tablet 1 tablet in the evening Orally Once a day for 100 days as needed for allergy symptoms (Patient not taking: No sig reported)      levothyroxine  (SYNTHROID , LEVOTHROID) 112 MCG tablet  (Patient not taking: Reported on 05/11/2024)      lidocaine -prilocaine  (EMLA ) cream Apply 1 Application topically as needed. Apply a small amount to port site 1 hour before chemotherapy (Patient not taking: No sig reported) 30 g 2    OLANZapine  (ZYPREXA ) 5 MG tablet Take 1 tablet (5 mg total) by mouth at bedtime. (Patient not taking: Reported on 05/11/2024) 90 tablet 0    olopatadine  (PATANOL) 0.1 % ophthalmic solution Place 1 drop into both eyes 2 (two) times daily. (Patient not taking: No sig reported) 5 mL 1    omeprazole (PRILOSEC) 40 MG capsule Take 40 mg by mouth daily.  (Patient not taking: Reported on 05/11/2024)      ondansetron  (ZOFRAN ) 8 MG tablet Take 1 tablet (8 mg total) by mouth every 8hrs as needed for nausea or vomiting. Start on the third day after cisplatin . (Patient not taking: No sig reported) 30 tablet 1    oxyCODONE  (OXY IR/ROXICODONE ) 5 MG immediate release tablet Take 0.5-1 tablets (2.5-5 mg total) by mouth every 8 (eight) hours as needed for severe pain (pain score 7-10) (for pain unrelieved by tylenol ). (Patient not taking: No sig reported) 30 tablet 0    potassium chloride  SA (KLOR-CON  M) 20 MEQ tablet Take 1 tablet (20 mEq total) by mouth 2 (two) times daily. (Patient not taking: Reported on 05/11/2024) 10  tablet 0    prazosin  (MINIPRESS ) 2 MG capsule Take 1 capsule (2 mg total) by mouth at bedtime. (Patient not taking: Reported on 05/11/2024) 90 capsule 3    prochlorperazine  (COMPAZINE ) 10 MG tablet Take 1 tablet (10 mg total) by mouth every 6 (six) hours as needed for nausea or vomiting. (Patient not taking: Reported on 03/15/2024) 30 tablet 1    silver  sulfADIAZINE  (SILVADENE ) 1 % cream Apply 1 Application topically  daily. (Patient not taking: Reported on 05/11/2024) 50 g 0    SPIRIVA  HANDIHALER 18 MCG CAPS Irrigate with 1 capsule as directed daily. (Patient not taking: Reported on 05/11/2024)      tiotropium (SPIRIVA ) 18 MCG inhalation capsule Place 18 mcg into inhaler and inhale daily. (Patient not taking: Reported on 05/11/2024)      traZODone  (DESYREL ) 50 MG tablet Take 1 tablet (50 mg total) by mouth at bedtime. (Patient not taking: Reported on 05/11/2024) 30 tablet 1     Patient Stressors: Financial difficulties   Health problems    Patient Strengths: Ability for insight  Capable of independent living  Communication skills   Treatment Modalities: Medication Management, Group therapy, Case management,  1 to 1 session with clinician, Psychoeducation, Recreational therapy.   Physician Treatment Plan for Primary Diagnosis: Schizoaffective disorder (HCC) Long Term Goal(s):     Short Term Goals:    Medication Management: Evaluate patient's response, side effects, and tolerance of medication regimen.  Therapeutic Interventions: 1 to 1 sessions, Unit Group sessions and Medication administration.  Evaluation of Outcomes: Not Progressing  Physician Treatment Plan for Secondary Diagnosis: Principal Problem:   Schizoaffective disorder (HCC)  Long Term Goal(s):     Short Term Goals:       Medication Management: Evaluate patient's response, side effects, and tolerance of medication regimen.  Therapeutic Interventions: 1 to 1 sessions, Unit Group sessions and Medication  administration.  Evaluation of Outcomes: Not Progressing   RN Treatment Plan for Primary Diagnosis: Schizoaffective disorder (HCC) Long Term Goal(s): Knowledge of disease and therapeutic regimen to maintain health will improve  Short Term Goals: Ability to remain free from injury will improve, Ability to verbalize frustration and anger appropriately will improve, Ability to demonstrate self-control, Ability to participate in decision making will improve, Ability to verbalize feelings will improve, Ability to disclose and discuss suicidal ideas, Ability to identify and develop effective coping behaviors will improve, and Compliance with prescribed medications will improve  Medication Management: RN will administer medications as ordered by provider, will assess and evaluate patient's response and provide education to patient for prescribed medication. RN will report any adverse and/or side effects to prescribing provider.  Therapeutic Interventions: 1 on 1 counseling sessions, Psychoeducation, Medication administration, Evaluate responses to treatment, Monitor vital signs and CBGs as ordered, Perform/monitor CIWA, COWS, AIMS and Fall Risk screenings as ordered, Perform wound care treatments as ordered.  Evaluation of Outcomes: Not Progressing   LCSW Treatment Plan for Primary Diagnosis: Schizoaffective disorder (HCC) Long Term Goal(s): Safe transition to appropriate next level of care at discharge, Engage patient in therapeutic group addressing interpersonal concerns.  Short Term Goals: Engage patient in aftercare planning with referrals and resources, Increase social support, Increase ability to appropriately verbalize feelings, Increase emotional regulation, Facilitate acceptance of mental health diagnosis and concerns, Facilitate patient progression through stages of change regarding substance use diagnoses and concerns, Identify triggers associated with mental health/substance abuse issues,  and Increase skills for wellness and recovery  Therapeutic Interventions: Assess for all discharge needs, 1 to 1 time with Social worker, Explore available resources and support systems, Assess for adequacy in community support network, Educate family and significant other(s) on suicide prevention, Complete Psychosocial Assessment, Interpersonal group therapy.  Evaluation of Outcomes: Not Progressing   Progress in Treatment: Attending groups: No. Participating in groups: No. Taking medication as prescribed: Yes. Toleration medication: Yes. Family/Significant other contact made: No, will contact:  CSW will contact if given permission  Patient understands diagnosis: Yes.  Discussing patient identified problems/goals with staff: Yes. Medical problems stabilized or resolved: Yes. Denies suicidal/homicidal ideation: No. and As evidenced by:  pt reported she wanted to harm someone  Issues/concerns per patient self-inventory: No. Other: None   New problem(s) identified: No, Describe:  None identified   New Short Term/Long Term Goal(s): elimination of symptoms of psychosis, medication management for mood stabilization; elimination of SI thoughts; development of comprehensive mental wellness plan.   Patient Goals: To rest and feel better, I don't know, just get better, get those thoughts of hurting someone out of my brain   Discharge Plan or Barriers: CSW will assist with appropriate discharge planning   Reason for Continuation of Hospitalization: Depression Hallucinations Homicidal ideation Medication stabilization  Estimated Length of Stay: 1 to 7 days  Last 3 [place] Suicide Severity Risk Score: Flowsheet Row Admission (Current) from 05/11/2024 in [facility] Most recent reading at 05/11/2024  7:43 PM ED from 05/11/2024 in [facility] Emergency Department at [facility] Most recent reading at 05/11/2024  2:26 PM Office Visit from 04/27/2024 in [facility] Most recent reading at 04/27/2024 12:59 PM  C-SSRS RISK CATEGORY Low Risk No Risk No Risk    Last PHQ 2/9 Scores:    04/27/2024   12:59 PM 03/24/2024   10:37 AM 03/23/2024    3:40 PM  Depression screen PHQ 2/9  Decreased Interest 1 0 2  Down, Depressed, Hopeless 1 0 0  PHQ - 2 Score 2 0 2  Altered sleeping 0  0  Tired, decreased energy 2  1  Change in appetite 0  1  Feeling bad or failure about yourself  0  0  Trouble concentrating 1  1  Moving slowly or fidgety/restless 1  0  Suicidal thoughts 0  0  PHQ-9 Score 6  5  Difficult doing work/chores Not difficult at all  Not difficult at all    Scribe for Treatment Team: Lum JONETTA Croft, LCSWA 05/12/2024 11:41 AM

## 2024-05-12 NOTE — [facility] Suicide Risk Assessment (Signed)
 [facility] Admission Suicide Risk Assessment   Nursing information obtained from:  Patient Demographic factors:  Age 64 or older Current Mental Status:  NA Loss Factors:  Decline in physical health, Financial problems / change in socioeconomic status Historical Factors:  NA Risk Reduction Factors:  NA  Total Time spent with patient: 30 minutes Principal Problem: Schizoaffective disorder (HCC) Diagnosis:  Principal Problem:   Schizoaffective disorder (HCC)  Subjective Data: The patient presented to psychiatry stating, I cannot take it anymore I do not want to be here any longer. She reports she has been homeless for approximately 4 months, though a location has permitted her to park on-site and access utilities inside. She denies having local family or friends for support. The patient endorses significant feelings of hopelessness, worthlessness, and being better off dead, though she denies current suicidal ideation, intent, or plan. She denies recent self-harm or suicide attempts but reports a remote history of suicide attempts. When asked about homicidal ideation, the patient stated, I do have thoughts to hurt someone else but declined to elaborate, reporting I cannot tell you that. She denies current plan or intent to act on these thoughts. The patient denies current auditory or visual hallucinations and command hallucinations. Patient is admitted to [facility] unit with Q15 min safety monitoring. Multidisciplinary team approach is offered. Medication management; group/milieu therapy is offered.   Continued Clinical Symptoms:  Alcohol Use Disorder Identification Test Final Score (AUDIT): 0 The Alcohol Use Disorders Identification Test, Guidelines for Use in Primary Care, Second Edition.  World Science Writer [facility]). Score between 0-7:  no or low risk or alcohol related problems. Score between 8-15:  moderate risk of alcohol related problems. Score between 16-19:  high risk of alcohol  related problems. Score 20 or above:  warrants further diagnostic evaluation for alcohol dependence and treatment.   CLINICAL FACTORS:   Depression:   Hopelessness   Musculoskeletal: Strength & Muscle Tone: within normal limits Gait & Station: normal Patient leans: N/A  Psychiatric Specialty Exam:  Presentation  General Appearance:  Appropriate for Environment  Eye Contact: Fair  Speech: Clear and Coherent  Speech Volume: Normal  Handedness: Right   Mood and Affect  Mood: Anxious; Depressed; Irritable  Affect: Flat; Depressed   Thought Process  Thought Processes: Coherent  Descriptions of Associations:Intact  Orientation:Full (Time, Place and Person)  Thought Content:Logical  History of Schizophrenia/Schizoaffective disorder:Yes  Duration of Psychotic Symptoms:Greater than six months  Hallucinations:Hallucinations: None  Ideas of Reference:None  Suicidal Thoughts:Suicidal Thoughts: Yes, Passive  Homicidal Thoughts:Homicidal Thoughts: Yes, Passive   Sensorium  Memory: Immediate Fair; Recent Fair  Judgment: Impaired  Insight: Shallow   Executive Functions  Concentration: Poor  Attention Span: Fair  Recall: [employer] of Knowledge: Fair  Language: Fair   Psychomotor Activity  Psychomotor Activity: Psychomotor Activity: Normal   Assets  Assets: Communication Skills; Resilience   Sleep  Sleep: Sleep: Fair    Physical Exam: Physical Exam ROS Blood pressure 128/62, pulse 86, temperature (!) 96.8 F (36 C), resp. rate 14, height 5' 2 (1.575 m), weight 69.6 kg, SpO2 94%. Body mass index is 28.08 kg/m.   COGNITIVE FEATURES THAT CONTRIBUTE TO RISK:  None    SUICIDE RISK:   Minimal: No identifiable suicidal ideation.  Patients presenting with no risk factors but with morbid ruminations; may be classified as minimal risk based on the severity of the depressive symptoms  PLAN OF CARE: Patient is admitted to  [facility] psych unit with Q15 min safety  monitoring. Multidisciplinary team approach is offered. Medication management; group/milieu therapy is offered.   I certify that inpatient services furnished can reasonably be expected to improve the patient's condition.   Faylynn Stamos, MD 05/12/2024, 3:37 PM

## 2024-05-12 NOTE — [facility] Suicide Risk Assessment (Signed)
 [facility] INPATIENT:  Family/Significant Other Suicide Prevention Education  Suicide Prevention Education:  Patient Refusal for Family/Significant Other Suicide Prevention Education: The patient Patricia Good has refused to provide written consent for family/significant other to be provided Family/Significant Other Suicide Prevention Education during admission and/or prior to discharge.  Physician notified.  Lum JONETTA Croft 05/12/2024, 11:36 AM
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
## 2024-05-12 NOTE — Plan of Care (Signed)
   Problem: Education: Goal: Emotional status will improve Outcome: Not Progressing Goal: Mental status will improve Outcome: Not Progressing

## 2024-05-12 NOTE — Group Note (Signed)
 Physical/Occupational Therapy Group Note  Group Topic: UE Therex   Group Date: 05/12/2024 Start Time: 1305 End Time: 1335 Facilitators: Clive Warren CROME, OT   Group Description: Group instructed in series of upper extremities exercises, aimed to promote strength, flexibility, range of motion and functional endurance.  Patients provided cuing for proper mechanics and proper pace of exercise; exercises adjusted as necessary for individualized patient needs.  Patient also engaged in cognitive components throughout session, working to integrate attention to task, command following, turn-taking and appropriate social interaction throughout session.  Allowed to ask questions as appropriate, and encouraged to identify specific exercises that they could complete independently outside of group sessions.  Therapeutic Goal(s):  Demonstrate appropriate performance of upper extremity exercises to promote strength, flexibility, range of motion and functional endurance Identify 2-3 specific upper extremity exercises to complete as home exercise program outside of group session  Individual Participation: Pt did not attend.  Participation Level: Did not attend   Participation Quality:   Behavior:   Speech/Thought Process:   Affect/Mood:   Insight:   Judgement:   Modes of Intervention:   Patient Response to Interventions:    Plan: Continue to engage patient in PT/OT groups 1 - 2x/week.  Conleigh Heinlein R., MPH, MS, OTR/L ascom [phone] 05/12/24, 2:27 PM

## 2024-05-12 NOTE — [facility] Counselor (Signed)
 CSW contacted pt's outpatient provider, [facility] to inquire about housing efforts as pt reports that Dr.Eappen has some information.   Dr.Eappen reports they referred pt to an ACTT team ([facility]) and they are going to be assisting pt with psyhciatry, therapy, peer support etc as they report pt needs higher level of care.   CSW contacted [facility] [phone]), they report this is the crisis number and CSW needs to contact the main line [phone])   CSW contacted the main line and spoke with Rexene. CSW got pt an intake assessment scheduled for Tuesday, 05/16/24

## 2024-05-12 NOTE — Group Note (Signed)
 Date:  05/12/2024 Time:  11:36 AM  Group Topic/Focus:  Making Healthy Choices:   The focus of this group is to help patients identify negative/unhealthy choices they were using prior to admission and identify positive/healthier coping strategies to replace them upon discharge.    Participation Level:  Did Not Attend   Patricia Good 05/12/2024, 11:36 AM

## 2024-05-23 ENCOUNTER — Ambulatory Visit: Admitting: Physician Assistant

## 2024-05-25 ENCOUNTER — Ambulatory Visit: Admitting: Psychiatry

## 2024-05-31 ENCOUNTER — Inpatient Hospital Stay: Admitting: Oncology

## 2024-05-31 ENCOUNTER — Inpatient Hospital Stay

## 2024-06-05 ENCOUNTER — Inpatient Hospital Stay: Admitting: Hospice and Palliative Medicine

## 2024-06-07 ENCOUNTER — Other Ambulatory Visit

## 2024-06-12 ENCOUNTER — Ambulatory Visit: Admitting: Radiation Oncology

## 2024-06-28 ENCOUNTER — Inpatient Hospital Stay
